# Patient Record
Sex: Female | Born: 1940 | Race: White | Hispanic: No | Marital: Married | Smoking: Never smoker
Health system: Southern US, Community
[De-identification: ages and names within clinical notes are randomized; demographics above are authoritative.]

## PROBLEM LIST (undated history)

## (undated) DIAGNOSIS — E7800 Pure hypercholesterolemia, unspecified: Secondary | ICD-10-CM

## (undated) DIAGNOSIS — Z8719 Personal history of other diseases of the digestive system: Secondary | ICD-10-CM

## (undated) DIAGNOSIS — I10 Essential (primary) hypertension: Secondary | ICD-10-CM

## (undated) DIAGNOSIS — IMO0002 Reserved for concepts with insufficient information to code with codable children: Secondary | ICD-10-CM

## (undated) DIAGNOSIS — A630 Anogenital (venereal) warts: Secondary | ICD-10-CM

## (undated) DIAGNOSIS — IMO0001 Reserved for inherently not codable concepts without codable children: Secondary | ICD-10-CM

## (undated) DIAGNOSIS — E039 Hypothyroidism, unspecified: Secondary | ICD-10-CM

## (undated) DIAGNOSIS — C50412 Malignant neoplasm of upper-outer quadrant of left female breast: Secondary | ICD-10-CM

## (undated) DIAGNOSIS — D34 Benign neoplasm of thyroid gland: Secondary | ICD-10-CM

## (undated) DIAGNOSIS — Z8489 Family history of other specified conditions: Secondary | ICD-10-CM

## (undated) DIAGNOSIS — H409 Unspecified glaucoma: Secondary | ICD-10-CM

## (undated) DIAGNOSIS — M8580 Other specified disorders of bone density and structure, unspecified site: Secondary | ICD-10-CM

## (undated) DIAGNOSIS — E559 Vitamin D deficiency, unspecified: Secondary | ICD-10-CM

## (undated) DIAGNOSIS — E785 Hyperlipidemia, unspecified: Secondary | ICD-10-CM

## (undated) DIAGNOSIS — T8859XA Other complications of anesthesia, initial encounter: Secondary | ICD-10-CM

## (undated) DIAGNOSIS — M7989 Other specified soft tissue disorders: Secondary | ICD-10-CM

## (undated) DIAGNOSIS — R7303 Prediabetes: Secondary | ICD-10-CM

## (undated) DIAGNOSIS — M899 Disorder of bone, unspecified: Secondary | ICD-10-CM

## (undated) DIAGNOSIS — M949 Disorder of cartilage, unspecified: Secondary | ICD-10-CM

## (undated) DIAGNOSIS — I4891 Unspecified atrial fibrillation: Secondary | ICD-10-CM

## (undated) DIAGNOSIS — R0602 Shortness of breath: Secondary | ICD-10-CM

## (undated) DIAGNOSIS — Z8669 Personal history of other diseases of the nervous system and sense organs: Secondary | ICD-10-CM

## (undated) DIAGNOSIS — Z923 Personal history of irradiation: Secondary | ICD-10-CM

## (undated) DIAGNOSIS — J309 Allergic rhinitis, unspecified: Secondary | ICD-10-CM

## (undated) DIAGNOSIS — Z972 Presence of dental prosthetic device (complete) (partial): Secondary | ICD-10-CM

## (undated) DIAGNOSIS — C801 Malignant (primary) neoplasm, unspecified: Secondary | ICD-10-CM

## (undated) DIAGNOSIS — D649 Anemia, unspecified: Secondary | ICD-10-CM

## (undated) DIAGNOSIS — L309 Dermatitis, unspecified: Secondary | ICD-10-CM

## (undated) DIAGNOSIS — K829 Disease of gallbladder, unspecified: Secondary | ICD-10-CM

## (undated) HISTORY — DX: Unspecified atrial fibrillation: I48.91

## (undated) HISTORY — DX: Anemia, unspecified: D64.9

## (undated) HISTORY — DX: Disease of gallbladder, unspecified: K82.9

## (undated) HISTORY — PX: OTHER SURGICAL HISTORY: SHX169

## (undated) HISTORY — DX: Pure hypercholesterolemia, unspecified: E78.00

## (undated) HISTORY — PX: DILATION AND CURETTAGE OF UTERUS: SHX78

## (undated) HISTORY — DX: Hyperlipidemia, unspecified: E78.5

## (undated) HISTORY — DX: Personal history of other diseases of the digestive system: Z87.19

## (undated) HISTORY — DX: Personal history of other diseases of the nervous system and sense organs: Z86.69

## (undated) HISTORY — PX: TUBAL LIGATION: SHX77

## (undated) HISTORY — PX: BREAST LUMPECTOMY: SHX2

## (undated) HISTORY — DX: Hypothyroidism, unspecified: E03.9

## (undated) HISTORY — DX: Allergic rhinitis, unspecified: J30.9

## (undated) HISTORY — DX: Unspecified glaucoma: H40.9

## (undated) HISTORY — DX: Shortness of breath: R06.02

## (undated) HISTORY — DX: Malignant neoplasm of upper-outer quadrant of left female breast: C50.412

## (undated) HISTORY — DX: Benign neoplasm of thyroid gland: D34

## (undated) HISTORY — DX: Vitamin D deficiency, unspecified: E55.9

## (undated) HISTORY — DX: Essential (primary) hypertension: I10

## (undated) HISTORY — DX: Disorder of cartilage, unspecified: M94.9

## (undated) HISTORY — DX: Other specified soft tissue disorders: M79.89

## (undated) HISTORY — DX: Disorder of bone, unspecified: M89.9

## (undated) HISTORY — PX: TONSILLECTOMY: SUR1361

## (undated) HISTORY — DX: Prediabetes: R73.03

## (undated) HISTORY — DX: Reserved for inherently not codable concepts without codable children: IMO0001

## (undated) HISTORY — DX: Reserved for concepts with insufficient information to code with codable children: IMO0002

---

## 1998-06-03 ENCOUNTER — Other Ambulatory Visit: Admission: RE | Admit: 1998-06-03 | Discharge: 1998-06-03 | Payer: Self-pay | Admitting: Gynecology

## 1998-11-26 HISTORY — PX: CHOLECYSTECTOMY: SHX55

## 1998-11-29 ENCOUNTER — Other Ambulatory Visit: Admission: RE | Admit: 1998-11-29 | Discharge: 1998-11-29 | Payer: Self-pay | Admitting: Gynecology

## 1999-12-27 ENCOUNTER — Encounter: Payer: Self-pay | Admitting: Gynecology

## 1999-12-27 ENCOUNTER — Encounter: Admission: RE | Admit: 1999-12-27 | Discharge: 1999-12-27 | Payer: Self-pay | Admitting: Gynecology

## 1999-12-28 ENCOUNTER — Other Ambulatory Visit: Admission: RE | Admit: 1999-12-28 | Discharge: 1999-12-28 | Payer: Self-pay | Admitting: Gynecology

## 2000-01-03 ENCOUNTER — Encounter: Admission: RE | Admit: 2000-01-03 | Discharge: 2000-01-03 | Payer: Self-pay | Admitting: Gynecology

## 2000-01-03 ENCOUNTER — Encounter: Payer: Self-pay | Admitting: Gynecology

## 2001-01-27 ENCOUNTER — Other Ambulatory Visit: Admission: RE | Admit: 2001-01-27 | Discharge: 2001-01-27 | Payer: Self-pay | Admitting: Gynecology

## 2001-02-13 ENCOUNTER — Encounter: Admission: RE | Admit: 2001-02-13 | Discharge: 2001-02-13 | Payer: Self-pay | Admitting: Gynecology

## 2001-02-13 ENCOUNTER — Encounter: Payer: Self-pay | Admitting: Gynecology

## 2002-02-02 ENCOUNTER — Other Ambulatory Visit: Admission: RE | Admit: 2002-02-02 | Discharge: 2002-02-02 | Payer: Self-pay | Admitting: Gynecology

## 2003-01-15 ENCOUNTER — Ambulatory Visit (HOSPITAL_BASED_OUTPATIENT_CLINIC_OR_DEPARTMENT_OTHER): Admission: RE | Admit: 2003-01-15 | Discharge: 2003-01-15 | Payer: Self-pay | Admitting: Gynecology

## 2003-01-15 ENCOUNTER — Encounter (INDEPENDENT_AMBULATORY_CARE_PROVIDER_SITE_OTHER): Payer: Self-pay | Admitting: Specialist

## 2003-03-18 ENCOUNTER — Encounter: Admission: RE | Admit: 2003-03-18 | Discharge: 2003-03-18 | Payer: Self-pay | Admitting: Gynecology

## 2003-03-18 ENCOUNTER — Encounter: Payer: Self-pay | Admitting: Gynecology

## 2003-09-08 ENCOUNTER — Other Ambulatory Visit: Admission: RE | Admit: 2003-09-08 | Discharge: 2003-09-08 | Payer: Self-pay | Admitting: Gynecology

## 2004-08-09 ENCOUNTER — Encounter: Admission: RE | Admit: 2004-08-09 | Discharge: 2004-08-09 | Payer: Self-pay | Admitting: Gynecology

## 2004-10-11 ENCOUNTER — Other Ambulatory Visit: Admission: RE | Admit: 2004-10-11 | Discharge: 2004-10-11 | Payer: Self-pay | Admitting: Gynecology

## 2004-12-15 ENCOUNTER — Ambulatory Visit: Payer: Self-pay | Admitting: Internal Medicine

## 2005-07-06 ENCOUNTER — Ambulatory Visit: Payer: Self-pay | Admitting: Internal Medicine

## 2005-09-07 ENCOUNTER — Ambulatory Visit: Payer: Self-pay | Admitting: Internal Medicine

## 2005-09-12 ENCOUNTER — Ambulatory Visit: Payer: Self-pay | Admitting: Internal Medicine

## 2005-09-20 ENCOUNTER — Encounter: Payer: Self-pay | Admitting: Internal Medicine

## 2005-11-07 ENCOUNTER — Encounter: Admission: RE | Admit: 2005-11-07 | Discharge: 2005-11-07 | Payer: Self-pay | Admitting: Gynecology

## 2005-11-12 ENCOUNTER — Other Ambulatory Visit: Admission: RE | Admit: 2005-11-12 | Discharge: 2005-11-12 | Payer: Self-pay | Admitting: Gynecology

## 2005-12-14 ENCOUNTER — Encounter: Admission: RE | Admit: 2005-12-14 | Discharge: 2005-12-14 | Payer: Self-pay | Admitting: Gynecology

## 2005-12-31 ENCOUNTER — Ambulatory Visit: Payer: Self-pay | Admitting: Internal Medicine

## 2006-07-10 ENCOUNTER — Ambulatory Visit: Payer: Self-pay | Admitting: Internal Medicine

## 2006-08-02 ENCOUNTER — Encounter: Admission: RE | Admit: 2006-08-02 | Discharge: 2006-08-02 | Payer: Self-pay | Admitting: Internal Medicine

## 2006-08-23 ENCOUNTER — Ambulatory Visit: Payer: Self-pay | Admitting: Internal Medicine

## 2006-11-13 ENCOUNTER — Other Ambulatory Visit: Admission: RE | Admit: 2006-11-13 | Discharge: 2006-11-13 | Payer: Self-pay | Admitting: Gynecology

## 2007-03-13 DIAGNOSIS — Z8719 Personal history of other diseases of the digestive system: Secondary | ICD-10-CM | POA: Insufficient documentation

## 2007-03-17 ENCOUNTER — Ambulatory Visit: Payer: Self-pay | Admitting: Internal Medicine

## 2007-03-17 LAB — CONVERTED CEMR LAB
BUN: 12 mg/dL (ref 6–23)
Chloride: 111 meq/L (ref 96–112)
Creatinine, Ser: 0.6 mg/dL (ref 0.4–1.2)
GFR calc Af Amer: 129 mL/min
GFR calc non Af Amer: 107 mL/min
Total CHOL/HDL Ratio: 4.6
VLDL: 22 mg/dL (ref 0–40)

## 2007-09-25 ENCOUNTER — Encounter: Admission: RE | Admit: 2007-09-25 | Discharge: 2007-09-25 | Payer: Self-pay | Admitting: Gynecology

## 2007-10-15 ENCOUNTER — Ambulatory Visit: Payer: Self-pay | Admitting: Internal Medicine

## 2007-10-15 DIAGNOSIS — I10 Essential (primary) hypertension: Secondary | ICD-10-CM

## 2007-10-15 DIAGNOSIS — R319 Hematuria, unspecified: Secondary | ICD-10-CM

## 2007-10-15 LAB — CONVERTED CEMR LAB: RBC / HPF: NONE SEEN (ref <3)

## 2007-10-16 ENCOUNTER — Encounter: Payer: Self-pay | Admitting: Internal Medicine

## 2007-10-17 ENCOUNTER — Encounter (INDEPENDENT_AMBULATORY_CARE_PROVIDER_SITE_OTHER): Payer: Self-pay | Admitting: *Deleted

## 2007-10-22 ENCOUNTER — Telehealth: Payer: Self-pay | Admitting: Internal Medicine

## 2007-10-28 ENCOUNTER — Telehealth (INDEPENDENT_AMBULATORY_CARE_PROVIDER_SITE_OTHER): Payer: Self-pay | Admitting: *Deleted

## 2007-10-29 ENCOUNTER — Ambulatory Visit: Payer: Self-pay | Admitting: Internal Medicine

## 2007-10-30 ENCOUNTER — Telehealth: Payer: Self-pay | Admitting: Internal Medicine

## 2007-10-31 ENCOUNTER — Ambulatory Visit: Payer: Self-pay | Admitting: Cardiology

## 2007-11-12 ENCOUNTER — Ambulatory Visit: Payer: Self-pay | Admitting: Internal Medicine

## 2007-11-12 LAB — CONVERTED CEMR LAB
Blood in Urine, dipstick: NEGATIVE
Nitrite: NEGATIVE
Protein, U semiquant: NEGATIVE
RBC / HPF: NONE SEEN (ref <3)
WBC, UA: NONE SEEN cells/hpf (ref <3)
pH: 6

## 2007-11-13 ENCOUNTER — Encounter: Payer: Self-pay | Admitting: Internal Medicine

## 2007-11-25 ENCOUNTER — Encounter (INDEPENDENT_AMBULATORY_CARE_PROVIDER_SITE_OTHER): Payer: Self-pay | Admitting: *Deleted

## 2007-12-19 ENCOUNTER — Ambulatory Visit: Payer: Self-pay | Admitting: Internal Medicine

## 2008-01-23 ENCOUNTER — Ambulatory Visit: Payer: Self-pay | Admitting: Internal Medicine

## 2008-01-23 ENCOUNTER — Telehealth (INDEPENDENT_AMBULATORY_CARE_PROVIDER_SITE_OTHER): Payer: Self-pay | Admitting: *Deleted

## 2008-01-26 ENCOUNTER — Emergency Department (HOSPITAL_COMMUNITY): Admission: EM | Admit: 2008-01-26 | Discharge: 2008-01-26 | Payer: Self-pay | Admitting: Family Medicine

## 2008-02-17 ENCOUNTER — Encounter: Payer: Self-pay | Admitting: Internal Medicine

## 2008-02-18 ENCOUNTER — Encounter: Payer: Self-pay | Admitting: Internal Medicine

## 2008-03-03 ENCOUNTER — Encounter: Payer: Self-pay | Admitting: Internal Medicine

## 2008-03-30 ENCOUNTER — Telehealth (INDEPENDENT_AMBULATORY_CARE_PROVIDER_SITE_OTHER): Payer: Self-pay | Admitting: *Deleted

## 2008-09-16 ENCOUNTER — Telehealth (INDEPENDENT_AMBULATORY_CARE_PROVIDER_SITE_OTHER): Payer: Self-pay | Admitting: *Deleted

## 2008-09-29 ENCOUNTER — Encounter: Admission: RE | Admit: 2008-09-29 | Discharge: 2008-09-29 | Payer: Self-pay | Admitting: Gynecology

## 2008-10-06 ENCOUNTER — Ambulatory Visit: Payer: Self-pay | Admitting: Internal Medicine

## 2009-05-09 ENCOUNTER — Telehealth (INDEPENDENT_AMBULATORY_CARE_PROVIDER_SITE_OTHER): Payer: Self-pay | Admitting: *Deleted

## 2009-05-18 ENCOUNTER — Ambulatory Visit: Payer: Self-pay | Admitting: Internal Medicine

## 2009-05-18 DIAGNOSIS — E669 Obesity, unspecified: Secondary | ICD-10-CM | POA: Insufficient documentation

## 2009-05-18 DIAGNOSIS — D126 Benign neoplasm of colon, unspecified: Secondary | ICD-10-CM

## 2009-05-31 ENCOUNTER — Telehealth (INDEPENDENT_AMBULATORY_CARE_PROVIDER_SITE_OTHER): Payer: Self-pay | Admitting: *Deleted

## 2009-06-10 ENCOUNTER — Ambulatory Visit: Payer: Self-pay | Admitting: Internal Medicine

## 2009-06-14 ENCOUNTER — Telehealth (INDEPENDENT_AMBULATORY_CARE_PROVIDER_SITE_OTHER): Payer: Self-pay | Admitting: *Deleted

## 2009-06-14 LAB — CONVERTED CEMR LAB
Basophils Absolute: 0.1 10*3/uL (ref 0.0–0.1)
Basophils Relative: 1.3 % (ref 0.0–3.0)
CO2: 28 meq/L (ref 19–32)
Chloride: 111 meq/L (ref 96–112)
Direct LDL: 155.2 mg/dL
Eosinophils Absolute: 0.1 10*3/uL (ref 0.0–0.7)
Eosinophils Relative: 1.7 % (ref 0.0–5.0)
GFR calc non Af Amer: 88.42 mL/min (ref >60)
Hemoglobin: 11 g/dL — ABNORMAL LOW (ref 12.0–15.0)
MCHC: 34.3 g/dL (ref 30.0–36.0)
MCV: 90.8 fL (ref 78.0–100.0)
Neutro Abs: 2.3 10*3/uL (ref 1.4–7.7)
Neutrophils Relative %: 54.7 % (ref 43.0–77.0)
Platelets: 157 10*3/uL (ref 150.0–400.0)
RBC: 3.54 M/uL — ABNORMAL LOW (ref 3.87–5.11)
Triglycerides: 82 mg/dL (ref 0.0–149.0)

## 2009-06-15 ENCOUNTER — Ambulatory Visit: Payer: Self-pay | Admitting: Internal Medicine

## 2009-06-22 ENCOUNTER — Telehealth: Payer: Self-pay | Admitting: Internal Medicine

## 2009-07-01 ENCOUNTER — Encounter (INDEPENDENT_AMBULATORY_CARE_PROVIDER_SITE_OTHER): Payer: Self-pay | Admitting: *Deleted

## 2009-07-01 LAB — CONVERTED CEMR LAB
Iron: 45 ug/dL (ref 42–145)
Vitamin B-12: 351 pg/mL (ref 211–911)

## 2009-07-25 ENCOUNTER — Ambulatory Visit: Payer: Self-pay | Admitting: Internal Medicine

## 2009-08-05 ENCOUNTER — Telehealth: Payer: Self-pay | Admitting: Internal Medicine

## 2009-08-08 ENCOUNTER — Telehealth: Payer: Self-pay | Admitting: Internal Medicine

## 2009-08-09 ENCOUNTER — Ambulatory Visit: Payer: Self-pay | Admitting: Internal Medicine

## 2009-08-11 ENCOUNTER — Encounter: Payer: Self-pay | Admitting: Internal Medicine

## 2009-08-11 ENCOUNTER — Encounter (INDEPENDENT_AMBULATORY_CARE_PROVIDER_SITE_OTHER): Payer: Self-pay | Admitting: *Deleted

## 2009-08-11 ENCOUNTER — Telehealth (INDEPENDENT_AMBULATORY_CARE_PROVIDER_SITE_OTHER): Payer: Self-pay | Admitting: *Deleted

## 2009-09-02 ENCOUNTER — Ambulatory Visit: Payer: Self-pay | Admitting: Family Medicine

## 2009-09-28 ENCOUNTER — Ambulatory Visit: Payer: Self-pay | Admitting: Internal Medicine

## 2009-09-28 DIAGNOSIS — M949 Disorder of cartilage, unspecified: Secondary | ICD-10-CM

## 2009-09-28 DIAGNOSIS — M899 Disorder of bone, unspecified: Secondary | ICD-10-CM | POA: Insufficient documentation

## 2009-10-03 LAB — CONVERTED CEMR LAB
AST: 28 units/L (ref 0–37)
Cholesterol: 232 mg/dL — ABNORMAL HIGH (ref 0–200)
Direct LDL: 170.7 mg/dL
HDL: 55.9 mg/dL (ref >39.00)
Total CHOL/HDL Ratio: 4
Triglycerides: 74 mg/dL (ref 0.0–149.0)
VLDL: 14.8 mg/dL (ref 0.0–40.0)

## 2009-10-04 ENCOUNTER — Encounter: Payer: Self-pay | Admitting: Internal Medicine

## 2009-10-06 ENCOUNTER — Encounter: Payer: Self-pay | Admitting: Internal Medicine

## 2009-10-06 ENCOUNTER — Ambulatory Visit: Payer: Self-pay | Admitting: Internal Medicine

## 2009-10-25 ENCOUNTER — Encounter: Admission: RE | Admit: 2009-10-25 | Discharge: 2009-10-25 | Payer: Self-pay | Admitting: Gynecology

## 2009-10-26 ENCOUNTER — Encounter: Payer: Self-pay | Admitting: Internal Medicine

## 2009-10-27 ENCOUNTER — Encounter: Payer: Self-pay | Admitting: Internal Medicine

## 2009-11-01 ENCOUNTER — Encounter (INDEPENDENT_AMBULATORY_CARE_PROVIDER_SITE_OTHER): Payer: Self-pay | Admitting: *Deleted

## 2009-11-02 ENCOUNTER — Encounter: Admission: RE | Admit: 2009-11-02 | Discharge: 2009-11-02 | Payer: Self-pay | Admitting: Gynecology

## 2009-11-24 ENCOUNTER — Telehealth: Payer: Self-pay | Admitting: Family Medicine

## 2010-07-20 ENCOUNTER — Encounter (INDEPENDENT_AMBULATORY_CARE_PROVIDER_SITE_OTHER): Payer: Self-pay | Admitting: *Deleted

## 2010-08-18 ENCOUNTER — Encounter (INDEPENDENT_AMBULATORY_CARE_PROVIDER_SITE_OTHER): Payer: Self-pay

## 2010-08-21 ENCOUNTER — Encounter (INDEPENDENT_AMBULATORY_CARE_PROVIDER_SITE_OTHER): Payer: Self-pay | Admitting: *Deleted

## 2010-08-22 ENCOUNTER — Ambulatory Visit: Payer: Self-pay | Admitting: Internal Medicine

## 2010-09-01 ENCOUNTER — Encounter: Payer: Self-pay | Admitting: Internal Medicine

## 2010-09-05 ENCOUNTER — Ambulatory Visit: Payer: Self-pay | Admitting: Internal Medicine

## 2010-09-07 ENCOUNTER — Encounter: Payer: Self-pay | Admitting: Internal Medicine

## 2010-12-24 LAB — CONVERTED CEMR LAB
ALT: 20 units/L (ref 0–35)
Albumin: 3.8 g/dL (ref 3.5–5.2)
Alkaline Phosphatase: 82 units/L (ref 39–117)
BUN: 15 mg/dL (ref 6–23)
Bilirubin Urine: NEGATIVE
Blood in Urine, dipstick: NEGATIVE
CO2: 28 meq/L (ref 19–32)
Chloride: 108 meq/L (ref 96–112)
Eosinophils Absolute: 0.1 10*3/uL (ref 0.0–0.6)
Eosinophils Relative: 1.5 % (ref 0.0–5.0)
Glucose, Bld: 102 mg/dL — ABNORMAL HIGH (ref 70–99)
Glucose, Urine, Semiquant: NEGATIVE
Glucose, Urine, Semiquant: NEGATIVE
Hemoglobin: 12.5 g/dL (ref 12.0–15.0)
Hemoglobin: 13.7 g/dL
Ketones, urine, test strip: NEGATIVE
Ketones, urine, test strip: NEGATIVE
Lymphocytes Relative: 34.8 % (ref 12.0–46.0)
MCHC: 35.2 g/dL (ref 30.0–36.0)
Monocytes Relative: 6.3 % (ref 3.0–11.0)
Platelets: 176 10*3/uL (ref 150–400)
Potassium: 5 meq/L (ref 3.5–5.1)
Protein, U semiquant: NEGATIVE
RDW: 13 % (ref 11.5–14.6)
Total Bilirubin: 0.9 mg/dL (ref 0.3–1.2)
WBC Urine, dipstick: NEGATIVE

## 2010-12-26 NOTE — Letter (Signed)
Summary: Previsit letter  [facility]  [address] [place], [place] [address]   Phone: [phone]  Fax: [phone]       07/20/2010 MRN: [id number]  [facility] [address] [place], [place]  [address]  Dear Ms. Brooke Dare,  Welcome to the Gastroenterology Division at [facility].    You are scheduled to see a nurse for your pre-procedure visit on 08/24/2010 at 4:00PM on the 3rd floor at [facility], [address]  We ask that you try to arrive at our office 15 minutes prior to your appointment time to allow for check-in.  Your nurse visit will consist of discussing your medical and surgical history, your immediate family medical history, and your medications.    Please bring a complete list of all your medications or, if you prefer, bring the medication bottles and we will list them.  We will need to be aware of both prescribed and over the counter drugs.  We will need to know exact dosage information as well.  If you are on blood thinners (Coumadin, Plavix, Aggrenox, Ticlid, etc.) please call our office today/prior to your appointment, as we need to consult with your physician about holding your medication.   Please be prepared to read and sign documents such as consent forms, a financial agreement, and acknowledgement forms.  If necessary, and with your consent, a friend or relative is welcome to sit-in on the nurse visit with you.  Please bring your insurance card so that we may make a copy of it.  If your insurance requires a referral to see a specialist, please bring your referral form from your primary care physician.  No co-pay is required for this nurse visit.     If you cannot keep your appointment, please call [phone] to cancel or reschedule prior to your appointment date.  This allows [name] the opportunity to schedule an appointment for another patient in need of care.    Thank you for choosing [facility] for your medical  needs.  We appreciate the opportunity to care for you.  Please visit [name] at our website  to learn more about our practice.                     Sincerely.                                                                                                                   The Gastroenterology Division

## 2010-12-26 NOTE — Letter (Signed)
Summary: Patient Notice- Polyp Results  [facility]  [address] [place], [place] [address]   Phone: [phone]  Fax: [phone]        September 07, 2010 MRN: [id number]    [facility] [address] [place], [place]  [address]    Dear Ms. Murata,  I am pleased to inform you that the colon polyp(s) removed during your recent colonoscopy was (were) found to be benign (no cancer detected) upon pathologic examination.  I recommend you have a repeat colonoscopy examination in 5 years to look for recurrent polyps, as having colon polyps increases your risk for having recurrent polyps or even colon cancer in the future.  Should you develop new or worsening symptoms of abdominal pain, bowel habit changes or bleeding from the rectum or bowels, please schedule an evaluation with either your primary care physician or with me.    Additional information/recommendations:  __ No further action with gastroenterology is needed at this time. Please      follow-up with your primary care physician for your other healthcare      needs.   Please call us if you are having persistent problems or have questions about your condition that have not been fully answered at this time.  Sincerely,  Hilarie Fredrickson MD  This letter has been electronically signed by your physician.  Appended Document: Patient Notice- Polyp Results letter mailed

## 2010-12-26 NOTE — Procedures (Signed)
Summary: Colonoscopy  Patient: Lori Joseph Note: All result statuses are Final unless otherwise noted.  Tests: (1) Colonoscopy (COL)   COL Colonoscopy           DONE     [facility]     [address]     [place], [place]  [address]           COLONOSCOPY PROCEDURE REPORT           PATIENT:  [name], [name]  MR#:  [id number]     BIRTHDATE:  1941/05/22, 69 yrs. old  GENDER:  female     ENDOSCOPIST:  Wilhemina Bonito. Eda Keys, MD     REF. BY:  Surveillance Program Recall,     PROCEDURE DATE:  09/05/2010     PROCEDURE:  Colonoscopy with snare polypectomy x 2     ASA CLASS:  Class II     INDICATIONS:  history of pre-cancerous (adenomatous) colon polyps,     surveillance and high-risk screening ; Negative colonoscopy 2004;     2 small adenomas 01-2008 (Dr. Noe Gens in [place] - told to follow     up now)     MEDICATIONS:   Fentanyl 100 mcg IV, Versed 10 mg IV, Benadryl 25     mg IV           DESCRIPTION OF PROCEDURE:   After the risks benefits and     alternatives of the procedure were thoroughly explained, informed     consent was obtained.  Digital rectal exam was performed and     revealed no abnormalities.   The LB 180AL [id number] endoscope was     introduced through the anus and advanced to the cecum, which was     identified by both the appendix and ileocecal valve, without     limitations.Time to cecum = 3:58 min. The quality of the prep was     excellent, using MoviPrep.  The instrument was then slowly     withdrawn (time = 10:28 min) as the colon was fully examined.     <<PROCEDUREIMAGES>>           FINDINGS:  A diminutive polyp was found in the cecum. Polyp was     snared without cautery. Retrieval was successful.   A diminutive     polyp was found in the ascending colon. Polyp was snared without     cautery. Retrieval was successful.  Moderate diverticulosis was     found in the left colon.   Retroflexed views in the rectum     revealed no abnormalities.    The scope was then  withdrawn from     the patient and the procedure completed.           COMPLICATIONS:  None           ENDOSCOPIC IMPRESSION:     1) Diminutive polyp in the cecum - removed     2) Diminutive polyp in the ascending colon - removed     3) Moderate diverticulosis in the left colon     RECOMMENDATIONS:     1) Follow up colonoscopy in 5 years           ______________________________     Wilhemina Bonito. Eda Keys, MD           CC:  Willow Ora, MD;  The Patient           n.     eSIGNED:  Wilhemina Bonito. Eda Keys at 09/05/2010 01:25 PM           Nadeen Landau, [id number]  Note: An exclamation mark (!) indicates a result that was not dispersed into the flowsheet. Document Creation Date: 09/05/2010 1:25 PM _______________________________________________________________________  (1) Order result status: Final Collection or observation date-time: 09/05/2010 13:17 Requested date-time:  Receipt date-time:  Reported date-time:  Referring Physician:   Ordering Physician: Fransico Setters [phone]) Specimen Source:  Source: Launa Grill Order Number: [id number] Lab site:   Appended Document: Colonoscopy     Procedures Next Due Date:    Colonoscopy: 08/2015

## 2010-12-26 NOTE — Miscellaneous (Signed)
Summary: flu shot @ [facility]   Immunization History:  Influenza Immunization History:    Influenza:  given @ [facility]  (08/22/2010)

## 2010-12-26 NOTE — Letter (Signed)
Summary: Moviprep Instructions  [facility]  [address]   [place], [place] [address]   Phone: [phone]  Fax: [phone]       Lori Joseph    [birth year]    MRN: [id number]        Procedure Day Dorna Bloom: Tuesday, 09-05-10     Arrival Time: 8:30 a.m.      Procedure Time: 9:30 a.m.     Location of Procedure:                     x  [facility] (4th Floor)                        PREPARATION FOR COLONOSCOPY WITH MOVIPREP   Starting 5 days prior to your procedure 08-31-10 do not eat nuts, seeds, popcorn, corn, beans, peas,  salads, or any raw vegetables.  Do not take any fiber supplements (e.g. Metamucil, Citrucel, and Benefiber).  THE DAY BEFORE YOUR PROCEDURE         DATE:  09-04-10   DAY: Monday  1.  Drink clear liquids the entire day-NO SOLID FOOD  2.  Do not drink anything colored red or purple.  Avoid juices with pulp.  No orange juice.  3.  Drink at least 64 oz. (8 glasses) of fluid/clear liquids during the day to prevent dehydration and help the prep work efficiently.  CLEAR LIQUIDS INCLUDE: Water Jello Ice Popsicles Tea (sugar ok, no milk/cream) Powdered fruit flavored drinks Coffee (sugar ok, no milk/cream) Gatorade Juice: apple, white grape, white cranberry  Lemonade Clear bullion, consomm, broth Carbonated beverages (any kind) Strained chicken noodle soup Hard Candy                             4.  In the morning, mix first dose of MoviPrep solution:    Empty 1 Pouch A and 1 Pouch B into the disposable container    Add lukewarm drinking water to the top line of the container. Mix to dissolve    Refrigerate (mixed solution should be used within 24 hrs)  5.  Begin drinking the prep at 5:00 p.m. The MoviPrep container is divided by 4 marks.   Every 15 minutes drink the solution down to the next mark (approximately 8 oz) until the full liter is complete.   6.  Follow completed prep with 16 oz of clear liquid of your choice  (Nothing red or purple).  Continue to drink clear liquids until bedtime.  7.  Before going to bed, mix second dose of MoviPrep solution:    Empty 1 Pouch A and 1 Pouch B into the disposable container    Add lukewarm drinking water to the top line of the container. Mix to dissolve    Refrigerate  THE DAY OF YOUR PROCEDURE      DATE: 09-05-10  DAY: Tuesday  Beginning at 4:30 a.m. (5 hours before procedure):         1. Every 15 minutes, drink the solution down to the next mark (approx 8 oz) until the full liter is complete.  2. Follow completed prep with 16 oz. of clear liquid of your choice.    3. You may drink clear liquids until 7:30 a.m.  (2 HOURS BEFORE PROCEDURE).   MEDICATION INSTRUCTIONS  Unless otherwise instructed, you should take regular prescription medications with a small sip of water   as early  as possible the morning of your procedure.  Diabetic patients - see separate instructions.  Stop taking Plavix or Aggrenox on  _  _  (7 days before procedure).     Stop taking Coumadin on  _ _  (5 days before procedure).  Additional medication instructions: _         OTHER INSTRUCTIONS  You will need a responsible adult at least 69 years of age to accompany you and drive you home.   This person must remain in the waiting room during your procedure.  Wear loose fitting clothing that is easily removed.  Leave jewelry and other valuables at home.  However, you may wish to bring a book to read or  an iPod/MP3 player to listen to music as you wait for your procedure to start.  Remove all body piercing jewelry and leave at home.  Total time from sign-in until discharge is approximately 2-3 hours.  You should go home directly after your procedure and rest.  You can resume normal activities the  day after your procedure.  The day of your procedure you should not:   Drive   Make legal decisions   Operate machinery   Drink alcohol   Return to work  You will  receive specific instructions about eating, activities and medications before you leave.    The above instructions have been reviewed and explained to me by   _______________________    I fully understand and can verbalize these instructions _____________________________ Date _________   Appended Document: Moviprep Instructions This prep was put in in error

## 2010-12-26 NOTE — Letter (Signed)
Summary: Moviprep Instructions  [facility]  [address]   [place], [place] [address]   Phone: [phone]  Fax: [phone]       Lori Joseph    1941-04-11    MRN: [id number]        Procedure Day Dorna Bloom: Tuesday, 09-05-10     Arrival Time: 10:30 a.m.      Procedure Time: 11:30 a.m.     Location of Procedure:                    x   [facility] (4th Floor)                        PREPARATION FOR COLONOSCOPY WITH MOVIPREP   Starting 5 days prior to your procedure 08-31-10 do not eat nuts, seeds, popcorn, corn, beans, peas,  salads, or any raw vegetables.  Do not take any fiber supplements (e.g. Metamucil, Citrucel, and Benefiber).  THE DAY BEFORE YOUR PROCEDURE         DATE: 09-04-10   DAY: Monday  1.  Drink clear liquids the entire day-NO SOLID FOOD  2.  Do not drink anything colored red or purple.  Avoid juices with pulp.  No orange juice.  3.  Drink at least 64 oz. (8 glasses) of fluid/clear liquids during the day to prevent dehydration and help the prep work efficiently.  CLEAR LIQUIDS INCLUDE: Water Jello Ice Popsicles Tea (sugar ok, no milk/cream) Powdered fruit flavored drinks Coffee (sugar ok, no milk/cream) Gatorade Juice: apple, white grape, white cranberry  Lemonade Clear bullion, consomm, broth Carbonated beverages (any kind) Strained chicken noodle soup Hard Candy                             4.  In the morning, mix first dose of MoviPrep solution:    Empty 1 Pouch A and 1 Pouch B into the disposable container    Add lukewarm drinking water to the top line of the container. Mix to dissolve    Refrigerate (mixed solution should be used within 24 hrs)  5.  Begin drinking the prep at 5:00 p.m. The MoviPrep container is divided by 4 marks.   Every 15 minutes drink the solution down to the next mark (approximately 8 oz) until the full liter is complete.   6.  Follow completed prep with 16 oz of clear liquid of your choice  (Nothing red or purple).  Continue to drink clear liquids until bedtime.  7.  Before going to bed, mix second dose of MoviPrep solution:    Empty 1 Pouch A and 1 Pouch B into the disposable container    Add lukewarm drinking water to the top line of the container. Mix to dissolve    Refrigerate  THE DAY OF YOUR PROCEDURE      DATE: 09-05-10  DAY: Tuesday  Beginning at 6:30 a.m. (5 hours before procedure):         1. Every 15 minutes, drink the solution down to the next mark (approx 8 oz) until the full liter is complete.  2. Follow completed prep with 16 oz. of clear liquid of your choice.    3. You may drink clear liquids until 9:30 a.m. (2 HOURS BEFORE PROCEDURE).   MEDICATION INSTRUCTIONS  Unless otherwise instructed, you should take regular prescription medications with a small sip of water   as early as possible  the morning of your procedure.         OTHER INSTRUCTIONS  You will need a responsible adult at least 70 years of age to accompany you and drive you home.   This person must remain in the waiting room during your procedure.  Wear loose fitting clothing that is easily removed.  Leave jewelry and other valuables at home.  However, you may wish to bring a book to read or  an iPod/MP3 player to listen to music as you wait for your procedure to start.  Remove all body piercing jewelry and leave at home.  Total time from sign-in until discharge is approximately 2-3 hours.  You should go home directly after your procedure and rest.  You can resume normal activities the  day after your procedure.  The day of your procedure you should not:   Drive   Make legal decisions   Operate machinery   Drink alcohol   Return to work  You will receive specific instructions about eating, activities and medications before you leave.    The above instructions have been reviewed and explained to me by   Ulis Rias RN  August 22, 2010 10:22 AM     I fully  understand and can verbalize these instructions _____________________________ Date _________

## 2010-12-26 NOTE — Miscellaneous (Signed)
Summary: Lec previsit  Clinical Lists Changes  Medications: Added new medication of MOVIPREP 100 GM  SOLR (PEG-KCL-NACL-NASULF-NA ASC-C) As per prep instructions. - Signed Rx of MOVIPREP 100 GM  SOLR (PEG-KCL-NACL-NASULF-NA ASC-C) As per prep instructions.;  #1 x 0;  Signed;  Entered by: Ulis Rias RN;  Authorized by: Hilarie Fredrickson MD;  Method used: Electronically to [facility]  [address] [id number] [address] [place], [place]  [address], Ph: [phone] or [phone], Fax: [phone] Observations: Added new observation of ALLERGY REV: Done (08/22/2010 9:55)    Prescriptions: MOVIPREP 100 GM  SOLR (PEG-KCL-NACL-NASULF-NA ASC-C) As per prep instructions.  #1 x 0   Entered by:   Ulis Rias RN   Authorized by:   Hilarie Fredrickson MD   Signed by:   Ulis Rias RN on 08/22/2010   Method used:   Electronically to        [facility]  [address] [id number] (retail)       [address]       [place], [place]  [address]       Ph: [phone] or [phone]       Fax: [phone]   RxID:   [id number]

## 2011-01-12 ENCOUNTER — Encounter: Payer: Self-pay | Admitting: Internal Medicine

## 2011-01-12 ENCOUNTER — Other Ambulatory Visit: Payer: Self-pay | Admitting: Internal Medicine

## 2011-01-12 ENCOUNTER — Encounter (INDEPENDENT_AMBULATORY_CARE_PROVIDER_SITE_OTHER): Payer: Medicare Other | Admitting: Internal Medicine

## 2011-01-12 DIAGNOSIS — I10 Essential (primary) hypertension: Secondary | ICD-10-CM

## 2011-01-12 DIAGNOSIS — E785 Hyperlipidemia, unspecified: Secondary | ICD-10-CM | POA: Insufficient documentation

## 2011-01-12 DIAGNOSIS — J069 Acute upper respiratory infection, unspecified: Secondary | ICD-10-CM | POA: Insufficient documentation

## 2011-01-12 DIAGNOSIS — Z0000 Encounter for general adult medical examination without abnormal findings: Secondary | ICD-10-CM

## 2011-01-12 DIAGNOSIS — M899 Disorder of bone, unspecified: Secondary | ICD-10-CM

## 2011-01-12 LAB — CBC WITH DIFFERENTIAL/PLATELET
Basophils Absolute: 0 10*3/uL (ref 0.0–0.1)
Basophils Relative: 0.5 % (ref 0.0–3.0)
Eosinophils Relative: 1.6 % (ref 0.0–5.0)
HCT: 35.9 % — ABNORMAL LOW (ref 36.0–46.0)
Lymphocytes Relative: 35.3 % (ref 12.0–46.0)
Lymphs Abs: 1.6 10*3/uL (ref 0.7–4.0)
MCV: 91.5 fl (ref 78.0–100.0)
Monocytes Relative: 5.9 % (ref 3.0–12.0)
Neutro Abs: 2.5 10*3/uL (ref 1.4–7.7)
Platelets: 161 10*3/uL (ref 150.0–400.0)
WBC: 4.5 10*3/uL (ref 4.5–10.5)

## 2011-01-12 LAB — CONVERTED CEMR LAB: Vit D, 25-Hydroxy: 36 ng/mL (ref 30–89)

## 2011-01-12 LAB — LIPID PANEL: VLDL: 26.2 mg/dL (ref 0.0–40.0)

## 2011-01-12 LAB — ALT: ALT: 19 U/L (ref 0–35)

## 2011-01-12 LAB — AST: AST: 20 U/L (ref 0–37)

## 2011-01-12 LAB — BASIC METABOLIC PANEL
BUN: 18 mg/dL (ref 6–23)
CO2: 29 mEq/L (ref 19–32)
Chloride: 108 mEq/L (ref 96–112)
Glucose, Bld: 96 mg/dL (ref 70–99)
Potassium: 4.6 mEq/L (ref 3.5–5.1)

## 2011-01-17 NOTE — Assessment & Plan Note (Signed)
Summary: cpx, shingles shot/cbs   Vital Signs:  Patient profile:   70 year old female Height:      64 inches Weight:      228 pounds BMI:     39.28 Temp:     98.4 degrees F oral Pulse rate:   82 / minute Pulse rhythm:   regular BP sitting:   130 / 88  (left arm) Cuff size:   large  Vitals Entered By: Army Fossa CMA (January 12, 2011 8:42 AM) CC: CPX, fasting  Comments woke up this am w/ a sore throat, head congestion [facility] [address] not had mammo or pap- schedule w/ a gyn     History of Present Illness: Here for Medicare AWV:  1.   Risk factors based on Past M, S, F history: reviewed 2.   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   Depression/mood: no problems noted or reported  4.   Hearing: no problems noted or reported  5.   ADL's: independent  6.   Fall Risk: no recent falls  7.   Home Safety: does feel safe at home  8.   Height, weight, &visual acuity: see VS, sees ophtalmology q 4 months , has several eye issues  9.   Counseling: yes 10.   Labs ordered based on risk factors: yes 11.           Referral Coordination, if needed 12.           Care Plan, see assessment and plan 13.            Cognitive Assessment:   motor skills, cognition and memory seem normal  in addition, we discussed the following  Hypertension-- good medication compliance , good ambulatory BPs   used to see Dr Margo Aye   yearly d/t FH skin cancer --- likes a referal to a different dermatologist, has a lesion in the abdomen that she likes to be looked at   woke up this am w/ a sore throat, head congestion, postnasal dripping with yellow phlegm    Preventive Screening-Counseling & Management  Caffeine-Diet-Exercise     Does Patient Exercise: no  Current Medications (verified): 1)  Aspirin 81 Mg  Chew (Aspirin) 2)  Cosopt 2-0.5 % Soln (Dorzolamide-Timolol) .Marland Kitchen.. 1 Gtt Ou Two Times A Day 3)  Benicar 40 Mg Tabs (Olmesartan Medoxomil) .Marland Kitchen.. 1 By Mouth Once  Daily 4)  Lumigan 0.01 % Soln (Bimatoprost) .Marland Kitchen.. 1 Drop Both Eyes At Bedtime.  Allergies (verified): 1)  ! Codeine 2)  ! * Lisinopril 3)  ! Losartan Potassium (Losartan Potassium) 4)  ! Coreg 5)  ! 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 CV:  Denies chest pain or discomfort and palpitations. Resp:  Denies cough and wheezing. GI:  Denies bloody stools, diarrhea, nausea, and vomiting. GU:  Denies dysuria and hematuria.  Physical Exam  General:  alert, well-developed, and overweight-appearing.   Head:  face symmetric, not tender to palpation Ears:  R ear normal and L ear normal.   Nose:  slightly congested Mouth:  slightly red, no discharge. Uvula midline Neck:  no masses and no thyromegaly.   Lungs:  normal respiratory effort, no intercostal retractions, no accessory muscle use, and normal breath sounds.   Heart:  normal rate, regular rhythm, no murmur, and no gallop.   Abdomen:  soft, non-tender, no distention, no masses, no guarding, and no rigidity.   Extremities:  no pretibial edema bilaterally  Skin:  has a 3x2 mm skin lesion in the mid abdomen, is salmon color, slightly elevated. Psych:  Oriented X3, good eye contact, not anxious  appearing, and not depressed appearing.     Impression & Recommendations:  Problem # 1:  HEALTH SCREENING (ICD-V70.0) Td 2008 pneumonia shot 2008 Shingles immunization---->  prescription provided had a Flu shot already   due to see  Dr. [name] and to have a MMG , had a breast aspiration last year per pt --- strongly rec to schedule F/Us  diet-exercise discussed   Cscope @ [facility] 01-2008 2 polyps ----> tubular adenomas w/  low grade dysplasia colonoscopy 08/2010, very small polyps, next 5 years  has a family history of melanoma, likes to switch to another dermatologist, lesion  in the abdomen with no worrisome features. Referred to Dr. Terri Piedra    Orders: Dermatology Referral [facility]) Medicare -1st Annual Wellness Visit [phone])  Problem # 2:  OSTEOPENIA (ICD-733.90) DEXA: 2006, T score -1.2 osteopenia DEXA 09-2009 normal Vitamin D is slightly low 09-2009 plan:  exercise, recheck a vitamin D   Orders: Venipuncture (11914) T-Vitamin D (25-Hydroxy) (78295-62130)  Problem # 3:  URI (ICD-465.9) Assessment: New    strep test negative, instructions  Orders: Rapid Strep (86578)  Problem # 4:  HYPERLIPIDEMIA (ICD-272.4) elevated LDL to 170. her brother had MI at a  early age although he reportedly  "did drugs" and patient thinks MI related to that issue  Labs EKG normal, no change from previous EKG diet, exercise encouraged LDL goal 130 or less   Labs Reviewed: SGOT: 28 (09/28/2009)   SGPT: 30 (09/28/2009)   HDL:55.90 (09/28/2009), 52.20 (06/10/2009)  LDL:DEL (03/17/2007)  Chol:232 (09/28/2009), 218 (06/10/2009)  Trig:74.0 (09/28/2009), 82.0 (06/10/2009)  Orders: TLB-Lipid Panel (80061-LIPID) TLB-ALT (SGPT) (84460-ALT) TLB-AST (SGOT) (84450-SGOT) EKG w/ Interpretation (93000)  Problem # 5:  HYPERTENSION (ICD-401.9)  at goal  Her updated medication list for this problem includes:    Benicar 40 Mg Tabs (Olmesartan medoxomil) .Marland Kitchen... 1 by mouth once daily    BP  today: 130/88 Prior BP: 156/90 (09/28/2009)  Labs Reviewed: K+: 4.4 (06/10/2009) Creat: : 0.7 (06/10/2009)   Chol: 232 (09/28/2009)   HDL: 55.90 (09/28/2009)   LDL: DEL (03/17/2007)   TG: 74.0 (09/28/2009)  Orders: TLB-BMP (Basic Metabolic Panel-BMET) (80048-METABOL) TLB-CBC Platelet - w/Differential (85025-CBCD) EKG w/ Interpretation (93000) Specimen Handling (46962)  Complete Medication List: 1)  Aspirin 81 Mg Chew (Aspirin) 2)  Cosopt 2-0.5 % Soln (Dorzolamide-timolol) .Marland Kitchen.. 1 gtt ou two times a day 3)  Lumigan 0.01 % Soln (Bimatoprost) .Marland Kitchen.. 1 drop both eyes at bedtime. 4)  Benicar 40 Mg Tabs (Olmesartan medoxomil) .Marland Kitchen.. 1 by mouth once daily 5)  Zostavax 95284 Unt/0.85ml Solr (Zoster vaccine live) .Marland Kitchen.. 1 s.q.  Patient Instructions: 1)  rest, fluids, Tylenol 2)  If you develop cough, take Robitussin-DM. 3)  Call if not better in a few days. 4)  Please schedule a follow-up appointment in 6 months . fasting, regular checkup. Prescriptions: ZOSTAVAX 04540 UNT/0.65ML SOLR (ZOSTER VACCINE LIVE) 1 s.q.  #1 x 0   Entered and Authorized by:   Nolon Rod. Paz MD   Signed by:   Nolon Rod. Paz MD on 01/12/2011   Method used:   Print then Give to Patient   RxID:   [id number]    Orders Added: 1)  Venipuncture [36415] 2)  T-Vitamin D (25-Hydroxy) [08657-84696] 3)  TLB-Lipid Panel [80061-LIPID] 4)  TLB-ALT (SGPT) [84460-ALT] 5)  TLB-AST (SGOT) [84450-SGOT] 6)  TLB-BMP (Basic Metabolic Panel-BMET) [80048-METABOL] 7)  TLB-CBC Platelet - w/Differential [85025-CBCD] 8)  EKG w/ Interpretation [93000] 9)  Rapid Strep [29528] 10)  EKG w/ Interpretation [93000] 11)  Specimen Handling [99000] 12)  Dermatology Referral [Derma] 13)  Est. Patient Level III [41324] 14)  Medicare -1st Annual Wellness Visit [G0438]      Risk Factors:  Exercise:  no   Laboratory Results    Other Tests  Rapid Strep: negative

## 2011-02-10 ENCOUNTER — Encounter: Payer: Self-pay | Admitting: Family Medicine

## 2011-02-10 ENCOUNTER — Ambulatory Visit (INDEPENDENT_AMBULATORY_CARE_PROVIDER_SITE_OTHER): Payer: Medicare Other | Admitting: Family Medicine

## 2011-02-10 DIAGNOSIS — K1370 Unspecified lesions of oral mucosa: Secondary | ICD-10-CM | POA: Insufficient documentation

## 2011-02-10 DIAGNOSIS — J019 Acute sinusitis, unspecified: Secondary | ICD-10-CM

## 2011-02-13 NOTE — Assessment & Plan Note (Signed)
Summary: SORE IN TOP OF MOUTH/RCD   Vital Signs:  Patient profile:   70 year old female Weight:      226.75 pounds Temp:     98.1 degrees F oral Pulse rate:   68 / minute Pulse rhythm:   regular BP sitting:   148 / 88  (left arm) Cuff size:   large  Vitals Entered By: Selena Batten Dance CMA (AAMA) (February 10, 2011 10:02 AM) CC: sore in top of mouth, URI symptoms   History of Present Illness:       This is a 70 year old woman who presents with URI symptoms.  The symptoms began 3 weeks ago.  pt using mucinex with no relief.  She also has a sore in her mouth.  The patient complains of nasal congestion, purulent nasal discharge, sore throat, productive cough, and sick contacts.  The patient denies fever, low-grade fever (<100.5 degrees), fever of 100.5-103 degrees, fever of 103.1-104 degrees, fever to >104 degrees, stiff neck, dyspnea, wheezing, rash, vomiting, diarrhea, use of an antipyretic, and response to antipyretic.  The patient also reports headache.  The patient denies itchy watery eyes, itchy throat, sneezing, seasonal symptoms, response to antihistamine, muscle aches, and severe fatigue.  The patient denies the following risk factors for Strep sinusitis: unilateral facial pain, unilateral nasal discharge, poor response to decongestant, double sickening, tooth pain, Strep exposure, tender adenopathy, and absence of cough.    Current Medications (verified): 1)  Aspirin 81 Mg  Chew (Aspirin) 2)  Cosopt 2-0.5 % Soln (Dorzolamide-Timolol) .Marland Kitchen.. 1 Gtt Ou Two Times A Day 3)  Lumigan 0.01 % Soln (Bimatoprost) .Marland Kitchen.. 1 Drop Both Eyes At Bedtime. 4)  Benicar 40 Mg Tabs (Olmesartan Medoxomil) .Marland Kitchen.. 1 By Mouth Once Daily 5)  Zostavax 16109 Unt/0.4ml Solr (Zoster Vaccine Live) .Marland Kitchen.. 1 S.q.  Allergies: 1)  ! Codeine 2)  ! * Lisinopril 3)  ! Losartan Potassium (Losartan Potassium) 4)  ! Coreg 5)  ! 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 Pancreatitis, hx of Hypertension sees dermatology yearly d/t FH skin cancer  h/o retinal detachment , sees opht q 4 months  Hyperlipidemia  Past Surgical History: Last updated: 10/15/2007 Tubal ligation Cholecystectomy  Family History: Last updated: 01/12/2011 Breast Ca - sister colon ca-- no Lung Ca - Brother Melanoma - sister MI-- brother at age 19 ("he did drugs") DM--no stroke-- sister   Social History: Last updated: 01/12/2011 Married  Psychologist, 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 Breast Ca - sister colon ca-- no Lung Ca - Brother Melanoma - sister MI-- brother at age 39 ("he did drugs") DM--no stroke-- sister   Social History: Reviewed history from 01/12/2011 and no changes required. 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  Otoscopic examination reveals clear canals, tympanic membranes are intact bilaterally without bulging, retraction, inflammation or discharge. Hearing is grossly normal bilaterally. Nose:  mucosal  erythema, mucosal edema, L frontal sinus tenderness, L maxillary sinus tenderness, R frontal sinus tenderness, and R maxillary sinus tenderness.   Mouth:  + irritation and errythema front of roof of mouth no drainage throat clear Neck:  No deformities, masses, or tenderness noted. Lungs:  Normal respiratory effort, chest  expands symmetrically. Lungs are clear to auscultation, no crackles or wheezes. Heart:  normal rate and no murmur.   Psych:  Oriented X3 and normally interactive.     Impression & Recommendations:  Problem # 1:  SINUSITIS - ACUTE-NOS (ICD-461.9)  Her updated medication list for this problem includes:    Ceftin 500 Mg Tabs (Cefuroxime axetil) .Marland Kitchen... 1 by mouth two times a day    con't with mucinex  Instructed on treatment. Call if symptoms persist or worsen.   Problem # 2:  OTHER&UNSPECIFIED DISEASES THE ORAL SOFT TISSUES (ICD-528.9)     finish abx if symptoms do not improve f/u dentist  Complete Medication List: 1)  Aspirin 81 Mg Chew (Aspirin) 2)  Cosopt 2-0.5 % Soln (Dorzolamide-timolol) .Marland Kitchen.. 1 gtt ou two times a day 3)  Lumigan 0.01 % Soln (Bimatoprost) .Marland Kitchen.. 1 drop both eyes at bedtime. 4)  Benicar 40 Mg Tabs (Olmesartan medoxomil) .Marland Kitchen.. 1 by mouth once daily 5)  Zostavax 16109 Unt/0.52ml Solr (Zoster vaccine live) .Marland Kitchen.. 1 s.q. 6)  Ceftin 500 Mg Tabs (Cefuroxime axetil) .Marland Kitchen.. 1 by mouth two times a day Prescriptions: CEFTIN 500 MG TABS (CEFUROXIME AXETIL) 1 by mouth two times a day  #20 x 0   Entered and Authorized by:   Loreen Freud DO   Signed by:   Loreen Freud DO on 02/10/2011   Method used:   [facility]  [address] [id number]       [address]       [place], [place]  [address]       Ph: [phone] or [phone]       Fax: [phone]   RxID:   [id number]    Orders Added: 1)  Est. Patient Level III [03474]    Current Allergies (reviewed today): ! CODEINE ! * LISINOPRIL ! LOSARTAN POTASSIUM (LOSARTAN POTASSIUM) ! COREG ! * FELODIPINE

## 2011-02-20 ENCOUNTER — Other Ambulatory Visit: Payer: Self-pay | Admitting: Dermatology

## 2011-04-10 NOTE — Assessment & Plan Note (Signed)
[facility]                         GASTROENTEROLOGY OFFICE NOTE   Lori Joseph, Lori Joseph                         MRN:          [id number]  DATE:12/19/2007                            DOB:          1941/09/29    REFERRING PHYSICIAN:  Willow Ora, MD   REASON FOR EVALUATION:  Right lower quadrant pain.   HISTORY:  This is a 70 year old white female with a history of  hypertension, biliary pancreatitis, status post cholecystectomy, and  obesity.  She is referred through the courtesy of Dr. Drue Joseph regarding  right lower quadrant pain.  The patient reports to me a several history  of right lower quadrant discomfort, really more on the right side.  She  states the discomfort was initially so severe that she had trouble  walking.  The discomfort is exacerbated with movements.  She was  initially diagnosed with a urinary tract infection.  This was treated  with no significant improvement in her side pain.  She saw her  gynecologist and had a negative workup, including ultrasound.  She saw  Dr. Drue Joseph, who ordered a CT scan, which was reported as negative.  She was  felt to have musculoskeletal pain and was treated with Flexeril and  Mobic.  She states these medications were quite helpful.  Her discomfort  has improved significantly.  She denies nausea or vomiting, dysphagia,  heartburn, upper abdominal pain, change in bowel habits, melena,  hematochezia, or weight loss.  Her discomfort was never affected by  meals or bowel movements.  She did undergo colonoscopy in 2004.  This  revealed a few scattered diverticula in the ascending and sigmoid colon  regions.  No other abnormalities.  She does have a sister with colon  polyps and is interested in followup coloscopy.  She denies that her  pain is in any way reminiscent of her gallbladder problems.   PAST MEDICAL HISTORY:  1. Hypertension.  2. History of biliary pancreatitis in 1996 status post      cholecystectomy.  3.  Obesity.  4. Status post tonsillectomy.   ALLERGIES:  No known drug allergies.  She is intolerant to CODEINE,  which causes nausea.   CURRENT MEDICATIONS:  1. Benicar 20 mg daily.  2. Aspirin 81 mg daily.   FAMILY HISTORY:  Sister with adenomatous colon polyps.  Also a sister  with breast cancer.   SOCIAL HISTORY:  The patient is married with 2 children.  She lives with  her husband.  She does not smoke.  Does not use alcohol.   REVIEW OF SYSTEMS:  As per the diagnostic evaluation form.   PHYSICAL EXAM:  Well-appearing female in no acute distress.  Blood pressure 136/84, heart rate is 60 and regular, weight is 204.8  pounds.  She is 5 feet 3-1/2 inches in height.  HEENT:  Sclerae anicteric, conjunctivae pink, oral mucosa is intact.  There is no lymphadenopathy.  LUNGS:  Clear.  HEART:  Regular.  ABDOMEN:  Obese and soft without tenderness, mass, or hernia.  Palpation  to her right side does not elicit any tenderness.  Good  bowel sounds  heard.  EXTREMITIES:  Without edema.   IMPRESSION:  1. Right-sided pain most consistent with musculoskeletal etiology.      Significant improvement since onset.  2. Family history of colon polyps.  3. History of diverticulosis on colonoscopy 5 years ago.  4. Personal history of biliary pancreatitis status post      cholecystectomy.   RECOMMENDATIONS:  1. Reassurance regarding pain.  2. Colonoscopy to provide colorectal neoplasia screening.  Also, if      negative, provide additional reassurance to the patient.  The      nature of the procedure, as well as the risks, benefits, and      alternatives have been reviewed.  She understood and agreed to      proceed.     Lori Joseph. Lori Goodell, MD  Electronically Signed    JNP/MedQ  DD: 12/19/2007  DT: 12/19/2007  Job #: [id number]   cc:   Lori Ora, MD  [facility], M.D.

## 2011-04-10 NOTE — Assessment & Plan Note (Signed)
[facility]                            CARDIOLOGY OFFICE NOTE   SHERALD, BALBUENA                         MRN:          [id number]  DATE:10/31/2007                            DOB:          1941/08/19    PRIMARY CARE PHYSICIAN:  Willow Ora, M.D.   REASON FOR PRESENTATION:  Evaluate patient with fatigue and presyncope.   HISTORY OF PRESENT ILLNESS:  The patient is a pleasant 70 year old  without a prior cardiac history.  About 4 weeks ago, she was walking in  [place] at a tractor pull when she developed acute fatigue.  She felt  clammy.  She felt like she had to sit down.  She was fatigued until she  finally got in a car and was driving home.  She thinks her symptoms  lasted at least 30 minutes.  She said her heart rate was increased when  she got to the car but she does not think this was the primary problem.  She did not have any chest pressure, neck or arm discomfort.  She did  not have any syncope.  She never had symptoms like this before.  She has  not had any since.  She blames it on a low carb diet that she has been  on.  She is otherwise relatively active pushing a vacuum and doing some  walking but she does not exercise routinely.  This was the most exerting  activity she has gotten in a while.  With her usual activity, she cannot  bring on cardiovascular symptoms.  She did have blood work done by Dr.  Drue Novel.   PAST MEDICAL HISTORY:  1. Hypertension x7 years.  2. Pancreatitis.   PAST SURGICAL HISTORY:  1. Tubal ligation.  2. Cholecystectomy.   ALLERGIES:  CODEINE.   MEDICATIONS:  1. Benicar 20 mg daily.  2. Tylenol.  3. Aspirin 81 mg daily.   SOCIAL HISTORY:  She is a Investment banker, corporate.  She is married.  She has 2  children.  She has never smoked cigarettes.  She does not drink alcohol.   FAMILY HISTORY:  Is contributory for early coronary disease with a  brother having an MI at 35.  There is a strong family history of cancer.  her family members all smoked.   REVIEW OF SYSTEMS:  Positive for brand new diagnosis of a muscle pull in  her right lower abdomen.  She is not sure how she did this.  Otherwise,  negative for other systems.   PHYSICAL EXAMINATION:  GENERAL:  The patient is in no distress.  VITAL SIGNS:  Blood pressure 144/79, heart rate 65 and regular, weight  208 pounds.  HEENT:  Eyelids unremarkable.  Pupils are equal, round, and reactive to  light.  Fundi not visualized.  Oral mucosa unremarkable.  NECK:  No jugular venous distention.  Wave form within normal limits.  Carotid upstroke brisk and symmetric.  No bruits.  No thyromegaly.  LYMPHATICS:  No cervical, axillary, or inguinal adenopathy.  LUNGS:  Clear to auscultation bilaterally.  BACK:  No costovertebral angle tenderness.  CHEST:  Unremarkable.  HEART:  PMI not displaced or sustained.  S1 and S2 within normal limits.  No S3, no S4, no clicks, no rubs, no murmurs.  ABDOMEN:  Obese.  Positive bowel sounds, normal in frequency and pitch.  No bruits.  No rebound.  No guarding.  No midline pulsatile mass.  No  hepatomegaly.  No splenomegaly.  SKIN:  No rashes.  No nodules.  EXTREMITIES:  Two plus pulses throughout.  No edema.  No cyanosis.  No  clubbing.  NEUROLOGIC:  Oriented to person, place and time.  Cranial nerves II-XII  grossly intact.  Motor grossly intact.   EKG:  Sinus rhythm, rate 59, axis within normal limits, intervals within  normal limits, no acute ST wave changes.   ASSESSMENT/PLAN:  1. Dizziness/presyncope.  The etiology of this episode is not entirely      clear.  It may well have been hypoglycemia related to her diet.      There are no symptoms consistent with anginal equivalent.  It does      not appear to be a dysrhythmia.  At this point, I am going to      perform an exercise treadmill to evaluate this but also for reasons      described below.  2. Hypertension.  Blood pressure is currently well controlled.  She       will maintain the medications as listed.  3. Obesity.  She understands the need to loose weight with diet and      exercise and she is participating in this.  I encourage and applaud      this effort.  4. Risk stratification.  Given the patient's family history and      hypertension, I think it is prudent to screen her with an exercise      treadmill test.  This will also reproduce some of the activity that      she had the day she had symptoms.  We will see if there is any      reproducible arrhythmia or other findings to explain these events.      I will be able to risk stratify, screen for coronary disease which      I think is low present-test probability.  Most importantly I will      give her a prescription for exercise.   FOLLOWUP:  At the time of her treadmill.     Rollene Rotunda, MD, [facility]  Electronically Signed   JH/MedQ  DD: 10/31/2007  DT: 10/31/2007  Job #: [id number]   cc:   Willow Ora, MD

## 2011-04-13 NOTE — Op Note (Signed)
   NAME:  Lori Joseph, Lori Joseph                            ACCOUNT NO.:  [id number]   MEDICAL RECORD NO.:  [id number]                   PATIENT TYPE:  AMB   LOCATION:  [facility]                                 FACILITY:  [facility]   PHYSICIAN:  Gretta Cool, M.D.              DATE OF BIRTH:  1941-04-28   DATE OF PROCEDURE:  01/15/2003  DATE OF DISCHARGE:                                 OPERATIVE REPORT   PREOPERATIVE DIAGNOSES:  Abnormal uterine bleeding with endometrial polyp by  ultrasound.   POSTOPERATIVE DIAGNOSES:  Abnormal uterine bleeding with endometrial polyp  by ultrasound.   PROCEDURE:  1. Hysteroscopy.  2. Resection of endometrial polyp.  3. Total resection for ablation.  4. VaporTrode.   SURGEON:  Gretta Cool, M.D.   ANESTHESIA:  IV sedation and paracervical block.   DESCRIPTION OF PROCEDURE:  Under excellent anesthesia as above with the  patient's abdomen prepped and draped in Allen stirrups, a speculum was  placed in the vagina and the cervix grasped with single tooth tenaculum.  After paracervical block four quadrant the cervix was progressively dilated  with series of Pratt dilators to accommodate the 7 mm resectoscope.  The  endometrial cavity was then systematically photographed.  The polyp was  identified on the posterior wall.  It was then resected totally and the  entire endometrial cavity then also resected so as to remove all of the  endometrial tissue.  The resection was carried 3-5 mm down into the  myometrium.  Once the entire cavity was resected the cavity was treated by  VaporTrode so as to eliminate any islands of viable tissue remaining.  At  the end of the procedure the bleeding was minimal with pressure reduced.  There were no complications.  The patient returned to recovery room in  excellent condition.                                               Gretta Cool, M.D.    CWL/MEDQ  D:  01/15/2003  T:  01/15/2003  Job:  [id number]   cc:   Duncan Dull, M.D.  [address]  [place]  [place] [address]  Fax: [phone]

## 2011-04-13 NOTE — Assessment & Plan Note (Signed)
[facility]                                 ON-CALL NOTE   CURTINA, GRILLS                           MRN:          [id number]  DATE:12/22/2006                            DOB:          12/26/1940    PRIMARY CARE PHYSICIAN:  Dr. Drue Novel.   TIME RECEIVED:  10:34 a.m.   TELEPHONE:  [id number].   The patient describes 2 days of dizziness when she moves suddenly.  She  has never had this before.  When she lies still or sits still, she feels  fine, however when she turns over in bed or moves her head suddenly the  room begins spinning.  There is no headache, no other neurologic  symptoms at all.  There has been no nausea or vomiting.  She is allergic  to PENICILLIN.  My response is this does sound like an inner ear  infection.  If these are her only symptoms, I think she would be fine to  rest today and drink fluids.  I did suggest she be seen in the office  tomorrow.  I called in Meclizine 25 mg to take every 4 hours as needed  for dizziness [facility] [phone].  I did caution her however if her  symptoms change or worsen in any way she should contact me again today.     Tera Mater. Clent Ridges, MD  Electronically Signed    SAF/MedQ  DD: 12/22/2006  DT: 12/22/2006  Job #: [id number]

## 2011-04-16 ENCOUNTER — Other Ambulatory Visit: Payer: Self-pay | Admitting: Gynecology

## 2011-04-16 DIAGNOSIS — Z1231 Encounter for screening mammogram for malignant neoplasm of breast: Secondary | ICD-10-CM

## 2011-05-09 ENCOUNTER — Ambulatory Visit
Admission: RE | Admit: 2011-05-09 | Discharge: 2011-05-09 | Disposition: A | Discharge status: Home / Self Care | Payer: Medicare Other | Source: Ambulatory Visit | Attending: Gynecology | Admitting: Gynecology

## 2011-05-09 DIAGNOSIS — Z1231 Encounter for screening mammogram for malignant neoplasm of breast: Secondary | ICD-10-CM

## 2011-05-16 ENCOUNTER — Other Ambulatory Visit: Payer: Self-pay | Admitting: Gynecology

## 2011-11-28 ENCOUNTER — Ambulatory Visit (INDEPENDENT_AMBULATORY_CARE_PROVIDER_SITE_OTHER): Payer: Medicare Other | Admitting: Internal Medicine

## 2011-11-28 ENCOUNTER — Encounter: Payer: Self-pay | Admitting: Internal Medicine

## 2011-11-28 VITALS — BP 140/86 | HR 62 | Temp 98.1°F | Wt 221.0 lb

## 2011-11-28 DIAGNOSIS — J3489 Other specified disorders of nose and nasal sinuses: Secondary | ICD-10-CM

## 2011-11-28 DIAGNOSIS — J019 Acute sinusitis, unspecified: Secondary | ICD-10-CM

## 2011-11-28 MED ORDER — AMOXICILLIN 500 MG PO CAPS: 1000.0000 mg | ORAL_CAPSULE | Freq: Two times a day (BID) | ORAL | Status: AC

## 2011-11-28 MED ORDER — FLUTICASONE PROPIONATE 50 MCG/ACT NA SUSP: 2.0000 | Freq: Every day | NASAL | Status: DC

## 2011-11-28 NOTE — Progress Notes (Signed)
  Subjective:    Patient ID: Lori Joseph, female    DOB: Mar 26, 1941, 70 y.o.   MRN: [id number]  HPI Acute visit Respiratory symptoms that started about 2 weeks ago, complains of head congestion, sinus pressure more on the right side, here lately she has a lot of green, thick nasal discharge. Teeth are hurting.  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 Mild myalgias.    Objective:   Physical Exam  Constitutional: She is oriented to person, place, and time. She appears well-developed. No distress.  HENT:  Head: Normocephalic and atraumatic.  Right Ear: External ear normal.  Left Ear: External ear normal.       Nose congested, throat without redness, gum is palpated and no evidence of abscess. Face is symmetric, nontender to palpation  Eyes: EOM are normal.  Cardiovascular: Normal rate, regular rhythm and normal heart sounds.   No murmur heard. Pulmonary/Chest: Effort normal. No respiratory distress. She has no wheezes. She has no rales.  Neurological: She is alert and oriented to person, place, and time.  Skin: She is not diaphoretic.          Assessment & Plan:  [phone]

## 2011-11-28 NOTE — Assessment & Plan Note (Signed)
Sx c/w acute sinusitis, declined po steroids See instructions

## 2011-11-28 NOTE — Patient Instructions (Addendum)
Rest, fluids , tylenol For cough, take Mucinex DM twice a day as needed  For congestion use flonase 2 sprays on each side of the nose every day saline nose irrigation as needed  Take the antibiotic as prescribed ----> Amoxicillin Call if no better in few days Call anytime if the symptoms are severe

## 2011-12-17 ENCOUNTER — Other Ambulatory Visit: Payer: Self-pay | Admitting: Internal Medicine

## 2011-12-17 MED ORDER — OLMESARTAN MEDOXOMIL 20 MG PO TABS: 40.0000 mg | ORAL_TABLET | Freq: Every day | ORAL | Status: DC

## 2011-12-17 NOTE — Telephone Encounter (Signed)
Done

## 2012-05-09 ENCOUNTER — Other Ambulatory Visit: Payer: Self-pay | Admitting: Internal Medicine

## 2012-05-09 DIAGNOSIS — Z1231 Encounter for screening mammogram for malignant neoplasm of breast: Secondary | ICD-10-CM

## 2012-05-23 ENCOUNTER — Ambulatory Visit
Admission: RE | Admit: 2012-05-23 | Discharge: 2012-05-23 | Disposition: A | Discharge status: Home / Self Care | Payer: Medicare Other | Source: Ambulatory Visit | Attending: Internal Medicine | Admitting: Internal Medicine

## 2012-05-23 DIAGNOSIS — Z1231 Encounter for screening mammogram for malignant neoplasm of breast: Secondary | ICD-10-CM

## 2012-06-18 ENCOUNTER — Ambulatory Visit (INDEPENDENT_AMBULATORY_CARE_PROVIDER_SITE_OTHER): Payer: Medicare Other | Admitting: Internal Medicine

## 2012-06-18 ENCOUNTER — Encounter: Payer: Self-pay | Admitting: Internal Medicine

## 2012-06-18 VITALS — BP 136/88 | HR 58 | Temp 98.3°F | Ht 63.0 in | Wt 215.0 lb

## 2012-06-18 DIAGNOSIS — Z0000 Encounter for general adult medical examination without abnormal findings: Secondary | ICD-10-CM

## 2012-06-18 DIAGNOSIS — M949 Disorder of cartilage, unspecified: Secondary | ICD-10-CM

## 2012-06-18 DIAGNOSIS — I10 Essential (primary) hypertension: Secondary | ICD-10-CM

## 2012-06-18 DIAGNOSIS — M899 Disorder of bone, unspecified: Secondary | ICD-10-CM

## 2012-06-18 DIAGNOSIS — C4491 Basal cell carcinoma of skin, unspecified: Secondary | ICD-10-CM | POA: Insufficient documentation

## 2012-06-18 DIAGNOSIS — E785 Hyperlipidemia, unspecified: Secondary | ICD-10-CM

## 2012-06-18 DIAGNOSIS — E559 Vitamin D deficiency, unspecified: Secondary | ICD-10-CM

## 2012-06-18 DIAGNOSIS — E669 Obesity, unspecified: Secondary | ICD-10-CM

## 2012-06-18 LAB — CBC WITH DIFFERENTIAL/PLATELET
Basophils Relative: 0.2 % (ref 0.0–3.0)
Eosinophils Relative: 2.8 % (ref 0.0–5.0)
HCT: 33.5 % — ABNORMAL LOW (ref 36.0–46.0)
Hemoglobin: 11.2 g/dL — ABNORMAL LOW (ref 12.0–15.0)
Lymphs Abs: 1.6 10*3/uL (ref 0.7–4.0)
MCV: 91.2 fl (ref 78.0–100.0)
Monocytes Absolute: 0.2 10*3/uL (ref 0.1–1.0)
Monocytes Relative: 5.7 % (ref 3.0–12.0)
RBC: 3.67 Mil/uL — ABNORMAL LOW (ref 3.87–5.11)
WBC: 4.2 10*3/uL — ABNORMAL LOW (ref 4.5–10.5)

## 2012-06-18 LAB — BASIC METABOLIC PANEL
Chloride: 105 mEq/L (ref 96–112)
GFR: 74.06 mL/min (ref >60.00)
Potassium: 3.6 mEq/L (ref 3.5–5.1)
Sodium: 140 mEq/L (ref 135–145)

## 2012-06-18 LAB — LIPID PANEL
Total CHOL/HDL Ratio: 4
VLDL: 19.4 mg/dL (ref 0.0–40.0)

## 2012-06-18 LAB — LDL CHOLESTEROL, DIRECT: Direct LDL: 140.7 mg/dL

## 2012-06-18 MED ORDER — OLMESARTAN MEDOXOMIL 20 MG PO TABS: 40.0000 mg | ORAL_TABLET | Freq: Every day | ORAL | Status: DC

## 2012-06-18 NOTE — Progress Notes (Signed)
  Subjective:    Patient ID: Lori Joseph, female    DOB: Feb 20, 1941, 71 y.o.   MRN: [id number]  HPI  Here for Medicare AWV: 1. Risk factors based on Past M, S, F history: reviewed 2. Physical Activities: sedentary, desk job, plans to become more active  3. Depression/mood: no problems noted or reported  4. Hearing: no problems noted or reported  5. ADL's: independent  6. Fall Risk: no recent falls, prevention discussed 7. Home Safety: does feel safe at home  8. Height, weight, &visual acuity: see VS, sees ophtalmology q 4 months   9. Counseling: yes 10. Labs ordered based on risk factors: yes 11.       Referral Coordination, if needed 12.       Care Plan, see assessment and plan 13.        Cognitive Assessment:   motor skills, cognition and memory seem normal  in addition, we discussed the following Hypertension, good medication compliance, ambulatory BPs around 118/72. Allergies, requiring Flonase only as needed. History of osteopenia, good compliance with over-the-counter calcium and vitamin D.  Past Medical History: HTN Hyperlipidemia BCC, sees Dr. 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 No dysuria or gross hematuria. Very seldom, once a year, has a severe cramp at the right leg. Last few minutes.    Objective:   Physical Exam  General -- alert, well-developed, and overweight appearing. No apparent distress.  Neck --no thyromegaly, normal carotid pulses Lungs -- normal  respiratory effort, no intercostal retractions, no accessory muscle use, and normal breath sounds.   Heart-- normal rate, regular rhythm, no murmur, and no gallop.   Abdomen--soft, non-tender, no distention, no masses, no HSM, no guarding, and no rigidity.   Extremities-- no pretibial edema bilaterally  Neurologic-- alert & oriented X3 and strength normal in all extremities. Psych-- Cognition and judgment appear intact. Alert and cooperative with normal attention span and concentration.  not anxious appearing and not depressed appearing.      Assessment & Plan:

## 2012-06-18 NOTE — Assessment & Plan Note (Signed)
Diet and exercise discussed, check a TSH

## 2012-06-18 NOTE — Assessment & Plan Note (Signed)
Td 2008 pneumonia shot 2008 Shingles immunization----> got it 2012   Saw Dr. [name] recently, had a PAP Had a MMG 04-2012   diet-exercise discussed   Cscope @ [facility] 01-2008 : 2 polyps ----> tubular adenomas w/  low grade dysplasia colonoscopy 08/2010, very small polyps, next 5 years

## 2012-06-18 NOTE — Assessment & Plan Note (Signed)
DEXA: 2006, T score -1.2 osteopenia DEXA 09-2009 normal Vitamin D is slightly low 09-2009 plan: DEXA,  recheck a vitamin D

## 2012-06-18 NOTE — Assessment & Plan Note (Signed)
Well controlled on Benicar, check a BMP

## 2012-06-18 NOTE — Assessment & Plan Note (Signed)
On diet control only, last cholesterol panel satisfactory. Check FLP

## 2012-06-18 NOTE — Patient Instructions (Addendum)
Next visit in one year as long as you feel well, your blood pressure is well-controlled and you labs are normal Check the  blood pressure 2 or 3 times a month, be sure it is between 110/60 and 140/85. If it is consistently higher or lower, let me know

## 2012-06-19 LAB — VITAMIN D 25 HYDROXY (VIT D DEFICIENCY, FRACTURES): Vit D, 25-Hydroxy: 68 ng/mL (ref 30–89)

## 2012-06-27 ENCOUNTER — Other Ambulatory Visit (INDEPENDENT_AMBULATORY_CARE_PROVIDER_SITE_OTHER): Payer: Medicare Other

## 2012-06-27 DIAGNOSIS — D649 Anemia, unspecified: Secondary | ICD-10-CM

## 2012-06-27 LAB — IRON: Iron: 88 ug/dL (ref 42–145)

## 2012-06-30 ENCOUNTER — Encounter: Payer: Self-pay | Admitting: *Deleted

## 2012-07-02 ENCOUNTER — Ambulatory Visit (INDEPENDENT_AMBULATORY_CARE_PROVIDER_SITE_OTHER)
Admission: RE | Admit: 2012-07-02 | Discharge: 2012-07-02 | Disposition: A | Discharge status: Home / Self Care | Payer: Medicare Other | Source: Ambulatory Visit

## 2012-07-02 DIAGNOSIS — M949 Disorder of cartilage, unspecified: Secondary | ICD-10-CM

## 2012-07-02 DIAGNOSIS — M899 Disorder of bone, unspecified: Secondary | ICD-10-CM

## 2013-06-08 ENCOUNTER — Encounter: Payer: Self-pay | Admitting: Internal Medicine

## 2013-06-08 ENCOUNTER — Ambulatory Visit (INDEPENDENT_AMBULATORY_CARE_PROVIDER_SITE_OTHER): Payer: Medicare Other | Admitting: Internal Medicine

## 2013-06-08 VITALS — BP 165/95 | HR 64 | Temp 98.2°F | Wt 229.4 lb

## 2013-06-08 DIAGNOSIS — M791 Myalgia, unspecified site: Secondary | ICD-10-CM | POA: Insufficient documentation

## 2013-06-08 DIAGNOSIS — I10 Essential (primary) hypertension: Secondary | ICD-10-CM

## 2013-06-08 DIAGNOSIS — M7918 Myalgia, other site: Secondary | ICD-10-CM

## 2013-06-08 DIAGNOSIS — IMO0001 Reserved for inherently not codable concepts without codable children: Secondary | ICD-10-CM

## 2013-06-08 MED ORDER — PREDNISONE 10 MG PO TABS: ORAL_TABLET | ORAL | Status: DC

## 2013-06-08 NOTE — Assessment & Plan Note (Addendum)
Pain may be related to the hip, back or other  muscle skeletal issues. Intolerant to Aleve due to edema. Plan:  Prednisone Tylenol X-ray If not better will call for possible referral.

## 2013-06-08 NOTE — Progress Notes (Signed)
  Subjective:    Patient ID: Lori Joseph, female    DOB: 08-Mar-1941, 72 y.o.   MRN: [id number]  HPI Acute visit 2 weeks ago was doing yard work and shortly after developed a deep ache in the left buttock with occasional radiation to the left groin area. Does not radiate to the extremities. The first 3 days she took OTC Aleve but she noted some swelling in her toes and feet, then she took ibuprofen which didn't help, finally took Tylenol which helped. She still taking Tylenol as needed.  Her blood pressure is noted to be slightly elevated today, reports good medication compliance, reports good ambulatory BPs have been slightly elevated since the onset of pain although she couldn't give me exact readings   Past Medical History: HTN Hyperlipidemia BCC, sees Dr. 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 No rash in the back. no bladder or bowel incontinence. No dysuria gross hematuria. No lower extremity paresthesias.     Objective:   Physical Exam BP 165/95  Pulse 64  Temp(Src) 98.2 F (36.8 C) (Oral)  Wt 229 lb 6.4 oz (104.055 kg)  BMI 40.65 kg/m2  SpO2 96%  General -- alert, well-developed, nad   Abdomen--soft, non-tender, no distention, no masses.   Extremities-- no pretibial or feet edema; hip rotation normal Back: Nontender to palpation of the thoracic or lumbar spine. No tender to palpation at the sacroiliac area. Neurologic-- alert & oriented X3; Motor is intact, straight leg negative, DTRs symmetrically decreased at both  extremities. Psych-- Cognition and judgment appear intact. Alert and cooperative with normal attention span and concentration.  not anxious appearing and not depressed appearing.       Assessment & Plan:

## 2013-06-08 NOTE — Assessment & Plan Note (Addendum)
BP slightly elevated today, started to be elevated according to the patient with the onset of back pain. I wonder if also elevated d/t NSAIDS Plan: Check a BMP, low-salt diet, return to the office in one month for a physical, monitor BPs

## 2013-06-08 NOTE — Patient Instructions (Addendum)
Please get your x-ray at the other [facility]  office located at: [address] [place], across from [facility].  Please go to the basement, this is a walk-in facility, they are open from 8:30 to 5:30 PM. Phone number [phone]. --- Prednisone as prescribed for few days Tylenol  500 mg OTC 2 tabs a day every 8 hours as needed for pain donb't take advil, motrin, naproxen, ibuprofen or similar meds Call if no better in 10-14 days  --- Check the  blood pressure 2 or 3 times a week, be sure it is between 110/60 and 140/85. If it is consistently higher or lower, let me know --- Come back for a physical, fasting , in 1 month

## 2013-06-09 ENCOUNTER — Ambulatory Visit (INDEPENDENT_AMBULATORY_CARE_PROVIDER_SITE_OTHER)
Admission: RE | Admit: 2013-06-09 | Discharge: 2013-06-09 | Disposition: A | Discharge status: Home / Self Care | Payer: Medicare Other | Source: Ambulatory Visit | Attending: Internal Medicine | Admitting: Internal Medicine

## 2013-06-09 DIAGNOSIS — IMO0001 Reserved for inherently not codable concepts without codable children: Secondary | ICD-10-CM

## 2013-06-09 DIAGNOSIS — M7918 Myalgia, other site: Secondary | ICD-10-CM

## 2013-06-09 LAB — BASIC METABOLIC PANEL
BUN: 16 mg/dL (ref 6–23)
Calcium: 9.2 mg/dL (ref 8.4–10.5)
GFR: 69.86 mL/min (ref >60.00)
Potassium: 4.4 mEq/L (ref 3.5–5.1)
Sodium: 139 mEq/L (ref 135–145)

## 2013-06-11 ENCOUNTER — Encounter: Payer: Self-pay | Admitting: *Deleted

## 2013-06-11 ENCOUNTER — Telehealth: Payer: Self-pay | Admitting: *Deleted

## 2013-06-11 NOTE — Telephone Encounter (Signed)
Message copied by Shirlee More I on Thu Jun 11, 2013  9:33 AM ------      Message from: Willow Ora E      Created: Thu Jun 11, 2013  8:44 AM       Advise patient, labs  normal. ------

## 2013-06-11 NOTE — Telephone Encounter (Signed)
lmvom to return call.

## 2013-06-12 ENCOUNTER — Telehealth: Payer: Self-pay | Admitting: *Deleted

## 2013-06-12 NOTE — Telephone Encounter (Signed)
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 If she's not better with treatment In the next few days, she needs to let us know. ------

## 2013-06-12 NOTE — Telephone Encounter (Signed)
Discussed with patient, she states she is feeling a little bit better.

## 2013-06-12 NOTE — Telephone Encounter (Signed)
Discussed with patient, verbalized understanding.  

## 2013-06-12 NOTE — Telephone Encounter (Signed)
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 If she's not better with treatment In the next few days, she needs to let us know. ------

## 2013-06-12 NOTE — Telephone Encounter (Signed)
thx

## 2013-07-05 ENCOUNTER — Other Ambulatory Visit: Payer: Self-pay | Admitting: Internal Medicine

## 2013-07-06 ENCOUNTER — Telehealth: Payer: Self-pay | Admitting: Internal Medicine

## 2013-07-06 NOTE — Telephone Encounter (Signed)
Okay to send a second round of prednisone, same sig If the pain is not better in 2 weeks, please arrange a  orthopedic surgical referral

## 2013-07-06 NOTE — Telephone Encounter (Signed)
Refill done.  

## 2013-07-06 NOTE — Telephone Encounter (Signed)
Patient came in on 05/1413 for some pain she was having and states that the Prednisone did work for her but now her pain has returned. She is calling to see what Dr. Drue Novel would advise she do.

## 2013-07-06 NOTE — Telephone Encounter (Signed)
Recommendations please? 

## 2013-07-07 MED ORDER — PREDNISONE 10 MG PO TABS: ORAL_TABLET | ORAL | Status: DC

## 2013-07-07 NOTE — Telephone Encounter (Signed)
Spoke with patient and advised of recommendations. Sent new Rx to pharmacy.

## 2013-08-04 ENCOUNTER — Ambulatory Visit (INDEPENDENT_AMBULATORY_CARE_PROVIDER_SITE_OTHER): Payer: Medicare Other | Admitting: Internal Medicine

## 2013-08-04 ENCOUNTER — Encounter: Payer: Self-pay | Admitting: Internal Medicine

## 2013-08-04 VITALS — BP 136/85 | HR 64 | Temp 98.5°F | Ht 63.5 in | Wt 225.6 lb

## 2013-08-04 DIAGNOSIS — M899 Disorder of bone, unspecified: Secondary | ICD-10-CM

## 2013-08-04 DIAGNOSIS — E785 Hyperlipidemia, unspecified: Secondary | ICD-10-CM

## 2013-08-04 DIAGNOSIS — Z0000 Encounter for general adult medical examination without abnormal findings: Secondary | ICD-10-CM

## 2013-08-04 DIAGNOSIS — I10 Essential (primary) hypertension: Secondary | ICD-10-CM

## 2013-08-04 DIAGNOSIS — M7918 Myalgia, other site: Secondary | ICD-10-CM

## 2013-08-04 DIAGNOSIS — IMO0001 Reserved for inherently not codable concepts without codable children: Secondary | ICD-10-CM

## 2013-08-04 LAB — LIPID PANEL
Cholesterol: 242 mg/dL — ABNORMAL HIGH (ref 0–200)
Total CHOL/HDL Ratio: 5

## 2013-08-04 LAB — CBC WITH DIFFERENTIAL/PLATELET
Basophils Relative: 0.8 % (ref 0.0–3.0)
Eosinophils Relative: 4.9 % (ref 0.0–5.0)
HCT: 37.7 % (ref 36.0–46.0)
MCV: 90.5 fl (ref 78.0–100.0)
Monocytes Absolute: 0.3 10*3/uL (ref 0.1–1.0)
Monocytes Relative: 7.3 % (ref 3.0–12.0)
Neutrophils Relative %: 50.6 % (ref 43.0–77.0)
RBC: 4.16 Mil/uL (ref 3.87–5.11)
WBC: 4.6 10*3/uL (ref 4.5–10.5)

## 2013-08-04 LAB — LDL CHOLESTEROL, DIRECT: Direct LDL: 177.2 mg/dL

## 2013-08-04 LAB — AST: AST: 24 U/L (ref 0–37)

## 2013-08-04 NOTE — Patient Instructions (Signed)
Get your blood work before you leave  Next visit in 6 months for a routine office visit Please make an appointment before you leave the office today (or call few weeks in advance)  --- Tylenol  500 mg OTC 2 tabs a day every 8 hours as needed for pain ---    Fall Prevention and Home Safety Falls cause injuries and can affect all age groups. It is possible to use preventive measures to significantly decrease the likelihood of falls. There are many simple measures which can make your home safer and prevent falls. OUTDOORS  Repair cracks and edges of walkways and driveways.  Remove high doorway thresholds.  Trim shrubbery on the main path into your home.  Have good outside lighting.  Clear walkways of tools, rocks, debris, and clutter.  Check that handrails are not broken and are securely fastened. Both sides of steps should have handrails.  Have leaves, snow, and ice cleared regularly.  Use sand or salt on walkways during winter months.  In the garage, clean up grease or oil spills. BATHROOM  Install night lights.  Install grab bars by the toilet and in the tub and shower.  Use non-skid mats or decals in the tub or shower.  Place a plastic non-slip stool in the shower to sit on, if needed.  Keep floors dry and clean up all water on the floor immediately.  Remove soap buildup in the tub or shower on a regular basis.  Secure bath mats with non-slip, double-sided rug tape.  Remove throw rugs and tripping hazards from the floors. BEDROOMS  Install night lights.  Make sure a bedside light is easy to reach.  Do not use oversized bedding.  Keep a telephone by your bedside.  Have a firm chair with side arms to use for getting dressed.  Remove throw rugs and tripping hazards from the floor. KITCHEN  Keep handles on pots and pans turned toward the center of the stove. Use back burners when possible.  Clean up spills quickly and allow time for drying.  Avoid  walking on wet floors.  Avoid hot utensils and knives.  Position shelves so they are not too high or low.  Place commonly used objects within easy reach.  If necessary, use a sturdy step stool with a grab bar when reaching.  Keep electrical cables out of the way.  Do not use floor polish or wax that makes floors slippery. If you must use wax, use non-skid floor wax.  Remove throw rugs and tripping hazards from the floor. STAIRWAYS  Never leave objects on stairs.  Place handrails on both sides of stairways and use them. Fix any loose handrails. Make sure handrails on both sides of the stairways are as long as the stairs.  Check carpeting to make sure it is firmly attached along stairs. Make repairs to worn or loose carpet promptly.  Avoid placing throw rugs at the top or bottom of stairways, or properly secure the rug with carpet tape to prevent slippage. Get rid of throw rugs, if possible.  Have an electrician put in a light switch at the top and bottom of the stairs. OTHER FALL PREVENTION TIPS  Wear low-heel or rubber-soled shoes that are supportive and fit well. Wear closed toe shoes.  When using a stepladder, make sure it is fully opened and both spreaders are firmly locked. Do not climb a closed stepladder.  Add color or contrast paint or tape to grab bars and handrails in your home.  Place contrasting color strips on first and last steps.  Learn and use mobility aids as needed. Install an electrical emergency response system.  Turn on lights to avoid dark areas. Replace light bulbs that burn out immediately. Get light switches that glow.  Arrange furniture to create clear pathways. Keep furniture in the same place.  Firmly attach carpet with non-skid or double-sided tape.  Eliminate uneven floor surfaces.  Select a carpet pattern that does not visually hide the edge of steps.  Be aware of all pets. OTHER HOME SAFETY TIPS  Set the water temperature for 120 F  (48.8 C).  Keep emergency numbers on or near the telephone.  Keep smoke detectors on every level of the home and near sleeping areas. Document Released: 11/02/2002 Document Revised: 05/13/2012 Document Reviewed: 02/01/2012 [facility] Patient Information 2014 [place], [place].

## 2013-08-04 NOTE — Assessment & Plan Note (Signed)
dexa 05-2012 Tscore - 1.0 Vit d normal last year rec Ca and Vit D

## 2013-08-04 NOTE — Assessment & Plan Note (Addendum)
Td 2008 pneumonia shot 2008 Shingles immunization----> got it 2012   Used to see  Dr. [name], plans to see Dr Glynda Jaeger Had a MMG 04-2012, just got a reminder card, plans to go   diet-exercise discussed   Cscope @ [facility] 01-2008 : 2 polyps ----> tubular adenomas w/  low grade dysplasia colonoscopy 08/2010, very small polyps, next 5 years

## 2013-08-04 NOTE — Assessment & Plan Note (Signed)
X-rays showed mild DJD, pain improving, continue with Tylenol as needed

## 2013-08-04 NOTE — Assessment & Plan Note (Addendum)
Diet-exercise discussed , agreed to a referral to nutritionist

## 2013-08-04 NOTE — Progress Notes (Signed)
  Subjective:    Patient ID: Lori Joseph, female    DOB: 1941-01-17, 72 y.o.   MRN: [id number]  HPI  Here for Medicare AWV:  1. Risk factors based on Past M, S, F history: reviewed  2. Physical Activities: sedentary, desk job, yard work, no routine exercise  3. Depression/mood: neg screening 4. Hearing: no problems noted or reported  5. ADL's: independent  6. Fall Risk: fell once since last year, mechanical fall, prevention discussed  7. Home Safety: does feel safe at home  8. Height, weight, &visual acuity: see VS, sees ophtalmology q 4 months  9. Counseling: yes  10. Labs ordered based on risk factors: yes  11. Referral Coordination, if needed  12. Care Plan, see assessment and plan  13. Cognitive Assessment: motor skills, cognition and memory seem normal   in addition, we discussed the following Hypertension, good medication compliance, reports normal ambulatory BPs Allergies, takes Flonase regularly. Hyperlipidemia, on no medications, needs to work on her lifestyle. Was recently see seen with hip pain, x-ray negative, feeling better.    Past Medical History: HTN Hyperlipidemia BCC, sees Dr. 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   Neck --no thyromegaly , normal carotid pulse Lungs -- normal respiratory effort, no intercostal retractions, no accessory muscle use, and normal breath sounds.  Heart-- normal rate, regular rhythm, no murmur.  Abdomen-- Not distended, good bowel sounds,soft, non-tender. Extremities-- no pretibial edema bilaterally , some varicosities at R leg w/o phlebitis Neurologic-- alert & oriented X3. Speech, gait normal. Psych-- Cognition and judgment appear intact. Alert and cooperative with normal attention span and concentration. not anxious appearing and not depressed appearing.     Assessment & Plan:

## 2013-08-04 NOTE — Assessment & Plan Note (Addendum)
Seems well controlled, last BMP ok, no change

## 2013-08-06 ENCOUNTER — Telehealth: Payer: Self-pay | Admitting: *Deleted

## 2013-08-06 ENCOUNTER — Encounter: Payer: Self-pay | Admitting: *Deleted

## 2013-08-06 NOTE — Telephone Encounter (Signed)
Message copied by Eustace Quail on Thu Aug 06, 2013  4:43 PM ------      Message from: Willow Ora E      Created: Thu Aug 06, 2013  4:21 PM       Please call patient:      Her cholesterol has increased compared to a year ago, went from 210 to 68.      Other labs are normal.      In addition to change her diet and exercise more, if she is ready to start a medication for cholesterol let me know. Otherwise will recheck in 6 months. ------

## 2013-08-06 NOTE — Telephone Encounter (Signed)
Message copied by Eustace Quail on Thu Aug 06, 2013  4:46 PM ------      Message from: Willow Ora E      Created: Thu Aug 06, 2013  4:21 PM       Please call patient:      Her cholesterol has increased compared to a year ago, went from 210 to 86.      Other labs are normal.      In addition to change her diet and exercise more, if she is ready to start a medication for cholesterol let me know. Otherwise will recheck in 6 months. ------

## 2013-08-06 NOTE — Telephone Encounter (Signed)
Spoke to pt via telephone about lab results and pt would like to start medication for cholesterol. DJR

## 2013-08-06 NOTE — Telephone Encounter (Signed)
Spoke to pt via telephone about lab results and pt would like to start medication for cholesterol. DJR   

## 2013-08-06 NOTE — Telephone Encounter (Signed)
Message copied by Eustace Quail on Thu Aug 06, 2013  4:48 PM ------      Message from: Willow Ora E      Created: Thu Aug 06, 2013  4:21 PM       Please call patient:      Her cholesterol has increased compared to a year ago, went from 210 to 22.      Other labs are normal.      In addition to change her diet and exercise more, if she is ready to start a medication for cholesterol let me know. Otherwise will recheck in 6 months. ------

## 2013-08-07 ENCOUNTER — Other Ambulatory Visit: Payer: Self-pay | Admitting: *Deleted

## 2013-08-07 ENCOUNTER — Telehealth: Payer: Self-pay | Admitting: *Deleted

## 2013-08-07 DIAGNOSIS — I10 Essential (primary) hypertension: Secondary | ICD-10-CM

## 2013-08-07 MED ORDER — ATORVASTATIN CALCIUM 40 MG PO TABS: 40.0000 mg | ORAL_TABLET | Freq: Every day | ORAL | Status: DC

## 2013-08-07 NOTE — Telephone Encounter (Signed)
Lipitor 40 mg 1 by mouth each bedtime #30 and 2 refills Schedule a office visit in 2 months for a checkup. Patient to call if side effects such as muscle aches

## 2013-08-07 NOTE — Telephone Encounter (Signed)
Pt notified of new medication lipitor, adverse s/s to watch out for and to schedule for a checkup in 2 months. Pt agrees and understands.  Pt also states that she plans on seeing a nutritionist and that her sister had muscle aches while taking lipitor but will still try lipitor unless adverse s/s occur. DJR

## 2013-08-07 NOTE — Telephone Encounter (Signed)
Noted, thx.

## 2013-10-01 ENCOUNTER — Other Ambulatory Visit: Payer: Self-pay

## 2013-10-16 ENCOUNTER — Ambulatory Visit (INDEPENDENT_AMBULATORY_CARE_PROVIDER_SITE_OTHER): Payer: Medicare Other | Admitting: Internal Medicine

## 2013-10-16 ENCOUNTER — Encounter: Payer: Self-pay | Admitting: Internal Medicine

## 2013-10-16 VITALS — BP 138/84 | HR 60 | Temp 97.6°F | Resp 18 | Wt 221.0 lb

## 2013-10-16 DIAGNOSIS — E785 Hyperlipidemia, unspecified: Secondary | ICD-10-CM

## 2013-10-16 DIAGNOSIS — M7989 Other specified soft tissue disorders: Secondary | ICD-10-CM

## 2013-10-16 DIAGNOSIS — Z23 Encounter for immunization: Secondary | ICD-10-CM

## 2013-10-16 LAB — LIPID PANEL
Cholesterol: 125 mg/dL (ref 0–200)
HDL: 54.3 mg/dL (ref >39.00)
Total CHOL/HDL Ratio: 2
Triglycerides: 64 mg/dL (ref 0.0–149.0)

## 2013-10-16 LAB — ALT: ALT: 25 U/L (ref 0–35)

## 2013-10-16 NOTE — Assessment & Plan Note (Addendum)
Did not get to see a nutritionist. Based on the last FLP she started Lipitor, no apparent side effects. Plan: Labs, self education about diet and exercise see instructions

## 2013-10-16 NOTE — Progress Notes (Addendum)
  Subjective:    Patient ID: Lori Joseph, female    DOB: Aug 21, 1941, 72 y.o.   MRN: [id number]  HPI Routine office visit Based on the last cholesterol panel, started Lipitor,Good compliance. Also complaining of right dorsum of the foot swelling last week, stay swollen several days now back to normal.  Past Medical History  Diagnosis Date  . HYPERTENSION   . OSTEOPENIA   . BCC (basal cell carcinoma of skin)     sees Dr Terri Piedra  . HYPERLIPIDEMIA   . Allergic rhinitis   . H/O retinal detachment   . H/O acute pancreatitis      Past Surgical History  Procedure Laterality Date  . Tubal ligation    . 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 Denies her R foot  to be red, warm or painful Not recent prolonged trips (Did go to [place] 3 months ago ) No chest pain or shortness or breath. No calf pain-swelling    Objective:   Physical Exam BP 138/84  Pulse 60  Temp(Src) 97.6 F (36.4 C) (Tympanic)  Resp 18  Wt 221 lb (100.245 kg)  SpO2 95% General -- alert, well-developed, NAD.   Extremities-- no pretibial edema bilaterally, calves symmetric and not tender, good pedal pulse. R foot normal to inspection-palpation  Neurologic--  alert & oriented X3. Speech normal, gait normal, strength normal in all extremities.  Psych-- Cognition and judgment appear intact. Cooperative with normal attention span and concentration. No anxious appearing , no depressed appearing.      Assessment & Plan:   Right foot  swelling--no recent trips, no calf pain or swelling, doubt a  serious issue. Recommend observation.

## 2013-10-16 NOTE — Patient Instructions (Addendum)
Get your blood work before you leave  Next visit in 4-5 months  for a   follow up (30 minutes) No Fasting Please make an appointment     If you need more information about cholesterol, diet or hypertension, the American Heart Association is a great resource online at:  [employer]

## 2013-10-17 ENCOUNTER — Encounter: Payer: Self-pay | Admitting: Internal Medicine

## 2013-11-11 ENCOUNTER — Other Ambulatory Visit: Payer: Self-pay | Admitting: Internal Medicine

## 2013-11-12 NOTE — Telephone Encounter (Signed)
rx refilled per protocol. DJR  

## 2013-11-26 DIAGNOSIS — Z923 Personal history of irradiation: Secondary | ICD-10-CM

## 2013-11-26 HISTORY — DX: Personal history of irradiation: Z92.3

## 2013-11-30 ENCOUNTER — Other Ambulatory Visit: Payer: Self-pay | Admitting: Internal Medicine

## 2013-12-01 MED ORDER — OLMESARTAN MEDOXOMIL 40 MG PO TABS: 40.0000 mg | ORAL_TABLET | Freq: Every day | ORAL | Status: DC

## 2013-12-10 ENCOUNTER — Telehealth: Payer: Self-pay | Admitting: *Deleted

## 2013-12-10 NOTE — Telephone Encounter (Signed)
Prior authorization completed regarding pt's Benicar. Pt notified.

## 2013-12-14 ENCOUNTER — Ambulatory Visit: Payer: Medicare Other

## 2013-12-14 DIAGNOSIS — Z7982 Long term (current) use of aspirin: Secondary | ICD-10-CM | POA: Insufficient documentation

## 2013-12-14 DIAGNOSIS — Z803 Family history of malignant neoplasm of breast: Secondary | ICD-10-CM | POA: Insufficient documentation

## 2013-12-14 DIAGNOSIS — C50419 Malignant neoplasm of upper-outer quadrant of unspecified female breast: Secondary | ICD-10-CM | POA: Insufficient documentation

## 2013-12-15 ENCOUNTER — Ambulatory Visit: Payer: Medicare Other

## 2013-12-16 ENCOUNTER — Ambulatory Visit: Payer: Medicare Other

## 2013-12-17 ENCOUNTER — Ambulatory Visit: Payer: Medicare Other

## 2013-12-18 ENCOUNTER — Ambulatory Visit: Payer: Medicare Other

## 2013-12-21 ENCOUNTER — Ambulatory Visit: Payer: Medicare Other

## 2013-12-22 ENCOUNTER — Ambulatory Visit: Payer: Medicare Other

## 2013-12-23 ENCOUNTER — Ambulatory Visit: Payer: Medicare Other

## 2013-12-24 ENCOUNTER — Ambulatory Visit: Payer: Medicare Other

## 2013-12-25 ENCOUNTER — Ambulatory Visit: Payer: Medicare Other

## 2013-12-28 ENCOUNTER — Ambulatory Visit: Payer: Medicare Other

## 2013-12-29 ENCOUNTER — Ambulatory Visit: Payer: Medicare Other

## 2014-01-05 ENCOUNTER — Ambulatory Visit (INDEPENDENT_AMBULATORY_CARE_PROVIDER_SITE_OTHER): Payer: Medicare Other | Admitting: Internal Medicine

## 2014-01-05 ENCOUNTER — Ambulatory Visit (HOSPITAL_BASED_OUTPATIENT_CLINIC_OR_DEPARTMENT_OTHER)
Admission: RE | Admit: 2014-01-05 | Discharge: 2014-01-05 | Disposition: A | Discharge status: Home / Self Care | Payer: Medicare Other | Source: Ambulatory Visit | Attending: Internal Medicine | Admitting: Internal Medicine

## 2014-01-05 ENCOUNTER — Encounter: Payer: Self-pay | Admitting: Internal Medicine

## 2014-01-05 VITALS — BP 130/72 | HR 66 | Temp 97.9°F | Wt 225.0 lb

## 2014-01-05 DIAGNOSIS — R079 Chest pain, unspecified: Secondary | ICD-10-CM

## 2014-01-05 DIAGNOSIS — I10 Essential (primary) hypertension: Secondary | ICD-10-CM | POA: Insufficient documentation

## 2014-01-05 MED ORDER — ESOMEPRAZOLE MAGNESIUM 40 MG PO CPDR: 40.0000 mg | DELAYED_RELEASE_CAPSULE | Freq: Every day | ORAL | Status: DC

## 2014-01-05 NOTE — Progress Notes (Signed)
   Subjective:    Patient ID: Lori Joseph, female    DOB: [birth year], 73 y.o.   MRN: [id number]  DOS:  01/05/2014 Acute visit, CC "pain", "I think is my esophagus".  Symptoms started 2 days ago approximately one hour after she had dinner: Has a steady pain in the anterior upper chest bilaterally, intensity is around 4-10, is not burning, dull or sharp rather "pressure", "heavy" . Since the pain started, it has not stopped; she has been able to eat, eating does not make any difference, denies dysphagia, pain more noticeable when she walks?. She took Nexium one time and rolaids: Did not help. Today the pain is not as bad but she feels is going (radiating) to her back.   Past Medical History  Diagnosis Date  . HYPERTENSION   . OSTEOPENIA   . BCC (basal cell carcinoma of skin)     sees Dr Allyson Sabal  . HYPERLIPIDEMIA   . Allergic rhinitis   . H/O retinal detachment   . H/O acute pancreatitis     Past Surgical History  Procedure Laterality Date  . Tubal ligation    . Cholecystectomy      History   Social History  . Marital Status: Married    Spouse Name: Ruthann Cancer    Number of Children: 2  . Years of Education: N/A   Occupational History  . working    Social History Main Topics  . Smoking status: Never Smoker   . Smokeless tobacco: Not on file  . Alcohol Use: Yes     Comment: rarely wine  . Drug Use: Not on file  . Sexual Activity: Not on file   Other Topics Concern  . Not on file   Social History Narrative  . No narrative on file    ROS denies fever, chills, cough or difficulty breathing. Appetite is normal, no nausea, diarrhea or abdominal pain. Denies any leg swelling or calf pain.     Objective:   Physical Exam BP 130/72  Pulse 66  Temp(Src) 97.9 F (36.6 C)  Wt 225 lb (102.059 kg)  SpO2 99% General -- alert, well-developed, NAD.  Neck --no mass or  LAD HEENT-- Not pale.   Lungs -- normal respiratory effort, no intercostal retractions, no accessory  muscle use, andfew ronchi Heart-- normal rate, regular rhythm, no murmur.  Abdomen-- Not distended, good bowel sounds,soft, non-tender. Extremities-- no pretibial edema bilaterally  Neurologic--  alert & oriented X3. Speech normal, gait normal, strength normal in all extremities.  Psych-- Cognition and judgment appear intact. Cooperative with normal attention span and concentration. No anxious or depressed appearing.      Assessment & Plan:

## 2014-01-05 NOTE — Patient Instructions (Signed)
Get the XR at [facility], corner of [address] (10 minutes form here); they are open 24/7 [place], [place] [address] [phone]  Nexium daily, one tablet before breakfast If the pain is not improving in the next few days let me know. If  symptoms are severe, you get short of breath---. either call or go to the ER.

## 2014-01-05 NOTE — Assessment & Plan Note (Addendum)
73 year old lady with no history of diabetes, treated  hypertension and high cholesterol, no family history heart disease presents with atypical chest pain. EKG showed normal sinus rhythm, she is in no distress, vital signs stable. Symptoms started shortly after she ate, at some point she felt that something was " stuck in there". Plan: PPIs daily, Chest x-ray If she's not improving soon she will let me know, will need further eval. ---> CT chest, GI referral?

## 2014-01-05 NOTE — Progress Notes (Signed)
Pre visit review using our clinic review tool, if applicable. No additional management support is needed unless otherwise documented below in the visit note. 

## 2014-01-11 ENCOUNTER — Telehealth: Payer: Self-pay | Admitting: General Practice

## 2014-01-11 MED ORDER — PRAVASTATIN SODIUM 40 MG PO TABS: 40.0000 mg | ORAL_TABLET | Freq: Every day | ORAL | Status: DC

## 2014-01-11 NOTE — Telephone Encounter (Signed)
Pt called stating that the lipitor is her problem. Causing aches and pt to "not feel right" Pt would like to change medication. Pt was just seen on 2/10 by [facility]

## 2014-01-11 NOTE — Telephone Encounter (Signed)
Pt notified of plan, rx sent

## 2014-01-11 NOTE — Telephone Encounter (Signed)
Advise patient:  Discontinue Lipitor, if she is not feeling completely well let me know In 3 weeks start Pravachol 40 mg one by mouth daily, #30 and 2 refill. Office visit in 3 months

## 2014-02-02 ENCOUNTER — Ambulatory Visit: Payer: Medicare Other | Admitting: Internal Medicine

## 2014-02-02 DIAGNOSIS — Z0289 Encounter for other administrative examinations: Secondary | ICD-10-CM

## 2014-02-18 ENCOUNTER — Telehealth: Payer: Self-pay | Admitting: Internal Medicine

## 2014-02-18 NOTE — Telephone Encounter (Signed)
Patient states that she has been having lower back pain/kidney pain ever since starting the new cholesterol medication that Dr. Larose Kells has put her on. She states that she has been hurting for the past 6 days. She wants to know if this is a side effect to the new medication?

## 2014-02-19 ENCOUNTER — Encounter: Payer: Self-pay | Admitting: Physician Assistant

## 2014-02-19 ENCOUNTER — Ambulatory Visit (INDEPENDENT_AMBULATORY_CARE_PROVIDER_SITE_OTHER): Payer: Medicare Other | Admitting: Physician Assistant

## 2014-02-19 VITALS — BP 157/84 | HR 70 | Temp 98.3°F | Resp 16 | Ht 63.5 in | Wt 226.5 lb

## 2014-02-19 DIAGNOSIS — M545 Low back pain, unspecified: Secondary | ICD-10-CM | POA: Insufficient documentation

## 2014-02-19 LAB — POCT URINALYSIS DIPSTICK
Bilirubin, UA: NEGATIVE
Blood, UA: NEGATIVE
Glucose, UA: NEGATIVE
Ketones, UA: NEGATIVE
LEUKOCYTES UA: NEGATIVE
Nitrite, UA: NEGATIVE
PROTEIN UA: NEGATIVE
Spec Grav, UA: <=1.005
UROBILINOGEN UA: 0.2
pH, UA: 6.5

## 2014-02-19 NOTE — Progress Notes (Signed)
Pre visit review using our clinic review tool, if applicable. No additional management support is needed unless otherwise documented below in the visit note/SLS  

## 2014-02-19 NOTE — Patient Instructions (Signed)
Increase fluid intake.  Rest.  Avoid heavy lifting and over exertion.  Stop Pravachol for 1 week.  Monitor symptoms for improvement.  Your urine dip was negative for infection.  You do not have many symptoms of a classic UTI.  I will still send your urine for culture.  We will treat if indicated by your results. Your symptoms are likely musculoskeletal in nature or related to the Pravachol.  You can alternate tylenol and ibuprofen if needed for pain.  Apply warm compresses to the area.  If symptoms acutely worsen over the weekend, please proceed to an Urgent Care of the ER.

## 2014-02-19 NOTE — Assessment & Plan Note (Signed)
Urine dipstick unremarkable.  Will send for culture. Physical examination unremarkable.  Possibly LBP and myalgias are reaction to Pravachol.  Stop medication for 1 week.  Monitor symptoms. Alternate tylenol and ibuprofen if needed for moderate pain. Topical Aspercreme.  Avoid heavy lifting or overexertion.  Follow-up with PCP.

## 2014-02-19 NOTE — Addendum Note (Signed)
Addended by: Rockwell Germany on: 02/19/2014 12:29 PM   Modules accepted: Orders

## 2014-02-19 NOTE — Progress Notes (Signed)
Patient presents to clinic today c/o 1 week of low back pain. Patient denies urinary frequency, urinary urgency, incomplete bladder emptying, dysuria, hematuria, nausea, vomiting, flank pain or fever.  Patient denies trauma, heavy lifting or overexertion.  Patient has history of intolerance to Statins.  Was recently placed on Pravachol due to decreased incidence of myalgias.  Patient having some mild aches of other joints as well.  Denies swelling or redness of joints.  Past Medical History  Diagnosis Date  . HYPERTENSION   . OSTEOPENIA   . BCC (basal cell carcinoma of skin)     sees Dr Allyson Sabal  . HYPERLIPIDEMIA   . Allergic rhinitis   . H/O retinal detachment   . H/O acute pancreatitis     Current Outpatient Prescriptions on File Prior to Visit  Medication Sig Dispense Refill  . aspirin 81 MG EC tablet Take 81 mg by mouth daily. Swallow whole.       . esomeprazole (NEXIUM) 40 MG capsule Take 1 capsule (40 mg total) by mouth daily.  30 capsule  3  . fluticasone (FLONASE) 50 MCG/ACT nasal spray Place 2 sprays into the nose daily as needed.      . latanoprost (XALATAN) 0.005 % ophthalmic solution Place 1 drop into both eyes at bedtime.      . Multiple Vitamin (MULTIVITAMIN) tablet Take 1 tablet by mouth daily.        Marland Kitchen olmesartan (BENICAR) 40 MG tablet Take 1 tablet (40 mg total) by mouth daily.  90 tablet  0  . pravastatin (PRAVACHOL) 40 MG tablet Take 1 tablet (40 mg total) by mouth daily.  30 tablet  2   No current facility-administered medications on file prior to visit.    Allergies  Allergen Reactions  . Carvedilol     REACTION: leg pain, edema  . Codeine   . Felodipine     REACTION: HA, insomnia  . Lisinopril     REACTION: HA, SWELLING  . Losartan Potassium     REACTION: cough    No family history on file.  History   Social History  . Marital Status: Married    Spouse Name: Ruthann Cancer    Number of Children: 2  . Years of Education: N/A   Occupational History  .  working    Social History Main Topics  . Smoking status: Never Smoker   . Smokeless tobacco: None  . Alcohol Use: Yes     Comment: rarely wine  . Drug Use: None  . Sexual Activity: None   Other Topics Concern  . None   Social History Narrative  . None   Review of Systems - See HPI.  All other ROS are negative.  BP 157/84  Pulse 70  Temp(Src) 98.3 F (36.8 C) (Oral)  Resp 16  Ht 5' 3.5" (1.613 m)  Wt 226 lb 8 oz (102.74 kg)  BMI 39.49 kg/m2  SpO2 97%  Physical Exam  Vitals reviewed. Constitutional: She is oriented to person, place, and time and well-developed, well-nourished, and in no distress.  HENT:  Head: Normocephalic and atraumatic.  Eyes: Conjunctivae are normal. Pupils are equal, round, and reactive to light.  Neck: Neck supple.  Neurological: She is alert and oriented to person, place, and time.  Skin: Skin is warm and dry. No rash noted.  Psychiatric: Affect normal.   Assessment/Plan: LBP (low back pain) Urine dipstick unremarkable.  Will send for culture. Physical examination unremarkable.  Possibly LBP and myalgias are reaction to Pravachol.  Stop medication for 1 week.  Monitor symptoms. Alternate tylenol and ibuprofen if needed for moderate pain. Topical Aspercreme.  Avoid heavy lifting or overexertion.  Follow-up with PCP.

## 2014-02-19 NOTE — Telephone Encounter (Signed)
Patient has appt scheduled this morning w/Cody Hassell Done, [facility]

## 2014-02-27 ENCOUNTER — Other Ambulatory Visit: Payer: Self-pay | Admitting: Internal Medicine

## 2014-03-05 ENCOUNTER — Telehealth: Payer: Self-pay | Admitting: Internal Medicine

## 2014-03-05 DIAGNOSIS — M549 Dorsalgia, unspecified: Secondary | ICD-10-CM

## 2014-03-05 MED ORDER — CYCLOBENZAPRINE HCL 5 MG PO TABS
5.0000 mg | ORAL_TABLET | Freq: Three times a day (TID) | ORAL | Status: DC | PRN
Start: 2014-03-05 — End: 2014-04-16

## 2014-03-05 NOTE — Telephone Encounter (Signed)
Okay, start Flexeril 5 mg one by mouth each bedtime when necessary, #30, one refill. Arrange a routine followup for next month

## 2014-03-05 NOTE — Telephone Encounter (Signed)
Patient called and stated that she is still having lower back pain and wanted to see if dr Larose Kells would send her in a muscle relaxer. Please advise.

## 2014-03-05 NOTE — Telephone Encounter (Signed)
I saw her 07-2013 with left buttock pain, is that what she is referring to?Marland Kitchen Also was seen with chest pain, still having that issue?.  Please call the patient and let me know

## 2014-03-05 NOTE — Telephone Encounter (Signed)
Pt notified and scheduled.

## 2014-03-05 NOTE — Telephone Encounter (Signed)
Not related to the left buttock pain or the chest pain. Pt states was seen by PA- cody last week for back pain, still has low back pain. Pt thinks its the cholesterol medication stop taking for 1 week still had the back pain, started taking meds again and back pain has gotten worse.  Pt also states that you prescribed her something for her back year ago , requesting same meds.

## 2014-03-16 ENCOUNTER — Telehealth: Payer: Self-pay | Admitting: Family Medicine

## 2014-03-16 NOTE — Telephone Encounter (Signed)
Advise patient: Take Flexeril which she already has She is allergic to codeine, can she take Vicodin or Ultram as an alternative pain medication? Please let me know.

## 2014-03-16 NOTE — Telephone Encounter (Signed)
Pt states that she is still having back pain, pain medication not working.  She saw Elyn Aquas, PA recently.  Please advise.

## 2014-03-17 MED ORDER — TRAMADOL HCL 50 MG PO TABS: 50.0000 mg | ORAL_TABLET | Freq: Three times a day (TID) | ORAL | Status: DC | PRN

## 2014-03-17 NOTE — Telephone Encounter (Signed)
rx faxed to [facility] [address]

## 2014-03-17 NOTE — Telephone Encounter (Signed)
Pt not sure about the vicodin or ultram- pt states that as long as its mild she can try it .

## 2014-03-17 NOTE — Telephone Encounter (Signed)
Pt notified- verbalized understanding.

## 2014-03-17 NOTE — Telephone Encounter (Signed)
We can try ultram, prescription printed. If she has any sort of reaction she is to stop immediately and let us know. Expect drowsiness, be careful. If not improving needs to be seen again.

## 2014-04-16 ENCOUNTER — Encounter: Payer: Self-pay | Admitting: Internal Medicine

## 2014-04-16 ENCOUNTER — Ambulatory Visit (INDEPENDENT_AMBULATORY_CARE_PROVIDER_SITE_OTHER): Payer: Medicare Other | Admitting: Internal Medicine

## 2014-04-16 VITALS — BP 134/70 | HR 66 | Temp 97.8°F | Wt 223.0 lb

## 2014-04-16 DIAGNOSIS — E785 Hyperlipidemia, unspecified: Secondary | ICD-10-CM

## 2014-04-16 DIAGNOSIS — I10 Essential (primary) hypertension: Secondary | ICD-10-CM

## 2014-04-16 DIAGNOSIS — M549 Dorsalgia, unspecified: Secondary | ICD-10-CM

## 2014-04-16 DIAGNOSIS — M7918 Myalgia, other site: Secondary | ICD-10-CM

## 2014-04-16 LAB — BASIC METABOLIC PANEL
BUN: 17 mg/dL (ref 6–23)
CALCIUM: 9.6 mg/dL (ref 8.4–10.5)
CO2: 29 mEq/L (ref 19–32)
Chloride: 104 mEq/L (ref 96–112)
Creatinine, Ser: 0.9 mg/dL (ref 0.4–1.2)
GFR: 66.09 mL/min (ref >60.00)
Glucose, Bld: 134 mg/dL — ABNORMAL HIGH (ref 70–99)
Potassium: 4.2 mEq/L (ref 3.5–5.1)
SODIUM: 140 meq/L (ref 135–145)

## 2014-04-16 MED ORDER — CYCLOBENZAPRINE HCL 5 MG PO TABS: 5.0000 mg | ORAL_TABLET | Freq: Three times a day (TID) | ORAL | Status: DC | PRN

## 2014-04-16 MED ORDER — PRAVASTATIN SODIUM 40 MG PO TABS: 40.0000 mg | ORAL_TABLET | Freq: Every day | ORAL | Status: DC

## 2014-04-16 NOTE — Progress Notes (Signed)
Pre-visit discussion using our clinic review tool. No additional management support is needed unless otherwise documented below in the visit note.  

## 2014-04-16 NOTE — Assessment & Plan Note (Signed)
Responded well to Pravachol, refill sent

## 2014-04-16 NOTE — Assessment & Plan Note (Signed)
Symptoms better with Flexeril, refill sent

## 2014-04-16 NOTE — Assessment & Plan Note (Signed)
Well-controlled,  check a BMP 

## 2014-04-16 NOTE — Progress Notes (Signed)
Subjective:    Patient ID: Lori Joseph, female    DOB: August 22, 1941, 73 y.o.   MRN: [id number]  DOS:  04/16/2014 Type of  visit: followup Was seen with chest pain few months ago, symptoms resolve after a trial w/ PPIs. Was having back pain and spasms, held Pravachol and self tried muscle relaxants,responded well to Flexeril. She is back on her cholesterol medication and feeling well   ROS Denies nausea, vomiting, diarrhea. No GERD symptoms or dysphagia  Past Medical History  Diagnosis Date  . HYPERTENSION   . OSTEOPENIA   . BCC (basal cell carcinoma of skin)     sees Dr Allyson Sabal  . HYPERLIPIDEMIA   . Allergic rhinitis   . H/O retinal detachment   . H/O acute pancreatitis     Past Surgical History  Procedure Laterality Date  . Tubal ligation    . Cholecystectomy      History   Social History  . Marital Status: Married    Spouse Name: Ruthann Cancer    Number of Children: 2  . Years of Education: N/A   Occupational History  . working    Social History Main Topics  . Smoking status: Never Smoker   . Smokeless tobacco: Not on file  . Alcohol Use: Yes     Comment: rarely wine  . Drug Use: Not on file  . Sexual Activity: Not on file   Other Topics Concern  . Not on file   Social History Narrative  . No narrative on file        Medication List       This list is accurate as of: 04/16/14 11:59 PM.  Always use your most recent med list.               aspirin 81 MG EC tablet  Take 81 mg by mouth daily. Swallow whole.     BENICAR 40 MG tablet  Generic drug:  olmesartan  TAKE 1 TABLET (40 MG TOTAL) BY MOUTH DAILY.     cyclobenzaprine 5 MG tablet  Commonly known as:  FLEXERIL  Take 1 tablet (5 mg total) by mouth 3 (three) times daily as needed for muscle spasms.     fluticasone 50 MCG/ACT nasal spray  Commonly known as:  FLONASE  Place 2 sprays into the nose daily as needed.     latanoprost 0.005 % ophthalmic solution  Commonly known as:  XALATAN    Place 1 drop into both eyes at bedtime.     multivitamin tablet  Take 1 tablet by mouth daily.     pravastatin 40 MG tablet  Commonly known as:  PRAVACHOL  Take 1 tablet (40 mg total) by mouth daily.     traMADol 50 MG tablet  Commonly known as:  ULTRAM  Take 1 tablet (50 mg total) by mouth every 8 (eight) hours as needed.           Objective:   Physical Exam BP 134/70  Pulse 66  Temp(Src) 97.8 F (36.6 C) (Oral)  Wt 223 lb (101.152 kg)  SpO2 97%  General -- alert, well-developed, NAD.  Lungs -- normal respiratory effort, no intercostal retractions, no accessory muscle use, and normal breath sounds.  Heart-- normal rate, regular rhythm, no murmur.   Extremities-- no pretibial edema bilaterally  Neurologic--  alert & oriented X3. Speech normal, gait normal, strength normal in all extremities.  Psych-- Cognition and judgment appear intact. Cooperative with normal attention span and concentration. No  anxious or depressed appearing.         Assessment & Plan:

## 2014-04-16 NOTE — Patient Instructions (Signed)
Get your blood work before you leave   Next visit is for a physical exam by 07-2014, fasting Please make an appointment    

## 2014-04-20 ENCOUNTER — Telehealth: Payer: Self-pay | Admitting: Internal Medicine

## 2014-04-20 ENCOUNTER — Encounter: Payer: Self-pay | Admitting: *Deleted

## 2014-04-20 NOTE — Telephone Encounter (Signed)
Relevant patient education assigned to patient using Emmi. ° °

## 2014-05-14 ENCOUNTER — Ambulatory Visit: Payer: Medicare Other | Admitting: Internal Medicine

## 2014-08-05 ENCOUNTER — Ambulatory Visit (INDEPENDENT_AMBULATORY_CARE_PROVIDER_SITE_OTHER): Payer: Medicare Other | Admitting: Internal Medicine

## 2014-08-05 ENCOUNTER — Encounter: Payer: Self-pay | Admitting: Internal Medicine

## 2014-08-05 ENCOUNTER — Other Ambulatory Visit: Payer: Self-pay

## 2014-08-05 VITALS — BP 128/78 | HR 57 | Temp 97.8°F | Ht 63.0 in | Wt 226.2 lb

## 2014-08-05 DIAGNOSIS — Z1231 Encounter for screening mammogram for malignant neoplasm of breast: Secondary | ICD-10-CM

## 2014-08-05 DIAGNOSIS — M7918 Myalgia, other site: Secondary | ICD-10-CM

## 2014-08-05 DIAGNOSIS — Z0000 Encounter for general adult medical examination without abnormal findings: Secondary | ICD-10-CM

## 2014-08-05 DIAGNOSIS — L309 Dermatitis, unspecified: Secondary | ICD-10-CM

## 2014-08-05 DIAGNOSIS — I10 Essential (primary) hypertension: Secondary | ICD-10-CM

## 2014-08-05 DIAGNOSIS — E785 Hyperlipidemia, unspecified: Secondary | ICD-10-CM

## 2014-08-05 DIAGNOSIS — L259 Unspecified contact dermatitis, unspecified cause: Secondary | ICD-10-CM

## 2014-08-05 DIAGNOSIS — Z23 Encounter for immunization: Secondary | ICD-10-CM

## 2014-08-05 LAB — LIPID PANEL
Cholesterol: 161 mg/dL (ref 0–200)
HDL: 53.2 mg/dL (ref >39.00)
LDL Cholesterol: 94 mg/dL (ref 0–99)
NonHDL: 107.8
TRIGLYCERIDES: 71 mg/dL (ref 0.0–149.0)
Total CHOL/HDL Ratio: 3
VLDL: 14.2 mg/dL (ref 0.0–40.0)

## 2014-08-05 LAB — CBC WITH DIFFERENTIAL/PLATELET
BASOS ABS: 0 10*3/uL (ref 0.0–0.1)
Basophils Relative: 0.4 % (ref 0.0–3.0)
EOS ABS: 0.1 10*3/uL (ref 0.0–0.7)
Eosinophils Relative: 1.5 % (ref 0.0–5.0)
HEMATOCRIT: 38.2 % (ref 36.0–46.0)
HEMOGLOBIN: 12.8 g/dL (ref 12.0–15.0)
LYMPHS ABS: 1.5 10*3/uL (ref 0.7–4.0)
Lymphocytes Relative: 33.5 % (ref 12.0–46.0)
MCHC: 33.5 g/dL (ref 30.0–36.0)
MCV: 90.4 fl (ref 78.0–100.0)
MONOS PCT: 5.4 % (ref 3.0–12.0)
Monocytes Absolute: 0.2 10*3/uL (ref 0.1–1.0)
NEUTROS ABS: 2.6 10*3/uL (ref 1.4–7.7)
Neutrophils Relative %: 59.2 % (ref 43.0–77.0)
Platelets: 177 10*3/uL (ref 150.0–400.0)
RBC: 4.22 Mil/uL (ref 3.87–5.11)
RDW: 13.4 % (ref 11.5–15.5)
WBC: 4.3 10*3/uL (ref 4.0–10.5)

## 2014-08-05 MED ORDER — FLUTICASONE PROPIONATE 0.005 % EX OINT: 1.0000 "application " | TOPICAL_OINTMENT | Freq: Three times a day (TID) | CUTANEOUS | Status: DC | PRN

## 2014-08-05 NOTE — Assessment & Plan Note (Signed)
Good compliance with medication, check labs

## 2014-08-05 NOTE — Assessment & Plan Note (Addendum)
Td 2008 pneumonia shot 2008 prevnar-- today Shingles immunization----> got it 2012 Flu shot today   Used to see  Dr. Landry Dyke Dr [place] 2014 Had a MMG 04-2012, next schedule for this week   Cscope @ [facility] 01-2008 : 2 polyps ----> tubular adenomas w/  low grade dysplasia colonoscopy 08/2010, very small polyps, next 5 years  Diet and exercise discussed, see AVS

## 2014-08-05 NOTE — Progress Notes (Signed)
Subjective:    Patient ID: Lori Joseph, female    DOB: Dec 03, 1940, 73 y.o.   MRN: [id number]  DOS:  08/05/2014 Type of visit - description :   Here for Medicare AWV:   1. Risk factors based on Past M, S, F history: reviewed   2. Physical Activities: sedentary, desk job, little yard work, no routine exercise   3. Depression/mood: neg screening 4. Hearing: no problems noted or reported   5. ADL's: independent   6. Fall Risk: no recent falls, prevention discussed   7. Home Safety: does feel safe at home   8. Height, weight, &visual acuity: see VS, sees ophtalmology q 4 months   9. Counseling: yes   10. Labs ordered based on risk factors: yes   11. Referral Coordination, if needed   12. Care Plan, see assessment and plan   13. Cognitive Assessment: motor skills, cognition and memory seem normal   in addition, we discussed the following Had pain at the buttock, symptoms resolved. His of eczema in the legs, symptoms resurface, would like a prescription rather than go back to dermatologist High cholesterol, good medication compliance Hypertension, good medication compliance, no ambulatory BPs   ROS Denies chest pain or difficulty breathing No nausea, vomiting, diarrhea No dysuria, gross hematuria or difficulty urinating  Past Medical History  Diagnosis Date  . HYPERTENSION   . OSTEOPENIA   . BCC (basal cell carcinoma of skin)     sees Dr Allyson Sabal  . HYPERLIPIDEMIA   . Allergic rhinitis   . H/O retinal detachment   . H/O acute pancreatitis     Past Surgical History  Procedure Laterality Date  . Tubal ligation    . Cholecystectomy    . Laser eye surgery, detached retina Left     History   Social History  . Marital Status: Married    Spouse Name: Ruthann Cancer    Number of Children: 2  . Years of Education: N/A   Occupational History  . working    Social History Main Topics  . Smoking status: Never Smoker   . Smokeless tobacco: Not on file  . Alcohol Use: Yes   Comment: rarely wine  . Drug Use: No  . Sexual Activity: Not on file   Other Topics Concern  . Not on file   Social History Narrative   Lives w/ husband     Family History  Problem Relation Age of Onset  . Colon cancer Neg Hx   . Breast cancer Sister 15  . CAD Neg Hx   . Diabetes Neg Hx        Medication List       This list is accurate as of: 08/05/14 11:59 PM.  Always use your most recent med list.               aspirin 81 MG EC tablet  Take 81 mg by mouth daily. Swallow whole.     BENICAR 40 MG tablet  Generic drug:  olmesartan  TAKE 1 TABLET (40 MG TOTAL) BY MOUTH DAILY.     fluticasone 0.005 % ointment  Commonly known as:  CUTIVATE  Apply 1 application topically 3 (three) times daily as needed.     fluticasone 50 MCG/ACT nasal spray  Commonly known as:  FLONASE  Place 2 sprays into the nose daily as needed.     latanoprost 0.005 % ophthalmic solution  Commonly known as:  XALATAN  Place 1 drop into both eyes at  bedtime.     multivitamin tablet  Take 1 tablet by mouth daily.     pravastatin 40 MG tablet  Commonly known as:  PRAVACHOL  Take 1 tablet (40 mg total) by mouth daily.     traMADol 50 MG tablet  Commonly known as:  ULTRAM  Take 1 tablet (50 mg total) by mouth every 8 (eight) hours as needed.           Objective:   Physical Exam BP 128/78  Pulse 57  Temp(Src) 97.8 F (36.6 C) (Oral)  Ht 5\' 3"  (1.6 m)  Wt 226 lb 4 oz (102.626 kg)  BMI 40.09 kg/m2  SpO2 98% General -- alert, well-developed, NAD.  Neck --no thyromegaly , normal carotid pulse  HEENT-- Not pale.  Lungs -- normal respiratory effort, no intercostal retractions, no accessory muscle use, and normal breath sounds.  Heart-- normal rate, regular rhythm, no murmur.  Abdomen-- Not distended, good bowel sounds,soft, non-tender. Extremities-- no pretibial edema bilaterally ; Has a patch of dry slightly red skin at both pretibial areas Neurologic--  alert & oriented X3. Speech  normal, gait appropriate for age, strength symmetric and appropriate for age.  Psych-- Cognition and judgment appear intact. Cooperative with normal attention span and concentration. No anxious or depressed appearing.     Assessment & Plan:

## 2014-08-05 NOTE — Patient Instructions (Signed)
Get your blood work before you leave    Please come back to the office 6 to 8 months  for a routine office visit , no fasting    Stop by the front desk and schedule the visit    If you need more information about a healthy diet,  visit  the American Heart Association, it  is a Financial planner at:  http://www.richard-flynn.net/     Fall Prevention and Home Safety Falls cause injuries and can affect all age groups. It is possible to use preventive measures to significantly decrease the likelihood of falls. There are many simple measures which can make your home safer and prevent falls. OUTDOORS  Repair cracks and edges of walkways and driveways.  Remove high doorway thresholds.  Trim shrubbery on the main path into your home.  Have good outside lighting.  Clear walkways of tools, rocks, debris, and clutter.  Check that handrails are not broken and are securely fastened. Both sides of steps should have handrails.  Have leaves, snow, and ice cleared regularly.  Use sand or salt on walkways during winter months.  In the garage, clean up grease or oil spills. BATHROOM  Install night lights.  Install grab bars by the toilet and in the tub and shower.  Use non-skid mats or decals in the tub or shower.  Place a plastic non-slip stool in the shower to sit on, if needed.  Keep floors dry and clean up all water on the floor immediately.  Remove soap buildup in the tub or shower on a regular basis.  Secure bath mats with non-slip, double-sided rug tape.  Remove throw rugs and tripping hazards from the floors. BEDROOMS  Install night lights.  Make sure a bedside light is easy to reach.  Do not use oversized bedding.  Keep a telephone by your bedside.  Have a firm chair with side arms to use for getting dressed.  Remove throw rugs and tripping hazards from the floor. KITCHEN  Keep handles on pots and pans turned toward the center of the stove. Use back burners  when possible.  Clean up spills quickly and allow time for drying.  Avoid walking on wet floors.  Avoid hot utensils and knives.  Position shelves so they are not too high or low.  Place commonly used objects within easy reach.  If necessary, use a sturdy step stool with a grab bar when reaching.  Keep electrical cables out of the way.  Do not use floor polish or wax that makes floors slippery. If you must use wax, use non-skid floor wax.  Remove throw rugs and tripping hazards from the floor. STAIRWAYS  Never leave objects on stairs.  Place handrails on both sides of stairways and use them. Fix any loose handrails. Make sure handrails on both sides of the stairways are as long as the stairs.  Check carpeting to make sure it is firmly attached along stairs. Make repairs to worn or loose carpet promptly.  Avoid placing throw rugs at the top or bottom of stairways, or properly secure the rug with carpet tape to prevent slippage. Get rid of throw rugs, if possible.  Have an electrician put in a light switch at the top and bottom of the stairs. OTHER FALL PREVENTION TIPS  Wear low-heel or rubber-soled shoes that are supportive and fit well. Wear closed toe shoes.  When using a stepladder, make sure it is fully opened and both spreaders are firmly locked. Do not climb a  closed stepladder.  Add color or contrast paint or tape to grab bars and handrails in your home. Place contrasting color strips on first and last steps.  Learn and use mobility aids as needed. Install an electrical emergency response system.  Turn on lights to avoid dark areas. Replace light bulbs that burn out immediately. Get light switches that glow.  Arrange furniture to create clear pathways. Keep furniture in the same place.  Firmly attach carpet with non-skid or double-sided tape.  Eliminate uneven floor surfaces.  Select a carpet pattern that does not visually hide the edge of steps.  Be aware of  all pets. OTHER HOME SAFETY TIPS  Set the water temperature for 120 F (48.8 C).  Keep emergency numbers on or near the telephone.  Keep smoke detectors on every level of the home and near sleeping areas. Document Released: 11/02/2002 Document Revised: 05/13/2012 Document Reviewed: 02/01/2012 [facility] Patient Information 2015 [place], [place]. This information is not intended to replace advice given to you by your health care provider. Make sure you discuss any questions you have with your health care provider.

## 2014-08-05 NOTE — Progress Notes (Signed)
Pre visit review using our clinic review tool, if applicable. No additional management support is needed unless otherwise documented below in the visit note. 

## 2014-08-05 NOTE — Assessment & Plan Note (Signed)
Resolved, will keep Ultram on his medication this in case she needs it

## 2014-08-05 NOTE — Assessment & Plan Note (Signed)
Well-controlled, last BMP satisfactory, return to the office 6-8 months

## 2014-08-12 ENCOUNTER — Ambulatory Visit: Payer: Medicare Other

## 2014-08-19 ENCOUNTER — Ambulatory Visit
Admission: RE | Admit: 2014-08-19 | Discharge: 2014-08-19 | Disposition: A | Discharge status: Home / Self Care | Payer: Medicare Other | Source: Ambulatory Visit

## 2014-08-19 DIAGNOSIS — Z1231 Encounter for screening mammogram for malignant neoplasm of breast: Secondary | ICD-10-CM

## 2014-08-23 ENCOUNTER — Other Ambulatory Visit: Payer: Self-pay | Admitting: Internal Medicine

## 2014-08-23 DIAGNOSIS — R928 Other abnormal and inconclusive findings on diagnostic imaging of breast: Secondary | ICD-10-CM

## 2014-08-25 ENCOUNTER — Other Ambulatory Visit: Payer: Self-pay

## 2014-08-25 ENCOUNTER — Other Ambulatory Visit: Payer: Self-pay | Admitting: Internal Medicine

## 2014-08-25 DIAGNOSIS — R928 Other abnormal and inconclusive findings on diagnostic imaging of breast: Secondary | ICD-10-CM

## 2014-08-26 ENCOUNTER — Other Ambulatory Visit: Payer: Self-pay | Admitting: Internal Medicine

## 2014-08-26 DIAGNOSIS — R928 Other abnormal and inconclusive findings on diagnostic imaging of breast: Secondary | ICD-10-CM

## 2014-08-27 ENCOUNTER — Ambulatory Visit
Admission: RE | Admit: 2014-08-27 | Discharge: 2014-08-27 | Disposition: A | Discharge status: Home / Self Care | Payer: Medicare Other | Source: Ambulatory Visit | Attending: Internal Medicine | Admitting: Internal Medicine

## 2014-08-27 ENCOUNTER — Other Ambulatory Visit: Payer: Self-pay | Admitting: Internal Medicine

## 2014-08-27 DIAGNOSIS — R928 Other abnormal and inconclusive findings on diagnostic imaging of breast: Secondary | ICD-10-CM

## 2014-08-27 HISTORY — PX: BREAST BIOPSY: SHX20

## 2014-08-30 ENCOUNTER — Other Ambulatory Visit: Payer: Self-pay

## 2014-08-30 ENCOUNTER — Other Ambulatory Visit: Payer: Self-pay | Admitting: Internal Medicine

## 2014-08-30 DIAGNOSIS — C50912 Malignant neoplasm of unspecified site of left female breast: Secondary | ICD-10-CM

## 2014-08-30 MED ORDER — OLMESARTAN MEDOXOMIL 40 MG PO TABS: ORAL_TABLET | ORAL | Status: DC

## 2014-08-31 ENCOUNTER — Telehealth: Payer: Self-pay | Admitting: Internal Medicine

## 2014-08-31 MED ORDER — ALPRAZOLAM 0.25 MG PO TBDP: 0.2500 mg | ORAL_TABLET | Freq: Two times a day (BID) | ORAL | Status: DC | PRN

## 2014-08-31 NOTE — Telephone Encounter (Signed)
Caller name: Allora  Relation to pt: self  Call back number: [phone] Pharmacy:[id number]   Reason for call: pt requesting zantax for anxiety patient is having a MRI this Thursday. Please advise

## 2014-08-31 NOTE — Telephone Encounter (Signed)
Prescription printed, please call patient -- needs to be careful about excessive somnolence.

## 2014-08-31 NOTE — Telephone Encounter (Signed)
Medication faxed to pharmacy 

## 2014-08-31 NOTE — Telephone Encounter (Signed)
Pt is requesting a prescription for Alprazolam for anxiety, she is having an MRI on Thursday. Please advise.

## 2014-09-02 ENCOUNTER — Ambulatory Visit
Admission: RE | Admit: 2014-09-02 | Discharge: 2014-09-02 | Disposition: A | Discharge status: Home / Self Care | Payer: Medicare Other | Source: Ambulatory Visit | Attending: Internal Medicine | Admitting: Internal Medicine

## 2014-09-02 DIAGNOSIS — C50912 Malignant neoplasm of unspecified site of left female breast: Secondary | ICD-10-CM

## 2014-09-02 MED ORDER — GADOBENATE DIMEGLUMINE 529 MG/ML IV SOLN
20.0000 mL | Freq: Once | INTRAVENOUS | Status: AC | PRN
  Administered 2014-09-02: 20 mL via INTRAVENOUS

## 2014-09-03 ENCOUNTER — Other Ambulatory Visit (INDEPENDENT_AMBULATORY_CARE_PROVIDER_SITE_OTHER): Payer: Self-pay | Admitting: General Surgery

## 2014-09-03 DIAGNOSIS — C50412 Malignant neoplasm of upper-outer quadrant of left female breast: Secondary | ICD-10-CM

## 2014-09-03 NOTE — H&P (Signed)
Lori Joseph 09/03/2014 10:33 AM Location: [facility] Patient #: [id number] DOB: [birth year] Married / Language: Lori Joseph / Race: White Female History of Present Illness Lori Klein MD; 09/03/2014 11:13 AM) Patient words: eval left breast ca.  The patient is a 73 year old female who presents with breast cancer. 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 Tumor markers include estrogen receptor positive, progesterone receptor positive and HER-2/neu negative. The patient was referred by a specialty consultant (Dr. Shon Hale. ). Initial presentation was 2 week(s) ago for an abnormal mammogram. Current diagnosis was determined by mammography, breast MRI, left breast ultrasound and core needle biopsy. This problem has not been previously treated. Symptoms do not include breast pain, nipple discharge, skin changes in the involved breast, edema of the arm or shortness of breath. Risk factors include menopause after 60 years and breast cancer in a first degree relative (sister), while risk factors do not include menarche before 64 years. Other Problems Lori Joseph, CMA; 09/03/2014 10:35 AM) Breast Cancer High blood pressure Hypercholesterolemia  Past Surgical History Lori Joseph, CMA; 09/03/2014 10:35 AM) Breast Biopsy Left. Colon Polyp Removal - Colonoscopy Gallbladder Surgery - Laparoscopic  Diagnostic Studies History Lori Joseph, CMA; 09/03/2014 10:35 AM) Colonoscopy 1-5 years ago Mammogram 1-3 years ago Pap Smear 1-5 years ago  Allergies Lori Joseph, CMA; 09/03/2014 10:35 AM) Carvedilol *BETA BLOCKERS* Codeine Phosphate *ANALGESICS - OPIOID* Felodipine *CALCIUM CHANNEL BLOCKERS* Lisinopril *ANTIHYPERTENSIVES* Losartan Potassium *ANTIHYPERTENSIVES*  Medication History (Lori Joseph, CMA; 09/03/2014 10:34 AM) Benicar (40MG Tablet, Oral  daily) Active. Pravastatin Sodium (40MG Tablet, Oral daily) Active. Latanoprost (0.005% Solution, Ophthalmic daily) Active.  Social History (Lori Joseph; 09/03/2014 10:35 AM) Caffeine use Coffee. No alcohol use No drug use Tobacco use Never smoker.  Family History Lori Joseph, [place]; 09/03/2014 10:35 AM) Breast Cancer Sister. Cerebrovascular Accident Mother, Sister. Hypertension Mother, Sister. Melanoma Sister. Thyroid problems Sister.  Pregnancy / Birth History Lori Joseph, [place]; 09/03/2014 10:35 AM) Age at menarche 42 years. Age of menopause >36 Gravida 2 Maternal age 39-25 Para 2     Review of Systems (Lori Joseph; 09/03/2014 10:35 AM) General Present- Weight Gain. Not Present- Appetite Loss, Chills, Fatigue, Fever, Night Sweats and Weight Loss. Skin Not Present- Change in Wart/Mole, Dryness, Hives, Jaundice, New Lesions, Non-Healing Wounds, Rash and Ulcer. HEENT Present- Wears glasses/contact lenses. Not Present- Earache, Hearing Loss, Hoarseness, Nose Bleed, Oral Ulcers, Ringing in the Ears, Seasonal Allergies, Sinus Pain, Sore Throat, Visual Disturbances and Yellow Eyes. Respiratory Present- Snoring. Not Present- Bloody sputum, Chronic Cough, Difficulty Breathing and Wheezing. Breast Present- Breast Mass. Not Present- Breast Pain, Nipple Discharge and Skin Changes. Cardiovascular Present- Leg Cramps. Not Present- Chest Pain, Difficulty Breathing Lying Down, Palpitations, Rapid Heart Rate, Shortness of Breath and Swelling of Extremities.  Vitals (Lori Joseph CMA; 09/03/2014 10:37 AM) 09/03/2014 10:36 AM Weight: 230 lb Height: 63in Body Surface Area: 2.15 m Body Mass Index: 40.74 kg/m Temp.: 97.62F(Temporal)  Pulse: 76 (Regular)  BP: 124/74 (Sitting, Left Arm, Standard)     Physical Exam Lori Klein MD; 09/03/2014 11:10 AM)  General Mental Status-Alert. General Appearance-Consistent with stated age. Hydration-Well  hydrated. Voice-Normal.  Head and Neck Head-normocephalic, atraumatic with no lesions or palpable masses. Trachea-midline. Thyroid Gland Characteristics - normal size and consistency.  Eye Eyeball - Bilateral-Extraocular movements intact. Sclera/Conjunctiva - Bilateral-No scleral icterus.  Chest and Lung Exam Chest  and lung exam reveals -quiet, even and easy respiratory effort with no use of accessory muscles and on auscultation, normal breath sounds, no adventitious sounds and normal vocal resonance. Inspection Chest Wall - Normal. Back - normal.  Breast Note: breasts are symmetric bilaterally. there is nipple retraction on the cancer side (left), but this has always been present as long as she can remember. no palpable masses, skin dimpling, nipple discharge, lymphadenopathy. no significant bruising from biopsy site.   Cardiovascular Cardiovascular examination reveals -normal heart sounds, regular rate and rhythm with no murmurs and normal pedal pulses bilaterally.  Abdomen Inspection Inspection of the abdomen reveals - No Hernias. Palpation/Percussion Palpation and Percussion of the abdomen reveal - Soft, Non Tender, No Rebound tenderness, No Rigidity (guarding) and No hepatosplenomegaly. Auscultation Auscultation of the abdomen reveals - Bowel sounds normal.  Neurologic Neurologic evaluation reveals -alert and oriented x 3 with no impairment of recent or remote memory. Mental Status-Normal.  Neuropsychiatric Note: See seems to have a slight bit of memory issue, but husband is present. very subtle.   Musculoskeletal Global Assessment -Note:no gross deformities.  Normal Exam - Left-Upper Extremity Strength Normal and Lower Extremity Strength Normal. Normal Exam - Right-Upper Extremity Strength Normal and Lower Extremity Strength Normal.  Lymphatic Head & Neck  General Head & Neck Lymphatics: Bilateral - Description -  Normal. Axillary  General Axillary Region: Bilateral - Description - Normal. Tenderness - Non Tender. Femoral & Inguinal  Generalized Femoral & Inguinal Lymphatics: Bilateral - Description - No Generalized lymphadenopathy.    Assessment & Plan Lori Klein MD; 09/03/2014 11:13 AM)  PRIMARY CANCER OF UPPER OUTER QUADRANT OF LEFT FEMALE BREAST (174.4  C50.412) Impression: We will plan seed guided lumpectomy and sentinel lymph node biopsy on the left. I reviewed the surgery including the timeline for seed placement and sentinel node injection. I discussed risks of surgery including bleeding, infection, pain, numbness, need for additional procedures or surgery, possible lymphedema, damage to adjacent structures, possible heart or lung issues, and possibility of having more extensive cancer than we thought.  She will also need referral to rad onc and med onc.  Current Plans Schedule for Surgery Pt Education - [facility] Breast Biopsy Instructions (FLB): discussed with patient and provided information. Referred to Oncology, for evaluation and follow up (Oncology). Referred to Radiation Oncology, for evaluation and follow up (Radiation Oncology).

## 2014-09-06 ENCOUNTER — Other Ambulatory Visit (INDEPENDENT_AMBULATORY_CARE_PROVIDER_SITE_OTHER): Payer: Self-pay | Admitting: General Surgery

## 2014-09-06 ENCOUNTER — Telehealth: Payer: Self-pay | Admitting: *Deleted

## 2014-09-06 DIAGNOSIS — C50412 Malignant neoplasm of upper-outer quadrant of left female breast: Secondary | ICD-10-CM

## 2014-09-06 NOTE — Telephone Encounter (Signed)
Pt returned my call & I confirmed 10/05/14 appt w/ her.  Mailed before appt letter, welcoming packet & intake form to pt.  Emailed Engineer, civil (consulting) at [employer] to make her aware.  Added to spreadsheet.  Took notes to HIM to scan.

## 2014-09-06 NOTE — Telephone Encounter (Signed)
Received referral from [facility].  Called and left a message for the pt to return my call so I can schedule  Med Onc appt.

## 2014-09-06 NOTE — Progress Notes (Signed)
Location of Breast Cancer: upper outer quadrant of left breast  Histology per Pathology Report:   08/27/14 Diagnosis Breast, left, needle core biopsy, mass, 1 o'clock - INVASIVE MAMMARY CARCINOMA.  Receptor Status: ER(100% +), PR (100% +), Her2-neu (negative)  Did patient present with symptoms (if so, please note symptoms) or was this found on screening mammography?: mammogram  Past/Anticipated interventions by surgeon, if any: planned seed guided lumpectomy and sentinel lymph node biopsy on 09/16/14 by Dr. Barry Dienes  Past/Anticipated interventions by medical oncology, if any: appointment with Dr. Lindi Adie on 10/05/14  Lymphedema issues, if any:  no  Pain issues, if any:  no   OBGYN history: 73 years old at menarche, Gravida 2/Para 2, age at birth of first child 13.  Used prevera and premarin for 20 years.  SAFETY ISSUES:  Prior radiation? no  Pacemaker/ICD? no  Possible current pregnancy? no  Is the patient on methotrexate? no  Current Complaints / other details:  Patient is here with her husband.      Jacqulyn Liner, RN 09/06/2014,12:26 PM

## 2014-09-08 ENCOUNTER — Encounter: Payer: Self-pay | Admitting: Radiation Oncology

## 2014-09-08 ENCOUNTER — Ambulatory Visit
Admission: RE | Admit: 2014-09-08 | Discharge: 2014-09-08 | Disposition: A | Discharge status: Home / Self Care | Payer: Medicare Other | Source: Ambulatory Visit | Attending: Radiation Oncology | Admitting: Radiation Oncology

## 2014-09-08 VITALS — BP 149/56 | HR 58 | Temp 98.0°F | Resp 20 | Ht 63.0 in | Wt 230.8 lb

## 2014-09-08 DIAGNOSIS — Z7982 Long term (current) use of aspirin: Secondary | ICD-10-CM | POA: Diagnosis not present

## 2014-09-08 DIAGNOSIS — C50412 Malignant neoplasm of upper-outer quadrant of left female breast: Secondary | ICD-10-CM | POA: Diagnosis not present

## 2014-09-08 DIAGNOSIS — Z170 Estrogen receptor positive status [ER+]: Secondary | ICD-10-CM | POA: Diagnosis not present

## 2014-09-08 DIAGNOSIS — Z803 Family history of malignant neoplasm of breast: Secondary | ICD-10-CM | POA: Diagnosis not present

## 2014-09-08 DIAGNOSIS — E785 Hyperlipidemia, unspecified: Secondary | ICD-10-CM | POA: Insufficient documentation

## 2014-09-08 DIAGNOSIS — Z79891 Long term (current) use of opiate analgesic: Secondary | ICD-10-CM | POA: Diagnosis not present

## 2014-09-08 DIAGNOSIS — I10 Essential (primary) hypertension: Secondary | ICD-10-CM | POA: Diagnosis not present

## 2014-09-08 NOTE — Progress Notes (Signed)
Please see the Nurse Progress Note in the MD Initial Consult Encounter for this patient. 

## 2014-09-08 NOTE — Addendum Note (Signed)
Encounter addended by: Jacqulyn Liner, RN on: 09/08/2014  5:35 PM<BR>     Documentation filed: Charges VN

## 2014-09-08 NOTE — Progress Notes (Signed)
Radiation Oncology         [phone] ________________________________  Initial outpatient Consultation  Name: Lori Joseph MRN: [id number]  Date: 09/08/2014  DOB: 10-14-1941  KA:JGOT Larose Kells, MD  [name], MD   REFERRING PHYSICIAN: [name], MD  DIAGNOSIS: The encounter diagnosis was Malignant neoplasm of upper-outer quadrant of female breast, left. clinical stage I  HISTORY OF PRESENT ILLNESS::Lori Joseph is a 73 y.o. female who is seen out courtesy of Dr. Barry Dienes for an opinion concerning radiation therapy as part of management of patient's recently diagnosed mammary carcinoma presenting in the upper-outer quadrant of the left breast. Earlier this year on routine screening mammography the patient was noted to have a possible mass in the upper outer quadrant of the left breast.  digital diagnostic left mammogram confirmed approximately a 0.5 cm mass in the 1:00 position of the left breast.  A biopsy was performed of this area which revealed invasive mammary carcinoma, ER and PR positive with a proliferation marker of 17%.  the lesion was felt to be grade 1 or 2 invasive ductal and MRI was performed which confirmed a solitary lesion in the upper outer quadrant of the left breast measuring approximately 1 cm in maximal diameter. Patient has been seen by general surgery was recommended breast conservation therapy for which the patient agrees. The patient is now seen in radiation oncology for further evaluation.  PREVIOUS RADIATION THERAPY: No  PAST MEDICAL HISTORY:  has a past medical history of HYPERTENSION; OSTEOPENIA; HYPERLIPIDEMIA; Allergic rhinitis; H/O retinal detachment; and H/O acute pancreatitis.    PAST SURGICAL HISTORY: Past Surgical History  Procedure Laterality Date  . Tubal ligation    . Cholecystectomy    . Laser eye surgery, detached retina Left   . Breast biopsy Left 08/27/14    FAMILY HISTORY: family history includes Breast cancer (age of onset: 65) in her  sister. There is no history of Colon cancer, CAD, or Diabetes.  SOCIAL HISTORY:  reports that she has never smoked. She does not have any smokeless tobacco history on file. She reports that she does not drink alcohol or use illicit drugs.  ALLERGIES: Carvedilol; Codeine; Felodipine; Lisinopril; and Losartan potassium  MEDICATIONS:  Current Outpatient Prescriptions  Medication Sig Dispense Refill  . fluticasone (CUTIVATE) 0.005 % ointment Apply 1 application topically 3 (three) times daily as needed.  30 g  3  . latanoprost (XALATAN) 0.005 % ophthalmic solution Place 1 drop into both eyes at bedtime.      . Melatonin 5 MG TABS Take by mouth.      . olmesartan (BENICAR) 40 MG tablet TAKE 1 TABLET (40 MG TOTAL) BY MOUTH DAILY.  90 tablet  2  . pravastatin (PRAVACHOL) 40 MG tablet Take 1 tablet (40 mg total) by mouth daily.  30 tablet  12  . ALPRAZolam (NIRAVAM) 0.25 MG dissolvable tablet Take 1-2 tablets (0.25-0.5 mg total) by mouth 2 (two) times daily as needed for anxiety.  30 tablet  0  . aspirin 81 MG EC tablet Take 81 mg by mouth daily. Swallow whole.       . fluticasone (FLONASE) 50 MCG/ACT nasal spray Place 2 sprays into the nose daily as needed.      . Multiple Vitamin (MULTIVITAMIN) tablet Take 1 tablet by mouth daily.        . traMADol (ULTRAM) 50 MG tablet Take 1 tablet (50 mg total) by mouth every 8 (eight) hours as needed.  21 tablet  0  No current facility-administered medications for this encounter.    REVIEW OF SYSTEMS:  A 15 point review of systems is documented in the electronic medical record. This was obtained by the nursing staff. However, I reviewed this with the patient to discuss relevant findings and make appropriate changes.  Prior to diagnosis the patient denied any pain in the breast area nipple discharge or bleeding she denies any new bony pain headaches dizziness or blurred vision   PHYSICAL EXAM:  height is 5' 3"  (1.6 m) and weight is 230 lb 12.8 oz (104.69 kg).  Her oral temperature is 98 F (36.7 C). Her blood pressure is 149/56 and her pulse is 58. Her respiration is 20.   BP 149/56  Pulse 58  Temp(Src) 98 F (36.7 C) (Oral)  Resp 20  Ht 5' 3"  (1.6 m)  Wt 230 lb 12.8 oz (104.69 kg)  BMI 40.89 kg/m2  General Appearance:    Alert, cooperative, no distress, appears stated age, accompanied by husband on evaluation today   Head:    Normocephalic, without obvious abnormality, atraumatic  Eyes:    PERRL, conjunctiva/corneas clear, EOM's intact,        Nose:   Nares normal, septum midline, mucosa normal, no drainage    or sinus tenderness  Throat:   Lips, mucosa, and tongue normal; teeth and gums normal  Neck:   Supple, symmetrical, trachea midline, no adenopathy;    thyroid:  no enlargement/tenderness/nodules; no carotid   bruit or JVD  Back:     Symmetric, no curvature, ROM normal, no CVA tenderness  Lungs:     Clear to auscultation bilaterally, respirations unlabored  Chest Wall:    No tenderness or deformity   Heart:    Regular rate and rhythm, S1 and S2 normal, no murmur, rub   or gallop  Breast Exam:    No tenderness, masses, or nipple abnormality of the right breast ; the left breast has mild bruising in the upper-outer aspect,  no palpable mass. The nipple is mildly retracted which is been a chronic issue for the patient,  no nipple discharge or bleeding   Abdomen:     Soft, non-tender, bowel sounds active all four quadrants,    no masses, no organomegaly        Extremities:   Extremities normal, atraumatic, no cyanosis or edema  Pulses:   2+ and symmetric all extremities  Skin:   Skin color, texture, turgor normal, no rashes or lesions  Lymph nodes:   Cervical, supraclavicular, and axillary nodes normal  Neurologic:    normal strength, sensation and reflexes    throughout     ECOG = 0  0 - Asymptomatic (Fully active, able to carry on all predisease activities without restriction)  LABORATORY DATA:  Lab Results  Component Value  Date   WBC 4.3 08/05/2014   HGB 12.8 08/05/2014   HCT 38.2 08/05/2014   MCV 90.4 08/05/2014   PLT 177.0 08/05/2014   NEUTROABS 2.6 08/05/2014   Lab Results  Component Value Date   NA 140 04/16/2014   K 4.2 04/16/2014   CL 104 04/16/2014   CO2 29 04/16/2014   GLUCOSE 134* 04/16/2014   CREATININE 0.9 04/16/2014   CALCIUM 9.6 04/16/2014      RADIOGRAPHY: Mr Breast Bilateral W Wo Contrast  09/02/2014   CLINICAL DATA:  Patient is a 73 year old female with recent diagnosis of invasive mammary carcinoma of the left breast diagnosed on image guided biopsy performed 08/27/2014.  LABS:  Creatinine 0.9, BUN 17, GFR 61  EXAM: BILATERAL BREAST MRI WITH AND WITHOUT CONTRAST  TECHNIQUE: Multiplanar, multisequence MR images of both breasts were obtained prior to and following the intravenous administration of 43m of MultiHance.  THREE-DIMENSIONAL MR IMAGE RENDERING ON INDEPENDENT WORKSTATION:  Three-dimensional MR images were rendered by post-processing of the original MR data on an independent workstation. The three-dimensional MR images were interpreted, and findings are reported in the following complete MRI report for this study. Three dimensional images were evaluated at the independent DynaCad workstation  COMPARISON:  Contrast-enhanced breast MR dated 12/14/2005  FINDINGS: Breast composition: c:  Heterogeneous fibroglandular tissue  Background parenchymal enhancement: Examination demonstrates moderate to marked background parenchymal enhancement with innumerable foci of non mass enhancement bilaterally with benign MR features.  Right breast: Numerous foci of non mass enhancement with benign MR features. No new mass, morphologic abnormality, or abnormal contrast enhancement to suggest contralateral malignancy. There has been no significant interval change from comparison contrast-enhanced breast MR.  Left breast: A focus of signal dropout is identified in the upper outer left breast denoting site of recent biopsy  proven malignancy. This is associated with a residual enhancing mass measuring approximately 1 x 1 x 0.8 cm. In addition, there is faint non mass enhancement noted immediately adjacent to the biopsy site most consistent with post biopsy sequelae. A 6 mm diameter oval enhancing mass with intrinsic high T2 signal and the medial right breast is unchanged from comparison MRI performed in 2007 with stable benign features. No additional enhancing mass to suggest multifocal or multicentric disease.  Lymph nodes: The axillary and internal mammary lymph node chains are symmetric and unremarkable.  Ancillary findings:  None.  IMPRESSION: 1. Biopsy-proven malignancy in the upper outer left breast measures approximately 1 cm in maximal diameter. 2. No MR findings to suggest multifocal or contralateral malignancy. 3. No evidence of adenopathy.  RECOMMENDATION: Continued surgical management.  BI-RADS CATEGORY  6: Known biopsy-proven malignancy.   Electronically Signed   By: AAndres Shad  On: 09/02/2014 10:39   Mm Digital Diagnostic Unilat L  08/27/2014   CLINICAL DATA:  The patient returns after screening study for evaluation of possible left breast mass. The patient's sister was diagnosed with breast cancer at age 73  EXAM: DIGITAL DIAGNOSTIC  LEFT MAMMOGRAM  ULTRASOUND LEFT BREAST  COMPARISON:  05/23/2012 and earlier  ACR Breast Density Category c: The breast tissue is heterogeneously dense, which may obscure small masses.  FINDINGS: Spot compression views confirm presence of an oval spiculated mass in the upper-outer quadrant of the left breast.  On physical exam, I palpate no abnormality in the upper-outer quadrant of the left breast.  Ultrasound is performed, showing a hypoechoic taller than wide lesion in the 1 o'clock location of the left breast 5 cm from the nipple. This measures 0.5 x 0.5 x 0.4 cm. No associated internal vascularity identified. Evaluation of the left axilla is negative for adenopathy.  IMPRESSION:  Persistent abnormality is a solid suspicious mass by ultrasound.  RECOMMENDATION: Tissue diagnosis is recommended. The patient requests ultrasound-guided core biopsy on the same day. This is performed and dictated separately.  I have discussed the findings and recommendations with the patient. Results were also provided in writing at the conclusion of the visit. If applicable, a reminder letter will be sent to the patient regarding the next appointment.  BI-RADS CATEGORY  4: Suspicious.   Electronically Signed   By: BShon HaleM.D.   On: 08/27/2014 14:58  Mm Digital Diagnostic Unilat L  08/27/2014   CLINICAL DATA:  Status post ultrasound-guided core biopsy of the left breast.  EXAM: DIAGNOSTIC LEFT MAMMOGRAM POST ULTRASOUND BIOPSY  COMPARISON:  Previous exams  FINDINGS: Mammographic images were obtained following ultrasound guided biopsy of mass in the 1 o'clock location of the left breast. A ribbon shaped clip is identified in the upper-outer quadrant of the left breast following biopsy.  IMPRESSION: Tissue marker clip is in expected location following biopsy.  Final Assessment: Post Procedure Mammograms for Marker Placement   Electronically Signed   By: Shon Hale M.D.   On: 08/27/2014 14:56   Mm Digital Screening Bilateral  08/20/2014   CLINICAL DATA:  Screening.  EXAM: DIGITAL SCREENING BILATERAL MAMMOGRAM WITH CAD  COMPARISON:  Previous exam(s)  ACR Breast Density Category c: The breast tissue is heterogeneously dense, which may obscure small masses.  FINDINGS: In the left breast, a possible mass warrants further evaluation with spot compression views and possibly ultrasound. In the right breast, no findings suspicious for malignancy.  Images were processed with CAD.  IMPRESSION: Further evaluation is suggested for possible mass in the left breast.  RECOMMENDATION: Diagnostic mammogram and possibly ultrasound of the left breast. (Code:[id number])  The patient will be contacted regarding the findings, and  additional imaging will be scheduled.  BI-RADS CATEGORY  0: Incomplete. Need additional imaging evaluation and/or prior mammograms for comparison.   Electronically Signed   By: Shon Hale M.D.   On: 08/20/2014 15:14   US Breast Ltd Uni Left Inc Axilla  08/27/2014   CLINICAL DATA:  The patient returns after screening study for evaluation of possible left breast mass. The patient's sister was diagnosed with breast cancer at age 85.  EXAM: DIGITAL DIAGNOSTIC  LEFT MAMMOGRAM  ULTRASOUND LEFT BREAST  COMPARISON:  05/23/2012 and earlier  ACR Breast Density Category c: The breast tissue is heterogeneously dense, which may obscure small masses.  FINDINGS: Spot compression views confirm presence of an oval spiculated mass in the upper-outer quadrant of the left breast.  On physical exam, I palpate no abnormality in the upper-outer quadrant of the left breast.  Ultrasound is performed, showing a hypoechoic taller than wide lesion in the 1 o'clock location of the left breast 5 cm from the nipple. This measures 0.5 x 0.5 x 0.4 cm. No associated internal vascularity identified. Evaluation of the left axilla is negative for adenopathy.  IMPRESSION: Persistent abnormality is a solid suspicious mass by ultrasound.  RECOMMENDATION: Tissue diagnosis is recommended. The patient requests ultrasound-guided core biopsy on the same day. This is performed and dictated separately.  I have discussed the findings and recommendations with the patient. Results were also provided in writing at the conclusion of the visit. If applicable, a reminder letter will be sent to the patient regarding the next appointment.  BI-RADS CATEGORY  4: Suspicious.   Electronically Signed   By: Shon Hale M.D.   On: 08/27/2014 14:58   [name] Lt Breast Bx W Loc Dev 1st Lesion Img Bx Spec US Guide  08/30/2014   ADDENDUM REPORT: 08/30/2014 12:15  ADDENDUM: Pathology revealed grade I to II invasive mammary carcinoma, favoring invasive ductal carcinoma in the left  breast. This was found to be concordant by Dr. Everlean Alstrom. Pathology was discussed with the patient by telephone. She reported doing well after the biopsy. Post biopsy instructions were reviewed and her questions were answered. Surgical consultation has been scheduled with Dr. [name] with [facility]  on September 03, 2014. A bilateral breast MRI has been scheduled for September 02, 2014. The patient is encouraged to come to [facility] for educational materials. My number is provided for future questions and concerns.  Pathology results reported by Susa Raring RN, BSN on August 30, 2014.   Electronically Signed   By: Shon Hale M.D.   On: 08/30/2014 12:15   08/30/2014   CLINICAL DATA:  Mass in the 1 o'clock location of the left breast.  EXAM: ULTRASOUND GUIDED LEFT BREAST CORE NEEDLE BIOPSY  COMPARISON:  Previous exams.  PROCEDURE: I met with the patient and we discussed the procedure of ultrasound-guided biopsy, including benefits and alternatives. We discussed the high likelihood of a successful procedure. We discussed the risks of the procedure including infection, bleeding, tissue injury, clip migration, and inadequate sampling. Informed written consent was given. The usual time-out protocol was performed immediately prior to the procedure.  Using sterile technique and 2% Lidocaine as local anesthetic, under direct ultrasound visualization, a 12 gauge vacuum-assisteddevice was used to perform biopsy of mass in the 1 o'clock location of the left breast using a lateral approach. At the conclusion of the procedure, a ribbon shaped tissue marker clip was deployed into the biopsy cavity. Follow-up 2-view mammogram was performed and dictated separately.  IMPRESSION: Ultrasound-guided biopsy of left breast mass. No apparent complications.  Electronically Signed: By: Shon Hale M.D. 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 I discussed options with the patient including partial mastectomy and mastectomy. Patient wishes to pursue breast conservation therapy. I discussed general recommendations for radiation therapy. We also discussed hypofractionated radiation therapy as a possibility. In addition if the patient has a small tumor completely resected with no lymphatic spread and she proceeds with hormonal therapy, the patient may be able to avoid radiation therapy.  PLAN: The patient will be seen in the postoperative setting for further evaluation along with medical oncology evaluation.     ------------------------------------------------  Blair Promise, PhD, MD

## 2014-09-09 ENCOUNTER — Encounter (HOSPITAL_BASED_OUTPATIENT_CLINIC_OR_DEPARTMENT_OTHER): Payer: Self-pay | Admitting: *Deleted

## 2014-09-09 NOTE — Progress Notes (Signed)
09/09/14 Ruma  Have you ever been diagnosed with sleep apnea through a sleep study? No  Do you snore loudly (loud enough to be heard through closed doors)?  0  Do you often feel tired, fatigued, or sleepy during the daytime? 0  Has anyone observed you stop breathing during your sleep? 0  Do you have, or are you being treated for high blood pressure? 1  BMI more than 35 kg/m2? 1  Age over 72 years old? 1  Neck circumference greater than 40 cm/16 inches? 1  Gender: 0  Obstructive Sleep Apnea Score 4  Score 4 or greater  Results sent to PCP

## 2014-09-09 NOTE — Progress Notes (Signed)
Pt will come in for bmet when she does seeds-ekg and cxr done 2/15 Denies any cardiac or resp problems

## 2014-09-14 ENCOUNTER — Inpatient Hospital Stay: Admission: RE | Admit: 2014-09-14 | Payer: Medicare Other | Source: Ambulatory Visit

## 2014-09-15 ENCOUNTER — Ambulatory Visit
Admission: RE | Admit: 2014-09-15 | Discharge: 2014-09-15 | Disposition: A | Discharge status: Home / Self Care | Payer: Medicare Other | Source: Ambulatory Visit | Attending: General Surgery | Admitting: General Surgery

## 2014-09-15 ENCOUNTER — Encounter (HOSPITAL_BASED_OUTPATIENT_CLINIC_OR_DEPARTMENT_OTHER)
Admission: RE | Admit: 2014-09-15 | Discharge: 2014-09-15 | Disposition: A | Discharge status: Home / Self Care | Payer: Medicare Other | Source: Ambulatory Visit | Attending: General Surgery | Admitting: General Surgery

## 2014-09-15 DIAGNOSIS — Z803 Family history of malignant neoplasm of breast: Secondary | ICD-10-CM | POA: Diagnosis not present

## 2014-09-15 DIAGNOSIS — C50412 Malignant neoplasm of upper-outer quadrant of left female breast: Secondary | ICD-10-CM | POA: Diagnosis not present

## 2014-09-15 DIAGNOSIS — Z888 Allergy status to other drugs, medicaments and biological substances status: Secondary | ICD-10-CM | POA: Diagnosis not present

## 2014-09-15 DIAGNOSIS — Z170 Estrogen receptor positive status [ER+]: Secondary | ICD-10-CM | POA: Diagnosis not present

## 2014-09-15 DIAGNOSIS — Z79899 Other long term (current) drug therapy: Secondary | ICD-10-CM | POA: Diagnosis not present

## 2014-09-15 DIAGNOSIS — C50912 Malignant neoplasm of unspecified site of left female breast: Secondary | ICD-10-CM | POA: Diagnosis present

## 2014-09-15 DIAGNOSIS — Z885 Allergy status to narcotic agent status: Secondary | ICD-10-CM | POA: Diagnosis not present

## 2014-09-15 HISTORY — PX: BREAST LUMPECTOMY: SHX2

## 2014-09-15 LAB — BASIC METABOLIC PANEL
Anion gap: 15 (ref 5–15)
BUN: 18 mg/dL (ref 6–23)
CHLORIDE: 104 meq/L (ref 96–112)
CO2: 21 meq/L (ref 19–32)
CREATININE: 0.8 mg/dL (ref 0.50–1.10)
Calcium: 9.8 mg/dL (ref 8.4–10.5)
GFR calc Af Amer: 83 mL/min — ABNORMAL LOW (ref >90)
GFR calc non Af Amer: 71 mL/min — ABNORMAL LOW (ref >90)
Glucose, Bld: 91 mg/dL (ref 70–99)
Potassium: 4.2 mEq/L (ref 3.7–5.3)
Sodium: 140 mEq/L (ref 137–147)

## 2014-09-16 ENCOUNTER — Ambulatory Visit (HOSPITAL_BASED_OUTPATIENT_CLINIC_OR_DEPARTMENT_OTHER)
Admission: RE | Admit: 2014-09-16 | Discharge: 2014-09-16 | Disposition: A | Discharge status: Home / Self Care | Payer: Medicare Other | Source: Ambulatory Visit | Attending: General Surgery | Admitting: General Surgery

## 2014-09-16 ENCOUNTER — Ambulatory Visit (HOSPITAL_BASED_OUTPATIENT_CLINIC_OR_DEPARTMENT_OTHER): Payer: Medicare Other | Admitting: Anesthesiology

## 2014-09-16 ENCOUNTER — Encounter (HOSPITAL_BASED_OUTPATIENT_CLINIC_OR_DEPARTMENT_OTHER)
Admission: RE | Disposition: A | Discharge status: Home / Self Care | Payer: Self-pay | Source: Ambulatory Visit | Attending: General Surgery

## 2014-09-16 ENCOUNTER — Encounter (HOSPITAL_BASED_OUTPATIENT_CLINIC_OR_DEPARTMENT_OTHER): Payer: Self-pay | Admitting: *Deleted

## 2014-09-16 ENCOUNTER — Encounter (HOSPITAL_COMMUNITY)
Admission: RE | Admit: 2014-09-16 | Discharge: 2014-09-16 | Disposition: A | Discharge status: Home / Self Care | Payer: Medicare Other | Source: Ambulatory Visit | Attending: General Surgery | Admitting: General Surgery

## 2014-09-16 ENCOUNTER — Encounter (HOSPITAL_BASED_OUTPATIENT_CLINIC_OR_DEPARTMENT_OTHER): Payer: Medicare Other | Admitting: Anesthesiology

## 2014-09-16 ENCOUNTER — Ambulatory Visit
Admission: RE | Admit: 2014-09-16 | Discharge: 2014-09-16 | Disposition: A | Discharge status: Home / Self Care | Payer: Medicare Other | Source: Ambulatory Visit | Attending: General Surgery | Admitting: General Surgery

## 2014-09-16 DIAGNOSIS — Z885 Allergy status to narcotic agent status: Secondary | ICD-10-CM | POA: Diagnosis not present

## 2014-09-16 DIAGNOSIS — C50412 Malignant neoplasm of upper-outer quadrant of left female breast: Secondary | ICD-10-CM

## 2014-09-16 DIAGNOSIS — Z170 Estrogen receptor positive status [ER+]: Secondary | ICD-10-CM | POA: Diagnosis not present

## 2014-09-16 DIAGNOSIS — Z803 Family history of malignant neoplasm of breast: Secondary | ICD-10-CM | POA: Insufficient documentation

## 2014-09-16 DIAGNOSIS — Z79899 Other long term (current) drug therapy: Secondary | ICD-10-CM | POA: Insufficient documentation

## 2014-09-16 DIAGNOSIS — Z888 Allergy status to other drugs, medicaments and biological substances status: Secondary | ICD-10-CM | POA: Insufficient documentation

## 2014-09-16 HISTORY — DX: Malignant (primary) neoplasm, unspecified: C80.1

## 2014-09-16 HISTORY — DX: Family history of other specified conditions: Z84.89

## 2014-09-16 HISTORY — PX: RADIOACTIVE SEED GUIDED PARTIAL MASTECTOMY WITH AXILLARY SENTINEL LYMPH NODE BIOPSY: SHX6520

## 2014-09-16 LAB — POCT HEMOGLOBIN-HEMACUE: Hemoglobin: 10.9 g/dL — ABNORMAL LOW (ref 12.0–15.0)

## 2014-09-16 SURGERY — RADIOACTIVE SEED GUIDED PARTIAL MASTECTOMY WITH AXILLARY SENTINEL LYMPH NODE BIOPSY
Anesthesia: Regional | Site: Breast | Laterality: Left

## 2014-09-16 MED ORDER — CEFAZOLIN SODIUM-DEXTROSE 2-3 GM-% IV SOLR
INTRAVENOUS | Status: AC
  Filled 2014-09-16: qty 50

## 2014-09-16 MED ORDER — FENTANYL CITRATE 0.05 MG/ML IJ SOLN
INTRAMUSCULAR | Status: AC
  Filled 2014-09-16: qty 6

## 2014-09-16 MED ORDER — FENTANYL CITRATE 0.05 MG/ML IJ SOLN: 50.0000 ug | INTRAMUSCULAR | Status: DC | PRN

## 2014-09-16 MED ORDER — ONDANSETRON HCL 4 MG/2ML IJ SOLN
INTRAMUSCULAR | Status: DC | PRN
  Administered 2014-09-16: 4 mg via INTRAVENOUS

## 2014-09-16 MED ORDER — SODIUM CHLORIDE 0.9 % IV SOLN: 250.0000 mL | INTRAVENOUS | Status: DC | PRN

## 2014-09-16 MED ORDER — PHENYLEPHRINE HCL 10 MG/ML IJ SOLN
INTRAMUSCULAR | Status: DC | PRN
  Administered 2014-09-16: 40 ug via INTRAVENOUS

## 2014-09-16 MED ORDER — MIDAZOLAM HCL 2 MG/2ML IJ SOLN
INTRAMUSCULAR | Status: AC
  Filled 2014-09-16: qty 2

## 2014-09-16 MED ORDER — BUPIVACAINE HCL (PF) 0.25 % IJ SOLN
INTRAMUSCULAR | Status: AC
  Filled 2014-09-16: qty 30

## 2014-09-16 MED ORDER — SCOPOLAMINE 1 MG/3DAYS TD PT72: 1.0000 | MEDICATED_PATCH | TRANSDERMAL | Status: DC

## 2014-09-16 MED ORDER — HYDROMORPHONE HCL 1 MG/ML IJ SOLN: 0.2500 mg | INTRAMUSCULAR | Status: DC | PRN

## 2014-09-16 MED ORDER — LACTATED RINGERS IV SOLN
INTRAVENOUS | Status: DC
Start: 2014-09-16 — End: 2014-09-16
  Administered 2014-09-16 (×2): via INTRAVENOUS

## 2014-09-16 MED ORDER — HYDROCODONE-ACETAMINOPHEN 5-325 MG PO TABS: 1.0000 | ORAL_TABLET | ORAL | Status: DC | PRN

## 2014-09-16 MED ORDER — PROPOFOL 10 MG/ML IV BOLUS
INTRAVENOUS | Status: DC | PRN
  Administered 2014-09-16: 50 mg via INTRAVENOUS
  Administered 2014-09-16: 150 mg via INTRAVENOUS

## 2014-09-16 MED ORDER — LIDOCAINE-EPINEPHRINE (PF) 1 %-1:200000 IJ SOLN
INTRAMUSCULAR | Status: AC
  Filled 2014-09-16: qty 10

## 2014-09-16 MED ORDER — ACETAMINOPHEN 650 MG RE SUPP: 650.0000 mg | RECTAL | Status: DC | PRN

## 2014-09-16 MED ORDER — FENTANYL CITRATE 0.05 MG/ML IJ SOLN
INTRAMUSCULAR | Status: DC | PRN
  Administered 2014-09-16 (×2): 50 ug via INTRAVENOUS
  Administered 2014-09-16: 25 ug via INTRAVENOUS

## 2014-09-16 MED ORDER — LIDOCAINE HCL (CARDIAC) 20 MG/ML IV SOLN
INTRAVENOUS | Status: DC | PRN
  Administered 2014-09-16: 50 mg via INTRAVENOUS

## 2014-09-16 MED ORDER — BUPIVACAINE-EPINEPHRINE (PF) 0.25% -1:200000 IJ SOLN
INTRAMUSCULAR | Status: AC
  Filled 2014-09-16: qty 30

## 2014-09-16 MED ORDER — METHYLENE BLUE 1 % INJ SOLN
INTRAMUSCULAR | Status: AC
  Filled 2014-09-16: qty 10

## 2014-09-16 MED ORDER — BUPIVACAINE-EPINEPHRINE (PF) 0.5% -1:200000 IJ SOLN
INTRAMUSCULAR | Status: DC | PRN
  Administered 2014-09-16: 25 mL via PERINEURAL

## 2014-09-16 MED ORDER — FENTANYL CITRATE 0.05 MG/ML IJ SOLN
INTRAMUSCULAR | Status: AC
  Filled 2014-09-16: qty 2

## 2014-09-16 MED ORDER — ACETAMINOPHEN 325 MG PO TABS: 650.0000 mg | ORAL_TABLET | ORAL | Status: DC | PRN

## 2014-09-16 MED ORDER — SODIUM CHLORIDE 0.9 % IJ SOLN
INTRAMUSCULAR | Status: AC
  Filled 2014-09-16: qty 10

## 2014-09-16 MED ORDER — OXYCODONE HCL 5 MG PO TABS
5.0000 mg | ORAL_TABLET | ORAL | Status: DC | PRN
Start: 2014-09-16 — End: 2014-09-16

## 2014-09-16 MED ORDER — CEFAZOLIN SODIUM-DEXTROSE 2-3 GM-% IV SOLR: 2.0000 g | INTRAVENOUS | Status: DC

## 2014-09-16 MED ORDER — TECHNETIUM TC 99M SULFUR COLLOID FILTERED
1.0000 | Freq: Once | INTRAVENOUS | Status: AC | PRN
Start: 2014-09-16 — End: 2014-09-16
  Administered 2014-09-16: 1 via INTRADERMAL

## 2014-09-16 MED ORDER — CEFAZOLIN SODIUM-DEXTROSE 2-3 GM-% IV SOLR
INTRAVENOUS | Status: DC | PRN
  Administered 2014-09-16: 2 g via INTRAVENOUS

## 2014-09-16 MED ORDER — SCOPOLAMINE 1 MG/3DAYS TD PT72
MEDICATED_PATCH | TRANSDERMAL | Status: AC
  Filled 2014-09-16: qty 1

## 2014-09-16 MED ORDER — MIDAZOLAM HCL 2 MG/2ML IJ SOLN: 1.0000 mg | INTRAMUSCULAR | Status: DC | PRN

## 2014-09-16 MED ORDER — SODIUM CHLORIDE 0.9 % IJ SOLN: 3.0000 mL | INTRAMUSCULAR | Status: DC | PRN

## 2014-09-16 MED ORDER — DEXAMETHASONE SODIUM PHOSPHATE 4 MG/ML IJ SOLN
INTRAMUSCULAR | Status: DC | PRN
  Administered 2014-09-16: 10 mg via INTRAVENOUS

## 2014-09-16 MED ORDER — SODIUM CHLORIDE 0.9 % IJ SOLN: 3.0000 mL | Freq: Two times a day (BID) | INTRAMUSCULAR | Status: DC

## 2014-09-16 SURGICAL SUPPLY — 67 items
ADH SKN CLS APL DERMABOND .7 (GAUZE/BANDAGES/DRESSINGS) ×1
APPLIER CLIP 9.375 MED OPEN (MISCELLANEOUS)
APR CLP MED 9.3 20 MLT OPN (MISCELLANEOUS)
BINDER BREAST LRG (GAUZE/BANDAGES/DRESSINGS) IMPLANT
BINDER BREAST MEDIUM (GAUZE/BANDAGES/DRESSINGS) IMPLANT
BINDER BREAST XLRG (GAUZE/BANDAGES/DRESSINGS) ×2 IMPLANT
BINDER BREAST XXLRG (GAUZE/BANDAGES/DRESSINGS) IMPLANT
BLADE HEX COATED 2.75 (ELECTRODE) ×3 IMPLANT
BLADE SURG 10 STRL SS (BLADE) ×3 IMPLANT
BLADE SURG 15 STRL LF DISP TIS (BLADE) ×1 IMPLANT
BLADE SURG 15 STRL SS (BLADE) ×3
BNDG COHESIVE 4X5 TAN STRL (GAUZE/BANDAGES/DRESSINGS) ×3 IMPLANT
CANISTER SUC SOCK COL 7IN (MISCELLANEOUS) IMPLANT
CANISTER SUCT 1200ML W/VALVE (MISCELLANEOUS) ×3 IMPLANT
CHLORAPREP W/TINT 26ML (MISCELLANEOUS) ×3 IMPLANT
CLIP APPLIE 9.375 MED OPEN (MISCELLANEOUS) IMPLANT
CLIP TI LARGE 6 (CLIP) ×3 IMPLANT
CLIP TI MEDIUM 6 (CLIP) ×5 IMPLANT
CLIP TI WIDE RED SMALL 6 (CLIP) IMPLANT
CLOSURE WOUND 1/2 X4 (GAUZE/BANDAGES/DRESSINGS) ×1
COVER MAYO STAND STRL (DRAPES) ×3 IMPLANT
COVER PROBE W GEL 5X96 (DRAPES) ×3 IMPLANT
DECANTER SPIKE VIAL GLASS SM (MISCELLANEOUS) IMPLANT
DERMABOND ADVANCED (GAUZE/BANDAGES/DRESSINGS) ×2
DERMABOND ADVANCED .7 DNX12 (GAUZE/BANDAGES/DRESSINGS) ×1 IMPLANT
DEVICE DUBIN W/COMP PLATE 8390 (MISCELLANEOUS) ×3 IMPLANT
DRAPE UNIVERSAL PACK (DRAPES) ×3 IMPLANT
DRAPE UTILITY XL STRL (DRAPES) ×3 IMPLANT
DRSG PAD ABDOMINAL 8X10 ST (GAUZE/BANDAGES/DRESSINGS) IMPLANT
ELECT REM PT RETURN 9FT ADLT (ELECTROSURGICAL) ×3
ELECTRODE REM PT RTRN 9FT ADLT (ELECTROSURGICAL) ×1 IMPLANT
GLOVE BIO SURGEON STRL SZ 6 (GLOVE) ×3 IMPLANT
GLOVE BIO SURGEON STRL SZ7 (GLOVE) ×4 IMPLANT
GLOVE BIOGEL PI IND STRL 6.5 (GLOVE) ×1 IMPLANT
GLOVE BIOGEL PI IND STRL 7.5 (GLOVE) IMPLANT
GLOVE BIOGEL PI INDICATOR 6.5 (GLOVE) ×2
GLOVE BIOGEL PI INDICATOR 7.5 (GLOVE) ×4
GOWN STRL REUS W/ TWL LRG LVL3 (GOWN DISPOSABLE) ×1 IMPLANT
GOWN STRL REUS W/TWL 2XL LVL3 (GOWN DISPOSABLE) ×3 IMPLANT
GOWN STRL REUS W/TWL LRG LVL3 (GOWN DISPOSABLE) ×3
KIT MARKER MARGIN INK (KITS) ×3 IMPLANT
NDL HYPO 25X1 1.5 SAFETY (NEEDLE) ×1 IMPLANT
NEEDLE HYPO 25X1 1.5 SAFETY (NEEDLE) ×3 IMPLANT
NS IRRIG 1000ML POUR BTL (IV SOLUTION) ×2 IMPLANT
PACK BASIN DAY SURGERY FS (CUSTOM PROCEDURE TRAY) ×3 IMPLANT
PENCIL BUTTON HOLSTER BLD 10FT (ELECTRODE) ×3 IMPLANT
SHEET MEDIUM DRAPE 40X70 STRL (DRAPES) IMPLANT
SLEEVE SCD COMPRESS KNEE MED (MISCELLANEOUS) ×3 IMPLANT
SPONGE GAUZE 4X4 12PLY STER LF (GAUZE/BANDAGES/DRESSINGS) ×3 IMPLANT
SPONGE LAP 18X18 X RAY DECT (DISPOSABLE) ×3 IMPLANT
STOCKINETTE IMPERVIOUS LG (DRAPES) ×3 IMPLANT
STRIP CLOSURE SKIN 1/2X4 (GAUZE/BANDAGES/DRESSINGS) ×2 IMPLANT
SUT ETHILON 2 0 FS 18 (SUTURE) IMPLANT
SUT MNCRL AB 4-0 PS2 18 (SUTURE) ×3 IMPLANT
SUT MON AB 5-0 PS2 18 (SUTURE) IMPLANT
SUT SILK 2 0 SH (SUTURE) IMPLANT
SUT VIC AB 2-0 SH 27 (SUTURE) ×3
SUT VIC AB 2-0 SH 27XBRD (SUTURE) ×1 IMPLANT
SUT VIC AB 3-0 SH 27 (SUTURE) ×3
SUT VIC AB 3-0 SH 27X BRD (SUTURE) ×1 IMPLANT
SUT VIC AB 5-0 PS2 18 (SUTURE) IMPLANT
SYR CONTROL 10ML LL (SYRINGE) ×3 IMPLANT
TOWEL OR 17X24 6PK STRL BLUE (TOWEL DISPOSABLE) ×3 IMPLANT
TOWEL OR NON WOVEN STRL DISP B (DISPOSABLE) ×3 IMPLANT
TUBE CONNECTING 20'X1/4 (TUBING) ×1
TUBE CONNECTING 20X1/4 (TUBING) ×2 IMPLANT
YANKAUER SUCT BULB TIP NO VENT (SUCTIONS) ×2 IMPLANT

## 2014-09-16 NOTE — Anesthesia Preprocedure Evaluation (Addendum)
Anesthesia Evaluation  Patient identified by MRN, date of birth, ID band Patient awake    Reviewed: Allergy & Precautions, H&P , NPO status , Patient's Chart, lab work & pertinent test results  History of Anesthesia Complications (+) PROLONGED EMERGENCE and history of anesthetic complications  Airway Mallampati: II TM Distance: >3 FB Neck ROM: Full    Dental no notable dental hx. (+) Caps, Dental Advisory Given   Pulmonary neg pulmonary ROS,    Pulmonary exam normal       Cardiovascular hypertension, Pt. on medications     Neuro/Psych negative neurological ROS  negative psych ROS   GI/Hepatic negative GI ROS, Neg liver ROS,   Endo/Other  Morbid obesity  Renal/GU negative Renal ROS  negative genitourinary   Musculoskeletal   Abdominal   Peds  Hematology negative hematology ROS (+)   Anesthesia Other Findings   Reproductive/Obstetrics negative OB ROS                          Anesthesia Physical Anesthesia Plan  ASA: III  Anesthesia Plan: General and Regional   Post-op Pain Management:    Induction: Intravenous  Airway Management Planned: LMA  Additional Equipment:   Intra-op Plan:   Post-operative Plan: Extubation in OR  Informed Consent: I have reviewed the patients History and Physical, chart, labs and discussed the procedure including the risks, benefits and alternatives for the proposed anesthesia with the patient or authorized representative who has indicated his/her understanding and acceptance.   Dental advisory given  Plan Discussed with: CRNA  Anesthesia Plan Comments:         Anesthesia Quick Evaluation

## 2014-09-16 NOTE — Anesthesia Procedure Notes (Addendum)
Anesthesia Regional Block:  Pectoralis block  Pre-Anesthetic Checklist: ,, timeout performed, Correct Patient, Correct Site, Correct Laterality, Correct Procedure, Correct Position, site marked, Risks and benefits discussed,  Surgical consent,  Pre-op evaluation,  At surgeon's request and post-op pain management  Laterality: Left  Prep: chloraprep       Needles:  Injection technique: Single-shot  Needle Type: Echogenic Stimulator Needle     Needle Length: 10cm 10 cm Needle Gauge: 21 and 21 G    Additional Needles:  Procedures: ultrasound guided (picture in chart) Pectoralis block Narrative:  Start time: 09/16/2014 12:16 PM End time: 09/16/2014 12:26 PM Injection made incrementally with aspirations every 5 mL.  Performed by: Personally    Procedure Name: LMA Insertion Date/Time: 09/16/2014 1:29 PM Performed by: Marrianne Mood Pre-anesthesia Checklist: Patient identified, Emergency Drugs available, Suction available, Patient being monitored and Timeout performed Patient Re-evaluated:Patient Re-evaluated prior to inductionOxygen Delivery Method: Circle System Utilized Preoxygenation: Pre-oxygenation with 100% oxygen Intubation Type: IV induction Ventilation: Mask ventilation without difficulty LMA: LMA inserted LMA Size: 4.0 Number of attempts: 1 Airway Equipment and Method: bite block Placement Confirmation: positive ETCO2 Tube secured with: Tape Dental Injury: Teeth and Oropharynx as per pre-operative assessment

## 2014-09-16 NOTE — Interval H&P Note (Signed)
History and Physical Interval Note:  09/16/2014 1:12 PM  Lori Joseph  has presented today for surgery, with the diagnosis of left breast cancer  The various methods of treatment have been discussed with the patient and family. After consideration of risks, benefits and other options for treatment, the patient has consented to  Procedure(s): RADIOACTIVE SEED GUIDED PARTIAL MASTECTOMY WITH AXILLARY SENTINEL LYMPH NODE BIOPSY (Left) as a surgical intervention .  The patient's history has been reviewed, patient examined, no change in status, stable for surgery.  I have reviewed the patient's chart and labs.  Questions were answered to the patient's satisfaction.     Daveon Arpino

## 2014-09-16 NOTE — Transfer of Care (Signed)
Immediate Anesthesia Transfer of Care Note  Patient: Lori Joseph  Procedure(s) Performed: Procedure(s): RADIOACTIVE SEED GUIDED PARTIAL MASTECTOMY WITH AXILLARY SENTINEL LYMPH NODE BIOPSY (Left)  Patient Location: PACU  Anesthesia Type:GA combined with regional for post-op pain  Level of Consciousness: awake, alert  and patient cooperative  Airway & Oxygen Therapy: Patient Spontanous Breathing and Patient connected to face mask oxygen  Post-op Assessment: Report given to PACU RN and Post -op Vital signs reviewed and stable  Post vital signs: Reviewed and stable  Complications: No apparent anesthesia complications

## 2014-09-16 NOTE — H&P (View-Only) (Signed)
Lori Joseph 09/03/2014 10:33 AM Location: [facility] Patient #: [id number] DOB: Dec 22, 1940 Married / Language: Lori Joseph / Race: White Female History of Present Illness Lori Klein MD; 09/03/2014 11:13 AM) Patient words: eval left breast ca.  The patient is a 73 year old female who presents with breast cancer. 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 Tumor markers include estrogen receptor positive, progesterone receptor positive and HER-2/neu negative. The patient was referred by a specialty consultant (Dr. Shon Hale. ). Initial presentation was 2 week(s) ago for an abnormal mammogram. Current diagnosis was determined by mammography, breast MRI, left breast ultrasound and core needle biopsy. This problem has not been previously treated. Symptoms do not include breast pain, nipple discharge, skin changes in the involved breast, edema of the arm or shortness of breath. Risk factors include menopause after 60 years and breast cancer in a first degree relative (sister), while risk factors do not include menarche before 56 years. Other Problems Lori Joseph, CMA; 09/03/2014 10:35 AM) Breast Cancer High blood pressure Hypercholesterolemia  Past Surgical History Lori Joseph, CMA; 09/03/2014 10:35 AM) Breast Biopsy Left. Colon Polyp Removal - Colonoscopy Gallbladder Surgery - Laparoscopic  Diagnostic Studies History Lori Joseph, CMA; 09/03/2014 10:35 AM) Colonoscopy 1-5 years ago Mammogram 1-3 years ago Pap Smear 1-5 years ago  Allergies Lori Joseph, CMA; 09/03/2014 10:35 AM) Carvedilol *BETA BLOCKERS* Codeine Phosphate *ANALGESICS - OPIOID* Felodipine *CALCIUM CHANNEL BLOCKERS* Lisinopril *ANTIHYPERTENSIVES* Losartan Potassium *ANTIHYPERTENSIVES*  Medication History (Lori Joseph, CMA; 09/03/2014 10:34 AM) Benicar (40MG Tablet, Oral  daily) Active. Pravastatin Sodium (40MG Tablet, Oral daily) Active. Latanoprost (0.005% Solution, Ophthalmic daily) Active.  Social History ([name]; 09/03/2014 10:35 AM) Caffeine use Coffee. No alcohol use No drug use Tobacco use Never smoker.  Family History Lori Joseph, [name]; 09/03/2014 10:35 AM) Breast Cancer Sister. Cerebrovascular Accident Mother, Sister. Hypertension Mother, Sister. Melanoma Sister. Thyroid problems Sister.  Pregnancy / Birth History Lori Joseph, [name]; 09/03/2014 10:35 AM) Age at menarche 57 years. Age of menopause >35 Gravida 2 Maternal age 21-25 Para 2     Review of Systems (Lori Joseph; 09/03/2014 10:35 AM) General Present- Weight Gain. Not Present- Appetite Loss, Chills, Fatigue, Fever, Night Sweats and Weight Loss. Skin Not Present- Change in Wart/Mole, Dryness, Hives, Jaundice, New Lesions, Non-Healing Wounds, Rash and Ulcer. HEENT Present- Wears glasses/contact lenses. Not Present- Earache, Hearing Loss, Hoarseness, Nose Bleed, Oral Ulcers, Ringing in the Ears, Seasonal Allergies, Sinus Pain, Sore Throat, Visual Disturbances and Yellow Eyes. Respiratory Present- Snoring. Not Present- Bloody sputum, Chronic Cough, Difficulty Breathing and Wheezing. Breast Present- Breast Mass. Not Present- Breast Pain, Nipple Discharge and Skin Changes. Cardiovascular Present- Leg Cramps. Not Present- Chest Pain, Difficulty Breathing Lying Down, Palpitations, Rapid Heart Rate, Shortness of Breath and Swelling of Extremities.  Vitals (Lori Joseph CMA; 09/03/2014 10:37 AM) 09/03/2014 10:36 AM Weight: 230 lb Height: 63in Body Surface Area: 2.15 m Body Mass Index: 40.74 kg/m Temp.: 97.62F(Temporal)  Pulse: 76 (Regular)  BP: 124/74 (Sitting, Left Arm, Standard)     Physical Exam Lori Klein MD; 09/03/2014 11:10 AM)  General Mental Status-Alert. General Appearance-Consistent with stated age. Hydration-Well  hydrated. Voice-Normal.  Head and Neck Head-normocephalic, atraumatic with no lesions or palpable masses. Trachea-midline. Thyroid Gland Characteristics - normal size and consistency.  Eye Eyeball - Bilateral-Extraocular movements intact. Sclera/Conjunctiva - Bilateral-No scleral icterus.  Chest and Lung Exam Chest  and lung exam reveals -quiet, even and easy respiratory effort with no use of accessory muscles and on auscultation, normal breath sounds, no adventitious sounds and normal vocal resonance. Inspection Chest Wall - Normal. Back - normal.  Breast Note: breasts are symmetric bilaterally. there is nipple retraction on the cancer side (left), but this has always been present as long as she can remember. no palpable masses, skin dimpling, nipple discharge, lymphadenopathy. no significant bruising from biopsy site.   Cardiovascular Cardiovascular examination reveals -normal heart sounds, regular rate and rhythm with no murmurs and normal pedal pulses bilaterally.  Abdomen Inspection Inspection of the abdomen reveals - No Hernias. Palpation/Percussion Palpation and Percussion of the abdomen reveal - Soft, Non Tender, No Rebound tenderness, No Rigidity (guarding) and No hepatosplenomegaly. Auscultation Auscultation of the abdomen reveals - Bowel sounds normal.  Neurologic Neurologic evaluation reveals -alert and oriented x 3 with no impairment of recent or remote memory. Mental Status-Normal.  Neuropsychiatric Note: See seems to have a slight bit of memory issue, but husband is present. very subtle.   Musculoskeletal Global Assessment -Note:no gross deformities.  Normal Exam - Left-Upper Extremity Strength Normal and Lower Extremity Strength Normal. Normal Exam - Right-Upper Extremity Strength Normal and Lower Extremity Strength Normal.  Lymphatic Head & Neck  General Head & Neck Lymphatics: Bilateral - Description -  Normal. Axillary  General Axillary Region: Bilateral - Description - Normal. Tenderness - Non Tender. Femoral & Inguinal  Generalized Femoral & Inguinal Lymphatics: Bilateral - Description - No Generalized lymphadenopathy.    Assessment & Plan Lori Klein MD; 09/03/2014 11:13 AM)  PRIMARY CANCER OF UPPER OUTER QUADRANT OF LEFT FEMALE BREAST (174.4  C50.412) Impression: We will plan seed guided lumpectomy and sentinel lymph node biopsy on the left. I reviewed the surgery including the timeline for seed placement and sentinel node injection. I discussed risks of surgery including bleeding, infection, pain, numbness, need for additional procedures or surgery, possible lymphedema, damage to adjacent structures, possible heart or lung issues, and possibility of having more extensive cancer than we thought.  She will also need referral to rad onc and med onc.  Current Plans Schedule for Surgery Pt Education - CSS Breast Biopsy Instructions (FLB): discussed with patient and provided information. Referred to Oncology, for evaluation and follow up (Oncology). Referred to Radiation Oncology, for evaluation and follow up (Radiation Oncology).

## 2014-09-16 NOTE — Progress Notes (Signed)
Assisted Dr. Singer with left, ultrasound guided, pectoralis block. Side rails up, monitors on throughout procedure. See vital signs in flow sheet. Tolerated Procedure well. °

## 2014-09-16 NOTE — Discharge Instructions (Addendum)
[facility] °Office Phone Number [phone] ° °BREAST BIOPSY/ PARTIAL MASTECTOMY: POST OP INSTRUCTIONS ° °Always review your discharge instruction sheet given to you by the facility where your surgery was performed. ° °IF YOU HAVE DISABILITY OR FAMILY LEAVE FORMS, YOU MUST BRING THEM TO THE OFFICE FOR PROCESSING.  DO NOT GIVE THEM TO YOUR DOCTOR. ° °1. A prescription for pain medication may be given to you upon discharge.  Take your pain medication as prescribed, if needed.  If narcotic pain medicine is not needed, then you may take acetaminophen (Tylenol) or ibuprofen (Advil) as needed. °2. Take your usually prescribed medications unless otherwise directed °3. If you need a refill on your pain medication, please contact your pharmacy.  They will contact our office to request authorization.  Prescriptions will not be filled after 5pm or on week-ends. °4. You should eat very light the first 24 hours after surgery, such as soup, crackers, pudding, etc.  Resume your normal diet the day after surgery. °5. Most patients will experience some swelling and bruising in the breast.  Ice packs and a good support bra will help.  Swelling and bruising can take several days to resolve.  °6. It is common to experience some constipation if taking pain medication after surgery.  Increasing fluid intake and taking a stool softener will usually help or prevent this problem from occurring.  A mild laxative (Milk of Magnesia or Miralax) should be taken according to package directions if there are no bowel movements after 48 hours. °7. Unless discharge instructions indicate otherwise, you may remove your bandages 48 hours after surgery, and you may shower at that time.  You may have steri-strips (small skin tapes) in place directly over the incision.  These strips should be left on the skin for 7-10 days.   Any sutures or staples will be removed at the office during your follow-up visit. °8. ACTIVITIES:  You may resume  regular daily activities (gradually increasing) beginning the next day.  Wearing a good support bra or sports bra (or the breast binder) minimizes pain and swelling.  You may have sexual intercourse when it is comfortable. °a. You may drive when you no longer are taking prescription pain medication, you can comfortably wear a seatbelt, and you can safely maneuver your car and apply brakes. °b. RETURN TO WORK:  __________1 week_______________ °9. You should see your doctor in the office for a follow-up appointment approximately two weeks after your surgery.  Your doctor’s nurse will typically make your follow-up appointment when she calls you with your pathology report.  Expect your pathology report 2-3 business days after your surgery.  You may call to check if you do not hear from us after three days. ° ° °WHEN TO CALL YOUR DOCTOR: °1. Fever over 101.0 °2. Nausea and/or vomiting. °3. Extreme swelling or bruising. °4. Continued bleeding from incision. °5. Increased pain, redness, or drainage from the incision. ° °The clinic staff is available to answer your questions during regular business hours.  Please don’t hesitate to call and ask to speak to one of the nurses for clinical concerns.  If you have a medical emergency, go to the nearest emergency room or call 911.  A surgeon from [facility] is always on call at the hospital. ° °For further questions, please visit [email or web address]  ° ° °Post Anesthesia Home Care Instructions ° °Activity: °Get plenty of rest for the remainder of the day. A responsible adult should stay with you for 24   hours following the procedure.  °For the next 24 hours, DO NOT: °-Drive a car °-Operate machinery °-Drink alcoholic beverages °-Take any medication unless instructed by your physician °-Make any legal decisions or sign important papers. ° °Meals: °Start with liquid foods such as gelatin or soup. Progress to regular foods as tolerated. Avoid greasy, spicy, heavy  foods. If nausea and/or vomiting occur, drink only clear liquids until the nausea and/or vomiting subsides. Call your physician if vomiting continues. ° °Special Instructions/Symptoms: °Your throat may feel dry or sore from the anesthesia or the breathing tube placed in your throat during surgery. If this causes discomfort, gargle with warm salt water. The discomfort should disappear within 24 hours. ° ° °

## 2014-09-16 NOTE — Progress Notes (Signed)
Pt. Refused sedation for pectoral block

## 2014-09-16 NOTE — Op Note (Signed)
Left Breast radioactive seed localized Lumpectomy with Sentinel Node Biopsy Procedure Note   Indications: This patient presents with history of left breast cancer with clinically negative axillary lymph node exam.  Pre-operative Diagnosis: left breast cancer, [id number]  Post-operative Diagnosis: right breast cancer  Surgeon: [name]   Anesthesia: General endotracheal anesthesia  ASA Class: 3  Procedure Details  The patient was seen in the Holding Room. The risks, benefits, complications, treatment options, and expected outcomes were discussed with the patient. The possibilities of bleeding, infection, the need for additional procedures, failure to diagnose a condition, and creating a complication requiring transfusion or operation were discussed with the patient. The patient concurred with the proposed plan, giving informed consent.  The site of surgery properly noted/marked. The patient was taken to Operating Room # 3, identified, and the procedure verified as Breast Lumpectomy and Sentinal Node Biopsy. A Time Out was held and the above information confirmed.  The left arm, breast, and bilateral chest were prepped and draped in standard fashion.   The lumpectomy was performed by creating an oblique incision over the upper outer quadrant of the breast over the previously placed localization seed.  Dissection was carried down to the pectoral fascia.  The cautery was used to dissect around the point of maximum intensity   The edges of the cavity were marked with large clips, with one each medial, lateral, inferior and superior, and posteriorly.   The specimen was inked with the margin marker paint kit.    Specimen radiography confirmed inclusion of the mammographic lesion, the clip, and the seed.  Hemostasis was achieved with cautery.  The wound was irrigated.  Because of the proximity to the axilla, the probe was used to locate the sentinel nodes transcutaneously.      The same incision from  the lumpectomy was used.   Dissection was carried through the clavipectoral fascia.  2 level 2 axillary sentinel nodes were removed.  Counts per second were 430 and 380.   The background count was 1 cps.    The incision was closed with a 3-0 vicryl deep dermal interrupted sutures and a 4-0 monocryl subcuticular closure.  Sterile dressings were applied. At the end of the operation, all sponge, instrument, and needle counts were correct.  Findings: grossly clear surgical margins and no adenopathy  Estimated Blood Loss:  minimal         Specimens: L breast lumpectomy and 2 axillary sentinel nodes.           Complications:  None; patient tolerated the procedure well.         Disposition: PACU - hemodynamically stable.         Condition: stable

## 2014-09-16 NOTE — Anesthesia Postprocedure Evaluation (Signed)
Anesthesia Post Note  Patient: Lori Joseph  Procedure(s) Performed: Procedure(s) (LRB): RADIOACTIVE SEED GUIDED PARTIAL MASTECTOMY WITH AXILLARY SENTINEL LYMPH NODE BIOPSY (Left)  Anesthesia type: general  Patient location: PACU  Post pain: Pain level controlled  Post assessment: Patient's Cardiovascular Status Stable  Last Vitals:  Filed Vitals:   09/16/14 1515  BP: 140/63  Pulse: 78  Temp:   Resp: 14    Post vital signs: Reviewed and stable  Level of consciousness: sedated  Complications: No apparent anesthesia complications

## 2014-09-17 ENCOUNTER — Encounter (HOSPITAL_BASED_OUTPATIENT_CLINIC_OR_DEPARTMENT_OTHER): Payer: Self-pay | Admitting: General Surgery

## 2014-10-05 ENCOUNTER — Telehealth: Payer: Self-pay | Admitting: Hematology and Oncology

## 2014-10-05 ENCOUNTER — Ambulatory Visit (HOSPITAL_BASED_OUTPATIENT_CLINIC_OR_DEPARTMENT_OTHER): Payer: Medicare Other | Admitting: Hematology and Oncology

## 2014-10-05 ENCOUNTER — Ambulatory Visit (HOSPITAL_BASED_OUTPATIENT_CLINIC_OR_DEPARTMENT_OTHER): Payer: Medicare Other

## 2014-10-05 ENCOUNTER — Encounter: Payer: Self-pay | Admitting: *Deleted

## 2014-10-05 VITALS — BP 159/63 | HR 64 | Temp 97.9°F | Resp 20 | Ht 63.0 in | Wt 227.2 lb

## 2014-10-05 DIAGNOSIS — C50412 Malignant neoplasm of upper-outer quadrant of left female breast: Secondary | ICD-10-CM

## 2014-10-05 DIAGNOSIS — Z170 Estrogen receptor positive status [ER+]: Secondary | ICD-10-CM

## 2014-10-05 NOTE — Assessment & Plan Note (Signed)
Left breast invasive ductal carcinoma: 1.3 cm, SLN negative, ER positive PR positive HER-2 negative, Ki-67 17%, grade 2, T1 C. N0 M0 stage IA based on left lumpectomy.  Pathology and radiology counseling: I discussed with the patient extensively regarding the details of the pathology report from the lumpectomy. I explained the significance of estrogen progesterone positivity and HER-2 negativity. Patient understands implications of this result in terms of treatment. I also discussed the different treatment modalities in the adjuvant setting including radiation, chemotherapy and antiestrogen therapy.  Recommendation:I recommend sending for Oncotype DX testing to assess her prognosis as well as risk of recurrence as well as to determine whether or not she would benefit from systemic chemotherapy. Patient is very interested in this somewhat to know her risk of recurrence. She is also interested in chemotherapy if we felt it was going to benefit her in prolonging her life.  Return to clinic in 3 weeks for follow up on the testing results as well as to determine the follow up. If she does not require chemotherapy, she will then go on to receive radiation therapy that'll be followed by antiestrogen therapy.  Aromatase inhibitor counseling:We discussed the risks and benefits of anti-estrogen therapy with aromatase inhibitors. These include but not limited to insomnia, hot flashes, mood changes, vaginal dryness, bone density loss, and weight gain. Although rare, serious side effects including endometrial cancer, risk of blood clots were also discussed. We strongly believe that the benefits far outweigh the risks. Patient understands these risks and consented to starting treatment. Planned treatment duration is 5 years.

## 2014-10-05 NOTE — Telephone Encounter (Signed)
, °

## 2014-10-05 NOTE — Progress Notes (Signed)
Received order for oncotype dx testing from Dr. Lindi Adie. Sent requisition to pathology. Received by Tammy.

## 2014-10-05 NOTE — Progress Notes (Signed)
[facility] CONSULT NOTE  Patient Care Team: Colon Branch, MD as PCP - General  CHIEF COMPLAINTS/PURPOSE OF CONSULTATION:  Newly diagnosed breast cancer  HISTORY OF PRESENTING ILLNESS:  Lori Joseph 73 y.o. female is here because of recent diagnosis of Left-sided breast cancer. She had a routine mammogram that revealed abnormalities in the breast that led to a biopsy. 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 She is here today accompanied by husband to discuss adjuvant therapy options. She is recovering fairly well from surgery.  I reviewed her records extensively and collaborated the history with the patient.  In terms of breast cancer risk profile:  She menarched at early age of 78 and went to menopause at age 31 She had 2 pregnancy, her first child was born at age 55  She has received birth control pills for approximately 2 months.  She was never exposed to fertility medications or hormone replacement therapy.  She has  family history of Breast/GYN/GI cancer Father had lung cancer 78 years, mother stroke 38 ears, 2 brothers with lung cancer from 93 years old, sister with breast cancer diagnosed at age 18 now age 73, but this is to have melanoma 873  MEDICAL HISTORY:  Past Medical History  Diagnosis Date  . HYPERTENSION   . OSTEOPENIA   . HYPERLIPIDEMIA   . Allergic rhinitis   . H/O retinal detachment   . H/O acute pancreatitis   . Family history of anesthesia complication     daughter hard to wake up  . Cancer     br    SURGICAL HISTORY: Past Surgical History  Procedure Laterality Date  . Tubal ligation    . Laser eye surgery, detached retina Left   . Breast biopsy Left 08/27/14  . Cholecystectomy  2000  . Tonsillectomy    . Dilation and curettage of uterus    . Colonoscopy    . Radioactive seed guided mastectomy with  axillary sentinel lymph node biopsy Left 09/16/2014    Procedure: RADIOACTIVE SEED GUIDED PARTIAL MASTECTOMY WITH AXILLARY SENTINEL LYMPH NODE BIOPSY;  Surgeon: [name], MD;  Location: [facility];  Service: General;  Laterality: Left;    SOCIAL HISTORY: History   Social History  . Marital Status: Married    Spouse Name: Ruthann Cancer    Number of Children: 2  . 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 Smoking status: Never Smoker   . Smokeless tobacco: Not on file  . Alcohol Use: No     Comment: rarely wine  . Drug Use: No  . Sexual Activity: Not on file   Other Topics Concern  . Not on file   Social History Narrative   Lives w/ husband    FAMILY HISTORY: Family History  Problem Relation Age of Onset  . Colon cancer Neg Hx   . Breast cancer Sister 73  . CAD Neg Hx   . Diabetes Neg Hx     ALLERGIES:  is allergic to carvedilol; codeine; felodipine; lisinopril; and losartan potassium.  MEDICATIONS:  Current Outpatient Prescriptions  Medication Sig Dispense Refill  . aspirin 81 MG EC tablet Take 81 mg by mouth daily. Swallow whole.     . calcium carbonate 1250 MG capsule Take 1,250 mg by mouth daily.    Marland Kitchen  cholecalciferol (VITAMIN D) 1000 UNITS tablet Take 1,000 Units by mouth daily. 2000    . fluticasone (CUTIVATE) 0.005 % ointment Apply 1 application topically 3 (three) times daily as needed. 30 g 3  . latanoprost (XALATAN) 0.005 % ophthalmic solution Place 1 drop into both eyes at bedtime.    . Melatonin 5 MG TABS Take by mouth.    . Multiple Vitamin (MULTIVITAMIN) tablet Take 1 tablet by mouth daily.      Marland Kitchen olmesartan (BENICAR) 40 MG tablet TAKE 1 TABLET (40 MG TOTAL) BY MOUTH DAILY. 90 tablet 2  . pravastatin (PRAVACHOL) 40 MG tablet Take 1 tablet (40 mg total) by mouth daily. 30 tablet 12  . fluticasone (FLONASE) 50 MCG/ACT nasal spray Place 2 sprays into the nose daily as  needed.     No current facility-administered medications for this visit.    REVIEW OF SYSTEMS:   Constitutional: Denies fevers, chills or abnormal night sweats Eyes: Denies blurriness of vision, double vision or watery eyes Ears, nose, mouth, throat, and face: Denies mucositis or sore throat Respiratory: Denies cough, dyspnea or wheezes Cardiovascular: Denies palpitation, chest discomfort or lower extremity swelling Gastrointestinal:  Denies nausea, heartburn or change in bowel habits Skin: Denies abnormal skin rashes Lymphatics: Denies new lymphadenopathy or easy bruising Neurological:Denies numbness, tingling or new weaknesses Behavioral/Psych: Mood is stable, no new changes  Breast: recovering well from left lumpectomy All other systems were reviewed with the patient and are negative.  PHYSICAL EXAMINATION: ECOG PERFORMANCE STATUS: 1 - Symptomatic but completely ambulatory  Filed Vitals:   10/05/14 1259  BP: 159/63  Pulse: 64  Temp: 97.9 F (36.6 C)  Resp: 20   Filed Weights   10/05/14 1259  Weight: 227 lb 3.2 oz (103.057 kg)    GENERAL:alert, no distress and comfortable SKIN: skin color, texture, turgor are normal, no rashes or significant lesions EYES: normal, conjunctiva are pink and non-injected, sclera clear OROPHARYNX:no exudate, no erythema and lips, buccal mucosa, and tongue normal  NECK: supple, thyroid normal size, non-tender, without nodularity LYMPH:  no palpable lymphadenopathy in the cervical, axillary or inguinal LUNGS: clear to auscultation and percussion with normal breathing effort HEART: regular rate & rhythm and no murmurs and no lower extremity edema ABDOMEN:abdomen soft, non-tender and normal bowel sounds Musculoskeletal:no cyanosis of digits and no clubbing  PSYCH: alert & oriented x 3 with fluent speech NEURO: no focal motor/sensory deficits  LABORATORY DATA:  I have reviewed the data as listed Lab Results  Component Value Date   WBC 4.3  08/05/2014   HGB 10.9* 09/16/2014   HCT 38.2 08/05/2014   MCV 90.4 08/05/2014   PLT 177.0 08/05/2014   Lab Results  Component Value Date   NA 140 09/15/2014   K 4.2 09/15/2014   CL 104 09/15/2014   CO2 21 09/15/2014    RADIOGRAPHIC STUDIES: I have personally reviewed the radiological reports and agreed with the findings in the report.  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 N0 M0 stage IA based on left lumpectomy.  Pathology and radiology counseling: I discussed with the patient extensively regarding the details of the pathology report from the lumpectomy. I explained the significance of estrogen progesterone positivity and HER-2 negativity. Patient understands implications of this result in terms of treatment. I also discussed the different treatment modalities in the adjuvant  setting including radiation, chemotherapy and antiestrogen therapy.  Recommendation:I recommend sending for Oncotype DX testing to assess her prognosis as well as risk of recurrence as well as to determine whether or not she would benefit from systemic chemotherapy. Patient is very interested in this somewhat to know her risk of recurrence. She is also interested in chemotherapy if we felt it was going to benefit her in prolonging her life.  Return to clinic in 3 weeks for follow up on the testing results as well as to determine the follow up. If she does not require chemotherapy, she will then go on to receive radiation therapy that'll be followed by antiestrogen therapy.  Aromatase inhibitor counseling:We discussed the risks and benefits of anti-estrogen therapy with aromatase inhibitors. These include but not limited to insomnia, hot flashes, mood changes, vaginal dryness, bone density loss, and weight gain. Although rare, serious side effects  including endometrial cancer, risk of blood clots were also discussed. We strongly believe that the benefits far outweigh the risks. Patient understands these risks and consented to starting treatment. Planned treatment duration is 5 years.    All questions were answered. The patient knows to call the clinic with any problems, questions or concerns. I spent 55 minutes counseling the patient face to face. The total time spent in the appointment was 60 minutes and more than 50% was on counseling.     Rulon Eisenmenger, MD 10/05/2014 4:50 PM

## 2014-10-06 NOTE — Progress Notes (Signed)
Note created by Dr. Gudena during office visit. Copy to patient, original to scan. 

## 2014-10-19 ENCOUNTER — Other Ambulatory Visit: Payer: Self-pay | Admitting: Hematology and Oncology

## 2014-10-19 ENCOUNTER — Encounter (HOSPITAL_COMMUNITY): Payer: Self-pay

## 2014-10-19 ENCOUNTER — Encounter: Payer: Self-pay | Admitting: *Deleted

## 2014-10-19 DIAGNOSIS — C50412 Malignant neoplasm of upper-outer quadrant of left female breast: Secondary | ICD-10-CM

## 2014-10-19 NOTE — Progress Notes (Signed)
Received oncotype score of 15. Copy given to Dr. Lindi Adie. Physician team notified. Original sent to HIM.

## 2014-10-25 NOTE — Progress Notes (Signed)
Location of Breast Cancer: upper outer quadrant of left breast  Histology per Pathology Report:   08/27/14 Diagnosis Breast, left, needle core biopsy, mass, 1 o'clock - INVASIVE MAMMARY CARCINOMA.  09/16/14 Diagnosis 1. Breast, lumpectomy, Left partial - INVASIVE DUCTAL CARCINOMA SEE COMMENT. - NEGATIVE FOR LYMPH VASCULAR INVASION. - INVASIVE TUMOR IS LESS THAN 0.1 MM FROM NEAREST MEDIAL MARGIN. - INVASIVE TUMOR IS 5 MM FROM NEAREST SUPERIOR MARGIN. - SEE TUMOR SYNOPTIC TEMPLATE BELOW. 2. Lymph node, sentinel, biopsy, Left axillary - ONE LYMPH NODE, NEGATIVE FOR TUMOR (0/1). 3. Lymph node, sentinel, biopsy, Left axillary - ONE LYMPH NODE, NEGATIVE FOR TUMOR (0/1).  Receptor Status: ER(100% +), PR (100% +), Her2-neu (negative)  Did patient present with symptoms (if so, please note symptoms) or was this found on screening mammography?: mammogram  Past/Anticipated interventions by surgeon, if any: 09/16/14 - Procedure: RADIOACTIVE SEED GUIDED PARTIAL MASTECTOMY WITH AXILLARY SENTINEL LYMPH NODE BIOPSY;  Surgeon: Faera Byerly, MD;  Location: [facility];  Service: General;  Laterality: Left;  Past/Anticipated interventions by medical oncology, if any: she will not have chemotherapy and will start Arimidex after radiation.  Lymphedema issues, if any: no  Pain issues, if any: no   OBGYN history: 73 years old at menarche, Gravida 2/Para 2, age at birth of first child 21. Used prevera and premarin for 20 years.  SAFETY ISSUES:  Prior radiation? no  Pacemaker/ICD? no  Possible current pregnancy? no  Is the patient on methotrexate? no  Current Complaints / other details: Patient has noticed swelling her right neck area that started after surgery.  Dr. Gudena has ordered and ultrasound on 11/02/14.    

## 2014-10-26 ENCOUNTER — Ambulatory Visit (HOSPITAL_BASED_OUTPATIENT_CLINIC_OR_DEPARTMENT_OTHER): Payer: Medicare Other | Admitting: Hematology and Oncology

## 2014-10-26 ENCOUNTER — Telehealth: Payer: Self-pay | Admitting: Hematology and Oncology

## 2014-10-26 DIAGNOSIS — Z170 Estrogen receptor positive status [ER+]: Secondary | ICD-10-CM

## 2014-10-26 DIAGNOSIS — R221 Localized swelling, mass and lump, neck: Secondary | ICD-10-CM

## 2014-10-26 DIAGNOSIS — C50412 Malignant neoplasm of upper-outer quadrant of left female breast: Secondary | ICD-10-CM

## 2014-10-26 MED ORDER — ANASTROZOLE 1 MG PO TABS: 1.0000 mg | ORAL_TABLET | Freq: Every day | ORAL | Status: DC

## 2014-10-26 NOTE — Addendum Note (Signed)
Addended by: Prentiss Bells on: 10/26/2014 04:40 PM   Modules accepted: Medications

## 2014-10-26 NOTE — Progress Notes (Signed)
Patient Care Team: Colon Branch, MD as PCP - General  DIAGNOSIS: No matching staging information was found for the patient.  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 N0 M0 Oncotype DX recurrence score 15, 9% ROR    CHIEF COMPLIANT: followup of Oncotype DX testing  INTERVAL HISTORY: Lori Joseph is a 73 year old Caucasian with above-mentioned history of left-sided breast cancer treated with lumpectomy, she underwent Oncotype DX testing and is here today to discuss the results. She reports no new complaints or concerns other than a slight fullness in the right side of the neck she reports this happened after surgery. It is slowly increasing in size and she does not have any symptoms associated with it. She denies any pain or discomfort.  REVIEW OF SYSTEMS:   Constitutional: Denies fevers, chills or abnormal weight loss Eyes: Denies blurriness of vision Ears, nose, mouth, throat, and face: rounded fullness in the right lower part of the neck Respiratory: Denies cough, dyspnea or wheezes Cardiovascular: Denies palpitation, chest discomfort or lower extremity swelling Gastrointestinal:  Denies nausea, heartburn or change in bowel habits Skin: Denies abnormal skin rashes Lymphatics: Denies new lymphadenopathy or easy bruising Neurological:Denies numbness, tingling or new weaknesses Behavioral/Psych: Mood is stable, no new changes  Breast:  denies any pain or lumps or nodules in either breasts All other systems were reviewed with the patient and are negative.  I have reviewed the past medical history, past surgical history, social history and family history with the patient and they are unchanged from previous note.  ALLERGIES:  is allergic to carvedilol; codeine; felodipine;  lisinopril; and losartan potassium.  MEDICATIONS:  Current Outpatient Prescriptions  Medication Sig Dispense Refill  . anastrozole (ARIMIDEX) 1 MG tablet Take 1 tablet (1 mg total) by mouth daily. 90 tablet 3  . aspirin 81 MG EC tablet Take 81 mg by mouth daily. Swallow whole.     . calcium carbonate 1250 MG capsule Take 1,250 mg by mouth daily.    . cholecalciferol (VITAMIN D) 1000 UNITS tablet Take 1,000 Units by mouth daily. 2000    . fluticasone (CUTIVATE) 0.005 % ointment Apply 1 application topically 3 (three) times daily as needed. 30 g 3  . fluticasone (FLONASE) 50 MCG/ACT nasal spray Place 2 sprays into the nose daily as needed.    . latanoprost (XALATAN) 0.005 % ophthalmic solution Place 1 drop into both eyes at bedtime.    . Melatonin 5 MG TABS Take by mouth.    . Multiple Vitamin (MULTIVITAMIN) tablet Take 1 tablet by mouth daily.      Marland Kitchen olmesartan (BENICAR) 40 MG tablet TAKE 1 TABLET (40 MG TOTAL) BY MOUTH DAILY. 90 tablet 2  . pravastatin (PRAVACHOL) 40 MG tablet Take 1 tablet (40 mg total) by mouth daily. 30 tablet 12   No current facility-administered medications for this visit.    PHYSICAL EXAMINATION: ECOG PERFORMANCE STATUS: 0 - Asymptomatic  Filed Vitals:   10/26/14 1353  BP: 151/61  Pulse: 67  Temp: 97.7 F (36.5 C)  Resp: 18   Filed Weights   10/26/14 1353  Weight: 224 lb 8 oz (101.833 kg)    GENERAL:alert, no distress and comfortable SKIN: skin color, texture, turgor are normal, no rashes or significant lesions EYES: normal, Conjunctiva are  pink and non-injected, sclera clear OROPHARYNX:no exudate, no erythema and lips, buccal mucosa, and tongue normal  NECK: supple, thyroid normal size, non-tender, without nodularity LYMPH:  no palpable lymphadenopathy in the cervical, axillary or inguinal LUNGS: clear to auscultation and percussion with normal breathing effort HEART: regular rate & rhythm and no murmurs and no lower extremity edema ABDOMEN:abdomen  soft, non-tender and normal bowel sounds Musculoskeletal:no cyanosis of digits and no clubbing  NEURO: alert & oriented x 3 with fluent speech, no focal motor/sensory deficits  LABORATORY DATA:  I have reviewed the data as listed   Chemistry      Component Value Date/Time   NA 140 09/15/2014 1430   K 4.2 09/15/2014 1430   CL 104 09/15/2014 1430   CO2 21 09/15/2014 1430   BUN 18 09/15/2014 1430   CREATININE 0.80 09/15/2014 1430      Component Value Date/Time   CALCIUM 9.8 09/15/2014 1430   ALKPHOS 82 10/15/2007 0000   AST 26 10/16/2013 1019   ALT 25 10/16/2013 1019   BILITOT 0.9 10/15/2007 0000       Lab Results  Component Value Date   WBC 4.3 08/05/2014   HGB 10.9* 09/16/2014   HCT 38.2 08/05/2014   MCV 90.4 08/05/2014   PLT 177.0 08/05/2014   NEUTROABS 2.6 08/05/2014   ASSESSMENT & PLAN:  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 N0 M0 stage IA based on left lumpectomy. Oncotype DX recurrence score is 15 ( 9% ROR)  Oncotype DX review: I discussed with her that based on Oncotype DX score of 15, she has low risk of recurrence. With antiestrogen therapy alone, risk of recurrence is at 9%. There is no benefit to systemic chemotherapy. Patient will return back to see [name] after the radiation is complete to follow up on toxicities related to starting Arimidex therapy. I provided her with a prescription for Arimidex today. She understands to start this after radiation is complete.  Return to clinic in March for followup on antiestrogen therapy.  Right neck Mass: patient reports that since surgery she felt a fullness in the right side of the neck is not tender it does not appear to be infected I would like to obtain an ultrasound of the neck for further categorization.   Orders Placed This Encounter  Procedures  . US Soft Tissue  Head/Neck    Standing Status: Future     Number of Occurrences:      Standing Expiration Date: 12/26/2015    Order Specific Question:  Reason for exam:    Answer:  Right neck lump (recent breast surgery)    Order Specific Question:  Preferred imaging location?    Answer:  [facility]   The patient has a good understanding of the overall plan. she agrees with it. She will call with any problems that may develop before her next visit here.   Rulon Eisenmenger, MD 10/26/2014 2:52 PM

## 2014-10-26 NOTE — Assessment & Plan Note (Addendum)
Left breast invasive ductal carcinoma: 1.3 cm, SLN negative, ER positive PR positive HER-2 negative, Ki-67 17%, grade 2, T1 C. N0 M0 stage IA based on left lumpectomy. Oncotype DX recurrence score is 15 ( 9% ROR)  Oncotype DX review: I discussed with her that based on Oncotype DX score of 15, she has low risk of recurrence. With antiestrogen therapy alone, risk of recurrence is at 9%. There is no benefit to systemic chemotherapy. Patient will return back to see [name] after the radiation is complete to follow up on toxicities related to starting Arimidex therapy. I provided her with a prescription for Arimidex today. She understands to start this after radiation is complete.  Return to clinic in March for followup on antiestrogen therapy.  Right neck Mass: patient reports that since surgery she felt a fullness in the right side of the neck is not tender it does not appear to be infected I would like to obtain an ultrasound of the neck for further categorization.

## 2014-10-26 NOTE — Telephone Encounter (Signed)
, °

## 2014-10-28 ENCOUNTER — Ambulatory Visit
Admission: RE | Admit: 2014-10-28 | Discharge: 2014-10-28 | Disposition: A | Discharge status: Home / Self Care | Payer: Medicare Other | Source: Ambulatory Visit | Attending: Radiation Oncology | Admitting: Radiation Oncology

## 2014-10-28 ENCOUNTER — Encounter: Payer: Self-pay | Admitting: Radiation Oncology

## 2014-10-28 VITALS — BP 110/80 | HR 70 | Temp 97.9°F | Resp 16 | Ht 63.0 in | Wt 223.3 lb

## 2014-10-28 DIAGNOSIS — L599 Disorder of the skin and subcutaneous tissue related to radiation, unspecified: Secondary | ICD-10-CM | POA: Diagnosis not present

## 2014-10-28 DIAGNOSIS — Z510 Encounter for antineoplastic radiation therapy: Secondary | ICD-10-CM | POA: Diagnosis not present

## 2014-10-28 DIAGNOSIS — C50412 Malignant neoplasm of upper-outer quadrant of left female breast: Secondary | ICD-10-CM

## 2014-10-28 DIAGNOSIS — Z9012 Acquired absence of left breast and nipple: Secondary | ICD-10-CM | POA: Diagnosis not present

## 2014-10-28 NOTE — Progress Notes (Signed)
Please see the Nurse Progress Note in the MD Initial Consult Encounter for this patient. 

## 2014-10-28 NOTE — Progress Notes (Signed)
Radiation Oncology         (336) [phone] ________________________________  Name: Lori Joseph MRN: [id number]  Date: 10/28/2014  DOB: 1941/10/25  Re-evaluation Note  CC: Kathlene November, MD  [name], MD    ICD-9-CM ICD-10-CM   1. Breast cancer of upper-outer quadrant of left female breast 174.4 C50.412     Diagnosis:   Stage I invasive ductal carcinoma of the left breast, T1 C., N0, MX    Narrative:  The patient returns today for further evaluation. Patient was initially seen in the multidisciplinary breast clinic. Since that initial evaluation the patient has undergone her definitive surgery by Dr. Barry Dienes. Patient underwent partial mastectomy and sentinel node procedure. Patient was found to have a 1.3 cm invasive ductal carcinoma. The surgical margins were close at less than 1 mm from the medial margin and 5 mm from the superior margin. 2 benign lymph nodes recovered, surgery. Patient is done well since her surgery. She did meet with medical oncology and Oncotype DX test was performed showed low risk for recurrence. At a later date the patient will proceed with hormonal therapy. Patient is now seen in radiation oncology for breast conservation treatment.                           ALLERGIES:  is allergic to carvedilol; codeine; felodipine; lisinopril; and losartan potassium.  Meds: Current Outpatient Prescriptions  Medication Sig Dispense Refill  . aspirin 81 MG EC tablet Take 81 mg by mouth daily. Swallow whole.     . calcium carbonate 1250 MG capsule Take 1,250 mg by mouth daily.    . cholecalciferol (VITAMIN D) 1000 UNITS tablet Take 1,000 Units by mouth daily. 2000    . dorzolamide-timolol (COSOPT) 22.3-6.8 MG/ML ophthalmic solution Place 1 drop into both eyes daily.    . Melatonin 5 MG TABS Take by mouth.    . Multiple Vitamin (MULTIVITAMIN) tablet Take 1 tablet by mouth daily.      Marland Kitchen olmesartan (BENICAR) 40 MG tablet TAKE 1 TABLET (40 MG TOTAL) BY MOUTH DAILY. 90 tablet 2  .  pravastatin (PRAVACHOL) 40 MG tablet Take 1 tablet (40 mg total) by mouth daily. 30 tablet 12  . anastrozole (ARIMIDEX) 1 MG tablet Take 1 tablet (1 mg total) by mouth daily. (Patient not taking: Reported on 10/28/2014) 90 tablet 3  . fluticasone (CUTIVATE) 0.005 % ointment Apply 1 application topically 3 (three) times daily as needed. (Patient not taking: Reported on 10/28/2014) 30 g 3  . fluticasone (FLONASE) 50 MCG/ACT nasal spray Place 2 sprays into the nose daily as needed.    . latanoprost (XALATAN) 0.005 % ophthalmic solution Place 1 drop into both eyes at bedtime.     No current facility-administered medications for this encounter.    Physical Findings: The patient is in no acute distress. Patient is alert and oriented.  height is 5\' 3"  (1.6 m) and weight is 223 lb 4.8 oz (101.288 kg). Her oral temperature is 97.9 F (36.6 C). Her blood pressure is 110/80 and her pulse is 70. Her respiration is 16. Marland Kitchen  No palpable subclavicular or axillary adenopathy. Lungs are clear to auscultation. The heart has regular rhythm and rate. The right breast is free of mass or nipple discharge. The left breast shows a well-healed scar in the upper outer quadrant. The patient's sentinel node procedure was performed through the same incision.  Lab Findings: Lab Results  Component Value Date  WBC 4.3 08/05/2014   HGB 10.9* 09/16/2014   HCT 38.2 08/05/2014   MCV 90.4 08/05/2014   PLT 177.0 08/05/2014    Radiographic Findings: No results found.  Impression: stage I invasive ductal carcinoma of the left breast. The patient would be a good candidate for breast conservation with radiation therapy directed at the left breast.  In light of the close surgical margins I would not recommend adjuvant hormonal therapy alone in this situation. I discussed the treatment course side effects and potential toxicities of radiation therapy the patient. She appears to understand and wishes to proceed with planned course of  treatment.  Plan:   Patient will undergo simulation on December 8 with treatments to begin soon afterwards.  She will receive hypo-fractionated  treatment if technically possible.   ____________________________________ Blair Promise, MD

## 2014-11-02 ENCOUNTER — Ambulatory Visit (HOSPITAL_COMMUNITY)
Admission: RE | Admit: 2014-11-02 | Discharge: 2014-11-02 | Disposition: A | Discharge status: Home / Self Care | Payer: Medicare Other | Source: Ambulatory Visit | Attending: Hematology and Oncology | Admitting: Hematology and Oncology

## 2014-11-02 ENCOUNTER — Ambulatory Visit
Admission: RE | Admit: 2014-11-02 | Discharge: 2014-11-02 | Disposition: A | Discharge status: Home / Self Care | Payer: Medicare Other | Source: Ambulatory Visit | Attending: Radiation Oncology | Admitting: Radiation Oncology

## 2014-11-02 DIAGNOSIS — C50412 Malignant neoplasm of upper-outer quadrant of left female breast: Secondary | ICD-10-CM | POA: Diagnosis not present

## 2014-11-02 DIAGNOSIS — E041 Nontoxic single thyroid nodule: Secondary | ICD-10-CM | POA: Diagnosis present

## 2014-11-02 DIAGNOSIS — Z510 Encounter for antineoplastic radiation therapy: Secondary | ICD-10-CM | POA: Diagnosis not present

## 2014-11-02 NOTE — Progress Notes (Signed)
  Radiation Oncology         (336) [phone] ________________________________  Name: Lori Joseph MRN: [id number]  Date: 11/02/2014  DOB: 04/03/1941  SIMULATION AND TREATMENT PLANNING NOTE    ICD-9-CM ICD-10-CM   1. Breast cancer of upper-outer quadrant of left female breast 174.4 C50.412       NARRATIVE:  The patient was brought to the [facility].  Identity was confirmed.  All relevant records and images related to the planned course of therapy were reviewed.  The patient freely provided informed written consent to proceed with treatment after reviewing the details related to the planned course of therapy. The consent form was witnessed and verified by the simulation staff.  Then, the patient was set-up in a stable reproducible  supine position for radiation therapy.  CT images were obtained.  Surface markings were placed.  The CT images were loaded into the planning software.  Then the target and avoidance structures were contoured.  Treatment planning then occurred.  The radiation prescription was entered and confirmed.  Then, I designed and supervised the construction of a total of 3 medically necessary complex treatment devices.  I have requested : 3D Simulation  I have requested a DVH of the following structures: lumpectomy cavity, heart, lungs.  I have ordered:dose calc.  PLAN:  The patient will receive 42.72 Gy in 16 fractions followed by a boost to the lumpectomy cavity for a cumulative dose of 52.72 gray.  ________________________________  -----------------------------------  Blair Promise, PhD, MD

## 2014-11-02 NOTE — Progress Notes (Signed)
  Radiation Oncology         ([phone] ________________________________  Name: Lori Joseph MRN: [id number]  Date: 11/02/2014  DOB: 17-Feb-1941  Optical Surface Tracking Plan:  Since intensity modulated radiotherapy (IMRT) and 3D conformal radiation treatment methods are predicated on accurate and precise positioning for treatment, intrafraction motion monitoring is medically necessary to ensure accurate and safe treatment delivery.  The ability to quantify intrafraction motion without excessive ionizing radiation dose can only be performed with optical surface tracking. Accordingly, surface imaging offers the opportunity to obtain 3D measurements of patient position throughout IMRT and 3D treatments without excessive radiation exposure.  I am ordering optical surface tracking for this patient's upcoming course of radiotherapy. ________________________________  Blair Promise, MD 11/02/2014 6:11 PM    Reference:   Ursula Alert, J, et al. Surface imaging-based analysis of intrafraction motion for breast radiotherapy patients.Journal of [place], n. 6, nov. 2014. ISSN [id number].   Available at: <http://www.jacmp.org/index.php/jacmp/article/view/4957>.

## 2014-11-02 NOTE — Addendum Note (Signed)
Encounter addended by: Blair Promise, MD on: 11/02/2014  6:11 PM<BR>     Documentation filed: Notes Section

## 2014-11-04 ENCOUNTER — Other Ambulatory Visit: Payer: Medicare Other

## 2014-11-04 DIAGNOSIS — C50412 Malignant neoplasm of upper-outer quadrant of left female breast: Secondary | ICD-10-CM | POA: Insufficient documentation

## 2014-11-04 DIAGNOSIS — L599 Disorder of the skin and subcutaneous tissue related to radiation, unspecified: Secondary | ICD-10-CM | POA: Insufficient documentation

## 2014-11-04 DIAGNOSIS — Z9012 Acquired absence of left breast and nipple: Secondary | ICD-10-CM | POA: Insufficient documentation

## 2014-11-04 DIAGNOSIS — Z510 Encounter for antineoplastic radiation therapy: Secondary | ICD-10-CM | POA: Insufficient documentation

## 2014-11-08 DIAGNOSIS — Z510 Encounter for antineoplastic radiation therapy: Secondary | ICD-10-CM | POA: Diagnosis not present

## 2014-11-09 ENCOUNTER — Ambulatory Visit
Admission: RE | Admit: 2014-11-09 | Discharge: 2014-11-09 | Disposition: A | Discharge status: Home / Self Care | Payer: Medicare Other | Source: Ambulatory Visit | Attending: Radiation Oncology | Admitting: Radiation Oncology

## 2014-11-09 DIAGNOSIS — C50412 Malignant neoplasm of upper-outer quadrant of left female breast: Secondary | ICD-10-CM

## 2014-11-09 DIAGNOSIS — Z510 Encounter for antineoplastic radiation therapy: Secondary | ICD-10-CM | POA: Diagnosis not present

## 2014-11-10 ENCOUNTER — Ambulatory Visit
Admission: RE | Admit: 2014-11-10 | Discharge: 2014-11-10 | Disposition: A | Discharge status: Home / Self Care | Payer: Medicare Other | Source: Ambulatory Visit | Attending: Radiation Oncology | Admitting: Radiation Oncology

## 2014-11-10 DIAGNOSIS — C50412 Malignant neoplasm of upper-outer quadrant of left female breast: Secondary | ICD-10-CM

## 2014-11-10 DIAGNOSIS — Z510 Encounter for antineoplastic radiation therapy: Secondary | ICD-10-CM | POA: Diagnosis not present

## 2014-11-10 MED ORDER — RADIAPLEXRX EX GEL
Freq: Once | CUTANEOUS | Status: AC
  Administered 2014-11-10: 15:00:00 via TOPICAL

## 2014-11-10 MED ORDER — ALRA NON-METALLIC DEODORANT (RAD-ONC)
1.0000 "application " | Freq: Once | TOPICAL | Status: AC
  Administered 2014-11-10: 1 via TOPICAL

## 2014-11-10 NOTE — Progress Notes (Signed)
  Radiation Oncology         [phone] ________________________________  Name: Lori Joseph MRN: [id number]  Date: 11/09/2014  DOB: 1941-06-15  Simulation Verification Note    ICD-9-CM ICD-10-CM   1. Breast cancer of upper-outer quadrant of left female breast 174.4 C50.412     Status: outpatient  NARRATIVE: The patient was brought to the treatment unit and placed in the planned treatment position. The clinical setup was verified. Then port films were obtained and uploaded to the radiation oncology medical record software.  The treatment beams were carefully compared against the planned radiation fields. The position location and shape of the radiation fields was reviewed. They targeted volume of tissue appears to be appropriately covered by the radiation beams. Organs at risk appear to be excluded as planned.  Based on my personal review, I approved the simulation verification. The patient's treatment will proceed as planned.  -----------------------------------  Blair Promise, PhD, MD

## 2014-11-10 NOTE — Progress Notes (Signed)
Lori Joseph was given the Radiation Therapy and You book and discussed potential side effects/management of fatigue and skin changes.  She was given the skin care handout along with Alra Deoderant and Radiaplex gel.  She was instructed to apply the radiaplex gel to her left breast/underarm twice a day, after treatment and bedtime.  She was educated about under treat day with Dr. Sondra Come on Tuesday's.  She was encouraged to call with questions or concerns.

## 2014-11-11 ENCOUNTER — Ambulatory Visit
Admission: RE | Admit: 2014-11-11 | Discharge: 2014-11-11 | Disposition: A | Discharge status: Home / Self Care | Payer: Medicare Other | Source: Ambulatory Visit | Attending: Radiation Oncology | Admitting: Radiation Oncology

## 2014-11-11 DIAGNOSIS — Z510 Encounter for antineoplastic radiation therapy: Secondary | ICD-10-CM | POA: Diagnosis not present

## 2014-11-12 ENCOUNTER — Ambulatory Visit
Admission: RE | Admit: 2014-11-12 | Discharge: 2014-11-12 | Disposition: A | Discharge status: Home / Self Care | Payer: Medicare Other | Source: Ambulatory Visit | Attending: Radiation Oncology | Admitting: Radiation Oncology

## 2014-11-12 DIAGNOSIS — Z510 Encounter for antineoplastic radiation therapy: Secondary | ICD-10-CM | POA: Diagnosis not present

## 2014-11-15 ENCOUNTER — Ambulatory Visit
Admission: RE | Admit: 2014-11-15 | Discharge: 2014-11-15 | Disposition: A | Discharge status: Home / Self Care | Payer: Medicare Other | Source: Ambulatory Visit | Attending: Radiation Oncology | Admitting: Radiation Oncology

## 2014-11-15 DIAGNOSIS — Z510 Encounter for antineoplastic radiation therapy: Secondary | ICD-10-CM | POA: Diagnosis not present

## 2014-11-16 ENCOUNTER — Ambulatory Visit
Admission: RE | Admit: 2014-11-16 | Discharge: 2014-11-16 | Disposition: A | Discharge status: Home / Self Care | Payer: Medicare Other | Source: Ambulatory Visit | Attending: Radiation Oncology | Admitting: Radiation Oncology

## 2014-11-16 ENCOUNTER — Encounter: Payer: Self-pay | Admitting: Radiation Oncology

## 2014-11-16 VITALS — BP 176/61 | HR 64 | Temp 97.9°F | Resp 20 | Wt 219.7 lb

## 2014-11-16 DIAGNOSIS — Z510 Encounter for antineoplastic radiation therapy: Secondary | ICD-10-CM | POA: Diagnosis not present

## 2014-11-16 DIAGNOSIS — C50412 Malignant neoplasm of upper-outer quadrant of left female breast: Secondary | ICD-10-CM | POA: Insufficient documentation

## 2014-11-16 MED ORDER — BIAFINE EX EMUL
Freq: Every day | CUTANEOUS | Status: DC
  Administered 2014-11-16: 14:00:00 via TOPICAL

## 2014-11-16 NOTE — Progress Notes (Addendum)
Patient denies pain but states she has been having muscle pain in her lower right side x 1 1/2 weeks. She states she has history of this pain, had a scan done and was told it was muscular. She states she has medication for  This pain but has not taken in past week. Patient denies other pain, denies fatigue, loss of appetite. She is applying radiaplex to left breast treatment area, states the deodorant caused a rash under her arm. She is using [employer] [place].

## 2014-11-16 NOTE — Progress Notes (Signed)
  Radiation Oncology         [phone] ________________________________  Name: Lori Joseph MRN: [id number]  Date: 11/16/2014  DOB: Oct 22, 1941  Weekly Radiation Therapy Management    ICD-9-CM ICD-10-CM   1. Breast cancer of upper-outer quadrant of left female breast         Current Dose: 9 Gy     Planned Dose:  60.4 Gy  Narrative . . . . . . . . The patient presents for routine under treatment assessment.                                   The patient is without complaint except for a rash in the left axillary area. This appears to be to her new deodorant.  She will be switched to another.  She also has a mild rash in the breast region and will be switched to Biafine. She does have some itching associated with this rash in the inframammary fold.                                 Set-up films were reviewed.                                 The chart was checked. Physical Findings. . .  weight is 219 lb 11.2 oz (99.655 kg). Her oral temperature is 97.9 F (36.6 C). Her blood pressure is 176/61 and her pulse is 64. Her respiration is 20. Marland Kitchen Erythema in the left axillary area without skin breakdown patient has a mild maculopapular rash along the inframammary fold and medial breast area. Impression . . . . . . . The patient is tolerating radiation. Plan . . . . . . . . . . . . Continue treatment as planned.  ________________________________   Blair Promise, PhD, MD

## 2014-11-16 NOTE — Addendum Note (Signed)
Encounter addended by: Heywood Footman, RN on: 11/16/2014  1:37 PM<BR>     Documentation filed: [employer], Inpatient MAR, Orders

## 2014-11-17 ENCOUNTER — Ambulatory Visit
Admission: RE | Admit: 2014-11-17 | Discharge: 2014-11-17 | Disposition: A | Discharge status: Home / Self Care | Payer: Medicare Other | Source: Ambulatory Visit | Attending: Radiation Oncology | Admitting: Radiation Oncology

## 2014-11-17 ENCOUNTER — Telehealth: Payer: Self-pay | Admitting: Internal Medicine

## 2014-11-17 DIAGNOSIS — Z510 Encounter for antineoplastic radiation therapy: Secondary | ICD-10-CM | POA: Diagnosis not present

## 2014-11-17 NOTE — Telephone Encounter (Signed)
Please disregard previous message pt called back and stated it was not our office that called

## 2014-11-17 NOTE — Telephone Encounter (Signed)
Noted  

## 2014-11-17 NOTE — Telephone Encounter (Signed)
Caller name:Gardiner Paddy Relation to OU:ZHQU Call back [phone] Pharmacy:  Reason for call: pt states her husband received a call today stating for her to call and make an appt with dr. Larose Kells in 2 months. Pt does not know who called her there is no notes in the system. Pt wanted confirmation if it was our office that called.

## 2014-11-18 ENCOUNTER — Ambulatory Visit
Admission: RE | Admit: 2014-11-18 | Discharge: 2014-11-18 | Disposition: A | Discharge status: Home / Self Care | Payer: Medicare Other | Source: Ambulatory Visit | Attending: Radiation Oncology | Admitting: Radiation Oncology

## 2014-11-18 DIAGNOSIS — Z510 Encounter for antineoplastic radiation therapy: Secondary | ICD-10-CM | POA: Diagnosis not present

## 2014-11-22 ENCOUNTER — Ambulatory Visit
Admission: RE | Admit: 2014-11-22 | Discharge: 2014-11-22 | Disposition: A | Discharge status: Home / Self Care | Payer: Medicare Other | Source: Ambulatory Visit | Attending: Radiation Oncology | Admitting: Radiation Oncology

## 2014-11-22 DIAGNOSIS — Z510 Encounter for antineoplastic radiation therapy: Secondary | ICD-10-CM | POA: Diagnosis not present

## 2014-11-23 ENCOUNTER — Ambulatory Visit
Admission: RE | Admit: 2014-11-23 | Discharge: 2014-11-23 | Disposition: A | Discharge status: Home / Self Care | Payer: Medicare Other | Source: Ambulatory Visit | Attending: Radiation Oncology | Admitting: Radiation Oncology

## 2014-11-23 ENCOUNTER — Encounter: Payer: Self-pay | Admitting: Radiation Oncology

## 2014-11-23 VITALS — BP 140/90 | HR 60 | Temp 97.6°F | Resp 20 | Wt 218.7 lb

## 2014-11-23 DIAGNOSIS — Z510 Encounter for antineoplastic radiation therapy: Secondary | ICD-10-CM | POA: Diagnosis not present

## 2014-11-23 DIAGNOSIS — C50412 Malignant neoplasm of upper-outer quadrant of left female breast: Secondary | ICD-10-CM

## 2014-11-23 NOTE — Progress Notes (Addendum)
Weekly rad txs left breast 9/28 completed, no skin chnages, scant pink on breast,  radiaplex used bid, no pain, no nausa, no fatigue,appetite good,drinks plenty water,no pain 1:31 PM

## 2014-11-23 NOTE — Progress Notes (Signed)
Weekly Management Note Current Dose: 16.2  Gy  Projected Dose: 60.4  Narrative:  The patient presents for routine under treatment assessment.  CBCT/MVCT images/Port film x-rays were reviewed.  The chart was checked. Doing well. No complaints.   Physical Findings: Weight: 218 lb 11.2 oz (99.202 kg). Unchanged.   Impression:  The patient is tolerating radiation.  Plan:  Continue treatment as planned. Continue radiaplex bid.

## 2014-11-24 ENCOUNTER — Ambulatory Visit
Admission: RE | Admit: 2014-11-24 | Discharge: 2014-11-24 | Disposition: A | Discharge status: Home / Self Care | Payer: Medicare Other | Source: Ambulatory Visit | Attending: Radiation Oncology | Admitting: Radiation Oncology

## 2014-11-24 DIAGNOSIS — Z510 Encounter for antineoplastic radiation therapy: Secondary | ICD-10-CM | POA: Diagnosis not present

## 2014-11-25 ENCOUNTER — Ambulatory Visit
Admission: RE | Admit: 2014-11-25 | Discharge: 2014-11-25 | Disposition: A | Discharge status: Home / Self Care | Payer: Medicare Other | Source: Ambulatory Visit | Attending: Radiation Oncology | Admitting: Radiation Oncology

## 2014-11-25 DIAGNOSIS — Z510 Encounter for antineoplastic radiation therapy: Secondary | ICD-10-CM | POA: Diagnosis not present

## 2014-11-29 ENCOUNTER — Ambulatory Visit
Admission: RE | Admit: 2014-11-29 | Discharge: 2014-11-29 | Disposition: A | Discharge status: Home / Self Care | Payer: Medicare Other | Source: Ambulatory Visit | Attending: Radiation Oncology | Admitting: Radiation Oncology

## 2014-11-29 DIAGNOSIS — C50412 Malignant neoplasm of upper-outer quadrant of left female breast: Secondary | ICD-10-CM | POA: Diagnosis not present

## 2014-11-29 DIAGNOSIS — C50411 Malignant neoplasm of upper-outer quadrant of right female breast: Secondary | ICD-10-CM | POA: Diagnosis not present

## 2014-11-29 DIAGNOSIS — L599 Disorder of the skin and subcutaneous tissue related to radiation, unspecified: Secondary | ICD-10-CM | POA: Diagnosis not present

## 2014-11-29 DIAGNOSIS — Z9012 Acquired absence of left breast and nipple: Secondary | ICD-10-CM | POA: Diagnosis not present

## 2014-11-29 DIAGNOSIS — Z510 Encounter for antineoplastic radiation therapy: Secondary | ICD-10-CM | POA: Diagnosis not present

## 2014-11-30 ENCOUNTER — Ambulatory Visit
Admission: RE | Admit: 2014-11-30 | Discharge: 2014-11-30 | Disposition: A | Discharge status: Home / Self Care | Payer: Medicare Other | Source: Ambulatory Visit | Attending: Radiation Oncology | Admitting: Radiation Oncology

## 2014-11-30 ENCOUNTER — Encounter: Payer: Self-pay | Admitting: Radiation Oncology

## 2014-11-30 VITALS — BP 179/73 | HR 61 | Temp 97.8°F | Resp 20 | Ht 63.0 in | Wt 218.4 lb

## 2014-11-30 DIAGNOSIS — C50412 Malignant neoplasm of upper-outer quadrant of left female breast: Secondary | ICD-10-CM | POA: Diagnosis not present

## 2014-11-30 DIAGNOSIS — L599 Disorder of the skin and subcutaneous tissue related to radiation, unspecified: Secondary | ICD-10-CM | POA: Diagnosis not present

## 2014-11-30 DIAGNOSIS — C50411 Malignant neoplasm of upper-outer quadrant of right female breast: Secondary | ICD-10-CM | POA: Diagnosis not present

## 2014-11-30 DIAGNOSIS — Z510 Encounter for antineoplastic radiation therapy: Secondary | ICD-10-CM | POA: Diagnosis not present

## 2014-11-30 DIAGNOSIS — Z9012 Acquired absence of left breast and nipple: Secondary | ICD-10-CM | POA: Diagnosis not present

## 2014-11-30 NOTE — Progress Notes (Signed)
  Radiation Oncology         [phone] ________________________________  Name: Lori Joseph MRN: [id number]  Date: 11/30/2014  DOB: 06/19/1941  Weekly Radiation Therapy Management  Breast cancer of upper-outer quadrant of left female breast 174.4 C50.412     Current Dose: 23.4 Gy     Planned Dose:  ~60.4 Gy  Narrative . . . . . . . . The patient presents for routine under treatment assessment.                                   The patient is without complaint. She denies any itching or discomfort within the left breast area.                                 Set-up films were reviewed.                                 The chart was checked. Physical Findings. . .  height is 5\' 3"  (1.6 m) and weight is 218 lb 6.4 oz (99.066 kg). Her oral temperature is 97.8 F (36.6 C). Her blood pressure is 179/73 and her pulse is 61. Her respiration is 20. . The lungs are clear. The heart has regular rhythm and rate. Examination of the left breast reveals some mild erythema and hyperpigmentation changes. Impression . . . . . . . The patient is tolerating radiation. Plan . . . . . . . . . . . . Continue treatment as planned.  ________________________________   Blair Promise, PhD, MD

## 2014-11-30 NOTE — Progress Notes (Signed)
Lori Joseph has completed 13 fractions to her left breast.  She denies pain and fatigue.  She continues to work full time.  She reports being diagnosed with a goiter in her right neck this week.  Also her eyes are red especially her left eye.  She reports they are a little itchy.  She has a call in to her primary care doctor.  The skin on her left breast is intact with slight hyperpigmentation underneath her breast.  She is using biafine cream.

## 2014-12-01 ENCOUNTER — Telehealth: Payer: Self-pay | Admitting: Internal Medicine

## 2014-12-01 ENCOUNTER — Ambulatory Visit
Admission: RE | Admit: 2014-12-01 | Discharge: 2014-12-01 | Disposition: A | Discharge status: Home / Self Care | Payer: Medicare Other | Source: Ambulatory Visit | Attending: Radiation Oncology | Admitting: Radiation Oncology

## 2014-12-01 DIAGNOSIS — Z9012 Acquired absence of left breast and nipple: Secondary | ICD-10-CM | POA: Diagnosis not present

## 2014-12-01 DIAGNOSIS — C50412 Malignant neoplasm of upper-outer quadrant of left female breast: Secondary | ICD-10-CM | POA: Diagnosis not present

## 2014-12-01 DIAGNOSIS — C50411 Malignant neoplasm of upper-outer quadrant of right female breast: Secondary | ICD-10-CM | POA: Diagnosis not present

## 2014-12-01 DIAGNOSIS — L599 Disorder of the skin and subcutaneous tissue related to radiation, unspecified: Secondary | ICD-10-CM | POA: Diagnosis not present

## 2014-12-01 DIAGNOSIS — Z510 Encounter for antineoplastic radiation therapy: Secondary | ICD-10-CM | POA: Diagnosis not present

## 2014-12-01 NOTE — Telephone Encounter (Signed)
Caller name: [name] Relation to pt: self Call back number: [phone] Pharmacy:  Reason for call:   Patient would like to know if we received fax from patient's oncologist stating that Dr. Larose Kells would need to refer patient to endocrinologist?

## 2014-12-01 NOTE — Telephone Encounter (Signed)
Not that I'm aware of 

## 2014-12-01 NOTE — Telephone Encounter (Signed)
Left message for patient to return my call.

## 2014-12-02 ENCOUNTER — Ambulatory Visit
Admission: RE | Admit: 2014-12-02 | Discharge: 2014-12-02 | Disposition: A | Discharge status: Home / Self Care | Payer: Medicare Other | Source: Ambulatory Visit | Attending: Radiation Oncology | Admitting: Radiation Oncology

## 2014-12-02 DIAGNOSIS — C50412 Malignant neoplasm of upper-outer quadrant of left female breast: Secondary | ICD-10-CM | POA: Diagnosis not present

## 2014-12-02 DIAGNOSIS — Z9012 Acquired absence of left breast and nipple: Secondary | ICD-10-CM | POA: Diagnosis not present

## 2014-12-02 DIAGNOSIS — Z510 Encounter for antineoplastic radiation therapy: Secondary | ICD-10-CM | POA: Diagnosis not present

## 2014-12-02 DIAGNOSIS — L599 Disorder of the skin and subcutaneous tissue related to radiation, unspecified: Secondary | ICD-10-CM | POA: Diagnosis not present

## 2014-12-02 DIAGNOSIS — C50411 Malignant neoplasm of upper-outer quadrant of right female breast: Secondary | ICD-10-CM | POA: Diagnosis not present

## 2014-12-03 ENCOUNTER — Telehealth: Payer: Self-pay | Admitting: Internal Medicine

## 2014-12-03 ENCOUNTER — Ambulatory Visit
Admission: RE | Admit: 2014-12-03 | Discharge: 2014-12-03 | Disposition: A | Discharge status: Home / Self Care | Payer: Medicare Other | Source: Ambulatory Visit | Attending: Radiation Oncology | Admitting: Radiation Oncology

## 2014-12-03 DIAGNOSIS — Z510 Encounter for antineoplastic radiation therapy: Secondary | ICD-10-CM | POA: Diagnosis not present

## 2014-12-03 DIAGNOSIS — E041 Nontoxic single thyroid nodule: Secondary | ICD-10-CM

## 2014-12-03 DIAGNOSIS — Z9012 Acquired absence of left breast and nipple: Secondary | ICD-10-CM | POA: Diagnosis not present

## 2014-12-03 DIAGNOSIS — C50412 Malignant neoplasm of upper-outer quadrant of left female breast: Secondary | ICD-10-CM | POA: Diagnosis not present

## 2014-12-03 DIAGNOSIS — L599 Disorder of the skin and subcutaneous tissue related to radiation, unspecified: Secondary | ICD-10-CM | POA: Diagnosis not present

## 2014-12-03 DIAGNOSIS — C50411 Malignant neoplasm of upper-outer quadrant of right female breast: Secondary | ICD-10-CM | POA: Diagnosis not present

## 2014-12-03 NOTE — Telephone Encounter (Signed)
The patient brought a copy of the results of the ultrasound done 11/02/2014, the results are actually in our system, another physician ordered the  ultrasound of the thyroid and it did show  aright solid thyroid mass 2.4 cm along w/ others smaller nodules Advise patient I review the results and enter a order for a thyroid biopsy. The other nodules needs to be follow-up in 6-12 months.

## 2014-12-03 NOTE — Telephone Encounter (Signed)
LMOM for Pt to return call.  

## 2014-12-06 ENCOUNTER — Ambulatory Visit
Admission: RE | Admit: 2014-12-06 | Discharge: 2014-12-06 | Disposition: A | Discharge status: Home / Self Care | Payer: Medicare Other | Source: Ambulatory Visit | Attending: Radiation Oncology | Admitting: Radiation Oncology

## 2014-12-06 ENCOUNTER — Telehealth: Payer: Self-pay | Admitting: Internal Medicine

## 2014-12-06 DIAGNOSIS — Z510 Encounter for antineoplastic radiation therapy: Secondary | ICD-10-CM | POA: Diagnosis not present

## 2014-12-06 DIAGNOSIS — L599 Disorder of the skin and subcutaneous tissue related to radiation, unspecified: Secondary | ICD-10-CM | POA: Diagnosis not present

## 2014-12-06 DIAGNOSIS — Z9012 Acquired absence of left breast and nipple: Secondary | ICD-10-CM | POA: Diagnosis not present

## 2014-12-06 DIAGNOSIS — C50412 Malignant neoplasm of upper-outer quadrant of left female breast: Secondary | ICD-10-CM | POA: Diagnosis not present

## 2014-12-06 DIAGNOSIS — C50411 Malignant neoplasm of upper-outer quadrant of right female breast: Secondary | ICD-10-CM | POA: Diagnosis not present

## 2014-12-06 MED ORDER — VALACYCLOVIR HCL 1 G PO TABS: 2000.0000 mg | ORAL_TABLET | Freq: Two times a day (BID) | ORAL | Status: DC

## 2014-12-06 NOTE — Telephone Encounter (Signed)
Pt returning your call. Best [phone]

## 2014-12-06 NOTE — Telephone Encounter (Signed)
Please advise 

## 2014-12-06 NOTE — Telephone Encounter (Signed)
Caller name: Amaal  Relation to pt: self Call back number: [phone] Pharmacy: [facility] on [address]  Reason for call:   Patient states that she has a fever blister and would like something called in.

## 2014-12-06 NOTE — Telephone Encounter (Signed)
Tried calling Pt on cell number given, number not currently working, unsure if she was out of range. Called home number and left message that Dr. Larose Kells has looked over thyroid ultrasound and would like for her to have thyroid biopsy completed (orders entered). Instructed her to inform us if she does not hear from someone to make appt for biopsy in several days.

## 2014-12-06 NOTE — Telephone Encounter (Signed)
Advise patient, I just send Valtrex 2 tablets BID, only needs 2 doses. I notice that she is doing radiation therapy, if she  does not respond will probably need daily medication. Additionally, we have been trying to contact her about a thyroid biopsy, see previous phone note.

## 2014-12-07 ENCOUNTER — Ambulatory Visit
Admission: RE | Admit: 2014-12-07 | Discharge: 2014-12-07 | Disposition: A | Discharge status: Home / Self Care | Payer: Medicare Other | Source: Ambulatory Visit | Attending: Radiation Oncology | Admitting: Radiation Oncology

## 2014-12-07 ENCOUNTER — Encounter: Payer: Self-pay | Admitting: Radiation Oncology

## 2014-12-07 VITALS — BP 174/92 | HR 66 | Temp 97.9°F | Resp 16 | Ht 63.0 in | Wt 218.2 lb

## 2014-12-07 DIAGNOSIS — Z510 Encounter for antineoplastic radiation therapy: Secondary | ICD-10-CM | POA: Diagnosis not present

## 2014-12-07 DIAGNOSIS — L599 Disorder of the skin and subcutaneous tissue related to radiation, unspecified: Secondary | ICD-10-CM | POA: Diagnosis not present

## 2014-12-07 DIAGNOSIS — Z9012 Acquired absence of left breast and nipple: Secondary | ICD-10-CM | POA: Diagnosis not present

## 2014-12-07 DIAGNOSIS — C50412 Malignant neoplasm of upper-outer quadrant of left female breast: Secondary | ICD-10-CM

## 2014-12-07 DIAGNOSIS — C50411 Malignant neoplasm of upper-outer quadrant of right female breast: Secondary | ICD-10-CM | POA: Diagnosis not present

## 2014-12-07 NOTE — Progress Notes (Signed)
  Radiation Oncology         [phone] ________________________________  Name: Lori Joseph MRN: [id number]  Date: 12/07/2014  DOB: [birth year]  Weekly Radiation Therapy Management  Breast cancer of upper-outer quadrant of left female breast 174.4 C50.412     Current Dose: 32.4 Gy     Planned Dose:  62.4 Gy  Narrative . . . . . . . . The patient presents for routine under treatment assessment.                                   The patient is without complaint  for some mild itching in the treatment area.                                 Set-up films were reviewed.                                 The chart was checked. Physical Findings. . .  height is 5\' 3"  (1.6 m) and weight is 218 lb 3.2 oz (98.975 kg). Her oral temperature is 97.9 F (36.6 C). Her blood pressure is 174/92 and her pulse is 66. Her respiration is 16. . The left breast area shows some erythema and hyperpigmentation changes. No skin breakdown is appreciated. Impression . . . . . . . The patient is tolerating radiation. Plan . . . . . . . . . . . . Continue treatment as planned.  ________________________________   Blair Promise, PhD, MD

## 2014-12-07 NOTE — Progress Notes (Signed)
Lori Joseph has completed 18 fractions to her left breast.  She denies pain and fatigue.  The skin on her left breast is red with hyperpigmentation underneath her breast.  She reports the center of her chest is itching and she is using hydrocortisone cream on this area.  She is also using biafine cream twice a day.

## 2014-12-07 NOTE — Telephone Encounter (Signed)
Pt informed of Valtrex 2 po bid for 2 days. Pt has thyroid bx scheduled for 1/26 at 3:30.

## 2014-12-08 ENCOUNTER — Ambulatory Visit
Admission: RE | Admit: 2014-12-08 | Discharge: 2014-12-08 | Disposition: A | Discharge status: Home / Self Care | Payer: Medicare Other | Source: Ambulatory Visit | Attending: Radiation Oncology | Admitting: Radiation Oncology

## 2014-12-08 ENCOUNTER — Encounter: Payer: Self-pay | Admitting: Radiation Oncology

## 2014-12-08 DIAGNOSIS — Z510 Encounter for antineoplastic radiation therapy: Secondary | ICD-10-CM | POA: Diagnosis not present

## 2014-12-08 DIAGNOSIS — C50411 Malignant neoplasm of upper-outer quadrant of right female breast: Secondary | ICD-10-CM | POA: Diagnosis not present

## 2014-12-08 DIAGNOSIS — Z9012 Acquired absence of left breast and nipple: Secondary | ICD-10-CM | POA: Diagnosis not present

## 2014-12-08 DIAGNOSIS — C50412 Malignant neoplasm of upper-outer quadrant of left female breast: Secondary | ICD-10-CM | POA: Diagnosis not present

## 2014-12-08 DIAGNOSIS — L599 Disorder of the skin and subcutaneous tissue related to radiation, unspecified: Secondary | ICD-10-CM | POA: Diagnosis not present

## 2014-12-08 NOTE — Progress Notes (Signed)
  Radiation Oncology         [phone] ________________________________  Name: Lori Joseph MRN: [id number]  Date: 12/08/2014  DOB: 1941-10-26  Simulation Note  No diagnosis found.  Status: outpatient  NARRATIVE: The patient was underwent additional planning for radiation therapy directed at the left breast area. The patient's treatment planning CT scan was reviewed and the patient had set up of a boost field directed at the lumpectomy cavity within the breast. The patient will be treated with a reduced field photon set up using a combination of 6 and 10 MV photons.  Beams. The patient will receive 2 grays fractions for 6 treatments for a boost dose of 12 gray and a cumulative dose to the lumpectomy cavity of 62.4 gray. A computerized isodose plan is requested for treatment.  -----------------------------------  Blair Promise, PhD, MD

## 2014-12-09 ENCOUNTER — Ambulatory Visit: Payer: Medicare Other

## 2014-12-09 ENCOUNTER — Other Ambulatory Visit: Payer: Self-pay | Admitting: Obstetrics and Gynecology

## 2014-12-09 DIAGNOSIS — C50411 Malignant neoplasm of upper-outer quadrant of right female breast: Secondary | ICD-10-CM | POA: Diagnosis not present

## 2014-12-09 DIAGNOSIS — C50412 Malignant neoplasm of upper-outer quadrant of left female breast: Secondary | ICD-10-CM | POA: Diagnosis not present

## 2014-12-09 DIAGNOSIS — Z510 Encounter for antineoplastic radiation therapy: Secondary | ICD-10-CM | POA: Diagnosis not present

## 2014-12-09 DIAGNOSIS — L599 Disorder of the skin and subcutaneous tissue related to radiation, unspecified: Secondary | ICD-10-CM | POA: Diagnosis not present

## 2014-12-09 DIAGNOSIS — Z779 Other contact with and (suspected) exposures hazardous to health: Secondary | ICD-10-CM | POA: Diagnosis not present

## 2014-12-09 DIAGNOSIS — Z01419 Encounter for gynecological examination (general) (routine) without abnormal findings: Secondary | ICD-10-CM | POA: Diagnosis not present

## 2014-12-09 DIAGNOSIS — Z124 Encounter for screening for malignant neoplasm of cervix: Secondary | ICD-10-CM | POA: Diagnosis not present

## 2014-12-09 DIAGNOSIS — Z9012 Acquired absence of left breast and nipple: Secondary | ICD-10-CM | POA: Diagnosis not present

## 2014-12-10 ENCOUNTER — Ambulatory Visit: Payer: Medicare Other

## 2014-12-10 DIAGNOSIS — L599 Disorder of the skin and subcutaneous tissue related to radiation, unspecified: Secondary | ICD-10-CM | POA: Diagnosis not present

## 2014-12-10 DIAGNOSIS — Z9012 Acquired absence of left breast and nipple: Secondary | ICD-10-CM | POA: Diagnosis not present

## 2014-12-10 DIAGNOSIS — C50412 Malignant neoplasm of upper-outer quadrant of left female breast: Secondary | ICD-10-CM | POA: Diagnosis not present

## 2014-12-10 DIAGNOSIS — Z510 Encounter for antineoplastic radiation therapy: Secondary | ICD-10-CM | POA: Diagnosis not present

## 2014-12-10 DIAGNOSIS — C50411 Malignant neoplasm of upper-outer quadrant of right female breast: Secondary | ICD-10-CM | POA: Diagnosis not present

## 2014-12-10 LAB — CYTOLOGY - PAP

## 2014-12-13 ENCOUNTER — Ambulatory Visit
Admission: RE | Admit: 2014-12-13 | Discharge: 2014-12-13 | Disposition: A | Discharge status: Home / Self Care | Payer: Medicare Other | Source: Ambulatory Visit | Attending: Radiation Oncology | Admitting: Radiation Oncology

## 2014-12-13 DIAGNOSIS — C50411 Malignant neoplasm of upper-outer quadrant of right female breast: Secondary | ICD-10-CM | POA: Diagnosis not present

## 2014-12-13 DIAGNOSIS — Z510 Encounter for antineoplastic radiation therapy: Secondary | ICD-10-CM | POA: Diagnosis not present

## 2014-12-13 DIAGNOSIS — L599 Disorder of the skin and subcutaneous tissue related to radiation, unspecified: Secondary | ICD-10-CM | POA: Diagnosis not present

## 2014-12-13 DIAGNOSIS — Z9012 Acquired absence of left breast and nipple: Secondary | ICD-10-CM | POA: Diagnosis not present

## 2014-12-13 DIAGNOSIS — C50412 Malignant neoplasm of upper-outer quadrant of left female breast: Secondary | ICD-10-CM | POA: Diagnosis not present

## 2014-12-14 ENCOUNTER — Ambulatory Visit
Admission: RE | Admit: 2014-12-14 | Discharge: 2014-12-14 | Disposition: A | Discharge status: Home / Self Care | Payer: Medicare Other | Source: Ambulatory Visit | Attending: Radiation Oncology | Admitting: Radiation Oncology

## 2014-12-14 ENCOUNTER — Encounter: Payer: Self-pay | Admitting: Radiation Oncology

## 2014-12-14 VITALS — BP 138/76 | HR 70 | Temp 97.6°F | Resp 20 | Wt 217.9 lb

## 2014-12-14 DIAGNOSIS — Z510 Encounter for antineoplastic radiation therapy: Secondary | ICD-10-CM | POA: Diagnosis not present

## 2014-12-14 DIAGNOSIS — C50412 Malignant neoplasm of upper-outer quadrant of left female breast: Secondary | ICD-10-CM | POA: Diagnosis not present

## 2014-12-14 DIAGNOSIS — C50411 Malignant neoplasm of upper-outer quadrant of right female breast: Secondary | ICD-10-CM | POA: Diagnosis not present

## 2014-12-14 DIAGNOSIS — Z9012 Acquired absence of left breast and nipple: Secondary | ICD-10-CM | POA: Diagnosis not present

## 2014-12-14 DIAGNOSIS — L599 Disorder of the skin and subcutaneous tissue related to radiation, unspecified: Secondary | ICD-10-CM | POA: Diagnosis not present

## 2014-12-14 NOTE — Progress Notes (Signed)
Weekly rad txs left breast 23/33 completd, erythema, under inframmary fold skin thinning, but intact, no itching, no c/o pain, using radiaplex bid, no nausea, no fatigue, appetite great 1:50 PM

## 2014-12-14 NOTE — Progress Notes (Signed)
  Radiation Oncology         [phone] ________________________________  Name: Lori Joseph MRN: [id number]  Date: 12/14/2014  DOB: Sep 14, 1941  Weekly Radiation Therapy Management  Breast cancer of upper-outer quadrant of left female breast 174.4 C50.412     Current Dose: 41.4 Gy     Planned Dose:  62.4 Gy  Narrative . . . . . . . . The patient presents for routine under treatment assessment.                                   The patient is without complaint.  She continues to tolerate the treatments quite well                                 Set-up films were reviewed.                                 The chart was checked. Physical Findings. . .  weight is 217 lb 14.4 oz (98.839 kg). Her oral temperature is 97.6 F (36.4 C). Her blood pressure is 138/76 and her pulse is 70. Her respiration is 20. . The lungs are clear. The heart has a regular rhythm and rate. The left breast area shows some erythema and hyperpigmentation changes. No moist desquamation Impression . . . . . . . The patient is tolerating radiation. Plan . . . . . . . . . . . . Continue treatment as planned.  ________________________________   Blair Promise, PhD, MD

## 2014-12-15 ENCOUNTER — Ambulatory Visit
Admission: RE | Admit: 2014-12-15 | Discharge: 2014-12-15 | Disposition: A | Discharge status: Home / Self Care | Payer: Medicare Other | Source: Ambulatory Visit | Attending: Radiation Oncology | Admitting: Radiation Oncology

## 2014-12-15 DIAGNOSIS — L599 Disorder of the skin and subcutaneous tissue related to radiation, unspecified: Secondary | ICD-10-CM | POA: Diagnosis not present

## 2014-12-15 DIAGNOSIS — Z9012 Acquired absence of left breast and nipple: Secondary | ICD-10-CM | POA: Diagnosis not present

## 2014-12-15 DIAGNOSIS — Z510 Encounter for antineoplastic radiation therapy: Secondary | ICD-10-CM | POA: Diagnosis not present

## 2014-12-15 DIAGNOSIS — C50411 Malignant neoplasm of upper-outer quadrant of right female breast: Secondary | ICD-10-CM | POA: Diagnosis not present

## 2014-12-15 DIAGNOSIS — C50412 Malignant neoplasm of upper-outer quadrant of left female breast: Secondary | ICD-10-CM | POA: Diagnosis not present

## 2014-12-16 ENCOUNTER — Ambulatory Visit
Admission: RE | Admit: 2014-12-16 | Discharge: 2014-12-16 | Disposition: A | Discharge status: Home / Self Care | Payer: Medicare Other | Source: Ambulatory Visit | Attending: Radiation Oncology | Admitting: Radiation Oncology

## 2014-12-16 DIAGNOSIS — C50412 Malignant neoplasm of upper-outer quadrant of left female breast: Secondary | ICD-10-CM | POA: Diagnosis not present

## 2014-12-16 DIAGNOSIS — Z9012 Acquired absence of left breast and nipple: Secondary | ICD-10-CM | POA: Diagnosis not present

## 2014-12-16 DIAGNOSIS — Z510 Encounter for antineoplastic radiation therapy: Secondary | ICD-10-CM | POA: Diagnosis not present

## 2014-12-16 DIAGNOSIS — L599 Disorder of the skin and subcutaneous tissue related to radiation, unspecified: Secondary | ICD-10-CM | POA: Diagnosis not present

## 2014-12-16 DIAGNOSIS — C50411 Malignant neoplasm of upper-outer quadrant of right female breast: Secondary | ICD-10-CM | POA: Diagnosis not present

## 2014-12-17 ENCOUNTER — Ambulatory Visit
Admission: RE | Admit: 2014-12-17 | Discharge: 2014-12-17 | Disposition: A | Discharge status: Home / Self Care | Payer: Medicare Other | Source: Ambulatory Visit | Attending: Radiation Oncology | Admitting: Radiation Oncology

## 2014-12-17 DIAGNOSIS — C50411 Malignant neoplasm of upper-outer quadrant of right female breast: Secondary | ICD-10-CM | POA: Diagnosis not present

## 2014-12-17 DIAGNOSIS — Z9012 Acquired absence of left breast and nipple: Secondary | ICD-10-CM | POA: Diagnosis not present

## 2014-12-17 DIAGNOSIS — C50412 Malignant neoplasm of upper-outer quadrant of left female breast: Secondary | ICD-10-CM | POA: Diagnosis not present

## 2014-12-17 DIAGNOSIS — L599 Disorder of the skin and subcutaneous tissue related to radiation, unspecified: Secondary | ICD-10-CM | POA: Diagnosis not present

## 2014-12-17 DIAGNOSIS — Z510 Encounter for antineoplastic radiation therapy: Secondary | ICD-10-CM | POA: Diagnosis not present

## 2014-12-20 ENCOUNTER — Ambulatory Visit: Payer: Medicare Other

## 2014-12-21 ENCOUNTER — Ambulatory Visit
Admission: RE | Admit: 2014-12-21 | Discharge: 2014-12-21 | Disposition: A | Discharge status: Home / Self Care | Payer: Medicare Other | Source: Ambulatory Visit | Attending: Internal Medicine | Admitting: Internal Medicine

## 2014-12-21 ENCOUNTER — Telehealth: Payer: Self-pay | Admitting: *Deleted

## 2014-12-21 ENCOUNTER — Other Ambulatory Visit (HOSPITAL_COMMUNITY)
Admission: RE | Admit: 2014-12-21 | Discharge: 2014-12-21 | Disposition: A | Discharge status: Home / Self Care | Payer: Medicare Other | Source: Ambulatory Visit | Attending: Interventional Radiology | Admitting: Interventional Radiology

## 2014-12-21 ENCOUNTER — Ambulatory Visit
Admission: RE | Admit: 2014-12-21 | Discharge: 2014-12-21 | Disposition: A | Discharge status: Home / Self Care | Payer: Medicare Other | Source: Ambulatory Visit | Attending: Radiation Oncology | Admitting: Radiation Oncology

## 2014-12-21 ENCOUNTER — Encounter: Payer: Self-pay | Admitting: Radiation Oncology

## 2014-12-21 VITALS — BP 150/78 | HR 67 | Temp 97.9°F | Resp 12 | Ht 63.0 in | Wt 217.2 lb

## 2014-12-21 DIAGNOSIS — C50412 Malignant neoplasm of upper-outer quadrant of left female breast: Secondary | ICD-10-CM

## 2014-12-21 DIAGNOSIS — N644 Mastodynia: Secondary | ICD-10-CM | POA: Diagnosis not present

## 2014-12-21 DIAGNOSIS — E041 Nontoxic single thyroid nodule: Secondary | ICD-10-CM | POA: Insufficient documentation

## 2014-12-21 DIAGNOSIS — L599 Disorder of the skin and subcutaneous tissue related to radiation, unspecified: Secondary | ICD-10-CM | POA: Diagnosis not present

## 2014-12-21 DIAGNOSIS — Z9012 Acquired absence of left breast and nipple: Secondary | ICD-10-CM | POA: Diagnosis not present

## 2014-12-21 DIAGNOSIS — R221 Localized swelling, mass and lump, neck: Secondary | ICD-10-CM | POA: Diagnosis not present

## 2014-12-21 DIAGNOSIS — Z510 Encounter for antineoplastic radiation therapy: Secondary | ICD-10-CM | POA: Diagnosis not present

## 2014-12-21 DIAGNOSIS — C50411 Malignant neoplasm of upper-outer quadrant of right female breast: Secondary | ICD-10-CM | POA: Diagnosis not present

## 2014-12-21 MED ORDER — BIAFINE EX EMUL
Freq: Once | CUTANEOUS | Status: AC
Start: 2014-12-21 — End: 2014-12-21
  Administered 2014-12-21: 16:00:00 via TOPICAL

## 2014-12-21 NOTE — Telephone Encounter (Signed)
CALLED PATIENT TO INFORM OF LABS, SCAN ON 12-24-14, LVM FOR A RETURN CALL

## 2014-12-21 NOTE — Progress Notes (Signed)
  Radiation Oncology         (336) [phone] ________________________________  Name: Lori Joseph MRN: [id number]  Date: 12/21/2014  DOB: 02-02-1941  Weekly Radiation Therapy Management  Breast cancer of upper-outer quadrant of left female breast 174.4 C50.412     Current Dose: 48.6 Gy     Planned Dose:  62.4 Gy  Narrative . . . . . . . . The patient presents for routine under treatment assessment.                                   The patient is without complaint concerning her breast cancer treatment however she is having significant pain in the right upper quadrant/right flank area. This is making it difficult for her to sleep. She denies any numbness or weakness in her lower extremities are urinary symptoms or hematuria                                 Set-up films were reviewed.                                 The chart was checked. Physical Findings. . .  height is 5\' 3"  (1.6 m) and weight is 217 lb 3.2 oz (98.521 kg). Her oral temperature is 97.9 F (36.6 C). Her blood pressure is 150/78 and her pulse is 67. Her respiration is 12 and oxygen saturation is 100%. . Patient is tender with palpation along the right lower rib cage area and right upper quadrant region,  no rebound or guarding. No obvious palpable mass. Impression . . . . . . . The patient is tolerating radiation. Plan . . . . . . . . . . . . Continue treatment as planned. Patient will be set up for abdominal CT scan for further evaluation of her pain.  ________________________________   Blair Promise, PhD, MD

## 2014-12-21 NOTE — Progress Notes (Signed)
Lori Joseph has completed 27 fractions to her left breast.  She continues to have pain in her right side.  She said it hurts when she lays down at night and getting off the exam table.  She reports that the pain is getting worse.  She was told it was a muscle pull and had a scan done 2 years ago.  She denies fatigue.  The skin on her left breast and underarm is red.  She is using baifine and has requested a refill.  Another tube has been given.

## 2014-12-22 ENCOUNTER — Ambulatory Visit
Admission: RE | Admit: 2014-12-22 | Discharge: 2014-12-22 | Disposition: A | Discharge status: Home / Self Care | Payer: Medicare Other | Source: Ambulatory Visit | Attending: Radiation Oncology | Admitting: Radiation Oncology

## 2014-12-22 DIAGNOSIS — L599 Disorder of the skin and subcutaneous tissue related to radiation, unspecified: Secondary | ICD-10-CM | POA: Diagnosis not present

## 2014-12-22 DIAGNOSIS — C50411 Malignant neoplasm of upper-outer quadrant of right female breast: Secondary | ICD-10-CM | POA: Diagnosis not present

## 2014-12-22 DIAGNOSIS — Z9012 Acquired absence of left breast and nipple: Secondary | ICD-10-CM | POA: Diagnosis not present

## 2014-12-22 DIAGNOSIS — C50412 Malignant neoplasm of upper-outer quadrant of left female breast: Secondary | ICD-10-CM | POA: Diagnosis not present

## 2014-12-22 DIAGNOSIS — Z510 Encounter for antineoplastic radiation therapy: Secondary | ICD-10-CM | POA: Diagnosis not present

## 2014-12-23 ENCOUNTER — Encounter: Payer: Self-pay | Admitting: Radiation Oncology

## 2014-12-23 ENCOUNTER — Ambulatory Visit
Admission: RE | Admit: 2014-12-23 | Discharge: 2014-12-23 | Disposition: A | Discharge status: Home / Self Care | Payer: Medicare Other | Source: Ambulatory Visit | Attending: Radiation Oncology | Admitting: Radiation Oncology

## 2014-12-23 DIAGNOSIS — Z510 Encounter for antineoplastic radiation therapy: Secondary | ICD-10-CM | POA: Diagnosis not present

## 2014-12-23 DIAGNOSIS — L599 Disorder of the skin and subcutaneous tissue related to radiation, unspecified: Secondary | ICD-10-CM | POA: Diagnosis not present

## 2014-12-23 DIAGNOSIS — C50411 Malignant neoplasm of upper-outer quadrant of right female breast: Secondary | ICD-10-CM | POA: Diagnosis not present

## 2014-12-23 DIAGNOSIS — C50412 Malignant neoplasm of upper-outer quadrant of left female breast: Secondary | ICD-10-CM | POA: Diagnosis not present

## 2014-12-23 DIAGNOSIS — Z9012 Acquired absence of left breast and nipple: Secondary | ICD-10-CM | POA: Diagnosis not present

## 2014-12-23 NOTE — Progress Notes (Signed)
  Radiation Oncology         [phone] ________________________________  Name: Lori Joseph MRN: [id number]  Date: 12/23/2014  DOB: 07-06-1941  Simulation Verification Note  No diagnosis found.  Status: outpatient  NARRATIVE: The patient was brought to the treatment unit and placed in the planned treatment position. The clinical setup was verified. Then port films were obtained and uploaded to the radiation oncology medical record software.  The treatment beams were carefully compared against the planned radiation fields. The position location and shape of the radiation fields was reviewed. They targeted volume of tissue appears to be appropriately covered by the radiation beams. Organs at risk appear to be excluded as planned.  Based on my personal review, I approved the simulation verification. The patient's treatment will proceed as planned.  -----------------------------------  Blair Promise, PhD, MD

## 2014-12-24 ENCOUNTER — Ambulatory Visit (HOSPITAL_COMMUNITY)
Admission: RE | Admit: 2014-12-24 | Discharge: 2014-12-24 | Disposition: A | Discharge status: Home / Self Care | Payer: Medicare Other | Source: Ambulatory Visit | Attending: Radiation Oncology | Admitting: Radiation Oncology

## 2014-12-24 ENCOUNTER — Ambulatory Visit
Admission: RE | Admit: 2014-12-24 | Discharge: 2014-12-24 | Disposition: A | Discharge status: Home / Self Care | Payer: Medicare Other | Source: Ambulatory Visit | Attending: Radiation Oncology | Admitting: Radiation Oncology

## 2014-12-24 DIAGNOSIS — C50412 Malignant neoplasm of upper-outer quadrant of left female breast: Secondary | ICD-10-CM | POA: Insufficient documentation

## 2014-12-24 DIAGNOSIS — I7090 Unspecified atherosclerosis: Secondary | ICD-10-CM | POA: Insufficient documentation

## 2014-12-24 DIAGNOSIS — R109 Unspecified abdominal pain: Secondary | ICD-10-CM | POA: Insufficient documentation

## 2014-12-24 DIAGNOSIS — C50411 Malignant neoplasm of upper-outer quadrant of right female breast: Secondary | ICD-10-CM | POA: Diagnosis not present

## 2014-12-24 DIAGNOSIS — Z9049 Acquired absence of other specified parts of digestive tract: Secondary | ICD-10-CM | POA: Diagnosis not present

## 2014-12-24 DIAGNOSIS — N281 Cyst of kidney, acquired: Secondary | ICD-10-CM | POA: Insufficient documentation

## 2014-12-24 DIAGNOSIS — C50919 Malignant neoplasm of unspecified site of unspecified female breast: Secondary | ICD-10-CM | POA: Diagnosis not present

## 2014-12-24 DIAGNOSIS — Z9012 Acquired absence of left breast and nipple: Secondary | ICD-10-CM | POA: Diagnosis not present

## 2014-12-24 DIAGNOSIS — Z510 Encounter for antineoplastic radiation therapy: Secondary | ICD-10-CM | POA: Diagnosis not present

## 2014-12-24 DIAGNOSIS — L599 Disorder of the skin and subcutaneous tissue related to radiation, unspecified: Secondary | ICD-10-CM | POA: Diagnosis not present

## 2014-12-24 DIAGNOSIS — I700 Atherosclerosis of aorta: Secondary | ICD-10-CM | POA: Diagnosis not present

## 2014-12-24 LAB — COMPREHENSIVE METABOLIC PANEL (CC13)
ALK PHOS: 107 U/L (ref 40–150)
ALT: 18 U/L (ref 0–55)
ANION GAP: 8 meq/L (ref 3–11)
AST: 19 U/L (ref 5–34)
Albumin: 3.6 g/dL (ref 3.5–5.0)
BILIRUBIN TOTAL: 0.62 mg/dL (ref 0.20–1.20)
BUN: 14.7 mg/dL (ref 7.0–26.0)
CHLORIDE: 106 meq/L (ref 98–109)
CO2: 24 meq/L (ref 22–29)
CREATININE: 0.8 mg/dL (ref 0.6–1.1)
Calcium: 9.1 mg/dL (ref 8.4–10.4)
EGFR: 73 mL/min/{1.73_m2} — AB (ref >90)
Glucose: 99 mg/dl (ref 70–140)
POTASSIUM: 4.5 meq/L (ref 3.5–5.1)
SODIUM: 139 meq/L (ref 136–145)
TOTAL PROTEIN: 6.5 g/dL (ref 6.4–8.3)

## 2014-12-24 LAB — CBC WITH DIFFERENTIAL/PLATELET
BASO%: 0.4 % (ref 0.0–2.0)
Basophils Absolute: 0 10*3/uL (ref 0.0–0.1)
EOS ABS: 0.2 10*3/uL (ref 0.0–0.5)
EOS%: 4 % (ref 0.0–7.0)
HCT: 38 % (ref 34.8–46.6)
HGB: 12.3 g/dL (ref 11.6–15.9)
LYMPH#: 1.1 10*3/uL (ref 0.9–3.3)
LYMPH%: 23.6 % (ref 14.0–49.7)
MCH: 29.9 pg (ref 25.1–34.0)
MCHC: 32.4 g/dL (ref 31.5–36.0)
MCV: 92.2 fL (ref 79.5–101.0)
MONO#: 0.3 10*3/uL (ref 0.1–0.9)
MONO%: 7.3 % (ref 0.0–14.0)
NEUT#: 2.9 10*3/uL (ref 1.5–6.5)
NEUT%: 64.7 % (ref 38.4–76.8)
Platelets: 176 10*3/uL (ref 145–400)
RBC: 4.12 10*6/uL (ref 3.70–5.45)
RDW: 13.7 % (ref 11.2–14.5)
WBC: 4.5 10*3/uL (ref 3.9–10.3)

## 2014-12-24 MED ORDER — IOHEXOL 300 MG/ML  SOLN
100.0000 mL | Freq: Once | INTRAMUSCULAR | Status: AC | PRN
  Administered 2014-12-24: 100 mL via INTRAVENOUS

## 2014-12-27 ENCOUNTER — Ambulatory Visit
Admission: RE | Admit: 2014-12-27 | Discharge: 2014-12-27 | Disposition: A | Discharge status: Home / Self Care | Payer: Medicare Other | Source: Ambulatory Visit | Attending: Radiation Oncology | Admitting: Radiation Oncology

## 2014-12-27 ENCOUNTER — Telehealth: Payer: Self-pay

## 2014-12-27 ENCOUNTER — Telehealth: Payer: Self-pay | Admitting: Oncology

## 2014-12-27 DIAGNOSIS — Z9012 Acquired absence of left breast and nipple: Secondary | ICD-10-CM | POA: Diagnosis not present

## 2014-12-27 DIAGNOSIS — C50412 Malignant neoplasm of upper-outer quadrant of left female breast: Secondary | ICD-10-CM | POA: Diagnosis not present

## 2014-12-27 DIAGNOSIS — C50411 Malignant neoplasm of upper-outer quadrant of right female breast: Secondary | ICD-10-CM | POA: Diagnosis not present

## 2014-12-27 DIAGNOSIS — L599 Disorder of the skin and subcutaneous tissue related to radiation, unspecified: Secondary | ICD-10-CM | POA: Diagnosis not present

## 2014-12-27 DIAGNOSIS — Z510 Encounter for antineoplastic radiation therapy: Secondary | ICD-10-CM | POA: Diagnosis not present

## 2014-12-27 NOTE — Telephone Encounter (Signed)
-----   Message from Colon Branch, MD sent at 12/27/2014  4:43 PM EST ----- Regarding: Send a letter Mrs. Lori Joseph,  your thyroid biopsy shows some abnormal cells, please call the office to discuss results at your earliest convenience

## 2014-12-27 NOTE — Telephone Encounter (Signed)
Letter printed and mailed to Pt.  

## 2014-12-27 NOTE — Telephone Encounter (Signed)
Called Lori Joseph and notified her of the good results on her CT Abd/pelvis per Dr. Sondra Come.  Judieth verbalized understanding.

## 2014-12-28 ENCOUNTER — Ambulatory Visit
Admission: RE | Admit: 2014-12-28 | Discharge: 2014-12-28 | Disposition: A | Discharge status: Home / Self Care | Payer: Medicare Other | Source: Ambulatory Visit | Attending: Radiation Oncology | Admitting: Radiation Oncology

## 2014-12-28 ENCOUNTER — Telehealth: Payer: Self-pay | Admitting: Internal Medicine

## 2014-12-28 ENCOUNTER — Encounter: Payer: Self-pay | Admitting: Radiation Oncology

## 2014-12-28 ENCOUNTER — Ambulatory Visit: Payer: Medicare Other

## 2014-12-28 VITALS — BP 157/71 | HR 67 | Temp 98.1°F | Resp 16 | Ht 63.0 in | Wt 218.1 lb

## 2014-12-28 DIAGNOSIS — L599 Disorder of the skin and subcutaneous tissue related to radiation, unspecified: Secondary | ICD-10-CM | POA: Diagnosis not present

## 2014-12-28 DIAGNOSIS — C50412 Malignant neoplasm of upper-outer quadrant of left female breast: Secondary | ICD-10-CM

## 2014-12-28 DIAGNOSIS — Z510 Encounter for antineoplastic radiation therapy: Secondary | ICD-10-CM | POA: Diagnosis not present

## 2014-12-28 DIAGNOSIS — C50411 Malignant neoplasm of upper-outer quadrant of right female breast: Secondary | ICD-10-CM | POA: Diagnosis not present

## 2014-12-28 DIAGNOSIS — Z9012 Acquired absence of left breast and nipple: Secondary | ICD-10-CM | POA: Diagnosis not present

## 2014-12-28 MED ORDER — BIAFINE EX EMUL
Freq: Once | CUTANEOUS | Status: AC
  Administered 2014-12-28: 14:00:00 via TOPICAL

## 2014-12-28 NOTE — Progress Notes (Signed)
  Radiation Oncology         [phone] ________________________________  Name: Lori Joseph MRN: [id number]  Date: 12/28/2014  DOB: 04-13-1941  Weekly Radiation Therapy Management   Breast cancer of upper-outer quadrant of left female breast     Current Dose: 58.4 Gy     Planned Dose:  62.4 Gy  Narrative . . . . . . . . The patient presents for routine under treatment assessment.                                   The patient continues to have pain in the right lower lateral rib cage/flank area. She did undergo CT scan of the abdomen and pelvis. No unusual findings within the lower chest abdomen or pelvis were noted. Patient will consult with her primary care physician Dr. Larose Kells if she continues to have pain in this area. She is taking only 2 extra strength Tylenol per day but rates his pain as an 8 out of 10                                 Set-up films were reviewed.                                 The chart was checked. Physical Findings. . .  height is 5\' 3"  (1.6 m) and weight is 218 lb 1.6 oz (98.93 kg). Her oral temperature is 98.1 F (36.7 C). Her blood pressure is 157/71 and her pulse is 67. Her respiration is 16. . The left breast area shows some edema and erythema/hyperpigmentation changes. No skin breakdown. She admits to tenderness with palpation along the right lateral lower rib cage border Impression . . . . . . . The patient is tolerating radiation. Plan . . . . . . . . . . . . Continue treatment as planned.  ________________________________   Blair Promise, PhD, MD

## 2014-12-28 NOTE — Telephone Encounter (Signed)
Caller name: Icesis, Renn Relation to pt: self  Call back number: best # [phone]   Reason for call: pt inquiring about lab results

## 2014-12-28 NOTE — Telephone Encounter (Signed)
Please advise 

## 2014-12-28 NOTE — Telephone Encounter (Signed)
Thyroid bx results reviewed and discussed with the patient. Also, is having R flank pain, CT was done,we'll see her oncologist today. Recommend to call me if I need to get involved in the evaluation of the pain.

## 2014-12-28 NOTE — Progress Notes (Signed)
Lori Joseph has completed 32 fractions to her left breast.  She continues to have pain in her right side and is rating it at 8/10.  She is taking 2 extra strength tylenol per day.  She denies fatigue.  The skin on her left breast is red.  She is using biafine cream.  She has a requested a refill and another tube has been given.  She has been given a one month follow up card.

## 2014-12-29 ENCOUNTER — Ambulatory Visit
Admission: RE | Admit: 2014-12-29 | Discharge: 2014-12-29 | Disposition: A | Discharge status: Home / Self Care | Payer: Medicare Other | Source: Ambulatory Visit | Attending: Radiation Oncology | Admitting: Radiation Oncology

## 2014-12-29 DIAGNOSIS — C50412 Malignant neoplasm of upper-outer quadrant of left female breast: Secondary | ICD-10-CM | POA: Diagnosis not present

## 2014-12-29 DIAGNOSIS — C50411 Malignant neoplasm of upper-outer quadrant of right female breast: Secondary | ICD-10-CM | POA: Diagnosis not present

## 2014-12-29 DIAGNOSIS — L599 Disorder of the skin and subcutaneous tissue related to radiation, unspecified: Secondary | ICD-10-CM | POA: Diagnosis not present

## 2014-12-29 DIAGNOSIS — Z510 Encounter for antineoplastic radiation therapy: Secondary | ICD-10-CM | POA: Diagnosis not present

## 2014-12-29 DIAGNOSIS — Z9012 Acquired absence of left breast and nipple: Secondary | ICD-10-CM | POA: Diagnosis not present

## 2014-12-30 ENCOUNTER — Ambulatory Visit
Admission: RE | Admit: 2014-12-30 | Discharge: 2014-12-30 | Disposition: A | Discharge status: Home / Self Care | Payer: Medicare Other | Source: Ambulatory Visit | Attending: Radiation Oncology | Admitting: Radiation Oncology

## 2014-12-30 DIAGNOSIS — C50412 Malignant neoplasm of upper-outer quadrant of left female breast: Secondary | ICD-10-CM | POA: Diagnosis not present

## 2014-12-30 DIAGNOSIS — C50411 Malignant neoplasm of upper-outer quadrant of right female breast: Secondary | ICD-10-CM | POA: Diagnosis not present

## 2014-12-30 DIAGNOSIS — L599 Disorder of the skin and subcutaneous tissue related to radiation, unspecified: Secondary | ICD-10-CM | POA: Diagnosis not present

## 2014-12-30 DIAGNOSIS — Z510 Encounter for antineoplastic radiation therapy: Secondary | ICD-10-CM | POA: Diagnosis not present

## 2014-12-30 DIAGNOSIS — Z9012 Acquired absence of left breast and nipple: Secondary | ICD-10-CM | POA: Diagnosis not present

## 2014-12-31 ENCOUNTER — Ambulatory Visit (INDEPENDENT_AMBULATORY_CARE_PROVIDER_SITE_OTHER): Payer: Medicare Other | Admitting: Internal Medicine

## 2014-12-31 ENCOUNTER — Encounter: Payer: Self-pay | Admitting: Internal Medicine

## 2014-12-31 ENCOUNTER — Ambulatory Visit (HOSPITAL_BASED_OUTPATIENT_CLINIC_OR_DEPARTMENT_OTHER)
Admission: RE | Admit: 2014-12-31 | Discharge: 2014-12-31 | Disposition: A | Discharge status: Home / Self Care | Payer: Medicare Other | Source: Ambulatory Visit | Attending: Internal Medicine | Admitting: Internal Medicine

## 2014-12-31 VITALS — BP 142/75 | HR 75 | Temp 98.1°F | Ht 63.0 in | Wt 219.2 lb

## 2014-12-31 DIAGNOSIS — D34 Benign neoplasm of thyroid gland: Secondary | ICD-10-CM

## 2014-12-31 DIAGNOSIS — R0781 Pleurodynia: Secondary | ICD-10-CM | POA: Diagnosis not present

## 2014-12-31 DIAGNOSIS — R0789 Other chest pain: Secondary | ICD-10-CM | POA: Diagnosis not present

## 2014-12-31 DIAGNOSIS — H40003 Preglaucoma, unspecified, bilateral: Secondary | ICD-10-CM | POA: Diagnosis not present

## 2014-12-31 DIAGNOSIS — R079 Chest pain, unspecified: Secondary | ICD-10-CM | POA: Insufficient documentation

## 2014-12-31 NOTE — Progress Notes (Signed)
Pre visit review using our clinic review tool, if applicable. No additional management support is needed unless otherwise documented below in the visit note. 

## 2014-12-31 NOTE — Assessment & Plan Note (Addendum)
Recent biopsy showed Hurthle cell changes, will see surgeon soon for breast cancer follow-up and plans to discuss the thyroid issue with her surgeon

## 2014-12-31 NOTE — Progress Notes (Signed)
Subjective:    Patient ID: Lori Joseph, female    DOB: 1941/03/02, 74 y.o.   MRN: [id number]  DOS:  12/31/2014 Type of visit - description : Acute visit Interval history: About 2 years history of on and off pain on the right side, pain is worse with certain positions or when she tries to stand up from bed. Does not recall the injury, rash. Recently talk to her oncologist about it, CT of the abdomen and pelvis was negative.   Review of Systems No fever chills No nausea vomiting No dysuria or gross hematuria  Past Medical History  Diagnosis Date  . HYPERTENSION   . OSTEOPENIA   . HYPERLIPIDEMIA   . Allergic rhinitis   . H/O retinal detachment   . H/O acute pancreatitis   . Family history of anesthesia complication     daughter hard to wake up  . Cancer     br    Past Surgical History  Procedure Laterality Date  . Tubal ligation    . Laser eye surgery, detached retina Left   . Breast biopsy Left 08/27/14  . Cholecystectomy  2000  . Tonsillectomy    . Dilation and curettage of uterus    . Colonoscopy    . Radioactive seed guided mastectomy with axillary sentinel lymph node biopsy Left 09/16/2014    Procedure: RADIOACTIVE SEED GUIDED PARTIAL MASTECTOMY WITH AXILLARY SENTINEL LYMPH NODE BIOPSY;  Surgeon: [name], MD;  Location: [facility];  Service: General;  Laterality: Left;    History   Social History  . Marital Status: Married    Spouse Name: Ruthann Cancer    Number of Children: 2  . 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 Smoking status: Never Smoker   . Smokeless tobacco: Not on file  . Alcohol Use: No     Comment: rarely wine  . Drug Use: No  . Sexual Activity: Not on file   Other Topics Concern  . Not on file   Social History Narrative   Lives w/ husband        Medication List       This list is accurate as of: 12/31/14 11:59 PM.  Always use your most  recent med list.               acetaminophen 500 MG tablet  Commonly known as:  TYLENOL  Take 500 mg by mouth every 6 (six) hours as needed.     anastrozole 1 MG tablet  Commonly known as:  ARIMIDEX  Take 1 tablet (1 mg total) by mouth daily.     aspirin 81 MG EC tablet  Take 81 mg by mouth daily. Swallow whole.     calcium carbonate 1250 MG capsule  Take 1,250 mg by mouth daily.     cholecalciferol 1000 UNITS tablet  Commonly known as:  VITAMIN D  Take 1,000 Units by mouth daily. 2000     dorzolamide-timolol 22.3-6.8 MG/ML ophthalmic solution  Commonly known as:  COSOPT  Place 1 drop into both eyes daily.     emollient cream  Commonly known as:  BIAFINE  Apply topically as needed.     fluticasone 0.005 % ointment  Commonly known as:  CUTIVATE  Apply 1 application topically 3 (three) times daily as needed.     fluticasone 50 MCG/ACT nasal spray  Commonly known as:  FLONASE  Place 2 sprays into the nose daily as needed.     hyaluronate sodium Gel  Apply 1 application topically once.     latanoprost 0.005 % ophthalmic solution  Commonly known as:  XALATAN  Place 1 drop into both eyes at bedtime.     multivitamin tablet  Take 1 tablet by mouth daily.     olmesartan 40 MG tablet  Commonly known as:  BENICAR  TAKE 1 TABLET (40 MG TOTAL) BY MOUTH DAILY.     pravastatin 40 MG tablet  Commonly known as:  PRAVACHOL  Take 1 tablet (40 mg total) by mouth daily.           Objective:   Physical Exam  Constitutional: She is oriented to person, place, and time. She appears well-developed. No distress.  HENT:  Head: Normocephalic and atraumatic.  Cardiovascular:     Pulmonary/Chest:     Abdominal: Soft. Bowel sounds are normal. She exhibits no distension and no mass. There is no tenderness. There is no rebound and no guarding.  No organomegaly  Musculoskeletal: She exhibits no edema or tenderness.       Arms: Neurological: She is alert and oriented to  person, place, and time. No cranial nerve deficit. She exhibits normal muscle tone. Coordination normal.  Speech normal, gait unassisted and normal for age, motor strength appropriate for age   Skin: Skin is warm and dry. No pallor.  No jaundice  Psychiatric: She has a normal mood and affect. Her behavior is normal. Judgment and thought content normal.  Vitals reviewed.        Assessment & Plan:   Problem List Items Addressed This Visit      Endocrine   Hurthle cell neoplasm of thyroid    Recent biopsy showed Hurthle cell changes, we'll see surgery   soon for breast cancer follow-up and plans to discuss the thyroid issue with her surgeon        Other   Chest wall pain - Primary    2 years history of on and off pain at the right lateral chest, area is  TTP, recent CT of the abdomen and pelvis show no abnormalities. Likely a musculoskeletal issue, patient states she needs some relief. Plan: Refer to pain management, local injection?hurthel       Relevant Orders   DG Ribs Unilateral Right (Completed)

## 2014-12-31 NOTE — Patient Instructions (Signed)
Will refer you to pain managment

## 2014-12-31 NOTE — Assessment & Plan Note (Signed)
2 years history of on and off pain at the right lateral chest, area is  TTP, recent CT of the abdomen and pelvis show no abnormalities. Likely a musculoskeletal issue, patient states she needs some relief. Plan: Refer to pain management, local injection?hurthel

## 2015-01-07 ENCOUNTER — Other Ambulatory Visit (INDEPENDENT_AMBULATORY_CARE_PROVIDER_SITE_OTHER): Payer: Self-pay | Admitting: General Surgery

## 2015-01-07 DIAGNOSIS — D34 Benign neoplasm of thyroid gland: Secondary | ICD-10-CM | POA: Diagnosis not present

## 2015-01-11 ENCOUNTER — Encounter: Payer: Self-pay | Admitting: Radiation Oncology

## 2015-01-11 NOTE — Progress Notes (Signed)
  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   She had minimal itching/discomfort within the left breast and fatigue. No skin breakdown at the completion of treatment  Plan: The patient has completed radiation treatment. The patient will return to radiation oncology clinic for routine followup in one month. I advised them to call or return sooner if they have any questions or concerns related to their recovery or treatment.  -----------------------------------  Blair Promise, PhD, MD

## 2015-01-13 ENCOUNTER — Telehealth: Payer: Self-pay | Admitting: Internal Medicine

## 2015-01-13 DIAGNOSIS — R0789 Other chest pain: Secondary | ICD-10-CM

## 2015-01-13 NOTE — Telephone Encounter (Signed)
Caller name: Tatisha Relation to pt: self Call back number: [phone] Pharmacy:  Reason for call:   Patient states that she does not want referral to orthopaedic. She would rather see pain mgmt

## 2015-01-13 NOTE — Telephone Encounter (Signed)
FYI. Please advise.

## 2015-01-13 NOTE — Telephone Encounter (Signed)
Ok to change to pain managment

## 2015-01-13 NOTE — Telephone Encounter (Signed)
Referral placed to pain management

## 2015-01-14 ENCOUNTER — Telehealth: Payer: Self-pay

## 2015-01-14 ENCOUNTER — Telehealth: Payer: Self-pay | Admitting: Hematology and Oncology

## 2015-01-14 NOTE — Telephone Encounter (Signed)
Spoke with patient to confirm anastrazole start.  Pt inform radonc advised her to wait a few weeks prior to starting.  Let pt know this was not a problem but we would need to move her appt.  Pt reports she will start Anastrazole 3/1.  Advised pt she should be hearing from scheduling with the next week for appt in the first week of April.  Pt voiced understanding.    pof sent.  3/1 appt cancelled

## 2015-01-14 NOTE — Telephone Encounter (Signed)
, °

## 2015-01-25 ENCOUNTER — Ambulatory Visit: Payer: Medicare Other | Admitting: Hematology and Oncology

## 2015-01-31 NOTE — Progress Notes (Deleted)
Please put orders in Epic surgery 02-10-15 pre op 02-08-15 Thanks

## 2015-02-01 ENCOUNTER — Other Ambulatory Visit (INDEPENDENT_AMBULATORY_CARE_PROVIDER_SITE_OTHER): Payer: Self-pay | Admitting: General Surgery

## 2015-02-01 NOTE — Patient Instructions (Addendum)
Lori Joseph  02/01/2015   Your procedure is scheduled on: 02/10/2015    Report to [facility] Main  Entrance and follow signs to               [place] at     [place].  Call this number if you have problems the morning of surgery [phone]   Remember:  Do not eat food or drink liquids :After Midnight.     Take these medicines the morning of surgery with A SIP OF WATER: Flonase nasal spray if needed, use eye drops                               You may not have any metal on your body including hair pins and              piercings  Do not wear jewelry, make-up, lotions, powders or perfumes., deodorant.               Do not wear nail polish.  Do not shave  48 hours prior to surgery.              Do not bring valuables to the hospital. [place].  Contacts, dentures or bridgework may not be worn into surgery.  Leave suitcase in the car. After surgery it may be brought to your room.         Special Instructions: coughing and deep breathing exercises, leg exercises               Please read over the following fact sheets you were given: _____________________________________________________________________             [facility] - Preparing for Surgery Before surgery, you can play an important role.  Because skin is not sterile, your skin needs to be as free of germs as possible.  You can reduce the number of germs on your skin by washing with CHG (chlorahexidine gluconate) soap before surgery.  CHG is an antiseptic cleaner which kills germs and bonds with the skin to continue killing germs even after washing. Please DO NOT use if you have an allergy to CHG or antibacterial soaps.  If your skin becomes reddened/irritated stop using the CHG and inform your nurse when you arrive at Short Stay. Do not shave (including legs and underarms) for at least 48 hours prior to the first CHG shower.  You may shave  your face/neck. Please follow these instructions carefully:  1.  Shower with CHG Soap the night before surgery and the  morning of Surgery.  2.  If you choose to wash your hair, wash your hair first as usual with your  normal  shampoo.  3.  After you shampoo, rinse your hair and body thoroughly to remove the  shampoo.                           4.  Use CHG as you would any other liquid soap.  You can apply chg directly  to the skin and wash                       Gently with a scrungie or  clean washcloth.  5.  Apply the CHG Soap to your body ONLY FROM THE NECK DOWN.   Do not use on face/ open                           Wound or open sores. Avoid contact with eyes, ears mouth and genitals (private parts).                       Wash face,  Genitals (private parts) with your normal soap.             6.  Wash thoroughly, paying special attention to the area where your surgery  will be performed.  7.  Thoroughly rinse your body with warm water from the neck down.  8.  DO NOT shower/wash with your normal soap after using and rinsing off  the CHG Soap.                9.  Pat yourself dry with a clean towel.            10.  Wear clean pajamas.            11.  Place clean sheets on your bed the night of your first shower and do not  sleep with pets. Day of Surgery : Do not apply any lotions/deodorants the morning of surgery.  Please wear clean clothes to the hospital/surgery center.  FAILURE TO FOLLOW THESE INSTRUCTIONS MAY RESULT IN THE CANCELLATION OF YOUR SURGERY PATIENT SIGNATURE_________________________________  NURSE SIGNATURE__________________________________  ________________________________________________________________________

## 2015-02-01 NOTE — Progress Notes (Signed)
Please put orders in Epic surgery 02-10-15 pre op 02-02-15 Thanks

## 2015-02-02 ENCOUNTER — Encounter (HOSPITAL_COMMUNITY): Payer: Self-pay

## 2015-02-02 ENCOUNTER — Encounter (HOSPITAL_COMMUNITY)
Admission: RE | Admit: 2015-02-02 | Discharge: 2015-02-02 | Disposition: A | Discharge status: Home / Self Care | Payer: Medicare Other | Source: Ambulatory Visit | Attending: General Surgery | Admitting: General Surgery

## 2015-02-02 ENCOUNTER — Encounter (INDEPENDENT_AMBULATORY_CARE_PROVIDER_SITE_OTHER): Payer: Self-pay

## 2015-02-02 DIAGNOSIS — Z01810 Encounter for preprocedural cardiovascular examination: Secondary | ICD-10-CM | POA: Insufficient documentation

## 2015-02-02 DIAGNOSIS — E041 Nontoxic single thyroid nodule: Secondary | ICD-10-CM | POA: Insufficient documentation

## 2015-02-02 DIAGNOSIS — Z01812 Encounter for preprocedural laboratory examination: Secondary | ICD-10-CM | POA: Diagnosis not present

## 2015-02-02 LAB — URINALYSIS, ROUTINE W REFLEX MICROSCOPIC
BILIRUBIN URINE: NEGATIVE
Glucose, UA: NEGATIVE mg/dL
HGB URINE DIPSTICK: NEGATIVE
Ketones, ur: NEGATIVE mg/dL
Nitrite: NEGATIVE
PH: 6 (ref 5.0–8.0)
PROTEIN: NEGATIVE mg/dL
SPECIFIC GRAVITY, URINE: 1.019 (ref 1.005–1.030)
UROBILINOGEN UA: 0.2 mg/dL (ref 0.0–1.0)

## 2015-02-02 LAB — BASIC METABOLIC PANEL
Anion gap: 6 (ref 5–15)
BUN: 24 mg/dL — AB (ref 6–23)
CO2: 27 mmol/L (ref 19–32)
Calcium: 9.1 mg/dL (ref 8.4–10.5)
Chloride: 104 mmol/L (ref 96–112)
Creatinine, Ser: 0.84 mg/dL (ref 0.50–1.10)
GFR, EST AFRICAN AMERICAN: 78 mL/min — AB (ref >90)
GFR, EST NON AFRICAN AMERICAN: 67 mL/min — AB (ref >90)
Glucose, Bld: 95 mg/dL (ref 70–99)
Potassium: 4.9 mmol/L (ref 3.5–5.1)
Sodium: 137 mmol/L (ref 135–145)

## 2015-02-02 LAB — CBC
HCT: 36.3 % (ref 36.0–46.0)
Hemoglobin: 11.7 g/dL — ABNORMAL LOW (ref 12.0–15.0)
MCH: 30.2 pg (ref 26.0–34.0)
MCHC: 32.2 g/dL (ref 30.0–36.0)
MCV: 93.6 fL (ref 78.0–100.0)
Platelets: 195 10*3/uL (ref 150–400)
RBC: 3.88 MIL/uL (ref 3.87–5.11)
RDW: 13.8 % (ref 11.5–15.5)
WBC: 4.3 10*3/uL (ref 4.0–10.5)

## 2015-02-02 LAB — URINE MICROSCOPIC-ADD ON

## 2015-02-10 ENCOUNTER — Encounter (HOSPITAL_COMMUNITY): Payer: Self-pay

## 2015-02-10 ENCOUNTER — Encounter (HOSPITAL_COMMUNITY)
Admission: RE | Disposition: A | Discharge status: Home / Self Care | Payer: Self-pay | Source: Ambulatory Visit | Attending: General Surgery

## 2015-02-10 ENCOUNTER — Ambulatory Visit (HOSPITAL_COMMUNITY)
Admission: RE | Admit: 2015-02-10 | Discharge: 2015-02-11 | Disposition: A | Discharge status: Home / Self Care | Payer: Medicare Other | Source: Ambulatory Visit | Attending: General Surgery | Admitting: General Surgery

## 2015-02-10 ENCOUNTER — Ambulatory Visit (HOSPITAL_COMMUNITY): Payer: Medicare Other | Admitting: Certified Registered"

## 2015-02-10 DIAGNOSIS — Z853 Personal history of malignant neoplasm of breast: Secondary | ICD-10-CM | POA: Diagnosis not present

## 2015-02-10 DIAGNOSIS — Z888 Allergy status to other drugs, medicaments and biological substances status: Secondary | ICD-10-CM | POA: Insufficient documentation

## 2015-02-10 DIAGNOSIS — D34 Benign neoplasm of thyroid gland: Secondary | ICD-10-CM | POA: Diagnosis not present

## 2015-02-10 DIAGNOSIS — Z803 Family history of malignant neoplasm of breast: Secondary | ICD-10-CM | POA: Insufficient documentation

## 2015-02-10 DIAGNOSIS — Z7982 Long term (current) use of aspirin: Secondary | ICD-10-CM | POA: Insufficient documentation

## 2015-02-10 DIAGNOSIS — Z79899 Other long term (current) drug therapy: Secondary | ICD-10-CM | POA: Diagnosis not present

## 2015-02-10 DIAGNOSIS — E041 Nontoxic single thyroid nodule: Secondary | ICD-10-CM | POA: Diagnosis not present

## 2015-02-10 DIAGNOSIS — C73 Malignant neoplasm of thyroid gland: Secondary | ICD-10-CM

## 2015-02-10 HISTORY — DX: Malignant neoplasm of thyroid gland: C73

## 2015-02-10 HISTORY — PX: THYROID LOBECTOMY: SHX420

## 2015-02-10 SURGERY — LOBECTOMY, THYROID
Anesthesia: General

## 2015-02-10 MED ORDER — CEFAZOLIN SODIUM-DEXTROSE 2-3 GM-% IV SOLR
INTRAVENOUS | Status: AC
  Filled 2015-02-10: qty 50

## 2015-02-10 MED ORDER — CEFAZOLIN SODIUM-DEXTROSE 2-3 GM-% IV SOLR
2.0000 g | INTRAVENOUS | Status: AC
  Administered 2015-02-10: 2 g via INTRAVENOUS

## 2015-02-10 MED ORDER — ONDANSETRON HCL 4 MG PO TABS: 4.0000 mg | ORAL_TABLET | Freq: Four times a day (QID) | ORAL | Status: DC | PRN

## 2015-02-10 MED ORDER — DEXAMETHASONE SODIUM PHOSPHATE 10 MG/ML IJ SOLN
INTRAMUSCULAR | Status: DC | PRN
  Administered 2015-02-10: 10 mg via INTRAVENOUS

## 2015-02-10 MED ORDER — ONDANSETRON HCL 4 MG/2ML IJ SOLN: 4.0000 mg | Freq: Four times a day (QID) | INTRAMUSCULAR | Status: DC | PRN

## 2015-02-10 MED ORDER — DEXAMETHASONE SODIUM PHOSPHATE 10 MG/ML IJ SOLN
INTRAMUSCULAR | Status: AC
  Filled 2015-02-10: qty 1

## 2015-02-10 MED ORDER — ONDANSETRON HCL 4 MG/2ML IJ SOLN
INTRAMUSCULAR | Status: DC | PRN
  Administered 2015-02-10: 4 mg via INTRAVENOUS

## 2015-02-10 MED ORDER — EPHEDRINE SULFATE 50 MG/ML IJ SOLN
INTRAMUSCULAR | Status: DC | PRN
  Administered 2015-02-10: 10 mg via INTRAVENOUS

## 2015-02-10 MED ORDER — RADIAPLEXRX EX GEL
1.0000 "application " | Freq: Every day | CUTANEOUS | Status: DC
  Filled 2015-02-10: qty 170

## 2015-02-10 MED ORDER — 0.9 % SODIUM CHLORIDE (POUR BTL) OPTIME
TOPICAL | Status: DC | PRN
  Administered 2015-02-10: 1000 mL

## 2015-02-10 MED ORDER — IRBESARTAN 300 MG PO TABS
300.0000 mg | ORAL_TABLET | Freq: Every day | ORAL | Status: DC
  Filled 2015-02-10: qty 1

## 2015-02-10 MED ORDER — ONDANSETRON HCL 4 MG/2ML IJ SOLN
INTRAMUSCULAR | Status: AC
  Filled 2015-02-10: qty 2

## 2015-02-10 MED ORDER — PROPOFOL 10 MG/ML IV BOLUS
INTRAVENOUS | Status: AC
  Filled 2015-02-10: qty 20

## 2015-02-10 MED ORDER — SODIUM CHLORIDE 0.9 % IJ SOLN
INTRAMUSCULAR | Status: AC
  Filled 2015-02-10: qty 10

## 2015-02-10 MED ORDER — LACTATED RINGERS IV SOLN: INTRAVENOUS | Status: DC

## 2015-02-10 MED ORDER — ACETAMINOPHEN 10 MG/ML IV SOLN
1000.0000 mg | Freq: Once | INTRAVENOUS | Status: AC
  Administered 2015-02-10: 1000 mg via INTRAVENOUS
  Filled 2015-02-10: qty 100

## 2015-02-10 MED ORDER — PRAVASTATIN SODIUM 40 MG PO TABS
40.0000 mg | ORAL_TABLET | Freq: Every day | ORAL | Status: DC
  Administered 2015-02-10: 40 mg via ORAL
  Filled 2015-02-10 (×2): qty 1

## 2015-02-10 MED ORDER — FENTANYL CITRATE 0.05 MG/ML IJ SOLN
25.0000 ug | INTRAMUSCULAR | Status: DC | PRN
  Administered 2015-02-10: 50 ug via INTRAVENOUS

## 2015-02-10 MED ORDER — VITAMIN D3 25 MCG (1000 UNIT) PO TABS
1000.0000 [IU] | ORAL_TABLET | Freq: Every morning | ORAL | Status: DC
  Administered 2015-02-10 – 2015-02-11 (×2): 1000 [IU] via ORAL
  Filled 2015-02-10 (×2): qty 1

## 2015-02-10 MED ORDER — CEFAZOLIN SODIUM 1-5 GM-% IV SOLN
1.0000 g | Freq: Four times a day (QID) | INTRAVENOUS | Status: AC
  Administered 2015-02-10 – 2015-02-11 (×3): 1 g via INTRAVENOUS
  Filled 2015-02-10 (×5): qty 50

## 2015-02-10 MED ORDER — FENTANYL CITRATE 0.05 MG/ML IJ SOLN
INTRAMUSCULAR | Status: AC
  Filled 2015-02-10: qty 2

## 2015-02-10 MED ORDER — DORZOLAMIDE HCL-TIMOLOL MAL 2-0.5 % OP SOLN
1.0000 [drp] | Freq: Every morning | OPHTHALMIC | Status: DC
Start: 2015-02-11 — End: 2015-02-11
  Administered 2015-02-11: 1 [drp] via OPHTHALMIC
  Filled 2015-02-10: qty 10

## 2015-02-10 MED ORDER — LACTATED RINGERS IV SOLN
INTRAVENOUS | Status: DC | PRN
  Administered 2015-02-10: 07:00:00 via INTRAVENOUS

## 2015-02-10 MED ORDER — KETOROLAC TROMETHAMINE 15 MG/ML IJ SOLN
15.0000 mg | Freq: Four times a day (QID) | INTRAMUSCULAR | Status: DC
  Administered 2015-02-10 – 2015-02-11 (×4): 15 mg via INTRAVENOUS
  Filled 2015-02-10 (×7): qty 1

## 2015-02-10 MED ORDER — LIDOCAINE HCL 1 % IJ SOLN
INTRAMUSCULAR | Status: AC
  Filled 2015-02-10: qty 20

## 2015-02-10 MED ORDER — KETOROLAC TROMETHAMINE 15 MG/ML IJ SOLN
INTRAMUSCULAR | Status: AC
  Administered 2015-02-10: 15 mg via INTRAVENOUS
  Filled 2015-02-10: qty 1

## 2015-02-10 MED ORDER — PROMETHAZINE HCL 25 MG/ML IJ SOLN: 6.2500 mg | INTRAMUSCULAR | Status: DC | PRN

## 2015-02-10 MED ORDER — MIDAZOLAM HCL 2 MG/2ML IJ SOLN
INTRAMUSCULAR | Status: AC
  Filled 2015-02-10: qty 2

## 2015-02-10 MED ORDER — FENTANYL CITRATE 0.05 MG/ML IJ SOLN
25.0000 ug | INTRAMUSCULAR | Status: DC | PRN
  Administered 2015-02-10 (×3): 50 ug via INTRAVENOUS

## 2015-02-10 MED ORDER — ROCURONIUM BROMIDE 100 MG/10ML IV SOLN
INTRAVENOUS | Status: AC
  Filled 2015-02-10: qty 1

## 2015-02-10 MED ORDER — CALCIUM CARBONATE 1250 (500 CA) MG PO TABS
2500.0000 mg | ORAL_TABLET | Freq: Every morning | ORAL | Status: DC
  Administered 2015-02-10 – 2015-02-11 (×2): 2500 mg via ORAL
  Filled 2015-02-10 (×2): qty 2

## 2015-02-10 MED ORDER — LATANOPROST 0.005 % OP SOLN
1.0000 [drp] | Freq: Every day | OPHTHALMIC | Status: DC
  Administered 2015-02-10: 1 [drp] via OPHTHALMIC
  Filled 2015-02-10: qty 2.5

## 2015-02-10 MED ORDER — LIDOCAINE HCL (CARDIAC) 20 MG/ML IV SOLN
INTRAVENOUS | Status: AC
  Filled 2015-02-10: qty 5

## 2015-02-10 MED ORDER — OLMESARTAN MEDOXOMIL 40 MG PO TABS
40.0000 mg | ORAL_TABLET | Freq: Every day | ORAL | Status: DC
  Administered 2015-02-10 – 2015-02-11 (×2): 40 mg via ORAL
  Filled 2015-02-10 (×2): qty 1

## 2015-02-10 MED ORDER — EPHEDRINE SULFATE 50 MG/ML IJ SOLN
INTRAMUSCULAR | Status: AC
  Filled 2015-02-10: qty 1

## 2015-02-10 MED ORDER — FENTANYL CITRATE 0.05 MG/ML IJ SOLN
INTRAMUSCULAR | Status: DC | PRN
  Administered 2015-02-10: 100 ug via INTRAVENOUS

## 2015-02-10 MED ORDER — PROPOFOL 10 MG/ML IV BOLUS
INTRAVENOUS | Status: DC | PRN
  Administered 2015-02-10: 50 mg via INTRAVENOUS
  Administered 2015-02-10: 150 mg via INTRAVENOUS

## 2015-02-10 MED ORDER — MEPERIDINE HCL 50 MG/ML IJ SOLN: 6.2500 mg | INTRAMUSCULAR | Status: DC | PRN

## 2015-02-10 MED ORDER — ACETAMINOPHEN 500 MG PO TABS
500.0000 mg | ORAL_TABLET | Freq: Four times a day (QID) | ORAL | Status: DC | PRN
  Administered 2015-02-11: 500 mg via ORAL
  Filled 2015-02-10: qty 1

## 2015-02-10 MED ORDER — LIDOCAINE HCL (PF) 2 % IJ SOLN
INTRAMUSCULAR | Status: DC | PRN
  Administered 2015-02-10: 40 mg via INTRADERMAL

## 2015-02-10 MED ORDER — KCL IN DEXTROSE-NACL 20-5-0.45 MEQ/L-%-% IV SOLN
INTRAVENOUS | Status: DC
  Administered 2015-02-10 – 2015-02-11 (×2): via INTRAVENOUS
  Filled 2015-02-10 (×5): qty 1000

## 2015-02-10 MED ORDER — SUCCINYLCHOLINE CHLORIDE 20 MG/ML IJ SOLN
INTRAMUSCULAR | Status: DC | PRN
  Administered 2015-02-10: 100 mg via INTRAVENOUS

## 2015-02-10 MED ORDER — BUPIVACAINE-EPINEPHRINE (PF) 0.25% -1:200000 IJ SOLN
INTRAMUSCULAR | Status: AC
  Filled 2015-02-10: qty 30

## 2015-02-10 MED ORDER — FENTANYL CITRATE 0.05 MG/ML IJ SOLN: 25.0000 ug | INTRAMUSCULAR | Status: DC | PRN

## 2015-02-10 SURGICAL SUPPLY — 41 items
APL SKNCLS STERI-STRIP NONHPOA (GAUZE/BANDAGES/DRESSINGS)
ATTRACTOMAT 16X20 MAGNETIC DRP (DRAPES) ×3 IMPLANT
BENZOIN TINCTURE PRP APPL 2/3 (GAUZE/BANDAGES/DRESSINGS) IMPLANT
BLADE HEX COATED 2.75 (ELECTRODE) ×3 IMPLANT
BLADE SURG 15 STRL LF DISP TIS (BLADE) ×1 IMPLANT
BLADE SURG 15 STRL SS (BLADE) ×3
CHLORAPREP W/TINT 10.5 ML (MISCELLANEOUS) ×3 IMPLANT
CLIP TI MEDIUM 6 (CLIP) ×6 IMPLANT
CLIP TI WIDE RED SMALL 6 (CLIP) ×6 IMPLANT
CLOSURE WOUND 1/2 X4 (GAUZE/BANDAGES/DRESSINGS)
DISSECTOR ROUND CHERRY 3/8 STR (MISCELLANEOUS) ×3 IMPLANT
DRAPE LAPAROTOMY T 98X78 PEDS (DRAPES) ×3 IMPLANT
DRESSING SURGICEL FIBRLLR 1X2 (HEMOSTASIS) ×1 IMPLANT
DRSG SURGICEL FIBRILLAR 1X2 (HEMOSTASIS) ×3
ELECT REM PT RETURN 9FT ADLT (ELECTROSURGICAL) ×3
ELECTRODE REM PT RTRN 9FT ADLT (ELECTROSURGICAL) ×1 IMPLANT
GAUZE SPONGE 4X4 12PLY STRL (GAUZE/BANDAGES/DRESSINGS) IMPLANT
GAUZE SPONGE 4X4 16PLY XRAY LF (GAUZE/BANDAGES/DRESSINGS) ×3 IMPLANT
GLOVE BIO SURGEON STRL SZ 6 (GLOVE) ×3 IMPLANT
GLOVE INDICATOR 6.5 STRL GRN (GLOVE) ×6 IMPLANT
GOWN STRL REUS W/TWL 2XL LVL3 (GOWN DISPOSABLE) ×3 IMPLANT
GOWN STRL REUS W/TWL XL LVL3 (GOWN DISPOSABLE) ×6 IMPLANT
KIT BASIN OR (CUSTOM PROCEDURE TRAY) ×3 IMPLANT
LIQUID BAND (GAUZE/BANDAGES/DRESSINGS) ×2 IMPLANT
MARKER SKIN DUAL TIP RULER LAB (MISCELLANEOUS) ×3 IMPLANT
NS IRRIG 1000ML POUR BTL (IV SOLUTION) ×3 IMPLANT
PACK BASIC VI WITH GOWN DISP (CUSTOM PROCEDURE TRAY) ×3 IMPLANT
PENCIL BUTTON HOLSTER BLD 10FT (ELECTRODE) ×3 IMPLANT
SHEARS HARMONIC 9CM CVD (BLADE) ×3 IMPLANT
STAPLER VISISTAT 35W (STAPLE) IMPLANT
STRIP CLOSURE SKIN 1/2X4 (GAUZE/BANDAGES/DRESSINGS) IMPLANT
SUT MNCRL AB 4-0 PS2 18 (SUTURE) ×3 IMPLANT
SUT SILK 2 0 (SUTURE) ×3
SUT SILK 2-0 18XBRD TIE 12 (SUTURE) ×1 IMPLANT
SUT SILK 3 0 (SUTURE)
SUT SILK 3-0 18XBRD TIE 12 (SUTURE) IMPLANT
SUT VIC AB 3-0 SH 18 (SUTURE) ×3 IMPLANT
SUT VICRYL 3 0 (SUTURE) IMPLANT
SYR BULB IRRIGATION 50ML (SYRINGE) ×3 IMPLANT
TOWEL OR 17X26 10 PK STRL BLUE (TOWEL DISPOSABLE) ×3 IMPLANT
YANKAUER SUCT BULB TIP 10FT TU (MISCELLANEOUS) ×3 IMPLANT

## 2015-02-10 NOTE — Transfer of Care (Signed)
Immediate Anesthesia Transfer of Care Note  Patient: Lori Joseph  Procedure(s) Performed: Procedure(s) (LRB): RIGHT THYROID LOBECTOMY (N/A)  Patient Location: PACU  Anesthesia Type: General  Level of Consciousness: sedated, patient cooperative and responds to stimulation  Airway & Oxygen Therapy: Patient Spontanous Breathing and Patient connected to face mask oxgen  Post-op Assessment: Report given to PACU RN and Post -op Vital signs reviewed and stable  Post vital signs: Reviewed and stable  Complications: No apparent anesthesia complications

## 2015-02-10 NOTE — Anesthesia Preprocedure Evaluation (Signed)
Anesthesia Evaluation  Patient identified by MRN, date of birth, ID band Patient awake    Reviewed: Allergy & Precautions, H&P , NPO status , Patient's Chart, lab work & pertinent test results  History of Anesthesia Complications (+) PROLONGED EMERGENCE and history of anesthetic complications  Airway Mallampati: II  TM Distance: >3 FB Neck ROM: Full    Dental no notable dental hx. (+) Caps, Dental Advisory Given   Pulmonary neg pulmonary ROS,    Pulmonary exam normal       Cardiovascular hypertension, Pt. on medications     Neuro/Psych negative neurological ROS  negative psych ROS   GI/Hepatic negative GI ROS, Neg liver ROS,   Endo/Other  Morbid obesity  Renal/GU negative Renal ROS  negative genitourinary   Musculoskeletal   Abdominal   Peds  Hematology negative hematology ROS (+)   Anesthesia Other Findings   Reproductive/Obstetrics negative OB ROS                             Anesthesia Physical  Anesthesia Plan  ASA: III  Anesthesia Plan: General   Post-op Pain Management:    Induction: Intravenous  Airway Management Planned: Oral ETT  Additional Equipment:   Intra-op Plan:   Post-operative Plan: Extubation in OR  Informed Consent: I have reviewed the patients History and Physical, chart, labs and discussed the procedure including the risks, benefits and alternatives for the proposed anesthesia with the patient or authorized representative who has indicated his/her understanding and acceptance.   Dental advisory given  Plan Discussed with: CRNA  Anesthesia Plan Comments:         Anesthesia Quick Evaluation

## 2015-02-10 NOTE — Interval H&P Note (Signed)
History and Physical Interval Note:  02/10/2015 7:29 AM  Lori Joseph  has presented today for surgery, with the diagnosis of RIGHT THYROID NODULE  The various methods of treatment have been discussed with the patient and family. After consideration of risks, benefits and other options for treatment, the patient has consented to  Procedure(s): RIGHT THYROID LOBECTOMY VS TOTAL THYROIDECTOMY (N/A) as a surgical intervention .  The patient's history has been reviewed, patient examined, no change in status, stable for surgery.  I have reviewed the patient's chart and labs.  Questions were answered to the patient's satisfaction.     Ammanda Dobbins

## 2015-02-10 NOTE — H&P (Signed)
Lori Joseph  Location: [facility] Surgery Patient #: [id number] DOB: 18-May-1941 Married / Language: English / Race: White Female  History of Present Illness \\Patient  words: po lap chole.  The patient is a 74 year old female who presents with a thyroid nodule. Patient is ona prior breast cancer breast cancer patients who was found to have a thyroid nodule on physical exam. The underwent ultrasound which showed a multinodular thyroid and a dominent solid right nodule on the right mid thyroid. U/S guided biopsy was performed, and this demonstrated a hurthle cell nodule. She is having some compressive symptoms and hoarseness, but these symptoms are mild. Of note, her sister had breast cancer, melanoma, and thyroid "issues." She is not on thyroid medication.    Allergies Carvedilol *BETA BLOCKERS* Codeine Phosphate *ANALGESICS - OPIOID* Felodipine *CALCIUM CHANNEL BLOCKERS* Lisinopril *ANTIHYPERTENSIVES* Losartan Potassium *ANTIHYPERTENSIVES*  Medication History ValACYclovir HCl (1GM Tablet, Oral) Active. Fluticasone Propionate (0.005% Ointment, External) Active. Dorzolamide HCl-Timolol Mal (22.3-6.8MG /ML Solution, Ophthalmic) Active. ALPRAZolam (0.25MG  Tablet Disperse, Oral) Active. Hydrocodone-Acetaminophen (5-325MG  Tablet, Oral) Active. Aspirin Child (81MG  Tablet Chewable, Oral) Active. Anastrozole (1MG  Tablet, Oral) Active. Hyaluronate Sodium Active. Latanoprost (Ophthalmic) Active. Benicar (40MG  Tablet, Oral) Active. Pravastatin Sodium (40MG  Tablet, Oral) Active. Fluticasone Propionate (0.05% Lotion, External) Active. Tylenol Flu (30-500-15MG  Tablet, Oral) Active.  Review of Systems All other systems negative   Vitals 9:52 AM Weight: 218 lb Height: 63in Body Surface Area: 2.1 m Body Mass Index: 38.62 kg/m Temp.: 97.11F  Pulse: 60 (Regular)  BP: 130/70 (Sitting, Left Arm, Standard)    Physical Exam [name] MD; 01/28/2015  4:11 PM) General Mental Status-Alert. General Appearance-Consistent with stated age. Hydration-Well hydrated. Voice-Normal.  Head and Neck Head -Note: palpable right thyroid mass.   Eye Sclera/Conjunctiva - Bilateral-No scleral icterus.  Chest and Lung Exam Chest and lung exam reveals -quiet, even and easy respiratory effort with no use of accessory muscles. Inspection Chest Wall - Normal. Back - normal.  Breast - Did not examine.  Cardiovascular Cardiovascular examination reveals -normal pedal pulses bilaterally. Note: regular rate and rhythm  Abdomen Inspection-Inspection Normal. Palpation/Percussion Palpation and Percussion of the abdomen reveal - Soft, Non Tender, No Rebound tenderness, No Rigidity (guarding) and No hepatosplenomegaly.  Peripheral Vascular Upper Extremity Inspection - Bilateral - Normal - No Clubbing, No Cyanosis, No Edema, Pulses Intact. Lower Extremity Palpation - Edema - Bilateral - No edema.  Neurologic Neurologic evaluation reveals -alert and oriented x 3 with no impairment of recent or remote memory. Mental Status-Normal.  Musculoskeletal Global Assessment -Note: no gross deformities.  Normal Exam - Left-Upper Extremity Strength Normal and Lower Extremity Strength Normal. Normal Exam - Right-Upper Extremity Strength Normal and Lower Extremity Strength Normal.  Lymphatic Head & Neck  General Head & Neck Lymphatics: Bilateral - Description - Normal. Axillary  General Axillary Region: Bilateral - Description - Normal. Tenderness - Non Tender.    Assessment & Plan HURTHLE CELL NEOPLASM OF THYROID ([id number]  [id number]) Impression: We will plan total thyroidectomy. She has a nodule that requires excision as it is a hurthle cell nodule. The remainder of the thyroid has many nodules that would have to be followed.  I reviewed anatomical information and educational material about the thyroid with the patient. We  discussed risks and benefits of surgery. I discussed bleeding, infection, damage to adjacent structures, possible need for additional surgeries or procedures, hoarseness, heart or lung problems, possible diagnosis of cancer.  30 min spent in evaluation, examination, counseling, and coordination of care. >50% spent in  counseling. Current Plans  Pt Education - Thyroidectomy: thyroidectomy Schedule for Surgery   Signed by [name], MD

## 2015-02-10 NOTE — Anesthesia Postprocedure Evaluation (Addendum)
  Anesthesia Post-op Note  Patient: Lori Joseph  Procedure(s) Performed: Procedure(s) (LRB): RIGHT THYROID LOBECTOMY (N/A)  Patient Location: PACU  Anesthesia Type: general   Level of Consciousness: awake and alert   Airway and Oxygen Therapy: Patient Spontanous Breathing  Post-op Pain: mild  Post-op Assessment: Post-op Vital signs reviewed, Patient's Cardiovascular Status Stable, Respiratory Function Stable, Patent Airway and No signs of Nausea or vomiting  Last Vitals:  Filed Vitals:   02/10/15 0945  BP: 175/81  Pulse: 57  Temp:   Resp: 14    Post-op Vital Signs: stable   Complications: No apparent anesthesia complications

## 2015-02-10 NOTE — Op Note (Signed)
Right Thyroidectomy Procedure Note  Indications: This patient presents with h/o breast cancer, and right hurthle cell nodule.  Pre-operative Diagnosis:  Hurthle cell nodule   Post-operative Diagnosis: Same  Surgeon:  [name]  Assistants:  Armandina Gemma  Anesthesia: General endotracheal anesthesia  ASA Class: 3  Procedure Details  The patient was seen in the Holding Room. The risks, benefits, complications, treatment options, and expected outcomes were reiterated with the patient. The possibilities of reaction to medication, pulmonary aspiration,  bleeding,  finding a normal thyroid, recurrently laryngeal nerve damage, permanent hypocalcemia, the need for additional procedures, failure to diagnose a condition, and creating a complication requiring transfusion or operation were discussed with the patient. The patient concurred with the proposed plan, giving informed consent.  The site of surgery properly noted. The patient was taken to the Operating Room, identified, and the procedure verified as Right Thyroidectomy, possible total thyroidectomy. A Time Out was held and the above information confirmed.  The patient was placed supine after induction of a general anesthetic.  The breasts and shoulders were taped inferiorly due to the short neck.  The neck was extended and prepped and draped in standard fashion.  A 5 cm transverse cervical incision was created above the sternal notch within a natural skin fold.  The platysma was divided with the bovie.   Subplatysmal flaps were created with the cautery.  The mahorner retractor was placed for visualization.     The strap muscles were identified and divided at the midline.  Sharp dissection was used to mobilize the right thyroid lobe in a medial direction.  Dissection continued posteriorly to expose the tracheoesophageal groove and right carotid artery.  The recurrent laryngeal nerve was not seen, but we stayed high on the thyroid.  The thyroid  lobe was mobilized further and the superior and inferior pole vessels were divided with the harmonic scalpel.  The middle thyroid vein was divided with the harmonic scalpel.  Small vessels were likewise divided.  The gland was rotated in a medial direction and taken off the trachea using the cautery.  The isthmus was removed off the thyroid cartilage using the harmonic scalpel.  The cavity was irrigated.  Hemostasis was achieved with cautery.  Fibrillar hemostatic agent was placed into the neck.    The strap muscles were closed with interrupted 3-0 Vicryl suture.  The platysma was closed with interrupted 3-0 Vicryl suture, and the skin incision was closed with a 4-0 Monocryl subcuticular closure.  Dermabond was applied to the incision.    Instrument, sponge, and needle counts were correct prior to closure and at the conclusion of the case.   Findings: Right thyroid with firm nodule.  No evidence of invasion into adjacent structures.  Left side small with only small nodules.    Estimated Blood Loss:  min         Specimens: right thyroid, stitch marks R superior pole                Complications:  None; patient tolerated the procedure well.         Disposition: PACU - hemodynamically stable.         Condition: stable

## 2015-02-10 NOTE — Discharge Instructions (Signed)
[facility]      [facility] °[phone] ° °THYROID/ PARATHYROID SURGERY: POST OP INSTRUCTIONS ° °Always review your discharge instruction sheet given to you by the facility where your surgery was performed. ° °IF YOU HAVE DISABILITY OR FAMILY LEAVE FORMS, YOU MUST BRING THEM TO THE OFFICE FOR PROCESSING.  PLEASE DO NOT GIVE THEM TO YOUR DOCTOR. ° °1. A prescription for pain medication may be given to you upon discharge.  Take your pain medication as prescribed, if needed.  If narcotic pain medicine is not needed, then you may take acetaminophen (Tylenol) or ibuprofen (Advil) as needed. °2. Take your usually prescribed medications unless otherwise directed. °3. If you need a refill on your pain medication, please contact your pharmacy. They will contact our office to request authorization.  Prescriptions will not be filled after 5pm or on week-ends. °4. You should follow a light diet the first 24 hours after arrival home, such as soup and crackers, etc.  Be sure to include lots of fluids daily.  Resume your normal diet the day after surgery. °5. Most patients will experience some swelling and bruising on the chest and neck area.  Ice packs will help.  Swelling and bruising can take several days to resolve.  °6. It is common to experience some constipation if taking pain medication after surgery.  Increasing fluid intake and taking a stool softener will usually help or prevent this problem from occurring.  A mild laxative (Milk of Magnesia or Miralax) should be taken according to package directions if there are no bowel movements after 48 hours. °7. Unless discharge instructions indicate otherwise, you may remove your bandages 24-48 hours after surgery, and you may shower at that time.  You may have steri-strips (small skin tapes) in place directly over the incision.  These strips should be left on the skin for 7-10 days.  If your surgeon used skin glue on the incision, you may shower in 24 hours.  The  glue will flake off over the next 2-3 weeks.  Any sutures or staples will be removed at the office during your follow-up visit. °8. ACTIVITIES:  You may resume regular (light) daily activities beginning the next day--such as daily self-care, walking, climbing stairs--gradually increasing activities as tolerated.  You may have sexual intercourse when it is comfortable.  Refrain from any heavy lifting or straining until approved by your doctor. °a. You may drive when you no longer are taking prescription pain medication, you can comfortably wear a seatbelt, and you can safely maneuver your car and apply brakes °b. RETURN TO WORK:  __________________________________________________________ °9. You should see your doctor in the office for a follow-up appointment approximately two weeks after your surgery.  Make sure that you call for this appointment within a day or two after you arrive home to insure a convenient appointment time. °10. OTHER INSTRUCTIONS: ____________________________________________________________________________ _________________________________________________________________________________________________________________ °_________________________________________________________________________________________________________________ ° ° °WHEN TO CALL YOUR DOCTOR: °1. Fever over 101.0 °2. Inability to urinate °3. Nausea and/or vomiting °4. Extreme swelling or bruising °5. Continued bleeding from incision. °6. Increased pain, redness, or drainage from the incision. °7. Difficulty swallowing or breathing °8. Muscle cramping or spasms. °9. Numbness or tingling in hands or feet or around lips. ° °The clinic staff is available to answer your questions during regular business hours.  Please don’t hesitate to call and ask to speak to one of the nurses if you have concerns. ° °For further questions, please visit [email or web address] °

## 2015-02-11 ENCOUNTER — Encounter (HOSPITAL_COMMUNITY): Payer: Self-pay | Admitting: General Surgery

## 2015-02-11 DIAGNOSIS — Z7982 Long term (current) use of aspirin: Secondary | ICD-10-CM | POA: Diagnosis not present

## 2015-02-11 DIAGNOSIS — Z803 Family history of malignant neoplasm of breast: Secondary | ICD-10-CM | POA: Diagnosis not present

## 2015-02-11 DIAGNOSIS — D34 Benign neoplasm of thyroid gland: Secondary | ICD-10-CM | POA: Diagnosis not present

## 2015-02-11 DIAGNOSIS — Z853 Personal history of malignant neoplasm of breast: Secondary | ICD-10-CM | POA: Diagnosis not present

## 2015-02-11 DIAGNOSIS — Z888 Allergy status to other drugs, medicaments and biological substances status: Secondary | ICD-10-CM | POA: Diagnosis not present

## 2015-02-11 DIAGNOSIS — Z79899 Other long term (current) drug therapy: Secondary | ICD-10-CM | POA: Diagnosis not present

## 2015-02-11 LAB — BASIC METABOLIC PANEL
Anion gap: 9 (ref 5–15)
BUN: 24 mg/dL — AB (ref 6–23)
CALCIUM: 8.9 mg/dL (ref 8.4–10.5)
CHLORIDE: 104 mmol/L (ref 96–112)
CO2: 23 mmol/L (ref 19–32)
Creatinine, Ser: 1.06 mg/dL (ref 0.50–1.10)
GFR calc Af Amer: 59 mL/min — ABNORMAL LOW (ref >90)
GFR, EST NON AFRICAN AMERICAN: 51 mL/min — AB (ref >90)
Glucose, Bld: 170 mg/dL — ABNORMAL HIGH (ref 70–99)
Potassium: 4.8 mmol/L (ref 3.5–5.1)
Sodium: 136 mmol/L (ref 135–145)

## 2015-02-11 LAB — CALCIUM, IONIZED: Calcium, Ion: 1.26 mmol/L (ref 1.12–1.32)

## 2015-02-11 NOTE — Discharge Summary (Signed)
Physician Discharge Summary  Patient ID: Lori Joseph MRN: [id number] DOB/AGE: 74-Mar-1942 74 y.o.  Admit date: 02/10/2015 Discharge date: 02/11/2015  Admission Diagnoses:  Discharge Diagnoses:  Active Problems:   Hurthle cell neoplasm of thyroid  Patient Active Problem List   Diagnosis Date Noted  . Chest wall pain 12/31/2014  . Hurthle cell neoplasm of thyroid 12/31/2014  . Breast cancer of upper-outer quadrant of left female breast 09/08/2014  . Eczema 08/05/2014  . Left buttock pain 06/08/2013  . Annual physical exam 06/18/2012  . BCC (basal cell carcinoma of skin) 06/18/2012  . OTHER&UNSPECIFIED DISEASES THE ORAL SOFT TISSUES 02/10/2011  . HYPERLIPIDEMIA 01/12/2011  . OSTEOPENIA 09/28/2009  . 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 OBESITY 05/18/2009  . HYPERTENSION 10/15/2007  . PANCREATITIS, HX OF 03/13/2007     Discharged Condition: stable  Hospital Course:  Pt was admitted to the floor following right thyroid lobectomy.  She did well overnight.  She had minimal hoarseness in the AM.  She denied any perioral numbness or cramping.  Vitals were OK.  She had good pain control.  She was discharged to home in stable condition.    Consults: None  Significant Diagnostic Studies: labs: see epic.  Treatments: surgery: see above.    Discharge Exam: Blood pressure 142/64, pulse 61, temperature 97.4 F (36.3 C), temperature source Oral, resp. rate 18, height 5\' 3"  (1.6 m), weight 99.338 kg (219 lb), SpO2 100 %. General appearance: alert, cooperative and no distress Neck: mild swelling, area very soft. Resp: breathing comfortably  Disposition: 01-Home or Self Care  Discharge Instructions    Call MD for:  persistant nausea and vomiting    Complete by:  As directed      Call MD for:  redness, tenderness, or signs of infection (pain, swelling, redness, odor or green/yellow discharge around incision site)    Complete by:  As directed      Call MD for:  severe  uncontrolled pain    Complete by:  As directed      Call MD for:  temperature >100.4    Complete by:  As directed      Diet - low sodium heart healthy    Complete by:  As directed      Increase activity slowly    Complete by:  As directed             Medication List    TAKE these medications        acetaminophen 500 MG tablet  Commonly known as:  TYLENOL  Take 500 mg by mouth every 6 (six) hours as needed for moderate pain or headache.     anastrozole 1 MG tablet  Commonly known as:  ARIMIDEX  Take 1 tablet (1 mg total) by mouth daily.     aspirin 81 MG EC tablet  Take 81 mg by mouth every morning.     calcium carbonate 1250 MG capsule  Take 2,500 mg by mouth every morning.     cholecalciferol 1000 UNITS tablet  Commonly known as:  VITAMIN D  Take 1,000 Units by mouth every morning.     dorzolamide-timolol 22.3-6.8 MG/ML ophthalmic solution  Commonly known as:  COSOPT  Place 1 drop into both eyes every morning.     emollient cream  Commonly known as:  BIAFINE  Apply 1 application topically every morning.     FIBER 7 PO  Take 3 capsules by mouth daily.     fluticasone 0.005 % ointment  Commonly known as:  CUTIVATE  Apply 1 application topically 3 (three) times daily as needed.     fluticasone 50 MCG/ACT nasal spray  Commonly known as:  FLONASE  Place 2 sprays into the nose daily as needed.     hyaluronate sodium Gel  Apply 1 application topically daily.     latanoprost 0.005 % ophthalmic solution  Commonly known as:  XALATAN  Place 1 drop into both eyes at bedtime.     multivitamin tablet  Take 1 tablet by mouth every morning.     olmesartan 40 MG tablet  Commonly known as:  BENICAR  TAKE 1 TABLET (40 MG TOTAL) BY MOUTH DAILY.     pravastatin 40 MG tablet  Commonly known as:  PRAVACHOL  Take 1 tablet (40 mg total) by mouth daily.           Follow-up Information    Follow up with [facility], MD In 3 weeks.   Specialty:  General Surgery    Contact information:   [address] [place] [address] [phone]       Signed: [name] 02/11/2015, 7:17 AM

## 2015-02-14 ENCOUNTER — Ambulatory Visit: Payer: Medicare Other | Admitting: Radiation Oncology

## 2015-02-14 NOTE — Progress Notes (Signed)
Quick Note:  Please let patient know that thyroid lesion is benign! Not cancer! ______

## 2015-02-28 ENCOUNTER — Ambulatory Visit: Payer: Medicare Other | Admitting: Hematology and Oncology

## 2015-02-28 DIAGNOSIS — M545 Low back pain: Secondary | ICD-10-CM | POA: Diagnosis not present

## 2015-02-28 DIAGNOSIS — R109 Unspecified abdominal pain: Secondary | ICD-10-CM | POA: Diagnosis not present

## 2015-02-28 DIAGNOSIS — G894 Chronic pain syndrome: Secondary | ICD-10-CM | POA: Diagnosis not present

## 2015-02-28 DIAGNOSIS — M797 Fibromyalgia: Secondary | ICD-10-CM | POA: Diagnosis not present

## 2015-03-01 ENCOUNTER — Telehealth: Payer: Self-pay | Admitting: Hematology and Oncology

## 2015-03-01 ENCOUNTER — Ambulatory Visit (HOSPITAL_BASED_OUTPATIENT_CLINIC_OR_DEPARTMENT_OTHER): Payer: Medicare Other | Admitting: Hematology and Oncology

## 2015-03-01 VITALS — BP 166/74 | HR 58 | Temp 97.8°F | Resp 18 | Ht 63.0 in | Wt 218.8 lb

## 2015-03-01 DIAGNOSIS — C50412 Malignant neoplasm of upper-outer quadrant of left female breast: Secondary | ICD-10-CM | POA: Diagnosis not present

## 2015-03-01 DIAGNOSIS — Z170 Estrogen receptor positive status [ER+]: Secondary | ICD-10-CM

## 2015-03-01 NOTE — Progress Notes (Signed)
Patient Care Team: Colon Branch, MD as PCP - General  DIAGNOSIS: No matching staging information was found for the patient.  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 N0 M0 Oncotype DX recurrence score 15, 9% ROR   01/25/2015 -  Anti-estrogen oral therapy Anastrozole 1 mg daily 5 years   02/10/2015 Surgery Right thyroid lobectomy: Hurthle cell neoplasm 2.3 cm, adenomatous nodules    CHIEF COMPLIANT: Follow-up of left breast cancer on anastrozole  INTERVAL HISTORY: Lori Joseph is a 74 year old lady with above-mentioned history of left-sided breast cancer currently on anastrozole therapy. She started March 1 and is tolerating it very well without any major problems or concerns. Denies any hot flashes or myalgias. She recently underwent a right thyroid lobectomy. She is recovering very well from this. Her major complaint is his right flank pain for which she had undergone CT scans and investigations. She was referred to a pain physician.  She is getting an MRI for further evaluation. It appears to be at the region of the right kidney area. The CT scan showed a right kidney cyst probably that there is no other lesion that vicinity.  REVIEW OF SYSTEMS:   Constitutional: Denies fevers, chills or abnormal weight loss Eyes: Denies blurriness of vision Ears, nose, mouth, throat, and face: Denies mucositis or sore throat Respiratory: Denies cough, dyspnea or wheezes Cardiovascular: Denies palpitation, chest discomfort or lower extremity swelling Gastrointestinal:  Right flank pain Skin: Denies abnormal skin rashes Lymphatics: Denies new lymphadenopathy or easy bruising Neurological:Denies numbness, tingling or new weaknesses Behavioral/Psych: Mood is stable, no new changes  Breast:   denies any pain or lumps or nodules in either breasts All other systems were reviewed with the patient and are negative.  I have reviewed the past medical history, past surgical history, social history and family history with the patient and they are unchanged from previous note.  ALLERGIES:  is allergic to codeine; carvedilol; felodipine; lisinopril; losartan potassium; and radiaplexrx.  MEDICATIONS:  Current Outpatient Prescriptions  Medication Sig Dispense Refill  . acetaminophen (TYLENOL) 500 MG tablet Take 500 mg by mouth every 6 (six) hours as needed for moderate pain or headache.     . anastrozole (ARIMIDEX) 1 MG tablet Take 1 tablet (1 mg total) by mouth daily. (Patient taking differently: Take 1 mg by mouth every morning. ) 90 tablet 3  . aspirin 81 MG EC tablet Take 81 mg by mouth every morning.     . calcium carbonate 1250 MG capsule Take 2,500 mg by mouth every morning.     . cholecalciferol (VITAMIN D) 1000 UNITS tablet Take 2,500 Units by mouth every morning.     . dorzolamide-timolol (COSOPT) 22.3-6.8 MG/ML ophthalmic solution Place 1 drop into both eyes every morning.     Marland Kitchen emollient (BIAFINE) cream Apply 1 application topically every morning.     . fluticasone (CUTIVATE) 0.005 % ointment Apply 1 application topically 3 (three) times daily as needed. (Patient taking differently: Apply 1 application topically 3 (three) times daily as needed (eczema.). ) 30 g 3  . latanoprost (XALATAN) 0.005 % ophthalmic solution Place 1 drop into both eyes at bedtime.    . Misc Natural Products (FIBER 7 PO) Take 3 capsules by mouth daily.    . Multiple  Vitamin (MULTIVITAMIN) tablet Take 1 tablet by mouth every morning.     . olmesartan (BENICAR) 40 MG tablet TAKE 1 TABLET (40 MG TOTAL) BY MOUTH DAILY. (Patient taking differently: Take 40 mg by mouth every morning. TA) 90 tablet 2  . pravastatin (PRAVACHOL) 40 MG tablet Take 1 tablet (40 mg total) by mouth daily. (Patient taking differently: Take  40 mg by mouth at bedtime. ) 30 tablet 12  . fluticasone (FLONASE) 50 MCG/ACT nasal spray Place 2 sprays into the nose daily as needed.    . hyaluronate sodium (RADIAPLEXRX) GEL Apply 1 application topically daily.      No current facility-administered medications for this visit.    PHYSICAL EXAMINATION: ECOG PERFORMANCE STATUS: 1 - Symptomatic but completely ambulatory  Filed Vitals:   03/01/15 1135  BP: 166/74  Pulse: 58  Temp: 97.8 F (36.6 C)  Resp: 18   Filed Weights   03/01/15 1135  Weight: 218 lb 12.8 oz (99.247 kg)    GENERAL:alert, no distress and comfortable SKIN: skin color, texture, turgor are normal, no rashes or significant lesions EYES: normal, Conjunctiva are pink and non-injected, sclera clear OROPHARYNX:no exudate, no erythema and lips, buccal mucosa, and tongue normal  NECK: supple, thyroid normal size, non-tender, without nodularity LYMPH:  no palpable lymphadenopathy in the cervical, axillary or inguinal LUNGS: clear to auscultation and percussion with normal breathing effort HEART: regular rate & rhythm and no murmurs and no lower extremity edema ABDOMEN:abdomen soft, non-tender and normal bowel sounds Musculoskeletal:no cyanosis of digits and no clubbing  NEURO: alert & oriented x 3 with fluent speech, no focal motor/sensory deficits BREAST: No palpable masses or nodules in either right or left breasts. No palpable axillary supraclavicular or infraclavicular adenopathy no breast tenderness or nipple discharge. (exam performed in the presence of a chaperone)  LABORATORY DATA:  I have reviewed the data as listed   Chemistry      Component Value Date/Time   NA 136 02/11/2015 0503   NA 139 12/24/2014 1515   K 4.8 02/11/2015 0503   K 4.5 12/24/2014 1515   CL 104 02/11/2015 0503   CO2 23 02/11/2015 0503   CO2 24 12/24/2014 1515   BUN 24* 02/11/2015 0503   BUN 14.7 12/24/2014 1515   CREATININE 1.06 02/11/2015 0503   CREATININE 0.8 12/24/2014 1515       Component Value Date/Time   CALCIUM 8.9 02/11/2015 0503   CALCIUM 9.1 12/24/2014 1515   ALKPHOS 107 12/24/2014 1515   ALKPHOS 82 10/15/2007 0000   AST 19 12/24/2014 1515   AST 26 10/16/2013 1019   ALT 18 12/24/2014 1515   ALT 25 10/16/2013 1019   BILITOT 0.62 12/24/2014 1515   BILITOT 0.9 10/15/2007 0000       Lab Results  Component Value Date   WBC 4.3 02/02/2015   HGB 11.7* 02/02/2015   HCT 36.3 02/02/2015   MCV 93.6 02/02/2015   PLT 195 02/02/2015   NEUTROABS 2.9 12/24/2014    ASSESSMENT & PLAN:  Left breast lumpectomy 09/14/2014: 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 N0 M0 Oncotype DX recurrence score 15, 9% ROR Currently on anastrozole 1 mg daily started 01/25/2015  Anastrozole toxicities: Patient denies any problems or concerns taking an acid. She denies any hot flashes or myalgias. Breast cancer surveillance: 1. Breast exam 03/01/2015 is normal 2. Mammograms to be done in October of every year.  Right flank pain: Differential diagnosis between a kidney stone versus intestinal diverticulitis the CT scan done did not show any abnormalities. She is undergoing an MRI evaluation. I discussed with her the importance of drinking lots of water and eating foods that contain high fiber as well as lactobacillus probiotics.  Return to clinic in 6 months for follow  No orders of the defined types were placed in this encounter.   The patient has a good understanding of the overall plan. she agrees with it. She will call with any problems that may develop before her next visit here.   Rulon Eisenmenger, MD

## 2015-03-01 NOTE — Telephone Encounter (Signed)
Appointments made and avs printed for patient °

## 2015-03-10 ENCOUNTER — Other Ambulatory Visit: Payer: Medicare Other

## 2015-03-10 ENCOUNTER — Ambulatory Visit (HOSPITAL_BASED_OUTPATIENT_CLINIC_OR_DEPARTMENT_OTHER): Payer: Medicare Other | Admitting: Genetic Counselor

## 2015-03-10 DIAGNOSIS — Z8041 Family history of malignant neoplasm of ovary: Secondary | ICD-10-CM

## 2015-03-10 DIAGNOSIS — C50412 Malignant neoplasm of upper-outer quadrant of left female breast: Secondary | ICD-10-CM

## 2015-03-10 DIAGNOSIS — C50912 Malignant neoplasm of unspecified site of left female breast: Secondary | ICD-10-CM

## 2015-03-10 DIAGNOSIS — Z803 Family history of malignant neoplasm of breast: Secondary | ICD-10-CM

## 2015-03-11 ENCOUNTER — Encounter: Payer: Self-pay | Admitting: Genetic Counselor

## 2015-03-11 DIAGNOSIS — Z8041 Family history of malignant neoplasm of ovary: Secondary | ICD-10-CM | POA: Insufficient documentation

## 2015-03-11 DIAGNOSIS — Z803 Family history of malignant neoplasm of breast: Secondary | ICD-10-CM | POA: Insufficient documentation

## 2015-03-11 DIAGNOSIS — C50919 Malignant neoplasm of unspecified site of unspecified female breast: Secondary | ICD-10-CM | POA: Insufficient documentation

## 2015-03-11 NOTE — Progress Notes (Signed)
[facility] Patient Visit  REFERRING PROVIDER: Colon Branch, MD [address] [place], [place] [address]  PRIMARY PROVIDER:  Kathlene November, MD  PRIMARY REASON FOR VISIT:  1. Breast cancer, left   2. Family history of malignant neoplasm of breast   3. Family history of malignant neoplasm of ovary     HISTORY OF PRESENT ILLNESS:   Lori Joseph, a 74 y.o. female, was seen for a [facility] cancer genetics consultation at the request of Dr. Larose Kells due to a personal and family history of cancer.  Lori Joseph presents to clinic today to discuss the possibility of a hereditary predisposition to cancer, genetic testing, and to further clarify her future cancer risks, as well as potential cancer risks for family members.   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 N0 M0 Oncotype DX recurrence score 15, 9% ROR   01/25/2015 -  Anti-estrogen oral therapy Anastrozole 1 mg daily 5 years   02/10/2015 Surgery Right thyroid lobectomy: Hurthle cell neoplasm 2.3 cm, adenomatous nodules    Past Medical History  Diagnosis Date   HYPERTENSION    OSTEOPENIA    HYPERLIPIDEMIA    Allergic rhinitis    H/O retinal detachment    H/O acute pancreatitis    Family history of anesthesia complication     daughter hard to wake up   Cancer     breast -left   Hurthle cell neoplasm of thyroid     Right     Past Surgical History  Procedure Laterality Date   Tubal ligation     Laser eye surgery, detached retina Left    Breast biopsy Left 08/27/14   Cholecystectomy  2000   Tonsillectomy     Dilation and curettage of uterus     Colonoscopy     Radioactive seed guided mastectomy with axillary sentinel lymph node biopsy Left 09/16/2014   Procedure: RADIOACTIVE SEED GUIDED PARTIAL MASTECTOMY WITH AXILLARY SENTINEL LYMPH NODE BIOPSY;  Surgeon: [name], MD;  Location: [facility];  Service: General;  Laterality: Left;   Thyroid lobectomy N/A 02/10/2015    Procedure: RIGHT THYROID LOBECTOMY;  Surgeon: [name], MD;  Location: [facility];  Service: General;  Laterality: N/A;    History   Social History   Marital Status: Married    Spouse Name: Ruthann Cancer   Number of Children: 2   Years of Education: N/A   Occupational History   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 A copy of the pedigree with be scanned into Epic under the Media tab. Significant family history diagnoses include the following: Family History  Problem Relation Age of Onset   Colon cancer Neg Hx    CAD Neg Hx    Diabetes Neg Hx  Breast cancer Sister 60   Cancer Father     lung cancer ; smoker   Cancer Brother     3 brothers with lung cancer, all smokers   Cancer Paternal Uncle     2 pat uncles with lung cancer, smokers   Kidney cancer Sister 33   Melanoma Sister 56   Ovarian cancer Other 46    niece with ovarian cancer (related through sister with breast cancer)   Lori Joseph ancestry is of Caucasian descent. There is no known Jewish ancestry or consanguinity.  GENETIC COUNSELING ASSESSMENT:  Lori Joseph is a 74 y.o. female with a personal and family history of cancer suggestive of a hereditary predisposition to cancer. We, therefore, discussed and recommended the following at today's visit.   DISCUSSION:  We reviewed the characteristics, features and inheritance patterns  of hereditary cancer syndromes. We also discussed genetic testing, including the appropriate family members to test, the process of testing, insurance coverage and turn-around-time for results. We discussed the implications of a negative, positive and/or variant of uncertain significant result. We recommended Lori Joseph pursue genetic testing for the OvaNext gene panel. The OvaNext gene panel offered by [employer] includes sequencing and rearrangement analysis for the following 24 genes:ATM, BARD1, BRCA1, BRCA2, BRIP1, CDH1, CHEK2, EPCAM, MLH1, MRE11A, MSH2, MSH6, MUTYH, NBN, NF1, PALB2, PMS2, PTEN, RAD50, RAD51C, RAD51D, SMARCA4, STK11, and TP53.  PLAN:  Based on our above recommendation, Lori Joseph wished to pursue genetic testing and the blood sample was drawn and will be sent to [facility] for analysis. Results should be available within approximately 4 weeks time, at which point they will be disclosed by telephone to Lori Joseph, as will any additional recommendations warranted by these results. Lastly, we encouraged Lori Joseph to remain in contact with cancer genetics annually so that we can continuously update the family history and inform her of any changes in cancer genetics and testing that may be of benefit for this family.   Ms.  Joseph questions were answered to her satisfaction today. Our contact information was provided should additional questions or concerns arise. Thank you for the referral and allowing [name] to share in the care of your patient.   Catherine A. Fine, MS, CGC Certified Psychologist, sport and [email or web address] phone: [phone]  The patient was seen for a total of 45 minutes in face-to-face genetic counseling.  This patient was discussed with Dr. Lindi Adie who agrees with the above.    ______________________________________________________________________ For Office Staff:  Number of people involved in session including genetic counselor: 2 Was an  intern or student involved with case: not applicable

## 2015-03-16 ENCOUNTER — Telehealth: Payer: Self-pay | Admitting: Internal Medicine

## 2015-03-16 NOTE — Telephone Encounter (Signed)
Please advise 

## 2015-03-16 NOTE — Telephone Encounter (Signed)
Spoke with Pt, informed her of recommendations by Dr. Larose Kells. Informed her that we can refer to another Pain management doctor or ortho. Pt stated she did not want to see another pain management or orthopedic doctor. Informed her she can make an appt to speak with Dr. Larose Kells, but stated she already has an appt scheduled for May 15, and will speak with him then.

## 2015-03-16 NOTE — Telephone Encounter (Signed)
I don't know that a MRI is appropriate, we can send her for a second opinion to another pain management office or a orthopedic doctor

## 2015-03-16 NOTE — Telephone Encounter (Signed)
Relation to pt: self  Call back number: [phone]   Reason for call:   Pt states she saw Kateri Plummer Fine, [facility] for pain management and feels as if the muscle relaxers were "pushed on her", pt states they didn't do anything for her regarding her councerns. Pt would like MD to order a MRI due to the pain in right side. Pt had a CT scan and the findings were cyst on kidney. Please advise

## 2015-03-16 NOTE — Telephone Encounter (Signed)
Thank you :)

## 2015-03-18 ENCOUNTER — Encounter: Payer: Self-pay | Admitting: Internal Medicine

## 2015-03-18 ENCOUNTER — Ambulatory Visit (INDEPENDENT_AMBULATORY_CARE_PROVIDER_SITE_OTHER): Payer: Medicare Other | Admitting: Internal Medicine

## 2015-03-18 VITALS — BP 124/64 | HR 60 | Temp 98.4°F | Ht 63.0 in | Wt 215.5 lb

## 2015-03-18 DIAGNOSIS — R0789 Other chest pain: Secondary | ICD-10-CM | POA: Diagnosis not present

## 2015-03-18 MED ORDER — LIDOCAINE 5 % EX PTCH: 1.0000 | MEDICATED_PATCH | CUTANEOUS | Status: DC

## 2015-03-18 NOTE — Patient Instructions (Signed)
One or 2 patches at the place where you are hurting once daily as needed. We'll refer you to another doctor

## 2015-03-18 NOTE — Progress Notes (Signed)
Subjective:    Patient ID: Lori Joseph, female    DOB: 01-28-1941, 74 y.o.   MRN: [id number]  DOS:  03/18/2015 Type of visit - description : acute Interval history: Here for the reassessment of right side pain, again this started > than 2 years, has moderate to intense pain at the lateral aspect of the chest/flank, mostly when she gets up or roll in  bed. Pain decrease if she applies pressure with her hand. No associated nausea, vomiting, diarrhea, constipation, blood in the stools. Not worse by eating. Does not recall any previous injury. No previous rash in that area. Denies thoracic back pain, no dermatomal pain.   Review of Systems See hpi  Past Medical History  Diagnosis Date  . HYPERTENSION   . OSTEOPENIA   . HYPERLIPIDEMIA   . Allergic rhinitis   . H/O retinal detachment   . H/O acute pancreatitis   . Family history of anesthesia complication     daughter hard to wake up  . Cancer     breast -left  . Hurthle cell neoplasm of thyroid     Right     Past Surgical History  Procedure Laterality Date  . Tubal ligation    . Laser eye surgery, detached retina Left   . Breast biopsy Left 08/27/14  . Cholecystectomy  2000  . Tonsillectomy    . Dilation and curettage of uterus    . Colonoscopy    . Radioactive seed guided mastectomy with axillary sentinel lymph node biopsy Left 09/16/2014    Procedure: RADIOACTIVE SEED GUIDED PARTIAL MASTECTOMY WITH AXILLARY SENTINEL LYMPH NODE BIOPSY;  Surgeon: [name], MD;  Location: [place];  Service: General;  Laterality: Left;  . Thyroid lobectomy N/A 02/10/2015    Procedure: RIGHT THYROID LOBECTOMY;  Surgeon: [name], MD;  Location: [facility];  Service: General;  Laterality: N/A;    History   Social History  . Marital Status: Married    Spouse Name: Ruthann Cancer  . Number of Children: 2  . 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  Smoking status: Never Smoker   . Smokeless tobacco: Not on file  . Alcohol Use: No     Comment: rarely wine  . Drug Use: No  . Sexual Activity: Not on file   Other Topics Concern  . Not on file   Social History Narrative   Lives w/ husband        Medication List       This list is accurate as of: 03/18/15 11:59 PM.  Always use your most recent med list.               acetaminophen 500 MG tablet  Commonly known as:  TYLENOL  Take 500 mg by mouth every 6 (six) hours as needed for moderate pain or headache.     anastrozole 1 MG tablet  Commonly known as:  ARIMIDEX  Take 1 tablet (1 mg total) by mouth daily.     aspirin 81 MG EC tablet  Take 81 mg by mouth every morning.     calcium carbonate 1250 MG capsule  Take 2,500 mg by mouth every morning.     cholecalciferol 1000 UNITS tablet  Commonly known as:  VITAMIN D  Take 2,500 Units by mouth every morning.     dorzolamide-timolol 22.3-6.8 MG/ML ophthalmic solution  Commonly known as:  COSOPT  Place 1 drop into both eyes every morning.     emollient cream  Commonly known as:  BIAFINE  Apply 1 application topically every morning.     FIBER 7 PO  Take 3 capsules by mouth daily.     fluticasone 0.005 % ointment  Commonly known as:  CUTIVATE  Apply 1 application topically 3 (three) times daily as needed.     fluticasone 50 MCG/ACT nasal spray  Commonly known as:  FLONASE  Place 2 sprays into the nose daily as needed.     hyaluronate sodium Gel  Apply 1 application topically daily.     latanoprost 0.005 % ophthalmic solution  Commonly known as:  XALATAN  Place 1 drop into both eyes at bedtime.     lidocaine 5 %  Commonly known as:  LIDODERM  Place 1-2 patches onto the skin daily. Remove & Discard patch within 12 hours or as directed by MD     multivitamin tablet  Take 1 tablet by mouth every morning.     olmesartan 40 MG tablet  Commonly known as:  BENICAR  TAKE 1 TABLET (40 MG TOTAL) BY MOUTH DAILY.       pravastatin 40 MG tablet  Commonly known as:  PRAVACHOL  Take 1 tablet (40 mg total) by mouth daily.           Objective:   Physical Exam  Abdominal:     BP 124/64 mmHg  Pulse 60  Temp(Src) 98.4 F (36.9 C) (Oral)  Ht 5\' 3"  (1.6 m)  Wt 215 lb 8 oz (97.75 kg)  BMI 38.18 kg/m2  SpO2 98%  General:   Well developed, well nourished . NAD.  HEENT:  Normocephalic . Face symmetric, atraumatic Lungs:  CTA B Normal respiratory effort, no intercostal retractions, no accessory muscle use. Heart: RRR,  no murmur.  Abdomen:  Not distended, soft, non-tender. No rebound or rigidity. No mass,organomegaly. Muscle skeletal: no pretibial edema bilaterally  Skin: Not pale. Not jaundice Neurologic:  alert & oriented X3.  Speech normal, gait appropriate for age and unassisted. DTRs symmetric Psych--  Cognition and judgment appear intact.  Cooperative with normal attention span and concentration.  Behavior appropriate. No anxious or depressed appearing.      Assessment & Plan:      Today , I spent more than 25   min with the patient: >50% of the time counseling regards her pain, reassuring her that most likely this pain is due to a benign, MSK condition. We went over her recent x-rays and CTs.

## 2015-03-18 NOTE — Progress Notes (Signed)
Pre visit review using our clinic review tool, if applicable. No additional management support is needed unless otherwise documented below in the visit note. 

## 2015-03-19 NOTE — Assessment & Plan Note (Signed)
Recent rib series and CT of the abdomen and pelvis did not show any concerns in that area. She has a history of breast cancer, also status post partial  thyroidectomy for a Hurthle lesion After revisiting her history and physical, I believe the pain is related to the chest wall specifically to the 10th and 11th rib Recently saw physician assistant at the pain management clinic and was prescribed muscle relaxant but she is not satisfied with advise. Plan: I think the best course of action is to send her to orthopedic surgery, see if they agree with my assessment, will try [facility]. Trial with Lidoderm patch

## 2015-03-22 ENCOUNTER — Telehealth: Payer: Self-pay | Admitting: Internal Medicine

## 2015-03-22 NOTE — Telephone Encounter (Signed)
Caller name: F[place] Relation to pt: self Call back number: [phone] Pharmacy:  Reason for call:   Patient states that she took the pain patch rx to the pharmacy. Pharmacy told her that if the dr called rx in to the insurance that her copay would be much cheaper. [employer]

## 2015-03-23 NOTE — Telephone Encounter (Signed)
PA initiated. Awaiting determination. JG//CMA 

## 2015-03-30 NOTE — Telephone Encounter (Signed)
Additional information faxed to [facility]

## 2015-03-30 NOTE — Telephone Encounter (Signed)
PA approved effective through 03/22/2016. JG//CMA

## 2015-03-31 ENCOUNTER — Encounter: Payer: Self-pay | Admitting: Genetic Counselor

## 2015-03-31 DIAGNOSIS — Z803 Family history of malignant neoplasm of breast: Secondary | ICD-10-CM

## 2015-03-31 DIAGNOSIS — Z8041 Family history of malignant neoplasm of ovary: Secondary | ICD-10-CM

## 2015-03-31 DIAGNOSIS — C50912 Malignant neoplasm of unspecified site of left female breast: Secondary | ICD-10-CM

## 2015-03-31 NOTE — Progress Notes (Signed)
[facility] Genetic Test Results   REFERRING PROVIDER: Dr. Lindi Adie  PRIMARY PROVIDER:  Kathlene November, MD  PRIMARY REASON FOR VISIT:  Patient Active Problem List   Diagnosis Date Noted   Family history of malignant neoplasm of breast 03/11/2015   Family history of malignant neoplasm of ovary 03/11/2015   Breast cancer 03/11/2015   GENETIC TEST RESULT:  Testing Laboratory: [facility]  Test Ordered: OvaNext gene panel Date of Report: 03/28/2015 Result: Normal, no pathogenic mutations identified General Interpretation: Reassuring  HPI: Ms. Crandell was previously seen in the [facility] due to concerns regarding a hereditary predisposition to cancer. Please refer to our prior cancer genetics clinic note for more information regarding Ms. Caban medical, social and family histories, and our assessment and recommendations, at the time. Ms. Fanton genetic test results and recommendations warranted by these results were recently disclosed to her and are discussed in more detail below.  GENETIC TEST RESULTS: At the time of Ms. Branagan visit, we recommended she pursue genetic testing, which includes sequencing and deletion/duplication analysis of several genes associated with an increased risk for cancer via a gene panel. The OvaNext gene panel offered by [employer] includes sequencing and rearrangement analysis for the following 24 genes:ATM, BARD1, BRCA1, BRCA2, BRIP1, CDH1, CHEK2, EPCAM, MLH1, MRE11A, MSH2, MSH6, MUTYH, NBN, NF1, PALB2, PMS2, PTEN, RAD50, RAD51C, RAD51D, SMARCA4, STK11, and TP53. Genetic testing for this gene panel was normal and did not reveal a pathogenic mutation in any of these genes. A copy of the genetic test report will be scanned into Epic under the media tab.   We discussed with Ms. Rorke that current genetic testing is not perfect, and it is, therefore, possible there may be a pathogenic gene mutation in  one of these genes that current testing cannot detect, but that chance is small.  We also discussed, that it is possible that another gene that has not yet been discovered, or that we have not yet tested, is responsible for the cancer diagnoses in her family. It is, therefore, important for Ms. Antwine to continue to remain in touch with cancer genetics so that we can continue to offer Ms. Jokerst the most up to date genetic testing.   CANCER SCREENING RECOMMENDATIONS:This result is reassuring and indicates that Ms. Brazel likely does not have an increased risk for a future cancer due to a mutation in one of these genes. This normal test also suggests that Ms. Lambing cancer was most likely not due to an inherited predisposition associated with one of these genes.  Most cancers happen by chance and this negative test suggests that her cancer falls into this category.  We, therefore, recommended she continue to follow the cancer management and screening guidelines provided by her oncology and primary healthcare providers.   RECOMMENDATIONS FOR FAMILY MEMBERS:  We recommended further genetic testing in Ms. Sitton family as such testing might help [name] be even more confident in Ms. Nordell own negative results. Genetic testing is best initiated in someone in the family who has had cancer.  In this family, Ms. Horace niece who was diagnosed with ovarian cancer would be the most informative person to test. Please let us know if we can help facilitate testing and/or a referral. Cancer genetic counselors can also be located, by visiting the website of the [employer] ([employer]) and Field seismologist for a cancer Dietitian by zip code.  FOLLOW-UP: Lastly, we discussed  with Ms. Gaynor that cancer genetics is a rapidly advancing field and it is likely that new genetic tests will be appropriate for her and/or family members in the future. We encouraged her to remain in contact with cancer genetics on an  annual basis so we can update her personal and family histories and let her know of advances in cancer genetics that may benefit this family.   Our contact number was provided. Ms. Buechner questions were answered to her satisfaction, and she knows she is welcome to call us at anytime with additional questions or concerns.    Catherine A. Fine, MS, CGC Certified Psychologist, [email or web address] Phone: [phone]

## 2015-04-01 DIAGNOSIS — J329 Chronic sinusitis, unspecified: Secondary | ICD-10-CM | POA: Diagnosis not present

## 2015-04-01 DIAGNOSIS — H109 Unspecified conjunctivitis: Secondary | ICD-10-CM | POA: Diagnosis not present

## 2015-04-05 DIAGNOSIS — R0781 Pleurodynia: Secondary | ICD-10-CM | POA: Diagnosis not present

## 2015-04-11 DIAGNOSIS — J0111 Acute recurrent frontal sinusitis: Secondary | ICD-10-CM | POA: Diagnosis not present

## 2015-04-14 ENCOUNTER — Other Ambulatory Visit: Payer: Self-pay

## 2015-04-14 ENCOUNTER — Ambulatory Visit (INDEPENDENT_AMBULATORY_CARE_PROVIDER_SITE_OTHER): Payer: Medicare Other | Admitting: Internal Medicine

## 2015-04-14 ENCOUNTER — Encounter: Payer: Self-pay | Admitting: Internal Medicine

## 2015-04-14 VITALS — BP 124/78 | HR 54 | Temp 97.6°F | Ht 63.0 in | Wt 218.0 lb

## 2015-04-14 DIAGNOSIS — D649 Anemia, unspecified: Secondary | ICD-10-CM

## 2015-04-14 DIAGNOSIS — E785 Hyperlipidemia, unspecified: Secondary | ICD-10-CM

## 2015-04-14 DIAGNOSIS — Z9009 Acquired absence of other part of head and neck: Secondary | ICD-10-CM

## 2015-04-14 DIAGNOSIS — R7989 Other specified abnormal findings of blood chemistry: Secondary | ICD-10-CM

## 2015-04-14 DIAGNOSIS — E890 Postprocedural hypothyroidism: Secondary | ICD-10-CM

## 2015-04-14 DIAGNOSIS — R0789 Other chest pain: Secondary | ICD-10-CM

## 2015-04-14 DIAGNOSIS — D34 Benign neoplasm of thyroid gland: Secondary | ICD-10-CM | POA: Diagnosis not present

## 2015-04-14 DIAGNOSIS — Z9889 Other specified postprocedural states: Secondary | ICD-10-CM | POA: Diagnosis not present

## 2015-04-14 DIAGNOSIS — I10 Essential (primary) hypertension: Secondary | ICD-10-CM | POA: Diagnosis not present

## 2015-04-14 LAB — CBC WITH DIFFERENTIAL/PLATELET
Basophils Absolute: 0 10*3/uL (ref 0.0–0.1)
Basophils Relative: 0.4 % (ref 0.0–3.0)
EOS ABS: 0.1 10*3/uL (ref 0.0–0.7)
Eosinophils Relative: 1.6 % (ref 0.0–5.0)
HCT: 35.3 % — ABNORMAL LOW (ref 36.0–46.0)
HEMOGLOBIN: 11.8 g/dL — AB (ref 12.0–15.0)
LYMPHS PCT: 22.9 % (ref 12.0–46.0)
Lymphs Abs: 1.2 10*3/uL (ref 0.7–4.0)
MCHC: 33.4 g/dL (ref 30.0–36.0)
MCV: 91.7 fl (ref 78.0–100.0)
Monocytes Absolute: 0.3 10*3/uL (ref 0.1–1.0)
Monocytes Relative: 6 % (ref 3.0–12.0)
NEUTROS ABS: 3.8 10*3/uL (ref 1.4–7.7)
Neutrophils Relative %: 69.1 % (ref 43.0–77.0)
Platelets: 212 10*3/uL (ref 150.0–400.0)
RBC: 3.84 Mil/uL — ABNORMAL LOW (ref 3.87–5.11)
RDW: 14.2 % (ref 11.5–15.5)
WBC: 5.4 10*3/uL (ref 4.0–10.5)

## 2015-04-14 LAB — TSH: TSH: 6.06 u[IU]/mL — ABNORMAL HIGH (ref 0.35–4.50)

## 2015-04-14 LAB — T4, FREE: FREE T4: 0.73 ng/dL (ref 0.60–1.60)

## 2015-04-14 LAB — T3, FREE: T3 FREE: 2.3 pg/mL (ref 2.3–4.2)

## 2015-04-14 NOTE — Assessment & Plan Note (Signed)
Chronic right-sided pain, much improved, the lidocaine patches and a local injection helped tremendously.

## 2015-04-14 NOTE — Progress Notes (Signed)
Subjective:    Patient ID: Lori Joseph, female    DOB: 1940/12/11, 74 y.o.   MRN: [id number]  DOS:  04/14/2015 Type of visit - description : Routine office visit Interval history: Chronic right-sided pain, much improved compared to the last time she was here Hypertension, good compliance of medication, ambulatory BPs within normal, needs a refill of medication High cholesterol, would like to get off statins, plans to improve her diet Status post thyroidectomy, no blood work since then.   Review of Systems Denies chest pain, difficulty breathing or lower extremity edema No nausea, vomiting, diarrhea  Past Medical History  Diagnosis Date  . HYPERTENSION   . OSTEOPENIA   . HYPERLIPIDEMIA   . Allergic rhinitis   . H/O retinal detachment   . H/O acute pancreatitis   . Family history of anesthesia complication     daughter hard to wake up  . Cancer     breast -left  . Hurthle cell neoplasm of thyroid     Right     Past Surgical History  Procedure Laterality Date  . Tubal ligation    . Laser eye surgery, detached retina Left   . Breast biopsy Left 08/27/14  . Cholecystectomy  2000  . Tonsillectomy    . Dilation and curettage of uterus    . Colonoscopy    . Radioactive seed guided mastectomy with axillary sentinel lymph node biopsy Left 09/16/2014    Procedure: RADIOACTIVE SEED GUIDED PARTIAL MASTECTOMY WITH AXILLARY SENTINEL LYMPH NODE BIOPSY;  Surgeon: [name], MD;  Location: [place];  Service: General;  Laterality: Left;  . Thyroid lobectomy N/A 02/10/2015    Procedure: RIGHT THYROID LOBECTOMY;  Surgeon: [name], MD;  Location: [facility];  Service: General;  Laterality: N/A;    History   Social History  . Marital Status: Married    Spouse Name: Ruthann Cancer  . Number of Children: 2  . 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 Smoking status: Never Smoker   . Smokeless  tobacco: Not on file  . Alcohol Use: No     Comment: rarely wine  . Drug Use: No  . Sexual Activity: Not on file   Other Topics Concern  . Not on file   Social History Narrative   Lives w/ husband        Medication List       This list is accurate as of: 04/14/15  6:19 PM.  Always use your most recent med list.               acetaminophen 500 MG tablet  Commonly known as:  TYLENOL  Take 500 mg by mouth every 6 (six) hours as needed for moderate pain or headache.     anastrozole 1 MG tablet  Commonly known as:  ARIMIDEX  Take 1 tablet (1 mg total) by mouth daily.     aspirin 81 MG EC tablet  Take 81 mg by mouth every morning.     azithromycin 250 MG tablet  Commonly known as:  ZITHROMAX  As directed.     calcium carbonate 1250 MG capsule  Take 2,500 mg by mouth every morning.     cholecalciferol 1000 UNITS tablet  Commonly known as:  VITAMIN D  Take 2,500 Units by mouth every morning.     dorzolamide-timolol 22.3-6.8 MG/ML ophthalmic solution  Commonly known as:  COSOPT  Place 1 drop into both eyes every morning.     emollient cream  Commonly known as:  BIAFINE  Apply 1 application topically every morning.     FIBER 7 PO  Take 3 capsules by mouth daily.     fluticasone 0.005 % ointment  Commonly known as:  CUTIVATE  Apply 1 application topically 3 (three) times daily as needed.     fluticasone 50 MCG/ACT nasal spray  Commonly known as:  FLONASE  Place 2 sprays into the nose daily as needed.     hyaluronate sodium Gel  Apply 1 application topically daily.     latanoprost 0.005 % ophthalmic solution  Commonly known as:  XALATAN  Place 1 drop into both eyes at bedtime.     lidocaine 5 %  Commonly known as:  LIDODERM  Place 1-2 patches onto the skin daily. Remove & Discard patch within 12 hours or as directed by MD     multivitamin tablet  Take 1 tablet by mouth every morning.     olmesartan 40 MG tablet  Commonly known as:  BENICAR  TAKE 1  TABLET (40 MG TOTAL) BY MOUTH DAILY.     pravastatin 40 MG tablet  Commonly known as:  PRAVACHOL  Take 1 tablet (40 mg total) by mouth daily.           Objective:   Physical Exam BP 124/78 mmHg  Pulse 54  Temp(Src) 97.6 F (36.4 C) (Oral)  Ht 5\' 3"  (1.6 m)  Wt 218 lb (98.884 kg)  BMI 38.63 kg/m2  SpO2 98%  General:   Well developed, well nourished . NAD.  HEENT:  Normocephalic . Face symmetric, atraumatic Lungs:  CTA B Normal respiratory effort, no intercostal retractions, no accessory muscle use. Heart: RRR,  no murmur.  No pretibial edema bilaterally  Skin: Not pale. Not jaundice Neurologic:  alert & oriented X3.  Speech normal, gait appropriate for age and unassisted Psych--  Cognition and judgment appear intact.  Cooperative with normal attention span and concentration.  Behavior appropriate. No anxious or depressed appearing.       Assessment & Plan:      Hypertension, last BMP very good, refill medications High cholesterol, last FLP satisfactory, refill Pravachol, encouraged a better diet and exercise

## 2015-04-14 NOTE — Patient Instructions (Signed)
Get your blood work before you leave    Come back to the office by 07-2015   for a physical exam  Please schedule an appointment at the front desk    Come back fasting

## 2015-04-14 NOTE — Progress Notes (Signed)
Pre visit review using our clinic review tool, if applicable. No additional management support is needed unless otherwise documented below in the visit note. 

## 2015-04-15 DIAGNOSIS — R0781 Pleurodynia: Secondary | ICD-10-CM | POA: Diagnosis not present

## 2015-04-19 MED ORDER — LEVOTHYROXINE SODIUM 25 MCG PO TABS: 25.0000 ug | ORAL_TABLET | Freq: Every day | ORAL | Status: DC

## 2015-04-19 NOTE — Addendum Note (Signed)
Addended by: Wilfrid Lund on: 04/19/2015 01:43 PM   Modules accepted: Orders

## 2015-04-21 ENCOUNTER — Ambulatory Visit: Payer: Medicare Other | Admitting: Radiation Oncology

## 2015-05-11 DIAGNOSIS — H43811 Vitreous degeneration, right eye: Secondary | ICD-10-CM | POA: Diagnosis not present

## 2015-05-11 DIAGNOSIS — Z9889 Other specified postprocedural states: Secondary | ICD-10-CM | POA: Diagnosis not present

## 2015-05-11 DIAGNOSIS — H40003 Preglaucoma, unspecified, bilateral: Secondary | ICD-10-CM | POA: Diagnosis not present

## 2015-05-16 ENCOUNTER — Other Ambulatory Visit: Payer: Medicare Other

## 2015-05-17 ENCOUNTER — Other Ambulatory Visit: Payer: Self-pay | Admitting: Internal Medicine

## 2015-05-28 ENCOUNTER — Emergency Department (HOSPITAL_COMMUNITY)
Admission: EM | Admit: 2015-05-28 | Discharge: 2015-05-28 | Disposition: A | Discharge status: Home / Self Care | Payer: Medicare Other | Attending: Emergency Medicine | Admitting: Emergency Medicine

## 2015-05-28 ENCOUNTER — Encounter (HOSPITAL_COMMUNITY): Payer: Self-pay | Admitting: Emergency Medicine

## 2015-05-28 ENCOUNTER — Emergency Department (HOSPITAL_COMMUNITY): Payer: Medicare Other

## 2015-05-28 DIAGNOSIS — Z79899 Other long term (current) drug therapy: Secondary | ICD-10-CM | POA: Insufficient documentation

## 2015-05-28 DIAGNOSIS — R110 Nausea: Secondary | ICD-10-CM | POA: Diagnosis not present

## 2015-05-28 DIAGNOSIS — Z7982 Long term (current) use of aspirin: Secondary | ICD-10-CM | POA: Diagnosis not present

## 2015-05-28 DIAGNOSIS — E785 Hyperlipidemia, unspecified: Secondary | ICD-10-CM | POA: Diagnosis not present

## 2015-05-28 DIAGNOSIS — R509 Fever, unspecified: Secondary | ICD-10-CM | POA: Insufficient documentation

## 2015-05-28 DIAGNOSIS — Z8669 Personal history of other diseases of the nervous system and sense organs: Secondary | ICD-10-CM | POA: Diagnosis not present

## 2015-05-28 DIAGNOSIS — R5381 Other malaise: Secondary | ICD-10-CM | POA: Insufficient documentation

## 2015-05-28 DIAGNOSIS — Z853 Personal history of malignant neoplasm of breast: Secondary | ICD-10-CM | POA: Diagnosis not present

## 2015-05-28 DIAGNOSIS — I10 Essential (primary) hypertension: Secondary | ICD-10-CM | POA: Insufficient documentation

## 2015-05-28 DIAGNOSIS — R079 Chest pain, unspecified: Secondary | ICD-10-CM | POA: Diagnosis not present

## 2015-05-28 DIAGNOSIS — M6281 Muscle weakness (generalized): Secondary | ICD-10-CM | POA: Insufficient documentation

## 2015-05-28 DIAGNOSIS — Z8719 Personal history of other diseases of the digestive system: Secondary | ICD-10-CM | POA: Insufficient documentation

## 2015-05-28 DIAGNOSIS — R05 Cough: Secondary | ICD-10-CM | POA: Diagnosis not present

## 2015-05-28 DIAGNOSIS — Z85850 Personal history of malignant neoplasm of thyroid: Secondary | ICD-10-CM | POA: Diagnosis not present

## 2015-05-28 LAB — URINE MICROSCOPIC-ADD ON

## 2015-05-28 LAB — CBC WITH DIFFERENTIAL/PLATELET
BASOS ABS: 0 10*3/uL (ref 0.0–0.1)
Basophils Relative: 0 % (ref 0–1)
EOS PCT: 0 % (ref 0–5)
Eosinophils Absolute: 0 10*3/uL (ref 0.0–0.7)
HEMATOCRIT: 36.6 % (ref 36.0–46.0)
Hemoglobin: 12 g/dL (ref 12.0–15.0)
Lymphocytes Relative: 7 % — ABNORMAL LOW (ref 12–46)
Lymphs Abs: 0.6 10*3/uL — ABNORMAL LOW (ref 0.7–4.0)
MCH: 30.8 pg (ref 26.0–34.0)
MCHC: 32.8 g/dL (ref 30.0–36.0)
MCV: 94.1 fL (ref 78.0–100.0)
MONO ABS: 0.4 10*3/uL (ref 0.1–1.0)
Monocytes Relative: 5 % (ref 3–12)
Neutro Abs: 7.9 10*3/uL — ABNORMAL HIGH (ref 1.7–7.7)
Neutrophils Relative %: 88 % — ABNORMAL HIGH (ref 43–77)
Platelets: 155 10*3/uL (ref 150–400)
RBC: 3.89 MIL/uL (ref 3.87–5.11)
RDW: 13.2 % (ref 11.5–15.5)
WBC: 8.9 10*3/uL (ref 4.0–10.5)

## 2015-05-28 LAB — URINALYSIS, ROUTINE W REFLEX MICROSCOPIC
Bilirubin Urine: NEGATIVE
GLUCOSE, UA: NEGATIVE mg/dL
HGB URINE DIPSTICK: NEGATIVE
Ketones, ur: NEGATIVE mg/dL
Nitrite: NEGATIVE
PH: 6 (ref 5.0–8.0)
Protein, ur: NEGATIVE mg/dL
Specific Gravity, Urine: 1.024 (ref 1.005–1.030)
UROBILINOGEN UA: 0.2 mg/dL (ref 0.0–1.0)

## 2015-05-28 LAB — COMPREHENSIVE METABOLIC PANEL
ALBUMIN: 3.7 g/dL (ref 3.5–5.0)
ALT: 17 U/L (ref 14–54)
ANION GAP: 8 (ref 5–15)
AST: 21 U/L (ref 15–41)
Alkaline Phosphatase: 85 U/L (ref 38–126)
BUN: 20 mg/dL (ref 6–20)
CO2: 24 mmol/L (ref 22–32)
Calcium: 8.8 mg/dL — ABNORMAL LOW (ref 8.9–10.3)
Chloride: 101 mmol/L (ref 101–111)
Creatinine, Ser: 1.04 mg/dL — ABNORMAL HIGH (ref 0.44–1.00)
GFR, EST AFRICAN AMERICAN: 60 mL/min — AB (ref >60)
GFR, EST NON AFRICAN AMERICAN: 52 mL/min — AB (ref >60)
Glucose, Bld: 138 mg/dL — ABNORMAL HIGH (ref 65–99)
POTASSIUM: 3.8 mmol/L (ref 3.5–5.1)
Sodium: 133 mmol/L — ABNORMAL LOW (ref 135–145)
TOTAL PROTEIN: 6.7 g/dL (ref 6.5–8.1)
Total Bilirubin: 0.7 mg/dL (ref 0.3–1.2)

## 2015-05-28 LAB — I-STAT CG4 LACTIC ACID, ED: Lactic Acid, Venous: 1.39 mmol/L (ref 0.5–2.0)

## 2015-05-28 MED ORDER — DEXTROSE 5 % IV SOLN
1.0000 g | Freq: Once | INTRAVENOUS | Status: AC
  Administered 2015-05-28: 1 g via INTRAVENOUS
  Filled 2015-05-28: qty 10

## 2015-05-28 MED ORDER — SODIUM CHLORIDE 0.9 % IV BOLUS (SEPSIS)
1000.0000 mL | Freq: Once | INTRAVENOUS | Status: AC
  Administered 2015-05-28: 1000 mL via INTRAVENOUS

## 2015-05-28 MED ORDER — ACETAMINOPHEN 325 MG PO TABS
650.0000 mg | ORAL_TABLET | Freq: Once | ORAL | Status: AC
  Administered 2015-05-28: 650 mg via ORAL
  Filled 2015-05-28: qty 2

## 2015-05-28 MED ORDER — CEPHALEXIN 500 MG PO CAPS: 500.0000 mg | ORAL_CAPSULE | Freq: Two times a day (BID) | ORAL | Status: DC

## 2015-05-28 NOTE — ED Notes (Signed)
Pt has restricted extremity, unable to obtain 2nd set of culture, I will notify EDP

## 2015-05-28 NOTE — ED Notes (Signed)
i clicked off the document v/s in error....informed the tech and nurse

## 2015-05-28 NOTE — ED Provider Notes (Signed)
CSN: [id number]     Arrival date & time 05/28/15  1753 History   First MD Initiated Contact with Patient 05/28/15 1812     Chief Complaint  Patient presents with  . Fever     Patient is a 74 y.o. female presenting with fever. The history is provided by the patient and the spouse. No language interpreter was used.  Fever  Ms. Sammons presents for evaluation of malaise.  She reports that she felt fine when she woke this morning around 8am.  At about 10am she developed malaise, nausea.  She went to urgent care and developed chills and was found to have a fever to 102 and was referred to the ED.  She did have frequent urination last night.  She reports generalized weakness and feeling poorly.  She denies any sore throat, otalgia, cough, abdominal pain, diarrhea, dysuria.  Sxs are moderate, constant, worsening.    Past Medical History  Diagnosis Date  . HYPERTENSION   . OSTEOPENIA   . HYPERLIPIDEMIA   . Allergic rhinitis   . H/O retinal detachment   . H/O acute pancreatitis   . Family history of anesthesia complication     daughter hard to wake up  . Cancer     breast -left  . Hurthle cell neoplasm of thyroid     Right    Past Surgical History  Procedure Laterality Date  . Tubal ligation    . Laser eye surgery, detached retina Left   . Breast biopsy Left 08/27/14  . Cholecystectomy  2000  . Tonsillectomy    . Dilation and curettage of uterus    . Colonoscopy    . Radioactive seed guided mastectomy with axillary sentinel lymph node biopsy Left 09/16/2014    Procedure: RADIOACTIVE SEED GUIDED PARTIAL MASTECTOMY WITH AXILLARY SENTINEL LYMPH NODE BIOPSY;  Surgeon: [name], MD;  Location: [place];  Service: General;  Laterality: Left;  . Thyroid lobectomy N/A 02/10/2015    Procedure: RIGHT THYROID LOBECTOMY;  Surgeon: [name], MD;  Location: [facility];  Service: General;  Laterality: N/A;   Family History  Problem Relation Age of Onset  . Colon cancer Neg Hx   .  CAD Neg Hx   . Diabetes Neg Hx   . Breast cancer Sister 35  . Cancer Father     lung cancer ; smoker  . Cancer Brother     3 brothers with lung cancer, all smokers  . Cancer Paternal Uncle     2 pat uncles with lung cancer, smokers  . Kidney cancer Sister 65  . Melanoma Sister 62  . Ovarian cancer Other 60    niece with ovarian cancer (related through sister with breast cancer)   History  Substance Use Topics  . Smoking status: Never Smoker   . Smokeless tobacco: Not on file  . Alcohol Use: No     Comment: rarely wine   OB History    No data available     Review of Systems  Constitutional: Positive for fever.  All other systems reviewed and are negative.     Allergies  Codeine; Carvedilol; Felodipine; Lisinopril; Losartan potassium; and Radiaplexrx  Home Medications   Prior to Admission medications   Medication Sig Start Date End Date Taking? Authorizing Provider  acetaminophen (TYLENOL) 500 MG tablet Take 500 mg by mouth every 6 (six) hours as needed for moderate pain or headache.     Historical Provider, MD  anastrozole (ARIMIDEX) 1 MG tablet  Take 1 tablet (1 mg total) by mouth daily. Patient taking differently: Take 1 mg by mouth every morning.  10/26/14   [name], MD  aspirin 81 MG EC tablet Take 81 mg by mouth every morning.     Historical Provider, MD  azithromycin (ZITHROMAX) 250 MG tablet As directed. 04/11/15   Historical Provider, MD  calcium carbonate 1250 MG capsule Take 2,500 mg by mouth every morning.     Historical Provider, MD  cholecalciferol (VITAMIN D) 1000 UNITS tablet Take 2,500 Units by mouth every morning.     Historical Provider, MD  dorzolamide-timolol (COSOPT) 22.3-6.8 MG/ML ophthalmic solution Place 1 drop into both eyes every morning.     Historical Provider, MD  emollient (BIAFINE) cream Apply 1 application topically every morning.     Historical Provider, MD  fluticasone (CUTIVATE) 0.005 % ointment Apply 1 application topically 3  (three) times daily as needed. Patient taking differently: Apply 1 application topically 3 (three) times daily as needed (eczema.).  08/05/14   Colon Branch, MD  fluticasone [facility]) 50 MCG/ACT nasal spray Place 2 sprays into the nose daily as needed. 11/28/11 03/18/15  Colon Branch, MD  hyaluronate sodium (RADIAPLEXRX) GEL Apply 1 application topically daily.     Historical Provider, MD  latanoprost (XALATAN) 0.005 % ophthalmic solution Place 1 drop into both eyes at bedtime.    Historical Provider, MD  levothyroxine (LEVOTHROID) 25 MCG tablet Take 1 tablet (25 mcg total) by mouth daily before breakfast. 04/19/15   Colon Branch, MD  lidocaine (LIDODERM) 5 % Place 1-2 patches onto the skin daily. Remove & Discard patch within 12 hours or as directed by MD 03/18/15   Colon Branch, MD  Misc Natural Products (FIBER 7 PO) Take 3 capsules by mouth daily.    Historical Provider, MD  Multiple Vitamin (MULTIVITAMIN) tablet Take 1 tablet by mouth every morning.     Historical Provider, MD  olmesartan (BENICAR) 40 MG tablet Take 1 tablet (40 mg total) by mouth daily. 05/18/15   Colon Branch, MD  pravastatin (PRAVACHOL) 40 MG tablet Take 1 tablet (40 mg total) by mouth daily. 05/18/15   Colon Branch, MD   BP 131/58 mmHg  Pulse 81  Temp(Src) 101.7 F (38.7 C) (Oral)  Resp 18  Ht 5\' 3"  (1.6 m)  Wt 221 lb (100.245 kg)  BMI 39.16 kg/m2  SpO2 97% Physical Exam  Constitutional: She is oriented to person, place, and time. She appears well-developed and well-nourished.  HENT:  Head: Normocephalic and atraumatic.  Cardiovascular: Normal rate and regular rhythm.   No murmur heard. Pulmonary/Chest: Effort normal and breath sounds normal. No respiratory distress.  Abdominal: Soft. There is no tenderness. There is no rebound and no guarding.  Musculoskeletal: She exhibits no edema or tenderness.  Neurological: She is alert and oriented to person, place, and time.  Skin: Skin is warm and dry.  Psychiatric: She has a normal mood  and affect. Her behavior is normal.  Nursing note and vitals reviewed.   ED Course  Procedures (including critical care time) Labs Review Labs Reviewed  COMPREHENSIVE METABOLIC PANEL - Abnormal; Notable for the following:    Sodium 133 (*)    Glucose, Bld 138 (*)    Creatinine, Ser 1.04 (*)    Calcium 8.8 (*)    GFR calc non Af Amer 52 (*)    GFR calc Af Amer 60 (*)    All other components within normal limits  CBC WITH DIFFERENTIAL/PLATELET - Abnormal; Notable for the following:    Neutrophils Relative % 88 (*)    Neutro Abs 7.9 (*)    Lymphocytes Relative 7 (*)    Lymphs Abs 0.6 (*)    All other components within normal limits  URINALYSIS, ROUTINE W REFLEX MICROSCOPIC (NOT AT [facility]) - Abnormal; Notable for the following:    Leukocytes, UA SMALL (*)    All other components within normal limits  URINE MICROSCOPIC-ADD ON - Abnormal; Notable for the following:    Squamous Epithelial / LPF FEW (*)    All other components within normal limits  CULTURE, BLOOD (ROUTINE X 2)  URINE CULTURE  I-STAT CG4 LACTIC ACID, ED    Imaging Review Dg Chest 2 View  05/28/2015   CLINICAL DATA:  Fever, nausea and dizziness, beginning this morning. No chest pain. Mild cough. Left mastectomy for breast cancer previously.  EXAM: CHEST  2 VIEW  COMPARISON:  01/05/2014  FINDINGS: Artifact overlies the chest. Heart size is normal. Mediastinal shadows are normal. The lungs are clear. No pneumothorax or hemothorax. Previous mastectomy on the left. Previous surgery in the right lower neck. Ordinary degenerative changes affect the spine. No effusions.  IMPRESSION: No active disease.  Previous left mastectomy.   Electronically Signed   By: Nelson Chimes M.D.   On: 05/28/2015 19:28     EKG Interpretation None      MDM   Final diagnoses:  Febrile illness, acute    Patient here for evaluation of fever, nausea, malaise. She is febrile on ED arrival to 101.7. Patient does not appear septic. On repeat  evaluation patient is feeling improved. UA is concerning for possible developing urinary tract infection with leukocytes present. Will treat for possible early UTI. Cultures are pending. Patient has a benign abdominal examination, clear lung examination, no headache. Discussed with patient's close return precautions as well as home care for febrile illness and possible UTI.  Quintella Reichert, MD 05/28/15 [phone]

## 2015-05-28 NOTE — ED Notes (Signed)
Nurse drawing labs. 

## 2015-05-28 NOTE — Discharge Instructions (Signed)
Fever, Adult °A fever is a higher than normal body temperature. In an adult, an oral temperature around 98.6° F (37° C) is considered normal. A temperature of 100.4° F (38° C) or higher is generally considered a fever. Mild or moderate fevers generally have no long-term effects and often do not require treatment. Extreme fever (greater than or equal to 106° F or 41.1° C) can cause seizures. The sweating that may occur with repeated or prolonged fever may cause dehydration. Elderly people can develop confusion during a fever. °A measured temperature can vary with: °· Age. °· Time of day. °· Method of measurement (mouth, underarm, rectal, or ear). °The fever is confirmed by taking a temperature with a thermometer. Temperatures can be taken different ways. Some methods are accurate and some are not. °· An oral temperature is used most commonly. Electronic thermometers are fast and accurate. °· An ear temperature will only be accurate if the thermometer is positioned as recommended by the manufacturer. °· A rectal temperature is accurate and done for those adults who have a condition where an oral temperature cannot be taken. °· An underarm (axillary) temperature is not accurate and not recommended. °Fever is a symptom, not a disease.  °CAUSES  °· Infections commonly cause fever. °· Some noninfectious causes for fever include: °¨ Some arthritis conditions. °¨ Some thyroid or adrenal gland conditions. °¨ Some immune system conditions. °¨ Some types of cancer. °¨ A medicine reaction. °¨ High doses of certain street drugs such as methamphetamine. °¨ Dehydration. °¨ Exposure to high outside or room temperatures. °· Occasionally, the source of a fever cannot be determined. This is sometimes called a "fever of unknown origin" (FUO). °· Some situations may lead to a temporary rise in body temperature that may go away on its own. Examples are: °¨ Childbirth. °¨ Surgery. °¨ Intense exercise. °HOME CARE INSTRUCTIONS  °· Take  appropriate medicines for fever. Follow dosing instructions carefully. If you use acetaminophen to reduce the fever, be careful to avoid taking other medicines that also contain acetaminophen. Do not take aspirin for a fever if you are younger than age 19. There is an association with Reye's syndrome. Reye's syndrome is a rare but potentially deadly disease. °· If an infection is present and antibiotics have been prescribed, take them as directed. Finish them even if you start to feel better. °· Rest as needed. °· Maintain an adequate fluid intake. To prevent dehydration during an illness with prolonged or recurrent fever, you may need to drink extra fluid. Drink enough fluids to keep your urine clear or pale yellow. °· Sponging or bathing with room temperature water may help reduce body temperature. Do not use ice water or alcohol sponge baths. °· Dress comfortably, but do not over-bundle. °SEEK MEDICAL CARE IF:  °· You are unable to keep fluids down. °· You develop vomiting or diarrhea. °· You are not feeling at least partly better after 3 days. °· You develop new symptoms or problems. °SEEK IMMEDIATE MEDICAL CARE IF:  °· You have shortness of breath or trouble breathing. °· You develop excessive weakness. °· You are dizzy or you faint. °· You are extremely thirsty or you are making little or no urine. °· You develop new pain that was not there before (such as in the head, neck, chest, back, or abdomen). °· You have persistent vomiting and diarrhea for more than 1 to 2 days. °· You develop a stiff neck or your eyes become sensitive to light. °· You develop a   skin rash.  You have a fever or persistent symptoms for more than 2 to 3 days.  You have a fever and your symptoms suddenly get worse. MAKE SURE YOU:  Understand these instructions.Urinary Tract Infection Urinary tract infections (UTIs) can develop anywhere along your urinary tract. Your urinary tract is your body's drainage system for removing wastes  and extra water. Your urinary tract includes two kidneys, two ureters, a bladder, and a urethra. Your kidneys are a pair of bean-shaped organs. Each kidney is about the size of your fist. They are located below your ribs, one on each side of your spine. CAUSES Infections are caused by microbes, which are microscopic organisms, including fungi, viruses, and bacteria. These organisms are so small that they can only be seen through a microscope. Bacteria are the microbes that most commonly cause UTIs. SYMPTOMS  Symptoms of UTIs may vary by age and gender of the patient and by the location of the infection. Symptoms in young women typically include a frequent and intense urge to urinate and a painful, burning feeling in the bladder or urethra during urination. Older women and men are more likely to be tired, shaky, and weak and have muscle aches and abdominal pain. A fever may mean the infection is in your kidneys. Other symptoms of a kidney infection include pain in your back or sides below the ribs, nausea, and vomiting. DIAGNOSIS To diagnose a UTI, your caregiver will ask you about your symptoms. Your caregiver also will ask to provide a urine sample. The urine sample will be tested for bacteria and white blood cells. White blood cells are made by your body to help fight infection. TREATMENT  Typically, UTIs can be treated with medication. Because most UTIs are caused by a bacterial infection, they usually can be treated with the use of antibiotics. The choice of antibiotic and length of treatment depend on your symptoms and the type of bacteria causing your infection. HOME CARE INSTRUCTIONS If you were prescribed antibiotics, take them exactly as your caregiver instructs you. Finish the medication even if you feel better after you have only taken some of the medication. Drink enough water and fluids to keep your urine clear or pale yellow. Avoid caffeine, tea, and carbonated beverages. They tend to  irritate your bladder. Empty your bladder often. Avoid holding urine for long periods of time. Empty your bladder before and after sexual intercourse. After a bowel movement, women should cleanse from front to back. Use each tissue only once. SEEK MEDICAL CARE IF:  You have back pain. You develop a fever. Your symptoms do not begin to resolve within 3 days. SEEK IMMEDIATE MEDICAL CARE IF:  You have severe back pain or lower abdominal pain. You develop chills. You have nausea or vomiting. You have continued burning or discomfort with urination. MAKE SURE YOU:  Understand these instructions. Will watch your condition. Will get help right away if you are not doing well or get worse. Document Released: 08/22/2005 Document Revised: 05/13/2012 Document Reviewed: 12/21/2011 [facility] Patient Information 2015 [place], [place]. This information is not intended to replace advice given to you by your health care provider. Make sure you discuss any questions you have with your health care provider.    Will watch your condition.  Will get help right away if you are not doing well or get worse. Document Released: 05/08/2001 Document Revised: 03/29/2014 Document Reviewed: 09/13/2011 [facility] Patient Information 2015 [place], [place]. This information is not intended to replace advice given to you  by your health care provider. Make sure you discuss any questions you have with your health care provider. ° °

## 2015-05-28 NOTE — ED Notes (Addendum)
Pt reported having fever (102.0), nausea and dizziness. Pt denies lower abd pain but was void "a lot" last night. Pt reported urinary urgency/frequency, no burning/dysuria. No visual disturbances and headache. Pt was sent from [facility].

## 2015-05-29 ENCOUNTER — Emergency Department (HOSPITAL_COMMUNITY)
Admission: EM | Admit: 2015-05-29 | Discharge: 2015-05-29 | Disposition: A | Discharge status: Home / Self Care | Payer: Medicare Other | Attending: Emergency Medicine | Admitting: Emergency Medicine

## 2015-05-29 ENCOUNTER — Encounter (HOSPITAL_COMMUNITY): Payer: Self-pay

## 2015-05-29 DIAGNOSIS — I10 Essential (primary) hypertension: Secondary | ICD-10-CM | POA: Diagnosis not present

## 2015-05-29 DIAGNOSIS — Z9889 Other specified postprocedural states: Secondary | ICD-10-CM | POA: Diagnosis not present

## 2015-05-29 DIAGNOSIS — Z85850 Personal history of malignant neoplasm of thyroid: Secondary | ICD-10-CM | POA: Diagnosis not present

## 2015-05-29 DIAGNOSIS — Z79899 Other long term (current) drug therapy: Secondary | ICD-10-CM | POA: Diagnosis not present

## 2015-05-29 DIAGNOSIS — R509 Fever, unspecified: Secondary | ICD-10-CM | POA: Diagnosis not present

## 2015-05-29 DIAGNOSIS — N61 Inflammatory disorders of breast: Secondary | ICD-10-CM | POA: Diagnosis not present

## 2015-05-29 DIAGNOSIS — N644 Mastodynia: Secondary | ICD-10-CM | POA: Diagnosis present

## 2015-05-29 DIAGNOSIS — Z853 Personal history of malignant neoplasm of breast: Secondary | ICD-10-CM | POA: Diagnosis not present

## 2015-05-29 DIAGNOSIS — Z8719 Personal history of other diseases of the digestive system: Secondary | ICD-10-CM | POA: Insufficient documentation

## 2015-05-29 DIAGNOSIS — Z7982 Long term (current) use of aspirin: Secondary | ICD-10-CM | POA: Diagnosis not present

## 2015-05-29 DIAGNOSIS — E785 Hyperlipidemia, unspecified: Secondary | ICD-10-CM | POA: Diagnosis not present

## 2015-05-29 MED ORDER — CEPHALEXIN 500 MG PO CAPS: 500.0000 mg | ORAL_CAPSULE | Freq: Four times a day (QID) | ORAL | Status: DC

## 2015-05-29 MED ORDER — SULFAMETHOXAZOLE-TRIMETHOPRIM 800-160 MG PO TABS: 1.0000 | ORAL_TABLET | Freq: Three times a day (TID) | ORAL | Status: AC

## 2015-05-29 NOTE — ED Notes (Signed)
Awake. Verbally responsive. A/O x4. Resp even and unlabored. No audible adventitious breath sounds noted. ABC's intact.  

## 2015-05-29 NOTE — Discharge Instructions (Signed)

## 2015-05-29 NOTE — ED Provider Notes (Signed)
CSN: [id number]     Arrival date & time 05/29/15  1250 History   First MD Initiated Contact with Patient 05/29/15 1351     Chief Complaint  Patient presents with  . Breast Pain     (Consider location/radiation/quality/duration/timing/severity/associated sxs/prior Treatment) The history is provided by the patient.   patient comes in with pain and swelling in her right breast. She was seen in the ER yesterday for fever that was thought to potentially be from urine source. Patient states the doctor told her she was heading towards a urinary tract infection. She was given Keflex. Patient states that her breast was not hurting then. No more fevers. No dysuria. States she did have some urinary frequency initially but has not had any since.  Past Medical History  Diagnosis Date  . HYPERTENSION   . OSTEOPENIA   . HYPERLIPIDEMIA   . Allergic rhinitis   . H/O retinal detachment   . H/O acute pancreatitis   . Family history of anesthesia complication     daughter hard to wake up  . Cancer     breast -left  . Hurthle cell neoplasm of thyroid     Right    Past Surgical History  Procedure Laterality Date  . Tubal ligation    . Laser eye surgery, detached retina Left   . Breast biopsy Left 08/27/14  . Cholecystectomy  2000  . Tonsillectomy    . Dilation and curettage of uterus    . Colonoscopy    . Radioactive seed guided mastectomy with axillary sentinel lymph node biopsy Left 09/16/2014    Procedure: RADIOACTIVE SEED GUIDED PARTIAL MASTECTOMY WITH AXILLARY SENTINEL LYMPH NODE BIOPSY;  Surgeon: [name], MD;  Location: [place];  Service: General;  Laterality: Left;  . Thyroid lobectomy N/A 02/10/2015    Procedure: RIGHT THYROID LOBECTOMY;  Surgeon: [name], MD;  Location: [facility];  Service: General;  Laterality: N/A;   Family History  Problem Relation Age of Onset  . Colon cancer Neg Hx   . CAD Neg Hx   . Diabetes Neg Hx   . Breast cancer Sister 57  . Cancer  Father     lung cancer ; smoker  . Cancer Brother     3 brothers with lung cancer, all smokers  . Cancer Paternal Uncle     2 pat uncles with lung cancer, smokers  . Kidney cancer Sister 41  . Melanoma Sister 77  . Ovarian cancer Other 73    niece with ovarian cancer (related through sister with breast cancer)   History  Substance Use Topics  . Smoking status: Never Smoker   . Smokeless tobacco: Not on file  . Alcohol Use: No     Comment: rarely wine   OB History    No data available     Review of Systems  Constitutional: Positive for fever. Negative for activity change and appetite change.  Eyes: Negative for pain.  Respiratory: Negative for chest tightness and shortness of breath.   Cardiovascular: Negative for chest pain and leg swelling.  Gastrointestinal: Negative for nausea, vomiting, abdominal pain and diarrhea.  Genitourinary: Negative for flank pain.  Musculoskeletal: Negative for back pain and neck stiffness.  Skin: Positive for color change. Negative for rash.  Neurological: Negative for weakness, numbness and headaches.  Psychiatric/Behavioral: Negative for behavioral problems.      Allergies  Codeine; Carvedilol; Felodipine; Lisinopril; Losartan potassium; and Radiaplexrx  Home Medications   Prior to Admission medications  Medication Sig Start Date End Date Taking? Authorizing Provider  acetaminophen (TYLENOL) 650 MG CR tablet Take 650 mg by mouth every 8 (eight) hours as needed for pain.   Yes Historical Provider, MD  anastrozole (ARIMIDEX) 1 MG tablet Take 1 tablet (1 mg total) by mouth daily. Patient taking differently: Take 1 mg by mouth every morning.  10/26/14  Yes [name], MD  aspirin 81 MG EC tablet Take 81 mg by mouth every morning.    Yes Historical Provider, MD  calcium carbonate 1250 MG capsule Take 2,500 mg by mouth every morning.    Yes Historical Provider, MD  cholecalciferol (VITAMIN D) 1000 UNITS tablet Take 2,500 Units by mouth  every morning.    Yes Historical Provider, MD  dorzolamide-timolol (COSOPT) 22.3-6.8 MG/ML ophthalmic solution Place 1 drop into both eyes every morning.    Yes Historical Provider, MD  fluticasone (CUTIVATE) 0.005 % ointment Apply 1 application topically 3 (three) times daily as needed. Patient taking differently: Apply 1 application topically 3 (three) times daily as needed (eczema.).  08/05/14  Yes Colon Branch, MD  latanoprost (XALATAN) 0.005 % ophthalmic solution Place 1 drop into both eyes at bedtime.   Yes Historical Provider, MD  levothyroxine (LEVOTHROID) 25 MCG tablet Take 1 tablet (25 mcg total) by mouth daily before breakfast. 04/19/15  Yes Colon Branch, MD  Multiple Vitamin (MULTIVITAMIN) tablet Take 1 tablet by mouth every morning.    Yes Historical Provider, MD  olmesartan (BENICAR) 40 MG tablet Take 1 tablet (40 mg total) by mouth daily. 05/18/15  Yes Colon Branch, MD  pravastatin (PRAVACHOL) 40 MG tablet Take 1 tablet (40 mg total) by mouth daily. 05/18/15  Yes Colon Branch, MD  cephALEXin (KEFLEX) 500 MG capsule Take 1 capsule (500 mg total) by mouth 4 (four) times daily. Take this with the 14 pills that you have already for a 7 day supply 05/29/15   Davonna Belling, MD  lidocaine (LIDODERM) 5 % Place 1-2 patches onto the skin daily. Remove & Discard patch within 12 hours or as directed by MD Patient not taking: Reported on 05/29/2015 03/18/15   Colon Branch, MD  sulfamethoxazole-trimethoprim (BACTRIM DS,SEPTRA DS) 800-160 MG per tablet Take 1 tablet by mouth 3 (three) times daily. 05/29/15 06/05/15  Davonna Belling, MD   BP 114/41 mmHg  Pulse 63  Temp(Src) 98.2 F (36.8 C) (Oral)  Resp 18  SpO2 98% Physical Exam  Constitutional: She appears well-developed and well-nourished.  HENT:  Head: Normocephalic.  Cardiovascular: Normal rate.   Pulmonary/Chest: Effort normal.  Redness of the right breast. Around the nipple with some firmness behind it. No drainage. No fluctuance.  Abdominal: Soft.   Neurological: She is alert.  Skin: Skin is warm.    ED Course  Procedures (including critical care time) Labs Review Labs Reviewed - No data to display  Imaging Review Dg Chest 2 View  05/28/2015   CLINICAL DATA:  Fever, nausea and dizziness, beginning this morning. No chest pain. Mild cough. Left mastectomy for breast cancer previously.  EXAM: CHEST  2 VIEW  COMPARISON:  01/05/2014  FINDINGS: Artifact overlies the chest. Heart size is normal. Mediastinal shadows are normal. The lungs are clear. No pneumothorax or hemothorax. Previous mastectomy on the left. Previous surgery in the right lower neck. Ordinary degenerative changes affect the spine. No effusions.  IMPRESSION: No active disease.  Previous left mastectomy.   Electronically Signed   By: Nelson Chimes M.D.   On:  05/28/2015 19:28     EKG Interpretation None      MDM   Final diagnoses:  Mastitis    Patient with breast cellulitis versus abscess. Will increase patient's Keflex to 4 times a day and add Bactrim. Does not appear to need a drainage right now. Patient has seen the breast Center previously for her breast cancer all follow-up on Monday. If patient begins he had fevers again or the redness worsen she will return to the ER before. Urine culture negative so far   Davonna Belling, MD 05/29/15 [phone]

## 2015-05-29 NOTE — ED Notes (Signed)
Pt c/o R breast pain since last night.  Pain score 8/10.  Pt reports being seen last night for fever and dizziness and told that she had a UTI.  Sts that breast was sore prior to visit last night.  Redness noted around nipple.  Denies discharge.

## 2015-05-29 NOTE — ED Notes (Signed)
Pt reporting redness and swelling around rt breast. No discharge from nipple noted.

## 2015-05-30 LAB — URINE CULTURE

## 2015-05-31 ENCOUNTER — Telehealth: Payer: Self-pay | Admitting: Internal Medicine

## 2015-05-31 ENCOUNTER — Encounter: Payer: Self-pay | Admitting: Internal Medicine

## 2015-05-31 ENCOUNTER — Ambulatory Visit (INDEPENDENT_AMBULATORY_CARE_PROVIDER_SITE_OTHER): Payer: Medicare Other | Admitting: Internal Medicine

## 2015-05-31 VITALS — BP 124/76 | HR 56 | Temp 98.0°F | Ht 63.0 in | Wt 219.2 lb

## 2015-05-31 DIAGNOSIS — E890 Postprocedural hypothyroidism: Secondary | ICD-10-CM | POA: Diagnosis not present

## 2015-05-31 DIAGNOSIS — N61 Inflammatory disorders of breast: Secondary | ICD-10-CM

## 2015-05-31 DIAGNOSIS — E039 Hypothyroidism, unspecified: Secondary | ICD-10-CM

## 2015-05-31 HISTORY — DX: Hypothyroidism, unspecified: E03.9

## 2015-05-31 LAB — TSH: TSH: 3.73 u[IU]/mL (ref 0.35–4.50)

## 2015-05-31 NOTE — Assessment & Plan Note (Signed)
Last TSH is slightly increased, history of partial thyroidectomy. Now on Synthroid, check labs

## 2015-05-31 NOTE — Progress Notes (Signed)
Subjective:    Patient ID: Lori Joseph, female    DOB: Sep 28, 1941, 74 y.o.   MRN: [id number]  DOS:  05/31/2015 Type of visit - description : ER follow-up Interval history: Since the last office visit went to the ER twice, notes reviewed: Seen w/ Fever, nausea, malaise. Temperature was 101.7. UA showed possible early UTI. Was prescribed Keflex empirically. She was seen the next day 05/29/2015 with no swelling at the right breast. Was diagnosed with cellulitis versus abscess the breast. The and Bactrim to Keflex. Workup included:  Urine culture negative, blood cultures negative CBC showed normal white count Chest x-ray unremarkable   Review of Systems Since the last ER visit, she is taking the 2 abx as recommended. No fever or chills in the last 2 days, R breast swelling and pain is slightly decreased. Pain has decreased from 8 to 6/10. She has not seen any discharge.   Past Medical History  Diagnosis Date  . HYPERTENSION   . OSTEOPENIA   . HYPERLIPIDEMIA   . Allergic rhinitis   . H/O retinal detachment   . H/O acute pancreatitis   . Family history of anesthesia complication     daughter hard to wake up  . Cancer     breast -left  . Hurthle cell neoplasm of thyroid     Right     Past Surgical History  Procedure Laterality Date  . Tubal ligation    . Laser eye surgery, detached retina Left   . Breast biopsy Left 08/27/14  . Cholecystectomy  2000  . Tonsillectomy    . Dilation and curettage of uterus    . Colonoscopy    . Radioactive seed guided mastectomy with axillary sentinel lymph node biopsy Left 09/16/2014    Procedure: RADIOACTIVE SEED GUIDED PARTIAL MASTECTOMY WITH AXILLARY SENTINEL LYMPH NODE BIOPSY;  Surgeon: [name], MD;  Location: [facility];  Service: General;  Laterality: Left;  . Thyroid lobectomy N/A 02/10/2015    Procedure: RIGHT THYROID LOBECTOMY;  Surgeon: [name], MD;  Location: [facility];  Service: General;  Laterality: N/A;     History   Social History  . Marital Status: Married    Spouse Name: Ruthann Cancer  . Number of Children: 2  . 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 Smoking status: Never Smoker   . Smokeless tobacco: Not on file  . Alcohol Use: No     Comment: rarely wine  . Drug Use: No  . Sexual Activity: Not on file   Other Topics Concern  . Not on file   Social History Narrative   Lives w/ husband        Medication List       This list is accurate as of: 05/31/15 11:51 AM.  Always use your most recent med list.               acetaminophen 650 MG CR tablet  Commonly known as:  TYLENOL  Take 650 mg by mouth every 8 (eight) hours as needed for pain.     anastrozole 1 MG tablet  Commonly known as:  ARIMIDEX  Take 1 tablet (1 mg total) by mouth daily.     aspirin 81 MG EC tablet  Take 81 mg by mouth every morning.     calcium carbonate 1250 MG capsule  Take 2,500 mg by mouth every morning.  cephALEXin 500 MG capsule  Commonly known as:  KEFLEX  Take 1 capsule (500 mg total) by mouth 4 (four) times daily. Take this with the 14 pills that you have already for a 7 day supply     cholecalciferol 1000 UNITS tablet  Commonly known as:  VITAMIN D  Take 2,500 Units by mouth every morning.     dorzolamide-timolol 22.3-6.8 MG/ML ophthalmic solution  Commonly known as:  COSOPT  Place 1 drop into both eyes every morning.     fluticasone 0.005 % ointment  Commonly known as:  CUTIVATE  Apply 1 application topically 3 (three) times daily as needed.     latanoprost 0.005 % ophthalmic solution  Commonly known as:  XALATAN  Place 1 drop into both eyes at bedtime.     levothyroxine 25 MCG tablet  Commonly known as:  LEVOTHROID  Take 1 tablet (25 mcg total) by mouth daily before breakfast.     lidocaine 5 %  Commonly known as:  LIDODERM  Place 1-2 patches onto the skin daily. Remove & Discard patch  within 12 hours or as directed by MD     multivitamin tablet  Take 1 tablet by mouth every morning.     olmesartan 40 MG tablet  Commonly known as:  BENICAR  Take 1 tablet (40 mg total) by mouth daily.     pravastatin 40 MG tablet  Commonly known as:  PRAVACHOL  Take 1 tablet (40 mg total) by mouth daily.     sulfamethoxazole-trimethoprim 800-160 MG per tablet  Commonly known as:  BACTRIM DS,SEPTRA DS  Take 1 tablet by mouth 3 (three) times daily.           Objective:   Physical Exam  Constitutional: She appears well-developed. No distress.  Skin: She is not diaphoretic.     Psychiatric: She has a normal mood and affect. Her behavior is normal. Judgment and thought content normal.   BP 124/76 mmHg  Pulse 56  Temp(Src) 98 F (36.7 C) (Oral)  Ht 5\' 3"  (1.6 m)  Wt 219 lb 4 oz (99.451 kg)  BMI 38.85 kg/m2  SpO2 96%       Assessment & Plan:    Cellulitis, right breast No evidence of abscess. Discontinue cephalexin Continue with Bactrim, it was prescribed 3 times a day, recommend to stay on twice a day for one week.  see  instructions

## 2015-05-31 NOTE — Telephone Encounter (Signed)
Yes please, needs  appointment to be seen here first ,I can see her today

## 2015-05-31 NOTE — Telephone Encounter (Signed)
Pt is going to need ED F/U per Dr. Larose Kells, preferably today for [name] to be able to send ED notes and his notes over for referral. Please have Pt make appt at her convenience.

## 2015-05-31 NOTE — Patient Instructions (Addendum)
Get your blood work before you leave     Stop cephalexin Continue Bactrim twice a day only for the next week  If the original back to normal at the end of the treatment please let me know

## 2015-05-31 NOTE — Telephone Encounter (Signed)
LVM advising pt to schedule appointment

## 2015-05-31 NOTE — Telephone Encounter (Signed)
Pt was seen today at 11:30 already.

## 2015-05-31 NOTE — Telephone Encounter (Signed)
Caller name: Lenola Lockner Relationship to patient: spouse Can be reached: [phone], pts cell phone  Reason for call: Pt was in ER twice this weekend for MRSA & mastitis. Hospital said they would forward chart info to [facility] on [address] so pt could make appt but they have not. Pt is asking that we forward information and place referral. She is wanting to get in asap. Please notify pt when referral is sent in.

## 2015-05-31 NOTE — Telephone Encounter (Signed)
Please advise, should Pt be seen here first?

## 2015-05-31 NOTE — Progress Notes (Signed)
Pre visit review using our clinic review tool, if applicable. No additional management support is needed unless otherwise documented below in the visit note. 

## 2015-05-31 NOTE — ED Notes (Signed)
Chart accessed due to pt calling to inquire about [facility]. Advised pt to call her PCP according to chart note from Dr Larose Kells. Pt verbalized understanding.

## 2015-06-03 LAB — CULTURE, BLOOD (ROUTINE X 2): CULTURE: NO GROWTH

## 2015-06-06 DIAGNOSIS — D34 Benign neoplasm of thyroid gland: Secondary | ICD-10-CM | POA: Diagnosis not present

## 2015-06-06 DIAGNOSIS — C50412 Malignant neoplasm of upper-outer quadrant of left female breast: Secondary | ICD-10-CM | POA: Diagnosis not present

## 2015-06-15 ENCOUNTER — Other Ambulatory Visit: Payer: Self-pay | Admitting: Internal Medicine

## 2015-06-15 ENCOUNTER — Encounter: Payer: Self-pay | Admitting: Oncology

## 2015-06-16 ENCOUNTER — Encounter: Payer: Self-pay | Admitting: Radiation Oncology

## 2015-06-16 ENCOUNTER — Ambulatory Visit
Admission: RE | Admit: 2015-06-16 | Discharge: 2015-06-16 | Disposition: A | Discharge status: Home / Self Care | Payer: Medicare Other | Source: Ambulatory Visit | Attending: Radiation Oncology | Admitting: Radiation Oncology

## 2015-06-16 VITALS — BP 149/84 | HR 58 | Temp 98.2°F | Resp 16 | Ht 63.0 in | Wt 221.8 lb

## 2015-06-16 DIAGNOSIS — C50412 Malignant neoplasm of upper-outer quadrant of left female breast: Secondary | ICD-10-CM

## 2015-06-16 NOTE — Progress Notes (Signed)
Lori Joseph here for follow up.  She denies pain and said she had a shot in her lower back and has not had any more pain in that area.  She reports she had a staph infection in her right breast 3 weeks ago and was hospitalized.  She is taking anastrozole.  She reports her energy level is improving.  The skin on her left breast is intact.  The left nipple is more inverted than the right.  BP 149/84 mmHg  Pulse 58  Temp(Src) 98.2 F (36.8 C) (Oral)  Resp 16  Ht 5\' 3"  (1.6 m)  Wt 221 lb 12.8 oz (100.608 kg)  BMI 39.30 kg/m2

## 2015-06-16 NOTE — Progress Notes (Signed)
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   Interval Since Last Radiation:  5  months  Narrative:  The patient returns today for routine follow-up. She denies pain and reports her energy level is improving.  The skin on her left breast is intact, yet the left nipple is more inverted than the right. In her most recent hospital visit, she presented with a 101 fever and was diagnosed with a staff infection of the right breast via her primary care physician. She vocally expressed that she has since smoothly recovered and that she is feeling well enough to make a trip to the beach tomorrow. The patient is currently remaining on her Anastrozole medication, and experiencing no noticeable symptoms at this time.                     ALLERGIES:  is allergic to codeine; carvedilol; felodipine; lisinopril; losartan potassium; and radiaplexrx.  Meds: Current Outpatient Prescriptions  Medication Sig Dispense Refill  . anastrozole (ARIMIDEX) 1 MG tablet Take 1 tablet (1 mg total) by mouth daily. (Patient taking differently: Take 1 mg by mouth every morning. ) 90 tablet 3  . aspirin 81 MG EC tablet Take 81 mg by mouth every morning.     . calcium carbonate 1250 MG capsule Take 2,500 mg by mouth every morning.     . cholecalciferol (VITAMIN D) 1000 UNITS tablet Take 2,500 Units by mouth every morning.     . dorzolamide-timolol (COSOPT) 22.3-6.8 MG/ML ophthalmic solution Place 1 drop into both eyes every morning.     . latanoprost (XALATAN) 0.005 % ophthalmic solution Place 1 drop into both eyes at bedtime.    Marland Kitchen levothyroxine (SYNTHROID, LEVOTHROID) 25 MCG tablet Take 1 tablet (25 mcg total) by mouth daily before breakfast. 30  tablet 3  . Multiple Vitamin (MULTIVITAMIN) tablet Take 1 tablet by mouth every morning.     . olmesartan (BENICAR) 40 MG tablet Take 1 tablet (40 mg total) by mouth daily. 90 tablet 1  . pravastatin (PRAVACHOL) 40 MG tablet Take 1 tablet (40 mg total) by mouth daily. 30 tablet 6  . acetaminophen (TYLENOL) 650 MG CR tablet Take 650 mg by mouth every 8 (eight) hours as needed for pain.    . fluticasone (CUTIVATE) 0.005 % ointment Apply 1 application topically 3 (three) times daily as needed. (Patient not taking: Reported on 06/16/2015) 30 g 3  . lidocaine (LIDODERM) 5 % Place 1-2 patches onto the skin daily. Remove & Discard patch within 12 hours or as directed by MD (Patient not taking: Reported on 05/29/2015) 30 patch 0   No current facility-administered medications for this encounter.    Physical Findings: The patient is in no acute distress. Patient is alert and oriented X3.  height is 5\' 3"  (1.6 m) and weight is 221 lb 12.8 oz (100.608 kg). Her oral temperature is 98.2 F (36.8 C). Her blood pressure is 149/84 and her pulse is 58. Her respiration is 16. .  No significant changes are noted at this time. Lungs are clear. Heart has regular rate and rhythm. No palpable cervical, supraclavicular, or axillary adenopathy. No palpable mass nipple discharge  or bleeding involving the right breast. The left breast is some mild edema from her radiation treatments. The left nipple is mildly inverted consistent with swelling in the breast. No dominant masses appreciated nipple discharge or bleeding.   Lab Findings: Lab Results  Component Value Date   WBC 8.9 05/28/2015   HGB 12.0 05/28/2015   HCT 36.6 05/28/2015   MCV 94.1 05/28/2015   PLT 155 05/28/2015    Radiographic Findings: Dg Chest 2 View  05/28/2015   CLINICAL DATA:  Fever, nausea and dizziness, beginning this morning. No chest pain. Mild cough. Left mastectomy for breast cancer previously.  EXAM: CHEST  2 VIEW  COMPARISON:  01/05/2014   FINDINGS: Artifact overlies the chest. Heart size is normal. Mediastinal shadows are normal. The lungs are clear. No pneumothorax or hemothorax. Previous mastectomy on the left. Previous surgery in the right lower neck. Ordinary degenerative changes affect the spine. No effusions.  IMPRESSION: No active disease.  Previous left mastectomy.   Electronically Signed   By: Nelson Chimes M.D.   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 No evidence of recurrence on clinical exam today.  Routine mammography in the fall   Plan: The patient was advised of her follow up appointment with radiation oncology to take place as needed. She was made aware that she will be seen by medical oncology, moving forward. If she develops any further questions or concerns, she was encouraged to contact Dr. Sondra Come, PhD, MD.   This document serves as a record of services personally performed by Gery Pray, MD. It was created on his behalf by Lenn Cal, a trained medical scribe. The creation of this record is based on the scribe's personal observations and the provider's statements to them. This document has been checked and approved by the attending provider.    -----------------------------------  Blair Promise, PhD, MD

## 2015-06-20 ENCOUNTER — Encounter: Payer: Medicare Other | Admitting: Internal Medicine

## 2015-06-23 DIAGNOSIS — Z9889 Other specified postprocedural states: Secondary | ICD-10-CM | POA: Diagnosis not present

## 2015-06-23 DIAGNOSIS — H40003 Preglaucoma, unspecified, bilateral: Secondary | ICD-10-CM | POA: Diagnosis not present

## 2015-06-23 DIAGNOSIS — H43811 Vitreous degeneration, right eye: Secondary | ICD-10-CM | POA: Diagnosis not present

## 2015-07-20 DIAGNOSIS — H40003 Preglaucoma, unspecified, bilateral: Secondary | ICD-10-CM | POA: Diagnosis not present

## 2015-08-16 ENCOUNTER — Encounter: Payer: Self-pay | Admitting: Internal Medicine

## 2015-08-24 ENCOUNTER — Encounter: Payer: Self-pay | Admitting: Internal Medicine

## 2015-08-24 ENCOUNTER — Ambulatory Visit (INDEPENDENT_AMBULATORY_CARE_PROVIDER_SITE_OTHER): Payer: Medicare Other | Admitting: Internal Medicine

## 2015-08-24 VITALS — BP 126/70 | HR 60 | Temp 97.6°F | Ht 63.0 in | Wt 221.1 lb

## 2015-08-24 DIAGNOSIS — E785 Hyperlipidemia, unspecified: Secondary | ICD-10-CM

## 2015-08-24 DIAGNOSIS — R739 Hyperglycemia, unspecified: Secondary | ICD-10-CM

## 2015-08-24 DIAGNOSIS — Z09 Encounter for follow-up examination after completed treatment for conditions other than malignant neoplasm: Secondary | ICD-10-CM

## 2015-08-24 DIAGNOSIS — Z0000 Encounter for general adult medical examination without abnormal findings: Secondary | ICD-10-CM | POA: Diagnosis not present

## 2015-08-24 DIAGNOSIS — E039 Hypothyroidism, unspecified: Secondary | ICD-10-CM

## 2015-08-24 DIAGNOSIS — Z23 Encounter for immunization: Secondary | ICD-10-CM

## 2015-08-24 LAB — TSH: TSH: 3.51 u[IU]/mL (ref 0.35–4.50)

## 2015-08-24 LAB — LIPID PANEL
Cholesterol: 169 mg/dL (ref 0–200)
HDL: 55.8 mg/dL (ref >39.00)
LDL CALC: 94 mg/dL (ref 0–99)
NonHDL: 112.72
TRIGLYCERIDES: 94 mg/dL (ref 0.0–149.0)
Total CHOL/HDL Ratio: 3
VLDL: 18.8 mg/dL (ref 0.0–40.0)

## 2015-08-24 LAB — HEMOGLOBIN A1C: Hgb A1c MFr Bld: 5.7 % (ref 4.6–6.5)

## 2015-08-24 NOTE — Progress Notes (Signed)
Pre visit review using our clinic review tool, if applicable. No additional management support is needed unless otherwise documented below in the visit note. 

## 2015-08-24 NOTE — Patient Instructions (Addendum)
Get your blood work before you leave     Next visit in 6 months, routine checkup, (30 minutes), no fasting.     Please consider visit these websites for more information:  www.begintheconversation.org  theconversationproject.org    Fall Prevention and Home Safety Falls cause injuries and can affect all age groups. It is possible to use preventive measures to significantly decrease the likelihood of falls. There are many simple measures which can make your home safer and prevent falls. OUTDOORS  Repair cracks and edges of walkways and driveways.  Remove high doorway thresholds.  Trim shrubbery on the main path into your home.  Have good outside lighting.  Clear walkways of tools, rocks, debris, and clutter.  Check that handrails are not broken and are securely fastened. Both sides of steps should have handrails.  Have leaves, snow, and ice cleared regularly.  Use sand or salt on walkways during winter months.  In the garage, clean up grease or oil spills. BATHROOM  Install night lights.  Install grab bars by the toilet and in the tub and shower.  Use non-skid mats or decals in the tub or shower.  Place a plastic non-slip stool in the shower to sit on, if needed.  Keep floors dry and clean up all water on the floor immediately.  Remove soap buildup in the tub or shower on a regular basis.  Secure bath mats with non-slip, double-sided rug tape.  Remove throw rugs and tripping hazards from the floors. BEDROOMS  Install night lights.  Make sure a bedside light is easy to reach.  Do not use oversized bedding.  Keep a telephone by your bedside.  Have a firm chair with side arms to use for getting dressed.  Remove throw rugs and tripping hazards from the floor. KITCHEN  Keep handles on pots and pans turned toward the center of the stove. Use back burners when possible.  Clean up spills quickly and allow time for drying.  Avoid walking on wet  floors.  Avoid hot utensils and knives.  Position shelves so they are not too high or low.  Place commonly used objects within easy reach.  If necessary, use a sturdy step stool with a grab bar when reaching.  Keep electrical cables out of the way.  Do not use floor polish or wax that makes floors slippery. If you must use wax, use non-skid floor wax.  Remove throw rugs and tripping hazards from the floor. STAIRWAYS  Never leave objects on stairs.  Place handrails on both sides of stairways and use them. Fix any loose handrails. Make sure handrails on both sides of the stairways are as long as the stairs.  Check carpeting to make sure it is firmly attached along stairs. Make repairs to worn or loose carpet promptly.  Avoid placing throw rugs at the top or bottom of stairways, or properly secure the rug with carpet tape to prevent slippage. Get rid of throw rugs, if possible.  Have an electrician put in a light switch at the top and bottom of the stairs. OTHER FALL PREVENTION TIPS  Wear low-heel or rubber-soled shoes that are supportive and fit well. Wear closed toe shoes.  When using a stepladder, make sure it is fully opened and both spreaders are firmly locked. Do not climb a closed stepladder.  Add color or contrast paint or tape to grab bars and handrails in your home. Place contrasting color strips on first and last steps.  Learn and use mobility aids  as needed. Install an electrical emergency response system.  Turn on lights to avoid dark areas. Replace light bulbs that burn out immediately. Get light switches that glow.  Arrange furniture to create clear pathways. Keep furniture in the same place.  Firmly attach carpet with non-skid or double-sided tape.  Eliminate uneven floor surfaces.  Select a carpet pattern that does not visually hide the edge of steps.  Be aware of all pets. OTHER HOME SAFETY TIPS  Set the water temperature for 120 F (48.8 C).  Keep  emergency numbers on or near the telephone.  Keep smoke detectors on every level of the home and near sleeping areas. Document Released: 11/02/2002 Document Revised: 05/13/2012 Document Reviewed: 02/01/2012 [facility] Patient Information 2015 [place], [place]. This information is not intended to replace advice given to you by your health care provider. Make sure you discuss any questions you have with your health care provider.   Preventive Care for Adults Ages 40 and over  Blood pressure check.** / Every 1 to 2 years.  Lipid and cholesterol check.**/ Every 5 years beginning at age 91.  Lung cancer screening. / Every year if you are aged 47-80 years and have a 30-pack-year history of smoking and currently smoke or have quit within the past 15 years. Yearly screening is stopped once you have quit smoking for at least 15 years or develop a health problem that would prevent you from having lung cancer treatment.  Fecal occult blood test (FOBT) of stool. / Every year beginning at age 29 and continuing until age 23. You may not have to do this test if you get a colonoscopy every 10 years.  Flexible sigmoidoscopy** or colonoscopy.** / Every 5 years for a flexible sigmoidoscopy or every 10 years for a colonoscopy beginning at age 33 and continuing until age 70.  Hepatitis C blood test.** / For all people born from [birth year] through 1965 and any individual with known risks for hepatitis C.  Abdominal aortic aneurysm (AAA) screening.** / A one-time screening for ages 2 to 46 years who are current or former smokers.  Skin self-exam. / Monthly.  Influenza vaccine. / Every year.  Tetanus, diphtheria, and acellular pertussis (Tdap/Td) vaccine.** / 1 dose of Td every 10 years.  Varicella vaccine.** / Consult your health care provider.  Zoster vaccine.** / 1 dose for adults aged 98 years or older.  Pneumococcal 13-valent conjugate (PCV13) vaccine.** / Consult your health care provider.  Pneumococcal  polysaccharide (PPSV23) vaccine.** / 1 dose for all adults aged 2 years and older.  Meningococcal vaccine.** / Consult your health care provider.  Hepatitis A vaccine.** / Consult your health care provider.  Hepatitis B vaccine.** / Consult your health care provider.  Haemophilus influenzae type b (Hib) vaccine.** / Consult your health care provider. **Family history and personal history of risk and conditions may change your health care provider's recommendations. Document Released: 01/08/2002 Document Revised: 11/17/2013 Document Reviewed: 04/09/2011 [facility] Patient Information 2015 [place], [place]. This information is not intended to replace advice given to you by your health care provider. Make sure you discuss any questions you have with your health care provider.

## 2015-08-24 NOTE — Progress Notes (Signed)
Subjective:    Patient ID: Lori Joseph, female    DOB: 1941/08/27, 74 y.o.   MRN: [id number]  DOS:  08/24/2015 Type of visit - description :   Here for Medicare AWV:   1. Risk factors based on Past M, S, F history: reviewed   2. Physical Activities: sedentary, desk job    3. Depression/mood: neg screening 4. Hearing: no problems noted or reported   5. ADL's: independent   6. Fall Risk: no recent falls, prevention discussed   7. Home Safety: does feel safe at home   8. Height, weight, &visual acuity: see VS, sees ophtalmology q 4 months   9. Counseling: yes   10. Labs ordered based on risk factors: yes   11. Referral Coordination, if needed   12. Care Plan, see assessment and plan   13. Cognitive Assessment: motor skills, cognition and memory seem normal  14. Providers list updated  15. End of life discussed, see AVS  in addition, we discussed the following Was recently seen with cellulitis of the breast, symptoms resolved, area back to normal Hypertension: Good compliance with medication. High cholesterol, on medications, no apparent side effects. Hypothyroidism, good compliance with medicines    Review of Systems Constitutional: No fever. No chills. No unexplained wt changes. No unusual sweats  HEENT: No dental problems, no ear discharge, no facial swelling, no voice changes. No eye discharge, no eye  redness , no  intolerance to light   Respiratory: No wheezing , no  difficulty breathing. No cough , no mucus production  Cardiovascular: No CP, no leg swelling , no  Palpitations  GI: no nausea, no vomiting, no diarrhea , no  abdominal pain.  No blood in the stools. No dysphagia, no odynophagia    Endocrine: No polyphagia, no polyuria , no polydipsia  GU: No dysuria, gross hematuria, difficulty urinating. No urinary urgency, no frequency.  Musculoskeletal: No joint swellings or unusual aches or pains  Skin: No change in the color of the skin, palor , no   Rash  Allergic, immunologic: No environmental allergies , no  food allergies  Neurological: No dizziness no  syncope. No headaches. No diplopia, no slurred, no slurred speech, no motor deficits, no facial  Numbness  Hematological: No enlarged lymph nodes, no easy bruising , no unusual bleedings  Psychiatry: No suicidal ideas, no hallucinations, no beavior problems, no confusion.  No unusual/severe anxiety, no depression   Past Medical History  Diagnosis Date  . HYPERTENSION   . OSTEOPENIA   . HYPERLIPIDEMIA   . Allergic rhinitis   . H/O retinal detachment   . H/O acute pancreatitis   . Family history of anesthesia complication     daughter hard to wake up  . Cancer     breast -left  . Hurthle cell neoplasm of thyroid     Right   . Hypothyroidism 05/31/2015  . Hurthle cell neoplasm of thyroid   . Primary cancer of upper outer quadrant of left female breast     Dr. Lindi Adie  . Radiation 11/11/15-12/30/14    left breast 50.4 gray, lumpectomy cavity boosted to 62.4 gray    Past Surgical History  Procedure Laterality Date  . Tubal ligation    . Laser eye surgery, detached retina Left   . Breast biopsy Left 08/27/14  . Cholecystectomy  2000  . Tonsillectomy    . Dilation and curettage of uterus    . Colonoscopy    . Radioactive seed guided mastectomy  with axillary sentinel lymph node biopsy Left 09/16/2014    Procedure: RADIOACTIVE SEED GUIDED PARTIAL MASTECTOMY WITH AXILLARY SENTINEL LYMPH NODE BIOPSY;  Surgeon: [name], MD;  Location: [facility];  Service: General;  Laterality: Left;  . Thyroid lobectomy N/A 02/10/2015    Procedure: RIGHT THYROID LOBECTOMY;  Surgeon: [name], MD;  Location: [facility];  Service: General;  Laterality: N/A;    Social History   Social History  . Marital Status: Married    Spouse Name: Ruthann Cancer  . Number of Children: 2  . 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 Smoking status: Never Smoker   . Smokeless tobacco: Not on file  . Alcohol Use: No     Comment: rarely wine  . Drug Use: No  . Sexual Activity: Not on file   Other Topics Concern  . Not on file   Social History Narrative   Lives w/ husband     Family History  Problem Relation Age of Onset  . Colon cancer Neg Hx   . CAD Neg Hx   . Diabetes Neg Hx   . Breast cancer Sister 41  . Cancer Father     lung cancer ; smoker  . Cancer Brother     3 brothers with lung cancer, all smokers  . Cancer Paternal Uncle     2 pat uncles with lung cancer, smokers  . Kidney cancer Sister 74  . Melanoma Sister 72  . Ovarian cancer Other 31    niece with ovarian cancer (related through sister with breast cancer)       Medication List       This list is accurate as of: 08/24/15 11:59 PM.  Always use your most recent med list.               anastrozole 1 MG tablet  Commonly known as:  ARIMIDEX  Take 1 tablet (1 mg total) by mouth daily.     aspirin 81 MG EC tablet  Take 81 mg by mouth every morning.     calcium carbonate 1250 MG capsule  Take 2,500 mg by mouth every morning.     cholecalciferol 1000 UNITS tablet  Commonly known as:  VITAMIN D  Take 2,500 Units by mouth every morning.     dorzolamide-timolol 22.3-6.8 MG/ML ophthalmic solution  Commonly known as:  COSOPT  Place 1 drop into both eyes every morning.     fluticasone 0.005 % ointment  Commonly known as:  CUTIVATE  Apply 1 application topically 3 (three) times daily as needed.     latanoprost 0.005 % ophthalmic solution  Commonly known as:  XALATAN  Place 1 drop into both eyes at bedtime.     levothyroxine 25 MCG tablet  Commonly known as:  SYNTHROID, LEVOTHROID  Take 1 tablet (25 mcg total) by mouth daily before breakfast.     lidocaine 5 %  Commonly known as:  LIDODERM  Place 1-2 patches onto the skin daily. Remove & Discard patch within 12 hours or as directed by MD     multivitamin tablet   Take 1 tablet by mouth every morning.     olmesartan 40 MG tablet  Commonly known as:  BENICAR  Take 1 tablet (40 mg total) by mouth daily.     pravastatin 40 MG tablet  Commonly known as:  PRAVACHOL  Take 1 tablet (40 mg  total) by mouth daily.           Objective:   Physical Exam BP 126/70 mmHg  Pulse 60  Temp(Src) 97.6 F (36.4 C) (Oral)  Ht 5\' 3"  (1.6 m)  Wt 221 lb 2 oz (100.302 kg)  BMI 39.18 kg/m2  SpO2 98% General:   Well developed, well nourished . NAD.  HEENT:  Normocephalic . Face symmetric, atraumatic Neck: No thyromegaly, no masses Lungs:  CTA B Normal respiratory effort, no intercostal retractions, no accessory muscle use. Heart: RRR,  no murmur.  no pretibial edema bilaterally  Abdomen:  Not distended, soft, non-tender. No rebound or rigidity. No mass,organomegaly Skin: Not pale. Not jaundice Neurologic:  alert & oriented X3.  Speech normal, gait appropriate for age and unassisted Psych--  Cognition and judgment appear intact.  Cooperative with normal attention span and concentration.  Behavior appropriate. No anxious or depressed appearing.    Assessment & Plan:   Assessment > HTN Hyperlipidemia Hypothyroidism Osteopenia: T score 2010 normal, T score 2016 (1.2), T score 05-2012 (-1.2), on calcium and vitamin D Oncology: --Breast cancer, left, dx  2015, XRT 12/2-16 to 12/2014, on Arimidex --Hurthle Cell neoplasm of the thyroid, thyroidectomy 01-2015 Chronic right-sided chest pain:  x-rays CT abdomen and pelvis 2016 negative, saw pain mngmt, ortho ---> better with Lidoderm patch  Glaucoma suspect  H/o acute pancreatitis H/o vertebral detachment ++ FH Lung Ca (pt is not a smoker)  Plan HTN: Well-controlled, continue Benicar, recent BMP satisfactory High cholesterol: Continue statins, check FLP, recent LFTs normal Hypothyroidism: Due for a TSH Osteopenia: Reassess next year Hyperglycemia: Check a A1c

## 2015-08-24 NOTE — Assessment & Plan Note (Addendum)
Td 2008; pneumonia shot 2008;prevnar 2015; zostavax 2012 Flu shot today Female care:  Dr Sonny Dandy next app 11-2015 H/o breast ca, to see oncology 11-2014  Cscope @ [facility] 01-2008 : 2 polyps ----> tubular adenomas w/  low grade dysplasia Cscope 08/2010, Dr Henrene Pastor, very small polyps,due for a cscope, she is aware Diet and exercise discussed

## 2015-08-25 DIAGNOSIS — Z09 Encounter for follow-up examination after completed treatment for conditions other than malignant neoplasm: Secondary | ICD-10-CM | POA: Insufficient documentation

## 2015-08-25 NOTE — Assessment & Plan Note (Signed)
HTN: Well-controlled, continue Benicar, recent BMP satisfactory High cholesterol: Continue statins, check FLP, recent LFTs normal Hypothyroidism: Due for a TSH Osteopenia: Reassess next year Hyperglycemia: Check a A1c

## 2015-09-07 NOTE — Assessment & Plan Note (Signed)
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 N0 M0 Oncotype DX recurrence score 15, 9% ROR Currently on anastrozole 1 mg daily started 01/25/2015  Anastrozole toxicities: Patient denies any problems or concerns taking an acid. She denies any hot flashes or myalgias. Breast cancer surveillance: 1. Breast exam 03/01/2015 is normal 2. Mammograms to be done in October of every year.  Right flank pain: Differential diagnosis between a kidney stone versus intestinal diverticulitis the CT scan done did not show any abnormalities. She is undergoing an MRI evaluation. I discussed with her the importance of drinking lots of water and eating foods that contain high fiber as well as lactobacillus probiotics.  Return to clinic in 6 months for follow up

## 2015-09-08 ENCOUNTER — Telehealth: Payer: Self-pay | Admitting: Hematology and Oncology

## 2015-09-08 ENCOUNTER — Ambulatory Visit (HOSPITAL_BASED_OUTPATIENT_CLINIC_OR_DEPARTMENT_OTHER): Payer: Medicare Other | Admitting: Hematology and Oncology

## 2015-09-08 ENCOUNTER — Encounter: Payer: Self-pay | Admitting: Hematology and Oncology

## 2015-09-08 VITALS — BP 147/53 | HR 61 | Temp 98.8°F | Resp 18 | Ht 63.0 in | Wt 219.0 lb

## 2015-09-08 DIAGNOSIS — C50412 Malignant neoplasm of upper-outer quadrant of left female breast: Secondary | ICD-10-CM

## 2015-09-08 NOTE — Telephone Encounter (Signed)
Patient will call for her mammo

## 2015-09-08 NOTE — Telephone Encounter (Signed)
Appointments made and avs pritned for pateint °

## 2015-09-08 NOTE — Progress Notes (Signed)
Patient Care Team: Colon Branch, MD as PCP - General [name], MD as Consulting Physician (Hematology and Oncology) [name], MD as Consulting Physician (General Surgery) Marylynn Pearson, MD as Consulting Physician (Obstetrics and Gynecology)  DIAGNOSIS: No matching staging information was found for the patient.  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 N0 M0 Oncotype DX recurrence score 15, 9% ROR   01/25/2015 -  Anti-estrogen oral therapy Anastrozole 1 mg daily 5 years   02/10/2015 Surgery Right thyroid lobectomy: Hurthle cell neoplasm 2.3 cm, adenomatous nodules    CHIEF COMPLIANT: follow-up of breast cancer  INTERVAL HISTORY: Lori Joseph is a 74 year old with above-mentioned history of left breast cancer treated with lumpectomy and is now on anastrozole. She is tolerating it fairly well. Does not have any hot flashes. She had a back pain which was resolved after getting the injection to the back. She was also apparently diagnosed with a skin infection over the right breast which was treated with sulfa antibiotic resolved. She was in the hospital prior to that for the same problem.  REVIEW OF SYSTEMS:   Constitutional: Denies fevers, chills or abnormal weight loss Eyes: Denies blurriness of vision Ears, nose, mouth, throat, and face: Denies mucositis or sore throat Respiratory: Denies cough, dyspnea or wheezes Cardiovascular: Denies palpitation, chest discomfort or lower extremity swelling Gastrointestinal:  Denies nausea, heartburn or change in bowel habits Skin: Denies abnormal skin rashes Lymphatics: Denies new lymphadenopathy or easy bruising Neurological:Denies numbness, tingling or new weaknesses Behavioral/Psych: Mood is stable, no new changes   Breast:  denies any pain or lumps or nodules in either breasts All other systems were reviewed with the patient and are negative.  I have reviewed the past medical history, past surgical history, social history and family history with the patient and they are unchanged from previous note.  ALLERGIES:  is allergic to codeine; carvedilol; felodipine; lisinopril; losartan potassium; and radiaplexrx.  MEDICATIONS:  Current Outpatient Prescriptions  Medication Sig Dispense Refill  . anastrozole (ARIMIDEX) 1 MG tablet Take 1 tablet (1 mg total) by mouth daily. (Patient taking differently: Take 1 mg by mouth every morning. ) 90 tablet 3  . aspirin 81 MG EC tablet Take 81 mg by mouth every morning.     . calcium carbonate 1250 MG capsule Take 2,500 mg by mouth every morning.     . cholecalciferol (VITAMIN D) 1000 UNITS tablet Take 2,500 Units by mouth every morning.     . dorzolamide-timolol (COSOPT) 22.3-6.8 MG/ML ophthalmic solution Place 1 drop into both eyes every morning.     . fluticasone (CUTIVATE) 0.005 % ointment Apply 1 application topically 3 (three) times daily as needed. 30 g 3  . latanoprost (XALATAN) 0.005 % ophthalmic solution Place 1 drop into both eyes at bedtime.    Marland Kitchen levothyroxine (SYNTHROID, LEVOTHROID) 25 MCG tablet Take 1 tablet (25 mcg total) by mouth daily before breakfast. 30 tablet 3  . lidocaine (LIDODERM) 5 % Place 1-2 patches onto the skin daily. Remove & Discard patch within 12 hours or as directed by MD (Patient not taking: Reported on 05/29/2015) 30 patch 0  . Multiple Vitamin (MULTIVITAMIN) tablet Take 1 tablet by mouth every morning.     . olmesartan (BENICAR) 40 MG tablet Take 1  tablet (40 mg total) by mouth daily. 90 tablet 1  . pravastatin (PRAVACHOL) 40 MG tablet Take 1 tablet (40 mg total) by mouth daily. 30 tablet 6   No current facility-administered medications for this visit.    PHYSICAL EXAMINATION: ECOG PERFORMANCE STATUS: 0 - Asymptomatic  Filed  Vitals:   09/08/15 1147  BP: 147/53  Pulse: 61  Temp: 98.8 F (37.1 C)  Resp: 18   Filed Weights   09/08/15 1147  Weight: 219 lb (99.338 kg)    GENERAL:alert, no distress and comfortable SKIN: skin color, texture, turgor are normal, no rashes or significant lesions EYES: normal, Conjunctiva are pink and non-injected, sclera clear OROPHARYNX:no exudate, no erythema and lips, buccal mucosa, and tongue normal  NECK: supple, thyroid normal size, non-tender, without nodularity LYMPH:  no palpable lymphadenopathy in the cervical, axillary or inguinal LUNGS: clear to auscultation and percussion with normal breathing effort HEART: regular rate & rhythm and no murmurs and no lower extremity edema ABDOMEN:abdomen soft, non-tender and normal bowel sounds Musculoskeletal:no cyanosis of digits and no clubbing  NEURO: alert & oriented x 3 with fluent speech, no focal motor/sensory deficits BREAST: No palpable masses or nodules in either right or left breasts. No palpable axillary supraclavicular or infraclavicular adenopathy no breast tenderness or nipple discharge. (exam performed in the presence of a chaperone)  LABORATORY DATA:  I have reviewed the data as listed   Chemistry      Component Value Date/Time   NA 133* 05/28/2015 1904   NA 139 12/24/2014 1515   K 3.8 05/28/2015 1904   K 4.5 12/24/2014 1515   CL 101 05/28/2015 1904   CO2 24 05/28/2015 1904   CO2 24 12/24/2014 1515   BUN 20 05/28/2015 1904   BUN 14.7 12/24/2014 1515   CREATININE 1.04* 05/28/2015 1904   CREATININE 0.8 12/24/2014 1515      Component Value Date/Time   CALCIUM 8.8* 05/28/2015 1904   CALCIUM 9.1 12/24/2014 1515   ALKPHOS 85 05/28/2015 1904   ALKPHOS 107 12/24/2014 1515   AST 21 05/28/2015 1904   AST 19 12/24/2014 1515   ALT 17 05/28/2015 1904   ALT 18 12/24/2014 1515   BILITOT 0.7 05/28/2015 1904   BILITOT 0.62 12/24/2014 1515       Lab Results  Component Value Date   WBC 8.9 05/28/2015   HGB  12.0 05/28/2015   HCT 36.6 05/28/2015   MCV 94.1 05/28/2015   PLT 155 05/28/2015   NEUTROABS 7.9* 05/28/2015   ASSESSMENT & PLAN:  Breast cancer of upper-outer quadrant of left female breast Left breast lumpectomy 09/14/2014: 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 N0 M0 Oncotype DX recurrence score 15, 9% ROR Currently on anastrozole 1 mg daily started 01/25/2015  Anastrozole toxicities: Patient denies any problems or concerns taking an acid. She denies any hot flashes or myalgias. Breast cancer surveillance: 1. Breast exam 09/08/2015 is normal 2. Mammograms to be done in October of every year.we will order her mammograms for the next week  Right flank pain: resolved with a spine injection.  Return to clinic in 1 year for follow up   No orders of the defined types were placed in this encounter.   The patient has a good understanding of the overall plan. she agrees with it. she will call with any problems that may develop before the next visit here.   [name]  K, MD 09/08/2015

## 2015-10-11 ENCOUNTER — Other Ambulatory Visit: Payer: Self-pay | Admitting: Internal Medicine

## 2015-10-14 ENCOUNTER — Ambulatory Visit (HOSPITAL_BASED_OUTPATIENT_CLINIC_OR_DEPARTMENT_OTHER)
Admission: RE | Admit: 2015-10-14 | Discharge: 2015-10-14 | Disposition: A | Discharge status: Home / Self Care | Payer: Medicare Other | Source: Ambulatory Visit | Attending: Internal Medicine | Admitting: Internal Medicine

## 2015-10-14 ENCOUNTER — Ambulatory Visit
Admission: RE | Admit: 2015-10-14 | Discharge: 2015-10-14 | Disposition: A | Discharge status: Home / Self Care | Payer: Medicare Other | Source: Ambulatory Visit | Attending: Hematology and Oncology | Admitting: Hematology and Oncology

## 2015-10-14 ENCOUNTER — Encounter: Payer: Self-pay | Admitting: Internal Medicine

## 2015-10-14 ENCOUNTER — Ambulatory Visit (INDEPENDENT_AMBULATORY_CARE_PROVIDER_SITE_OTHER): Payer: Medicare Other | Admitting: Internal Medicine

## 2015-10-14 ENCOUNTER — Other Ambulatory Visit: Payer: Self-pay | Admitting: Hematology and Oncology

## 2015-10-14 ENCOUNTER — Other Ambulatory Visit: Payer: Self-pay | Admitting: Internal Medicine

## 2015-10-14 VITALS — BP 108/62 | HR 71 | Temp 98.1°F | Ht 63.0 in | Wt 216.4 lb

## 2015-10-14 DIAGNOSIS — R059 Cough, unspecified: Secondary | ICD-10-CM

## 2015-10-14 DIAGNOSIS — C50412 Malignant neoplasm of upper-outer quadrant of left female breast: Secondary | ICD-10-CM

## 2015-10-14 DIAGNOSIS — Z09 Encounter for follow-up examination after completed treatment for conditions other than malignant neoplasm: Secondary | ICD-10-CM | POA: Diagnosis not present

## 2015-10-14 DIAGNOSIS — J189 Pneumonia, unspecified organism: Secondary | ICD-10-CM | POA: Diagnosis not present

## 2015-10-14 DIAGNOSIS — R5383 Other fatigue: Secondary | ICD-10-CM | POA: Diagnosis not present

## 2015-10-14 DIAGNOSIS — R928 Other abnormal and inconclusive findings on diagnostic imaging of breast: Secondary | ICD-10-CM | POA: Diagnosis not present

## 2015-10-14 DIAGNOSIS — D649 Anemia, unspecified: Secondary | ICD-10-CM | POA: Diagnosis not present

## 2015-10-14 DIAGNOSIS — R05 Cough: Secondary | ICD-10-CM | POA: Diagnosis not present

## 2015-10-14 LAB — CBC WITH DIFFERENTIAL/PLATELET
BASOS ABS: 0.1 10*3/uL (ref 0.0–0.1)
Basophils Relative: 1 % (ref 0–1)
EOS ABS: 0.2 10*3/uL (ref 0.0–0.7)
EOS PCT: 3 % (ref 0–5)
HCT: 30.4 % — ABNORMAL LOW (ref 36.0–46.0)
Hemoglobin: 10.1 g/dL — ABNORMAL LOW (ref 12.0–15.0)
LYMPHS ABS: 1.1 10*3/uL (ref 0.7–4.0)
Lymphocytes Relative: 20 % (ref 12–46)
MCH: 29.3 pg (ref 26.0–34.0)
MCHC: 33.2 g/dL (ref 30.0–36.0)
MCV: 88.1 fL (ref 78.0–100.0)
MPV: 10.1 fL (ref 8.6–12.4)
Monocytes Absolute: 0.4 10*3/uL (ref 0.1–1.0)
Monocytes Relative: 7 % (ref 3–12)
Neutro Abs: 3.9 10*3/uL (ref 1.7–7.7)
Neutrophils Relative %: 69 % (ref 43–77)
PLATELETS: 269 10*3/uL (ref 150–400)
RBC: 3.45 MIL/uL — ABNORMAL LOW (ref 3.87–5.11)
RDW: 13.4 % (ref 11.5–15.5)
WBC: 5.6 10*3/uL (ref 4.0–10.5)

## 2015-10-14 MED ORDER — MOXIFLOXACIN HCL 400 MG PO TABS: 400.0000 mg | ORAL_TABLET | Freq: Every day | ORAL | Status: DC

## 2015-10-14 NOTE — Progress Notes (Signed)
Pre visit review using our clinic review tool, if applicable. No additional management support is needed unless otherwise documented below in the visit note. 

## 2015-10-14 NOTE — Patient Instructions (Signed)
Get your blood work before you leave   Stop by the first floor and get the XR   Flonase daily  Mucinex DM as needed   Call anytime if you feel worse.   Next visit  for a  checkup in 2 or 3 weeks  (30 minutes) Please schedule an appointment at the front desk

## 2015-10-14 NOTE — Progress Notes (Signed)
Subjective:    Patient ID: Lori Joseph, female    DOB: 1941-10-03, 74 y.o.   MRN: [id number]  DOS:  10/14/2015 Type of visit - description : Acute visit Interval history: Cdial of ahief complaint today is fatigue, described as simply lack of energy, symptoms started 2 weeks ago. In the midle of a normal day she simply needs to sit down and rest. She was so tired one day that she couldn't get up from bed to go to church. Also sometimes she "feel so tired I could faint" but no actual syncope or LOC.  Also, for a couple weeks having postnasal dripping, cough, chest congestion, "like something is sitting in my chest" she points to the upper mid chest, tracheal area.    Review of Systems Denies any fever chills, appetite has been decreased. No actual substernal chest pain, no SOB or DOE, no edema or palpitations. No Vomiting, diarrhea or blood in the stools. No actual dysphagia or odynophagia Not taking any new medications. No anxiety or depression No headache, dizziness, double vision, slurred speech or motor deficits. + Snoring but denies feeling sleepy "just tired"  Past Medical History  Diagnosis Date  . HYPERTENSION   . OSTEOPENIA   . HYPERLIPIDEMIA   . Allergic rhinitis   . H/O retinal detachment   . H/O acute pancreatitis   . Family history of anesthesia complication     daughter hard to wake up  . Cancer [facility])     breast -left  . Hurthle cell neoplasm of thyroid     Right   . Hypothyroidism 05/31/2015  . Hurthle cell neoplasm of thyroid   . Primary cancer of upper outer quadrant of left female breast ([place])     Dr. Lindi Adie  . Radiation 11/11/15-12/30/14    left breast 50.4 gray, lumpectomy cavity boosted to 62.4 gray    Past Surgical History  Procedure Laterality Date  . Tubal ligation    . Laser eye surgery, detached retina Left   . Breast biopsy Left 08/27/14  . Cholecystectomy  2000  . Tonsillectomy    . Dilation and curettage of uterus    . Colonoscopy    .  Radioactive seed guided mastectomy with axillary sentinel lymph node biopsy Left 09/16/2014    Procedure: RADIOACTIVE SEED GUIDED PARTIAL MASTECTOMY WITH AXILLARY SENTINEL LYMPH NODE BIOPSY;  Surgeon: [name], MD;  Location: [facility];  Service: General;  Laterality: Left;  . Thyroid lobectomy N/A 02/10/2015    Procedure: RIGHT THYROID LOBECTOMY;  Surgeon: [name], MD;  Location: [facility];  Service: General;  Laterality: N/A;    Social History   Social History  . Marital Status: Married    Spouse Name: Ruthann Cancer  . Number of Children: 2  . 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 Smoking status: Never Smoker   . Smokeless tobacco: Not on file  . Alcohol Use: No     Comment: rarely wine  . Drug Use: No  . Sexual Activity: Not on file   Other Topics Concern  . Not on file   Social History Narrative   Lives w/ husband        Medication List       This list is accurate as of: 10/14/15 11:59 PM.  Always use your most recent med list.  anastrozole 1 MG tablet  Commonly known as:  ARIMIDEX  Take 1 tablet (1 mg total) by mouth daily.     aspirin 81 MG EC tablet  Take 81 mg by mouth every morning.     calcium carbonate 1250 MG capsule  Take 2,500 mg by mouth every morning.     cholecalciferol 1000 UNITS tablet  Commonly known as:  VITAMIN D  Take 2,500 Units by mouth every morning.     dorzolamide-timolol 22.3-6.8 MG/ML ophthalmic solution  Commonly known as:  COSOPT  Place 1 drop into both eyes every morning.     fluticasone 0.005 % ointment  Commonly known as:  CUTIVATE  Apply 1 application topically 3 (three) times daily as needed.     latanoprost 0.005 % ophthalmic solution  Commonly known as:  XALATAN  Place 1 drop into both eyes at bedtime.     levothyroxine 25 MCG tablet  Commonly known as:  SYNTHROID, LEVOTHROID  Take 1 tablet (25 mcg total)  by mouth daily before breakfast.     lidocaine 5 %  Commonly known as:  LIDODERM  Place 1-2 patches onto the skin daily. Remove & Discard patch within 12 hours or as directed by MD     moxifloxacin 400 MG tablet  Commonly known as:  AVELOX  Take 1 tablet (400 mg total) by mouth daily.     multivitamin tablet  Take 1 tablet by mouth every morning.     olmesartan 40 MG tablet  Commonly known as:  BENICAR  Take 1 tablet (40 mg total) by mouth daily.     pravastatin 40 MG tablet  Commonly known as:  PRAVACHOL  Take 1 tablet (40 mg total) by mouth daily.           Objective:   Physical Exam BP 108/62 mmHg  Pulse 71  Temp(Src) 98.1 F (36.7 C) (Oral)  Ht 5\' 3"  (1.6 m)  Wt 216 lb 6 oz (98.147 kg)  BMI 38.34 kg/m2  SpO2 97% General:   Well developed, well nourished . NAD.  Neck: No JVD at 45 HEENT:  Normocephalic . Face symmetric, atraumatic Lungs:  CTA B Normal respiratory effort, no intercostal retractions, no accessory muscle use. Heart: RRR,  no murmur.  no pretibial edema bilaterally  Abdomen:  Not distended, soft, non-tender. No rebound or rigidity.   Skin: Not pale. Not jaundice Neurologic:  alert & oriented X3.  Speech normal, gait appropriate for age and unassisted Psych--  Cognition and judgment appear intact.  Cooperative with normal attention span and concentration.  Behavior appropriate. No anxious or depressed appearing.    Assessment & Plan:    Assessment > HTN Hyperlipidemia Hypothyroidism Osteopenia: T score 2010 normal, T score 2016 (1.2), T score 05-2012 (-1.2), on calcium and vitamin D Oncology: --Breast cancer, left, dx  2015, XRT 12/2-16 to 12/2014, on Arimidex --Hurthle Cell neoplasm of the thyroid, thyroidectomy 01-2015 Chronic right-sided chest pain:  x-rays CT abdomen and pelvis 2016 negative, saw pain mngmt, ortho ---> better with Lidoderm patch and a local injection in the back Glaucoma suspect  H/o acute pancreatitis H/o  vertebral detachment ++ FH Lung Ca (pt is not a smoker)  Plan Profound fatigue: x last 2 weeks, cardiorespiratory ROS (-) (except for cough, see below), no evidence of volume overload, no depression, no new medications. She does snore, does not feel sleepy, epworth screen negative (score 2) We'll get a BMP, CBC, TSH, vitamin D, 123456, folic acid. If labs are  negative, will recommend observation for 2 or 3 weeks. Cough: Reports cough, postnasal dripping and "something in the chest". We will get a chest xr, consistent use of Flonase. If x-ray neg will recommend observation for 2 weeks. Addendum: Chest x-ray showed pneumonia, recommend Avelox 400 mg daily for a week. Will send a rx and let the patient know. Call if not improving. I spoke with the patient today, DX and need to take antibiotics discussed. JP 10-15-2015 RTC 3 weeks

## 2015-10-15 LAB — BASIC METABOLIC PANEL
BUN: 19 mg/dL (ref 7–25)
CHLORIDE: 103 mmol/L (ref 98–110)
CO2: 26 mmol/L (ref 20–31)
Calcium: 9 mg/dL (ref 8.6–10.4)
Creat: 0.78 mg/dL (ref 0.60–0.93)
Glucose, Bld: 93 mg/dL (ref 65–99)
POTASSIUM: 4.1 mmol/L (ref 3.5–5.3)
Sodium: 140 mmol/L (ref 135–146)

## 2015-10-15 LAB — TSH: TSH: 2.004 u[IU]/mL (ref 0.350–4.500)

## 2015-10-15 LAB — VITAMIN B12: Vitamin B-12: >2000 pg/mL — ABNORMAL HIGH (ref 211–911)

## 2015-10-15 LAB — FOLATE: Folate: >20 ng/mL

## 2015-10-15 NOTE — Assessment & Plan Note (Addendum)
  Profound fatigue: x last 2 weeks, cardiorespiratory ROS (-) (except for cough, see below), no evidence of volume overload, no depression, no new medications. She does snore, does not feel sleepy, epworth screen negative (score 2) We'll get a BMP, CBC, TSH, vitamin D, 123456, folic acid. If labs are negative, will recommend observation for 2 or 3 weeks. Cough: Reports cough, postnasal dripping and "something in the chest". We will get a chest xr, consistent use of Flonase. If x-ray neg will recommend observation for 2 weeks. Addendum: Chest x-ray showed pneumonia, recommend Avelox 400 mg daily for a week. Will send a rx and let the patient know. Call if not improving. I spoke with the patient today, DX and need to take antibiotics discussed. JP 10-15-2015 RTC 3 weeks

## 2015-10-16 LAB — VITAMIN D 1,25 DIHYDROXY
VITAMIN D3 1, 25 (OH): 31 pg/mL
Vitamin D 1, 25 (OH)2 Total: 31 pg/mL (ref 18–72)
Vitamin D2 1, 25 (OH)2: <8 pg/mL

## 2015-10-17 ENCOUNTER — Other Ambulatory Visit: Payer: Medicare Other

## 2015-10-17 DIAGNOSIS — D649 Anemia, unspecified: Secondary | ICD-10-CM

## 2015-10-17 LAB — FERRITIN: Ferritin: 305 ng/mL — ABNORMAL HIGH (ref 10–291)

## 2015-10-17 LAB — IRON: Iron: 27 ug/dL — ABNORMAL LOW (ref 45–160)

## 2015-10-18 ENCOUNTER — Telehealth: Payer: Self-pay | Admitting: Internal Medicine

## 2015-10-18 NOTE — Telephone Encounter (Signed)
Spoke with Pt, she is not having fever, chills, or really any cough or congestion, she is still feeling very fatigued. Informed her to rest, complete antibiotics as prescribed. Informed her it can take several days to weeks to show improvement from pneumonia. Pt verbalized understanding and has F/U appt scheduled for 12/52016 at 2 PM. Informed her to go to ED or UC over weekend if worse.

## 2015-10-18 NOTE — Telephone Encounter (Signed)
If she is feeling much worse, having high fever, chest pain, difficulty breathing I would do something different. If she is feeling about the same I recommend to continue antibiotics and reassess next week. Please set up an appointment for next week. ER during the weekend if symptoms severe

## 2015-10-18 NOTE — Telephone Encounter (Signed)
Caller name: Maybeline  Relationship to patient: Self   Can be reached: [phone]   Reason for call: pt says that she was seen on 11/18. She says that she was prescribe an antibiotic and has been taking mucinex. Pt says that she isn't feeling better. She says that she is still having the same symptoms. Pt would like to know if she need to be seen again or could she be advised further.

## 2015-10-18 NOTE — Telephone Encounter (Signed)
Please advise 

## 2015-10-19 NOTE — Addendum Note (Signed)
Addended by: Wilfrid Lund on: 10/19/2015 09:19 AM   Modules accepted: Orders

## 2015-10-24 ENCOUNTER — Telehealth: Payer: Self-pay | Admitting: Internal Medicine

## 2015-10-24 NOTE — Telephone Encounter (Signed)
Tried calling Pt, no answer, voicemail did not pick up. Will try contacting again later.

## 2015-10-24 NOTE — Telephone Encounter (Signed)
Her hemoglobin is ~ 10.0, I don't think she needs urgent hematology referral. If she feels that poorly , please get an appointment to see me this week

## 2015-10-24 NOTE — Telephone Encounter (Signed)
Patient is calling about referral to hematology. She has a appointment with them on 11/10/15. She says she is very weak and wants to speak to someone about possibly taking something for Iron. Does not think she can wait till 11/10/15. Please advise.

## 2015-10-24 NOTE — Telephone Encounter (Signed)
Please advise 

## 2015-10-25 NOTE — Telephone Encounter (Signed)
Left message for pt to return my call.

## 2015-10-26 NOTE — Telephone Encounter (Signed)
Pt has appt scheduled 10/31/2015.

## 2015-10-31 ENCOUNTER — Encounter: Payer: Self-pay | Admitting: Internal Medicine

## 2015-10-31 ENCOUNTER — Ambulatory Visit (INDEPENDENT_AMBULATORY_CARE_PROVIDER_SITE_OTHER): Payer: Medicare Other | Admitting: Internal Medicine

## 2015-10-31 VITALS — BP 121/79 | HR 72 | Temp 98.6°F | Ht 63.0 in | Wt 214.8 lb

## 2015-10-31 DIAGNOSIS — J189 Pneumonia, unspecified organism: Secondary | ICD-10-CM

## 2015-10-31 DIAGNOSIS — J13 Pneumonia due to Streptococcus pneumoniae: Secondary | ICD-10-CM

## 2015-10-31 DIAGNOSIS — D649 Anemia, unspecified: Secondary | ICD-10-CM | POA: Diagnosis not present

## 2015-10-31 DIAGNOSIS — R5383 Other fatigue: Secondary | ICD-10-CM | POA: Diagnosis not present

## 2015-10-31 DIAGNOSIS — Z09 Encounter for follow-up examination after completed treatment for conditions other than malignant neoplasm: Secondary | ICD-10-CM

## 2015-10-31 NOTE — Progress Notes (Signed)
Subjective:    Patient ID: Lori Joseph, female    DOB: 04-Nov-1941, 74 y.o.   MRN: [id number]  DOS:  10/31/2015 Type of visit - description : Follow-up previous visit Interval history:  Was diagnosed with pneumonia, status post Avelox, feeling better, cough has significantly decreased. She also complained of profound fatigue: Feeling better. Anemia, due for labs.   Review of Systems No fever chills No nausea, vomiting, diarrhea. No blood in the stools or stomach pain.  Past Medical History  Diagnosis Date  . HYPERTENSION   . OSTEOPENIA   . HYPERLIPIDEMIA   . Allergic rhinitis   . H/O retinal detachment   . H/O acute pancreatitis   . Family history of anesthesia complication     daughter hard to wake up  . Cancer [facility])     breast -left  . Hurthle cell neoplasm of thyroid     Right   . Hypothyroidism 05/31/2015  . Hurthle cell neoplasm of thyroid   . Primary cancer of upper outer quadrant of left female breast ([place])     Dr. Lindi Adie  . Radiation 11/11/15-12/30/14    left breast 50.4 gray, lumpectomy cavity boosted to 62.4 gray    Past Surgical History  Procedure Laterality Date  . Tubal ligation    . Laser eye surgery, detached retina Left   . Breast biopsy Left 08/27/14  . Cholecystectomy  2000  . Tonsillectomy    . Dilation and curettage of uterus    . Colonoscopy    . Radioactive seed guided mastectomy with axillary sentinel lymph node biopsy Left 09/16/2014    Procedure: RADIOACTIVE SEED GUIDED PARTIAL MASTECTOMY WITH AXILLARY SENTINEL LYMPH NODE BIOPSY;  Surgeon: [name], MD;  Location: [facility];  Service: General;  Laterality: Left;  . Thyroid lobectomy N/A 02/10/2015    Procedure: RIGHT THYROID LOBECTOMY;  Surgeon: [name], MD;  Location: [facility];  Service: General;  Laterality: N/A;    Social History   Social History  . Marital Status: Married    Spouse Name: Ruthann Cancer  . Number of Children: 2  . Years of Education: N/A    Occupational History  . 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 Smoking status: Never Smoker   . Smokeless tobacco: Not on file  . Alcohol Use: No     Comment: rarely wine  . Drug Use: No  . Sexual Activity: Not on file   Other Topics Concern  . Not on file   Social History Narrative   Lives w/ husband        Medication List       This list is accurate as of: 10/31/15  7:19 PM.  Always use your most recent med list.               anastrozole 1 MG tablet  Commonly known as:  ARIMIDEX  Take 1 tablet (1 mg total) by mouth daily.     aspirin 81 MG EC tablet  Take 81 mg by mouth every morning.     calcium carbonate 1250 MG capsule  Take 2,500 mg by mouth every morning.     cholecalciferol 1000 UNITS tablet  Commonly known as:  VITAMIN D  Take 2,500 Units by mouth every morning.     dorzolamide-timolol 22.3-6.8 MG/ML ophthalmic solution  Commonly known as:  COSOPT  Place 1 drop into both eyes every morning.     fluticasone 0.005 % ointment  Commonly known as:  CUTIVATE  Apply 1 application topically 3 (three) times daily as needed.     latanoprost 0.005 % ophthalmic solution  Commonly known as:  XALATAN  Place 1 drop into both eyes at bedtime.     levothyroxine 25 MCG tablet  Commonly known as:  SYNTHROID, LEVOTHROID  Take 1 tablet (25 mcg total) by mouth daily before breakfast.     lidocaine 5 %  Commonly known as:  LIDODERM  Place 1-2 patches onto the skin daily. Remove & Discard patch within 12 hours or as directed by MD     multivitamin tablet  Take 1 tablet by mouth every morning.     olmesartan 40 MG tablet  Commonly known as:  BENICAR  Take 1 tablet (40 mg total) by mouth daily.     pravastatin 40 MG tablet  Commonly known as:  PRAVACHOL  Take 1 tablet (40 mg total) by mouth daily.           Objective:   Physical Exam BP 121/79 mmHg  Pulse 72  Temp(Src) 98.6 F (37 C) (Oral)  Ht 5\' 3"  (1.6 m)  Wt 214 lb  12.8 oz (97.433 kg)  BMI 38.06 kg/m2  SpO2 96% General:   Well developed, well nourished . NAD.  HEENT:  Normocephalic . Face symmetric, atraumatic Lungs:  CTA B Normal respiratory effort, no intercostal retractions, no accessory muscle use. Heart: RRR,  no murmur.  No pretibial edema bilaterally  Skin: Not pale. Not jaundice Neurologic:  alert & oriented X3.  Speech normal, gait appropriate for age and unassisted Psych--  Cognition and judgment appear intact.  Cooperative with normal attention span and concentration.  Behavior appropriate. No anxious or depressed appearing.      Assessment & Plan:   Assessment > HTN Hyperlipidemia Hypothyroidism Osteopenia: T score 2010 normal, T score 2016 (1.2), T score 05-2012 (-1.2), on calcium and vitamin D Oncology: --Breast cancer, left, dx  2015, XRT 12/2-16 to 12/2014, on Arimidex --Hurthle Cell neoplasm of the thyroid, thyroidectomy 01-2015 Chronic right-sided chest pain:  x-rays CT abdomen and pelvis 2016 negative, saw pain mngmt, ortho ---> better with Lidoderm patch and a local injection in the back Glaucoma suspect  H/o acute pancreatitis H/o vertebral detachment ++ FH Lung Ca (pt is not a smoker)  Plan Community-acquired pneumonia: Status post Avelox, improving, check a chest x-ray 4 weeks Fatigue : Improving, labs were negative Anemia: Mild, hemoglobin around 10, GI ROS negative. Recheck hemoglobin, iron and ferritin. RTC by 01-2016 for a routine checkup

## 2015-10-31 NOTE — Assessment & Plan Note (Signed)
Community-acquired pneumonia: Status post Avelox, improving, check a chest x-ray 4 weeks Fatigue : Improving, labs were negative Anemia: Mild, hemoglobin around 10, GI ROS negative. Recheck hemoglobin, iron and ferritin. RTC by 01-2016 for a routine checkup

## 2015-10-31 NOTE — Progress Notes (Signed)
Pre visit review using our clinic review tool, if applicable. No additional management support is needed unless otherwise documented below in the visit note. 

## 2015-10-31 NOTE — Patient Instructions (Signed)
Get your blood work before you leave    Come back for weeks from today and get a chest x-ray to follow-up on the pneumonia.    Next visit  for a routine checkup by March 2017.  Please schedule an appointment at the front desk

## 2015-11-01 LAB — IRON: IRON: 50 ug/dL (ref 42–145)

## 2015-11-01 LAB — HEMOGLOBIN: HEMOGLOBIN: 10.6 g/dL — AB (ref 12.0–15.0)

## 2015-11-01 LAB — FERRITIN: FERRITIN: 166.4 ng/mL (ref 10.0–291.0)

## 2015-11-09 ENCOUNTER — Other Ambulatory Visit: Payer: Self-pay

## 2015-11-09 MED ORDER — OLMESARTAN MEDOXOMIL 40 MG PO TABS: 40.0000 mg | ORAL_TABLET | Freq: Every day | ORAL | Status: DC

## 2015-11-10 ENCOUNTER — Encounter: Payer: Self-pay | Admitting: Family

## 2015-11-10 ENCOUNTER — Ambulatory Visit (HOSPITAL_BASED_OUTPATIENT_CLINIC_OR_DEPARTMENT_OTHER): Payer: Medicare Other

## 2015-11-10 ENCOUNTER — Ambulatory Visit (HOSPITAL_BASED_OUTPATIENT_CLINIC_OR_DEPARTMENT_OTHER): Payer: Medicare Other | Admitting: Family

## 2015-11-10 ENCOUNTER — Ambulatory Visit: Payer: Medicare Other

## 2015-11-10 VITALS — BP 157/57 | HR 62 | Temp 98.1°F | Resp 16 | Ht 63.0 in | Wt 215.0 lb

## 2015-11-10 DIAGNOSIS — D649 Anemia, unspecified: Secondary | ICD-10-CM | POA: Diagnosis not present

## 2015-11-10 DIAGNOSIS — D61811 Other drug-induced pancytopenia: Secondary | ICD-10-CM

## 2015-11-10 DIAGNOSIS — D509 Iron deficiency anemia, unspecified: Secondary | ICD-10-CM

## 2015-11-10 LAB — COMPREHENSIVE METABOLIC PANEL
ALK PHOS: 100 U/L (ref 40–150)
ALT: 14 U/L (ref 0–55)
ANION GAP: 8 meq/L (ref 3–11)
AST: 15 U/L (ref 5–34)
Albumin: 3.1 g/dL — ABNORMAL LOW (ref 3.5–5.0)
BUN: 17 mg/dL (ref 7.0–26.0)
CO2: 23 mEq/L (ref 22–29)
CREATININE: 0.8 mg/dL (ref 0.6–1.1)
Calcium: 9.2 mg/dL (ref 8.4–10.4)
Chloride: 108 mEq/L (ref 98–109)
EGFR: 73 mL/min/{1.73_m2} — ABNORMAL LOW (ref >90)
Glucose: 98 mg/dl (ref 70–140)
POTASSIUM: 4 meq/L (ref 3.5–5.1)
Sodium: 140 mEq/L (ref 136–145)
Total Bilirubin: 0.47 mg/dL (ref 0.20–1.20)
Total Protein: 6.3 g/dL — ABNORMAL LOW (ref 6.4–8.3)

## 2015-11-10 LAB — CBC WITH DIFFERENTIAL (CANCER CENTER ONLY)
BASO#: 0 10*3/uL (ref 0.0–0.2)
BASO%: 0.4 % (ref 0.0–2.0)
EOS%: 3.3 % (ref 0.0–7.0)
Eosinophils Absolute: 0.2 10*3/uL (ref 0.0–0.5)
HCT: 31.8 % — ABNORMAL LOW (ref 34.8–46.6)
HGB: 10 g/dL — ABNORMAL LOW (ref 11.6–15.9)
LYMPH#: 0.9 10*3/uL (ref 0.9–3.3)
LYMPH%: 18.6 % (ref 14.0–48.0)
MCH: 29.2 pg (ref 26.0–34.0)
MCHC: 31.4 g/dL — ABNORMAL LOW (ref 32.0–36.0)
MCV: 93 fL (ref 81–101)
MONO#: 0.3 10*3/uL (ref 0.1–0.9)
MONO%: 6.3 % (ref 0.0–13.0)
NEUT#: 3.4 10*3/uL (ref 1.5–6.5)
NEUT%: 71.4 % (ref 39.6–80.0)
PLATELETS: 191 10*3/uL (ref 145–400)
RBC: 3.43 10*6/uL — ABNORMAL LOW (ref 3.70–5.32)
RDW: 14.5 % (ref 11.1–15.7)
WBC: 4.8 10*3/uL (ref 3.9–10.0)

## 2015-11-10 LAB — CHCC SATELLITE - SMEAR

## 2015-11-10 NOTE — Progress Notes (Signed)
Hematology/Oncology Consultation   Name: Lori Joseph      MRN: [id number]    Location: Room/bed info not found  Date: 11/10/2015 Time:11:55 AM   REFERRING PHYSICIAN: Kathlene November, MD  REASON FOR CONSULT: Anemia    DIAGNOSIS: Anemia  HISTORY OF PRESENT ILLNESS: Lori Joseph is a very pleasant 74 yo white female with a recent history of anemia. Her Hgb today is 10.0 with an MCV of 93. She is symptomatic with mild fatigue.  No family history of anemia that she is aware of. No episodes of bleeding or bruising.  She was diagnosed with pneumonia in November and has completed 7 days of Avelox. She is scheduled to have a repeat xray in January. Her SOB is improved. She still has a dry cough.  No fever, chills, n/v, rash, dizziness, chest pain, palpitations, abdominal pain or changes in bowel or bladder habits.  She was scheduled to have a colonoscopy this year but had to postpone this until she has recovered from her pneumonia. She plans to reschedule this soon. She has history of precancerous polyps.  She has history of cancer of the left breast. She had previously been on hormone replacement therapy. She states that her genetic testing was negative. 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 She had a lumpectomy in October 2015 and received radiation. She is now on Arimidex started in March 2016. She sees Dr. Lindi Adie at the [facility]. Her mammogram in November was negative. She has had no changes with her chest.  No lymphadenopathy found on exam.  Family cancer history includes: sister - breast, sister - melanoma, sister - kidney and brother with oral cancer (was a smoker).  She has 2 children both of whom are healthy. No miscarriages.  No swelling, tenderness, numbness or tingling in her extremities. No c/o joint or "bone" pain.  She has a good appetite and is eating healthy. She stays well hydrated. No significant weight loss or gain.   She was never a smoker. No ETOH.  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   ROS: All other 10 point review of systems is negative.   PAST MEDICAL HISTORY:   Past Medical History  Diagnosis Date  . HYPERTENSION   . OSTEOPENIA   . HYPERLIPIDEMIA   . Allergic rhinitis   . H/O retinal detachment   . H/O acute pancreatitis   . Family history of anesthesia complication     daughter hard to wake up  . Cancer [facility])     breast -left  . Hurthle cell neoplasm of thyroid     Right   . Hypothyroidism 05/31/2015  . Hurthle cell neoplasm of thyroid   . Primary cancer of upper outer quadrant of left female breast ([place])     Dr. Lindi Adie  . Radiation 11/11/15-12/30/14    left breast 50.4 gray, lumpectomy cavity boosted to 62.4 gray    ALLERGIES: Allergies  Allergen Reactions  . Codeine Anaphylaxis  . Carvedilol     REACTION: leg pain, edema  . Felodipine     REACTION: HA, insomnia  . Lisinopril     REACTION: HA, SWELLING  . Losartan Potassium     REACTION: cough  . Radiaplexrx [Pyridoxine-Zinc Picolinate] Itching and Rash      MEDICATIONS:  Current Outpatient Prescriptions on File Prior to Visit  Medication Sig Dispense Refill  . anastrozole (ARIMIDEX) 1 MG tablet Take 1 tablet (1 mg total) by mouth daily. (  Patient taking differently: Take 1 mg by mouth every morning. ) 90 tablet 3  . aspirin 81 MG EC tablet Take 81 mg by mouth every morning.     . calcium carbonate 1250 MG capsule Take 2,500 mg by mouth every morning.     . cholecalciferol (VITAMIN D) 1000 UNITS tablet Take 2,500 Units by mouth every morning.     . dorzolamide-timolol (COSOPT) 22.3-6.8 MG/ML ophthalmic solution Place 1 drop into both eyes every morning.     . fluticasone (CUTIVATE) 0.005 % ointment Apply 1 application topically 3 (three) times daily as needed. 30 g 3  . latanoprost (XALATAN) 0.005 % ophthalmic solution Place 1 drop into both eyes at bedtime.    Marland Kitchen levothyroxine (SYNTHROID, LEVOTHROID) 25 MCG tablet Take 1 tablet (25 mcg total) by mouth daily  before breakfast. 30 tablet 6  . lidocaine (LIDODERM) 5 % Place 1-2 patches onto the skin daily. Remove & Discard patch within 12 hours or as directed by MD 30 patch 0  . Multiple Vitamin (MULTIVITAMIN) tablet Take 1 tablet by mouth every morning.     . olmesartan (BENICAR) 40 MG tablet Take 1 tablet (40 mg total) by mouth daily. 90 tablet 2  . pravastatin (PRAVACHOL) 40 MG tablet Take 1 tablet (40 mg total) by mouth daily. 30 tablet 6   No current facility-administered medications on file prior to visit.     PAST SURGICAL HISTORY Past Surgical History  Procedure Laterality Date  . Tubal ligation    . Laser eye surgery, detached retina Left   . Breast biopsy Left 08/27/14  . Cholecystectomy  2000  . Tonsillectomy    . Dilation and curettage of uterus    . Colonoscopy    . Radioactive seed guided mastectomy with axillary sentinel lymph node biopsy Left 09/16/2014    Procedure: RADIOACTIVE SEED GUIDED PARTIAL MASTECTOMY WITH AXILLARY SENTINEL LYMPH NODE BIOPSY;  Surgeon: [name], MD;  Location: [facility];  Service: General;  Laterality: Left;  . Thyroid lobectomy N/A 02/10/2015    Procedure: RIGHT THYROID LOBECTOMY;  Surgeon: [name], MD;  Location: [facility];  Service: General;  Laterality: N/A;    FAMILY HISTORY: Family History  Problem Relation Age of Onset  . Colon cancer Neg Hx   . CAD Neg Hx   . Diabetes Neg Hx   . Breast cancer Sister 44  . Cancer Father     lung cancer ; smoker  . Cancer Brother     3 brothers with lung cancer, all smokers  . Cancer Paternal Uncle     2 pat uncles with lung cancer, smokers  . Kidney cancer Sister 47  . Melanoma Sister 72  . Ovarian cancer Other 53    niece with ovarian cancer (related through sister with breast cancer)    SOCIAL HISTORY:  reports that she has never smoked. She does not have any smokeless tobacco history on file. She reports that she does not drink alcohol or use illicit drugs.  PERFORMANCE  STATUS: The patient's performance status is 1 - Symptomatic but completely ambulatory  PHYSICAL EXAM: Most Recent Vital Signs: There were no vitals taken for this visit. BP 157/57 mmHg  Pulse 62  Temp(Src) 98.1 F (36.7 C) (Oral)  Resp 16  Ht 5' 3"  (1.6 m)  Wt 215 lb (97.523 kg)  BMI 38.09 kg/m2  General Appearance:    Alert, cooperative, no distress, appears stated age  Head:    Normocephalic, without obvious  abnormality, atraumatic  Eyes:    PERRL, conjunctiva/corneas clear, EOM's intact, fundi    benign, both eyes        Throat:   Lips, mucosa, and tongue normal; teeth and gums normal  Neck:   Supple, symmetrical, trachea midline, no adenopathy;    thyroid:  no enlargement/tenderness/nodules; no carotid   bruit or JVD  Back:     Symmetric, no curvature, ROM normal, no CVA tenderness  Lungs:     Clear to auscultation bilaterally, respirations unlabored  Chest Wall:    No tenderness or deformity   Heart:    Regular rate and rhythm, S1 and S2 normal, no murmur, rub   or gallop     Abdomen:     Soft, non-tender, bowel sounds active all four quadrants,    no masses, no organomegaly        Extremities:   Extremities normal, atraumatic, no cyanosis or edema  Pulses:   2+ and symmetric all extremities  Skin:   Skin color, texture, turgor normal, no rashes or lesions  Lymph nodes:   Cervical, supraclavicular, and axillary nodes normal  Neurologic:   CNII-XII intact, normal strength, sensation and reflexes    throughout   LABORATORY DATA:  Results for orders placed or performed in visit on 11/10/15 (from the past 48 hour(s))  CBC with Differential [facility])     Status: Abnormal   Collection Time: 11/10/15 11:28 AM  Result Value Ref Range   WBC 4.8 3.9 - 10.0 10e3/uL   RBC 3.43 (L) 3.70 - 5.32 10e6/uL   HGB 10.0 (L) 11.6 - 15.9 g/dL   HCT 31.8 (L) 34.8 - 46.6 %   MCV 93 81 - 101 fL   MCH 29.2 26.0 - 34.0 pg   MCHC 31.4 (L) 32.0 - 36.0 g/dL   RDW 14.5 11.1 - 15.7 %    Platelets 191 145 - 400 10e3/uL   NEUT# 3.4 1.5 - 6.5 10e3/uL   LYMPH# 0.9 0.9 - 3.3 10e3/uL   MONO# 0.3 0.1 - 0.9 10e3/uL   Eosinophils Absolute 0.2 0.0 - 0.5 10e3/uL   BASO# 0.0 0.0 - 0.2 10e3/uL   NEUT% 71.4 39.6 - 80.0 %   LYMPH% 18.6 14.0 - 48.0 %   MONO% 6.3 0.0 - 13.0 %   EOS% 3.3 0.0 - 7.0 %   BASO% 0.4 0.0 - 2.0 %  [facility] - Smear     Status: None   Collection Time: 11/10/15 11:28 AM  Result Value Ref Range   Smear Result Smear Available       RADIOGRAPHY: No results found.    PATHOLOGY: None  ASSESSMENT/PLAN: Ms. Jessop is a very pleasant 74 yo white female with a recent history of anemia. Her Hgb today is 10.0 with an MCV of 93. She is symptomatic with mild fatigue. She is still recuperating from pneumonia. She has finished the Avelox and she has a repeat chest xray in January.   We will see what her iron studies and other lab work shows. Her anemia may very well be medication induced.  Dr. Marin Olp viewed her blood smear and did not identify any abnormality or evidence of malignancy.   We will follow-up with her regarding treatment (if needed) and a follow-up appointment once we have the rest of her lab results.  All questions were answered. She knows to contact us with any hematologic problems, questions or concerns. We can certainly see her much sooner if necessary.  She was  discussed with and also seen by Dr. Marin Olp and he is in agreement with the aforementioned.   [facility]    Addendum:  I saw and examined the patient with Sarah. We did look at her blood on the microscope. I do not see anything that looked suspicious. I did not see any nucleated red blood cells. There is no rouleau formation. There may been some slight increase in redness so cytosis. She may have had some slight microcytic red cells.  She had lab work done. Her ferritin was 154 with iron saturation of only 11%. Her iron was only 30. As such, the ferritin is mostly an acute phase  reactant. She has some underlying inflammatory issues that could be contributing to the anemia.  Her reticulocyte count, corrected is less than 2%. She is definitely not hemolyzing.  Her SPEP did not show any obvious monoclonal spike area and she had normal immunoglobulin levels.  She does not have any obvious renal failure.  We did do an erythropoietin level on her. Her level was 37. This was not all that bad.  For now, I think it will be interesting to see what happens with IV iron administration. I think this would be very reasonable.  We spent about 45 minutes with her. We went over her labs. We went over our recommendations.  We will plan to get her back to see [name] in another 6 weeks or so.  Lum Keas

## 2015-11-11 LAB — IRON AND TIBC
%SAT: 11 % — ABNORMAL LOW (ref 21–57)
IRON: 30 ug/dL — AB (ref 41–142)
TIBC: 265 ug/dL (ref 236–444)
UIBC: 236 ug/dL (ref 120–384)

## 2015-11-11 LAB — FERRITIN: FERRITIN: 154 ng/mL (ref 9–269)

## 2015-11-14 ENCOUNTER — Other Ambulatory Visit: Payer: Self-pay | Admitting: *Deleted

## 2015-11-14 ENCOUNTER — Telehealth: Payer: Self-pay | Admitting: *Deleted

## 2015-11-14 DIAGNOSIS — D509 Iron deficiency anemia, unspecified: Secondary | ICD-10-CM

## 2015-11-14 LAB — ERYTHROPOIETIN: Erythropoietin: 36.6 m[IU]/mL — ABNORMAL HIGH (ref 2.6–18.5)

## 2015-11-14 NOTE — Telephone Encounter (Addendum)
Patient aware of results. Appointments made.   ----- Message from Eliezer Bottom, NP sent at 11/14/2015  2:02 PM EST ----- Regarding: iron Needs 1 dose of feraheme this week and another 8 days later. Iron saturation was 11%. Thanks!!!  Sarah    ----- Message -----    From: Volanda Napoleon, MD    Sent: 11/12/2015   1:09 PM      To: Eliezer Bottom, NP  Her iron is low!!  She needs 2 doses of feraheme!!  pete ----- Message -----    From: [facility] Interface    Sent: 11/10/2015  11:49 AM      To: Volanda Napoleon, MD

## 2015-11-15 LAB — RETICULOCYTES
ABS RETIC: 75.7 10*3/uL (ref 19.0–186.0)
RBC.: 3.44 MIL/uL — AB (ref 3.87–5.11)
Retic Ct Pct: 2.2 % (ref 0.4–2.3)

## 2015-11-15 LAB — PROTEIN ELECTROPHORESIS, SERUM, WITH REFLEX
Albumin ELP: 3.2 g/dL — ABNORMAL LOW (ref 3.8–4.8)
Alpha-1-Globulin: 0.4 g/dL — ABNORMAL HIGH (ref 0.2–0.3)
Alpha-2-Globulin: 0.9 g/dL (ref 0.5–0.9)
BETA 2: 0.3 g/dL (ref 0.2–0.5)
Beta Globulin: 0.4 g/dL (ref 0.4–0.6)
GAMMA GLOBULIN: 0.6 g/dL — AB (ref 0.8–1.7)
TOTAL PROTEIN, SERUM ELECTROPHOR: 5.7 g/dL — AB (ref 6.1–8.1)

## 2015-11-15 LAB — HEMOGLOBINOPATHY EVALUATION
HGB A: 97.6 % (ref 96.8–97.8)
HGB S QUANTITAION: 0 %
Hemoglobin Other: 0 %
Hgb A2 Quant: 2.4 % (ref 2.2–3.2)
Hgb F Quant: 0 % (ref 0.0–2.0)

## 2015-11-15 LAB — IGG, IGA, IGM
IGM, SERUM: 54 mg/dL (ref 52–322)
IgA: 173 mg/dL (ref 69–380)
IgG (Immunoglobin G), Serum: 740 mg/dL (ref 690–1700)

## 2015-11-15 LAB — IFE INTERPRETATION

## 2015-11-16 ENCOUNTER — Ambulatory Visit (HOSPITAL_BASED_OUTPATIENT_CLINIC_OR_DEPARTMENT_OTHER): Payer: Medicare Other

## 2015-11-16 VITALS — BP 170/56 | HR 57 | Temp 97.9°F | Resp 16

## 2015-11-16 DIAGNOSIS — D509 Iron deficiency anemia, unspecified: Secondary | ICD-10-CM

## 2015-11-16 MED ORDER — SODIUM CHLORIDE 0.9 % IV SOLN
INTRAVENOUS | Status: DC
  Administered 2015-11-16: 12:00:00 via INTRAVENOUS

## 2015-11-16 MED ORDER — FERUMOXYTOL INJECTION 510 MG/17 ML
510.0000 mg | Freq: Once | INTRAVENOUS | Status: AC
  Administered 2015-11-16: 510 mg via INTRAVENOUS
  Filled 2015-11-16: qty 17

## 2015-11-16 NOTE — Patient Instructions (Signed)

## 2015-11-23 ENCOUNTER — Ambulatory Visit (HOSPITAL_BASED_OUTPATIENT_CLINIC_OR_DEPARTMENT_OTHER)
Admission: RE | Admit: 2015-11-23 | Discharge: 2015-11-23 | Disposition: A | Discharge status: Home / Self Care | Payer: Medicare Other | Source: Ambulatory Visit | Attending: Internal Medicine | Admitting: Internal Medicine

## 2015-11-23 ENCOUNTER — Ambulatory Visit (HOSPITAL_BASED_OUTPATIENT_CLINIC_OR_DEPARTMENT_OTHER): Payer: Medicare Other

## 2015-11-23 VITALS — BP 153/70 | HR 55 | Temp 98.2°F | Resp 18

## 2015-11-23 DIAGNOSIS — D509 Iron deficiency anemia, unspecified: Secondary | ICD-10-CM

## 2015-11-23 DIAGNOSIS — J189 Pneumonia, unspecified organism: Secondary | ICD-10-CM | POA: Diagnosis not present

## 2015-11-23 DIAGNOSIS — J13 Pneumonia due to Streptococcus pneumoniae: Secondary | ICD-10-CM | POA: Insufficient documentation

## 2015-11-23 MED ORDER — SODIUM CHLORIDE 0.9 % IV SOLN
510.0000 mg | Freq: Once | INTRAVENOUS | Status: AC
  Administered 2015-11-23: 510 mg via INTRAVENOUS
  Filled 2015-11-23: qty 17

## 2015-11-23 MED ORDER — SODIUM CHLORIDE 0.9 % IV SOLN
INTRAVENOUS | Status: DC
  Administered 2015-11-23: 12:00:00 via INTRAVENOUS

## 2015-11-23 NOTE — Patient Instructions (Signed)

## 2015-11-24 ENCOUNTER — Other Ambulatory Visit: Payer: Self-pay

## 2015-11-24 DIAGNOSIS — R9389 Abnormal findings on diagnostic imaging of other specified body structures: Secondary | ICD-10-CM

## 2015-11-29 ENCOUNTER — Ambulatory Visit (INDEPENDENT_AMBULATORY_CARE_PROVIDER_SITE_OTHER): Payer: Medicare Other | Admitting: Internal Medicine

## 2015-11-29 ENCOUNTER — Encounter: Payer: Self-pay | Admitting: Internal Medicine

## 2015-11-29 VITALS — BP 126/84 | HR 60 | Temp 97.6°F | Ht 63.0 in | Wt 214.5 lb

## 2015-11-29 DIAGNOSIS — J189 Pneumonia, unspecified organism: Secondary | ICD-10-CM | POA: Diagnosis not present

## 2015-11-29 DIAGNOSIS — J309 Allergic rhinitis, unspecified: Secondary | ICD-10-CM

## 2015-11-29 DIAGNOSIS — D649 Anemia, unspecified: Secondary | ICD-10-CM | POA: Diagnosis not present

## 2015-11-29 MED ORDER — AZELASTINE HCL 0.1 % NA SOLN: 2.0000 | Freq: Every evening | NASAL | Status: DC | PRN

## 2015-11-29 NOTE — Patient Instructions (Signed)
  For nasal congestion: Use OTC Nasocort or Flonase : 2 nasal sprays on each side of the nose in the morning until you feel better Use ASTELIN a prescribed spray : 2 nasal sprays on each side of the nose at night until you feel better Also use Claritin 10 mg OTC 1 tablet daily for 10 days Call if not gradually better over the next  2 weeks  You need a chest x-ray at the end of this month   Schedule an appointment for a routine checkup by March 2017

## 2015-11-29 NOTE — Progress Notes (Signed)
Pre visit review using our clinic review tool, if applicable. No additional management support is needed unless otherwise documented below in the visit note. 

## 2015-11-29 NOTE — Progress Notes (Signed)
Subjective:    Patient ID: Lori Joseph, female    DOB: 22-Mar-1941, 75 y.o.   MRN: [id number]  DOS:  11/29/2015 Type of visit - description : Here for a checkup Interval history: Her main concern today is sinus congestion: Has not resolved since that time she was diagnosed with pneumonia, sinus congestion is bilateral, + clear nasal discharge, Robitussin-DM no helping much, Flonase does help to some extent. We also talk about her recent pneumonia and anemia.   Review of Systems  denies fever chills No chest pain or difficulty breathing She does have mild cough, she feels is due to postnasal dripping. No chest congestion.  Past Medical History  Diagnosis Date  . HYPERTENSION   . OSTEOPENIA   . HYPERLIPIDEMIA   . Allergic rhinitis   . H/O retinal detachment   . H/O acute pancreatitis   . Family history of anesthesia complication     daughter hard to wake up  . Cancer [facility])     breast -left  . Hurthle cell neoplasm of thyroid     Right   . Hypothyroidism 05/31/2015  . Hurthle cell neoplasm of thyroid   . Primary cancer of upper outer quadrant of left female breast ([place])     Dr. Lindi Adie  . Radiation 11/11/15-12/30/14    left breast 50.4 gray, lumpectomy cavity boosted to 62.4 gray    Past Surgical History  Procedure Laterality Date  . Tubal ligation    . Laser eye surgery, detached retina Left   . Breast biopsy Left 08/27/14  . Cholecystectomy  2000  . Tonsillectomy    . Dilation and curettage of uterus    . Colonoscopy    . Radioactive seed guided mastectomy with axillary sentinel lymph node biopsy Left 09/16/2014    Procedure: RADIOACTIVE SEED GUIDED PARTIAL MASTECTOMY WITH AXILLARY SENTINEL LYMPH NODE BIOPSY;  Surgeon: [name], MD;  Location: [facility];  Service: General;  Laterality: Left;  . Thyroid lobectomy N/A 02/10/2015    Procedure: RIGHT THYROID LOBECTOMY;  Surgeon: [name], MD;  Location: [facility];  Service: General;  Laterality: N/A;     Social History   Social History  . Marital Status: Married    Spouse Name: Ruthann Cancer  . Number of Children: 2  . 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 Smoking status: Never Smoker   . Smokeless tobacco: Not on file  . Alcohol Use: No     Comment: rarely wine  . Drug Use: No  . Sexual Activity: Not on file   Other Topics Concern  . Not on file   Social History Narrative   Lives w/ husband        Medication List       This list is accurate as of: 11/29/15  6:17 PM.  Always use your most recent med list.               anastrozole 1 MG tablet  Commonly known as:  ARIMIDEX  Take 1 tablet (1 mg total) by mouth daily.     aspirin 81 MG EC tablet  Take 81 mg by mouth every morning.     azelastine 0.1 % nasal spray  Commonly known as:  ASTELIN  Place 2 sprays into both nostrils at bedtime as needed for rhinitis. Use in each nostril as directed     calcium carbonate 1250 MG capsule  Take 2,500 mg by mouth every morning.     cholecalciferol 1000 units tablet  Commonly known as:  VITAMIN D  Take 2,500 Units by mouth every morning.     dorzolamide-timolol 22.3-6.8 MG/ML ophthalmic solution  Commonly known as:  COSOPT  Place 1 drop into both eyes every morning.     fluticasone 0.005 % ointment  Commonly known as:  CUTIVATE  Apply 1 application topically 3 (three) times daily as needed.     latanoprost 0.005 % ophthalmic solution  Commonly known as:  XALATAN  Place 1 drop into both eyes at bedtime.     levothyroxine 25 MCG tablet  Commonly known as:  SYNTHROID, LEVOTHROID  Take 1 tablet (25 mcg total) by mouth daily before breakfast.     lidocaine 5 %  Commonly known as:  LIDODERM  Place 1-2 patches onto the skin daily. Remove & Discard patch within 12 hours or as directed by MD     multivitamin tablet  Take 1 tablet by mouth every morning.     olmesartan 40 MG tablet   Commonly known as:  BENICAR  Take 1 tablet (40 mg total) by mouth daily.     pravastatin 40 MG tablet  Commonly known as:  PRAVACHOL  Take 1 tablet (40 mg total) by mouth daily.           Objective:   Physical Exam BP 126/84 mmHg  Pulse 60  Temp(Src) 97.6 F (36.4 C) (Oral)  Ht 5\' 3"  (1.6 m)  Wt 214 lb 8 oz (97.297 kg)  BMI 38.01 kg/m2  SpO2 98% General:   Well developed, well nourished . NAD.  HEENT:  Normocephalic . Face symmetric, atraumatic. TMs: Normal. Nose congested, sinuses no TTP. Throat symmetric, no red Lungs:  CTA B Normal respiratory effort, no intercostal retractions, no accessory muscle use. Heart: RRR,  no murmur.  No pretibial edema bilaterally  Skin: Not pale. Not jaundice Neurologic:  alert & oriented X3.  Speech normal, gait appropriate for age and unassisted Psych--  Cognition and judgment appear intact.  Cooperative with normal attention span and concentration.  Behavior appropriate. No anxious or depressed appearing.      Assessment & Plan:   Assessment > HTN Hyperlipidemia Hypothyroidism Osteopenia: T score 2010 normal, T score 2016 (1.2), T score 05-2012 (-1.2), on calcium and vitamin D Oncology: --Breast cancer, left, dx  2015, XRT 12/2-16 to 12/2014, on Arimidex --Hurthle Cell neoplasm of the thyroid, thyroidectomy 01-2015 ANEMIA- saw hematology, rx IV iron 10-2015 Chronic right-sided chest pain x-rays CT abdomen and pelvis 2016 negative, saw pain mngmt, ortho ---> better with Lidoderm patch and a local injection in the back Glaucoma suspect  H/o acute pancreatitis H/o vertebral detachment ++ FH Lung Ca (pt is not a smoker)  PLAN: Pneumonia: Chest x-ray 11/15/2015 still show infiltrate but no clinical  evidence of active pneumonia, redo CXR ~12-25-15 Allergic rhinitis: sx as described in the HPI likely dt allergies, doubt bacterial sinusitis. Continue Flonase, add Astelin, Claritin if no better will call, prednisone 20 mg 5  days? Fatigue: Resolved Anemia: Found to be iron deficient, on IV iron per hematology RTC 01-2016 for a routine visit

## 2015-12-13 ENCOUNTER — Other Ambulatory Visit: Payer: Self-pay | Admitting: Internal Medicine

## 2015-12-13 DIAGNOSIS — Z01419 Encounter for gynecological examination (general) (routine) without abnormal findings: Secondary | ICD-10-CM | POA: Diagnosis not present

## 2015-12-13 DIAGNOSIS — R898 Other abnormal findings in specimens from other organs, systems and tissues: Secondary | ICD-10-CM | POA: Diagnosis not present

## 2015-12-13 DIAGNOSIS — Z6838 Body mass index (BMI) 38.0-38.9, adult: Secondary | ICD-10-CM | POA: Diagnosis not present

## 2015-12-16 ENCOUNTER — Other Ambulatory Visit: Payer: Self-pay

## 2015-12-16 MED ORDER — LEVOTHYROXINE SODIUM 25 MCG PO TABS: 25.0000 ug | ORAL_TABLET | Freq: Every day | ORAL | Status: DC

## 2015-12-21 ENCOUNTER — Other Ambulatory Visit: Payer: Self-pay | Admitting: Hematology and Oncology

## 2015-12-21 NOTE — Telephone Encounter (Signed)
Chart reviewed.

## 2016-01-13 ENCOUNTER — Telehealth: Payer: Self-pay | Admitting: *Deleted

## 2016-01-13 ENCOUNTER — Ambulatory Visit (HOSPITAL_BASED_OUTPATIENT_CLINIC_OR_DEPARTMENT_OTHER)
Admission: RE | Admit: 2016-01-13 | Discharge: 2016-01-13 | Disposition: A | Discharge status: Home / Self Care | Payer: Medicare Other | Source: Ambulatory Visit | Attending: Internal Medicine | Admitting: Internal Medicine

## 2016-01-13 DIAGNOSIS — R918 Other nonspecific abnormal finding of lung field: Secondary | ICD-10-CM | POA: Insufficient documentation

## 2016-01-13 DIAGNOSIS — Z8701 Personal history of pneumonia (recurrent): Secondary | ICD-10-CM | POA: Diagnosis not present

## 2016-01-13 DIAGNOSIS — R938 Abnormal findings on diagnostic imaging of other specified body structures: Secondary | ICD-10-CM | POA: Insufficient documentation

## 2016-01-13 DIAGNOSIS — R9389 Abnormal findings on diagnostic imaging of other specified body structures: Secondary | ICD-10-CM

## 2016-01-13 DIAGNOSIS — J189 Pneumonia, unspecified organism: Secondary | ICD-10-CM

## 2016-01-13 DIAGNOSIS — J984 Other disorders of lung: Secondary | ICD-10-CM | POA: Diagnosis not present

## 2016-01-13 MED ORDER — AZITHROMYCIN 250 MG PO TABS: ORAL_TABLET | ORAL | Status: DC

## 2016-01-13 NOTE — Telephone Encounter (Signed)
Received call from [place] w/ [facility] regarding pt's chest x-ray results. Imaging results also available in chart review.  IMPRESSION: 1. Right lower lobe pneumonia has resolved but there is NEW left lower lobe airspace disease suspicious for acute pneumonia. No pleural effusion. 2. Followup PA and lateral chest X-ray is recommended in 3-4 weeks following trial of antibiotic therapy to ensure resolution and exclude underlying malignancy.

## 2016-01-13 NOTE — Telephone Encounter (Signed)
I contacted the patient, she feels well except for some postnasal dripping. Denies fever, chills, chest pain, does have occasional sweats at night. Plan: Second round of antibiotic, Z-Pak. Refer to pulmonary.

## 2016-01-18 DIAGNOSIS — C50412 Malignant neoplasm of upper-outer quadrant of left female breast: Secondary | ICD-10-CM | POA: Diagnosis not present

## 2016-01-18 DIAGNOSIS — D34 Benign neoplasm of thyroid gland: Secondary | ICD-10-CM | POA: Diagnosis not present

## 2016-01-30 ENCOUNTER — Encounter: Payer: Self-pay | Admitting: Internal Medicine

## 2016-01-30 ENCOUNTER — Ambulatory Visit (INDEPENDENT_AMBULATORY_CARE_PROVIDER_SITE_OTHER)
Admission: RE | Admit: 2016-01-30 | Discharge: 2016-01-30 | Disposition: A | Discharge status: Home / Self Care | Payer: Medicare Other | Source: Ambulatory Visit | Attending: Internal Medicine | Admitting: Internal Medicine

## 2016-01-30 ENCOUNTER — Other Ambulatory Visit (INDEPENDENT_AMBULATORY_CARE_PROVIDER_SITE_OTHER): Payer: Medicare Other

## 2016-01-30 ENCOUNTER — Ambulatory Visit (INDEPENDENT_AMBULATORY_CARE_PROVIDER_SITE_OTHER): Payer: Medicare Other | Admitting: Internal Medicine

## 2016-01-30 VITALS — BP 154/88 | HR 57 | Ht 63.5 in | Wt 225.0 lb

## 2016-01-30 DIAGNOSIS — R918 Other nonspecific abnormal finding of lung field: Secondary | ICD-10-CM

## 2016-01-30 DIAGNOSIS — J189 Pneumonia, unspecified organism: Secondary | ICD-10-CM | POA: Diagnosis not present

## 2016-01-30 DIAGNOSIS — R05 Cough: Secondary | ICD-10-CM

## 2016-01-30 DIAGNOSIS — R058 Other specified cough: Secondary | ICD-10-CM

## 2016-01-30 LAB — CBC WITH DIFFERENTIAL/PLATELET
BASOS PCT: 0.6 % (ref 0.0–3.0)
Basophils Absolute: 0 10*3/uL (ref 0.0–0.1)
EOS ABS: 0.1 10*3/uL (ref 0.0–0.7)
Eosinophils Relative: 3.2 % (ref 0.0–5.0)
HCT: 33.2 % — ABNORMAL LOW (ref 36.0–46.0)
Hemoglobin: 11.3 g/dL — ABNORMAL LOW (ref 12.0–15.0)
Lymphocytes Relative: 22.4 % (ref 12.0–46.0)
Lymphs Abs: 1 10*3/uL (ref 0.7–4.0)
MCHC: 34.1 g/dL (ref 30.0–36.0)
MCV: 91 fl (ref 78.0–100.0)
MONO ABS: 0.3 10*3/uL (ref 0.1–1.0)
Monocytes Relative: 5.8 % (ref 3.0–12.0)
NEUTROS ABS: 3 10*3/uL (ref 1.4–7.7)
Neutrophils Relative %: 68 % (ref 43.0–77.0)
PLATELETS: 186 10*3/uL (ref 150.0–400.0)
RBC: 3.65 Mil/uL — ABNORMAL LOW (ref 3.87–5.11)
RDW: 15.1 % (ref 11.5–15.5)
WBC: 4.4 10*3/uL (ref 4.0–10.5)

## 2016-01-30 LAB — SEDIMENTATION RATE: Sed Rate: 43 mm/hr — ABNORMAL HIGH (ref 0–22)

## 2016-01-30 NOTE — Progress Notes (Signed)
Subjective:    Patient ID: Lori Joseph, female    DOB: [birth year],     MRN: [id number]  HPI   80 yowf never smoker with sensation of pnds x year round, ? Worse in spring with onset  around  2012/2013 then "bad sinus infection" early Nov 2016 > Dr Larose Kells cxr 10/14/15 c/w R  PNa > Avelox with persistent sense of congestion x 6 weeks gradually resolved but  cxr ? New L pna 01/13/16  so rx zpak and referred to pulmonary clinic 01/30/2016 by Dr Larose Kells.   01/30/2016 1st [facility] office visit/ Lori Joseph   Chief Complaint  Patient presents with  . Pulmonary Consult    Referred by Dr. Kathlene November.  Pt states she has had PNA multiple times since Nov 2016. She states that her breathing is doing well. She only c/o minimal PND.    vague sense of daytime pnds x years not really  sure if worse in springtime, not aware hs or early am and    Not limited by breathing from desired activities.  Did have RT to L breast x 35 rx completed 12/30/14.   No obvious other patterns in day to day or daytime variabilty or assoc chronic cough or cp or chest tightness, subjective wheeze overt  hb symptoms. No unusual exp hx or h/o childhood pna/ asthma or knowledge of premature birth.  Sleeping ok without nocturnal  or early am exacerbation  of respiratory  c/o's or need for noct saba. Also denies any obvious fluctuation of symptoms with weather or environmental changes or other aggravating or alleviating factors except as outlined above   Current Medications, Allergies, Complete Past Medical History, Past Surgical History, Family History, and Social History were reviewed in [employer] record.               Review of Systems  Constitutional: Negative for fever, chills and unexpected weight change.  HENT: Positive for postnasal drip. Negative for congestion, dental problem, ear pain, nosebleeds, rhinorrhea, sinus pressure, sneezing, sore throat, trouble swallowing and voice change.   Eyes: Negative for  visual disturbance.  Respiratory: Negative for cough, choking and shortness of breath.   Cardiovascular: Negative for chest pain and leg swelling.  Gastrointestinal: Negative for vomiting, abdominal pain and diarrhea.  Genitourinary: Negative for difficulty urinating.  Musculoskeletal: Negative for arthralgias.  Skin: Negative for rash.  Neurological: Negative for tremors, syncope and headaches.  Hematological: Does not bruise/bleed easily.       Objective:   Physical Exam  amb wm unusual affect  Wt Readings from Last 3 Encounters:  01/30/16 225 lb (102.059 kg)  11/29/15 214 lb 8 oz (97.297 kg)  11/10/15 215 lb (97.523 kg)    Vital signs reviewed   HEENT: nl dentition, turbinates, and oropharynx. Nl external ear canals without cough reflex   NECK :  without JVD/Nodes/TM/ nl carotid upstrokes bilaterally   LUNGS: no acc muscle use,  Nl contour chest which is clear to A and P bilaterally without cough on insp or exp maneuvers   CV:  RRR  no s3 or murmur or increase in P2, no edema   ABD:  soft and nontender with nl inspiratory excursion in the supine position. No bruits or organomegaly, bowel sounds nl  MS:  Nl gait/ ext warm without deformities, calf tenderness, cyanosis or clubbing No obvious joint restrictions   SKIN: warm and dry without lesions    NEURO:  alert, approp, nl sensorium with  no motor deficits     CXR PA and Lateral:   01/30/2016 :    I personally reviewed images and agree with radiology impression as follows:    Further increase in density in the left lower lobe. If the patient has already undergone antibiotic therapy, chest CT scanning now is recommended to exclude neoplasm. If the patient has not undergone antibiotic therapy, such therapy followed by chest x-ray in 2-3 weeks could be considered.   Labs ordered 01/30/2016  = esr, eos/allergy profile      Assessment & Plan:

## 2016-01-30 NOTE — Patient Instructions (Addendum)
Please remember to go to the lab and x-ray department downstairs for your tests - we will call you with the results when they are available.  No need for pulmonary follow up unless studies indicate otherwise  Late add:  Ordered CT chest and sinus

## 2016-01-30 NOTE — Assessment & Plan Note (Addendum)
Classic Upper airway cough syndrome, so named because it's frequently impossible to sort out how much is  CR/sinusitis with freq throat clearing (which can be related to primary GERD)   vs  causing  secondary (" extra esophageal")  GERD from wide swings in gastric pressure that occur with throat clearing, often  promoting self use of mint and menthol lozenges that reduce the lower esophageal sphincter tone and exacerbate the problem further in a cyclical fashion.   These are the same pts (now being labeled as having "irritable larynx syndrome" by some cough centers) who not infrequently have a history of having failed to tolerate ace inhibitors (as is the case here) ,  dry powder inhalers or biphosphonates or report having atypical reflux symptoms that don't respond to standard doses of PPI , and are easily confused as having aecopd or asthma flares by even experienced allergists/ pulmonologists.   rec allergy profile/ sinus CT > no need to change rx for now  Pending results of studies   Total time devoted to counseling  = 35/109m review case with pt/ discussion of options/alternatives/ personally creating in presence of pt  then going over specific  Instructions directly with the pt including how to use all of the meds but in particular covering each new medication in detail (see avs)

## 2016-01-30 NOTE — Assessment & Plan Note (Signed)
She is presently asymptomatic and most likely this is from RT to Right chest remotely > rec hrct to sort out   Discussed in detail all the  indications, usual  risks and alternatives  relative to the benefits with patient who agrees to proceed with conservative f/u as outlined

## 2016-01-31 ENCOUNTER — Encounter: Payer: Self-pay | Admitting: *Deleted

## 2016-01-31 LAB — RESPIRATORY ALLERGY PROFILE REGION II ~~LOC~~
Allergen, Comm Silver Birch, t9: <0.1 kU/L
Allergen, Cottonwood, t14: <0.1 kU/L
Allergen, D pternoyssinus,d7: <0.1 kU/L
Allergen, Mouse Urine Protein, e78: <0.1 kU/L
Allergen, Mulberry, t76: <0.1 kU/L
Aspergillus fumigatus, m3: <0.1 kU/L
Bermuda Grass: <0.1 kU/L
Cladosporium Herbarum: <0.1 kU/L
Cockroach: <0.1 kU/L
Common Ragweed: <0.1 kU/L
Dog Dander: <0.1 kU/L
Elm IgE: <0.1 kU/L
IgE (Immunoglobulin E), Serum: <2 kU/L (ref <115)
Pecan/Hickory Tree IgE: <0.1 kU/L
Timothy Grass: <0.1 kU/L

## 2016-01-31 NOTE — Progress Notes (Signed)
Quick Note:  Spoke with pt and notified of results per Dr. Wert. Pt verbalized understanding and denied any questions.  ______ 

## 2016-02-01 ENCOUNTER — Ambulatory Visit (INDEPENDENT_AMBULATORY_CARE_PROVIDER_SITE_OTHER)
Admission: RE | Admit: 2016-02-01 | Discharge: 2016-02-01 | Disposition: A | Discharge status: Home / Self Care | Payer: Medicare Other | Source: Ambulatory Visit | Attending: Internal Medicine | Admitting: Internal Medicine

## 2016-02-01 DIAGNOSIS — H1132 Conjunctival hemorrhage, left eye: Secondary | ICD-10-CM | POA: Diagnosis not present

## 2016-02-01 DIAGNOSIS — R05 Cough: Secondary | ICD-10-CM

## 2016-02-01 DIAGNOSIS — R058 Other specified cough: Secondary | ICD-10-CM

## 2016-02-01 DIAGNOSIS — R0981 Nasal congestion: Secondary | ICD-10-CM | POA: Diagnosis not present

## 2016-02-01 DIAGNOSIS — R918 Other nonspecific abnormal finding of lung field: Secondary | ICD-10-CM

## 2016-02-01 DIAGNOSIS — H2513 Age-related nuclear cataract, bilateral: Secondary | ICD-10-CM | POA: Diagnosis not present

## 2016-02-01 DIAGNOSIS — C50912 Malignant neoplasm of unspecified site of left female breast: Secondary | ICD-10-CM | POA: Diagnosis not present

## 2016-02-01 DIAGNOSIS — J90 Pleural effusion, not elsewhere classified: Secondary | ICD-10-CM | POA: Diagnosis not present

## 2016-02-01 DIAGNOSIS — H40023 Open angle with borderline findings, high risk, bilateral: Secondary | ICD-10-CM | POA: Diagnosis not present

## 2016-02-10 ENCOUNTER — Institutional Professional Consult (permissible substitution): Payer: Medicare Other | Admitting: Internal Medicine

## 2016-02-21 ENCOUNTER — Ambulatory Visit (INDEPENDENT_AMBULATORY_CARE_PROVIDER_SITE_OTHER): Payer: Medicare Other | Admitting: Internal Medicine

## 2016-02-21 ENCOUNTER — Encounter: Payer: Self-pay | Admitting: Internal Medicine

## 2016-02-21 VITALS — BP 126/74 | HR 68 | Temp 98.2°F | Ht 64.0 in | Wt 220.5 lb

## 2016-02-21 DIAGNOSIS — I10 Essential (primary) hypertension: Secondary | ICD-10-CM | POA: Diagnosis not present

## 2016-02-21 DIAGNOSIS — E890 Postprocedural hypothyroidism: Secondary | ICD-10-CM | POA: Diagnosis not present

## 2016-02-21 DIAGNOSIS — L309 Dermatitis, unspecified: Secondary | ICD-10-CM | POA: Diagnosis not present

## 2016-02-21 DIAGNOSIS — Z09 Encounter for follow-up examination after completed treatment for conditions other than malignant neoplasm: Secondary | ICD-10-CM

## 2016-02-21 MED ORDER — FLUTICASONE PROPIONATE 0.005 % EX OINT: 1.0000 "application " | TOPICAL_OINTMENT | Freq: Three times a day (TID) | CUTANEOUS | Status: DC | PRN

## 2016-02-21 NOTE — Patient Instructions (Signed)
gO TO THE FRONT DESK  Schedule your next appointment for a  CHECK UP  When?   By [id number]  Fasting?  Yes

## 2016-02-21 NOTE — Assessment & Plan Note (Signed)
Pneumonia, abnormal chest x-ray and CT chest: Now f/u by pulmonary, plan to redo a chest x-ray in few weeks. HTN: Last BMP satisfactory, no change Hypothyroidism: Continue Synthroid Eczema/dry skin: Refill Cutivate, use of aveeno RTC 06-2016

## 2016-02-21 NOTE — Progress Notes (Signed)
Subjective:    Patient ID: Lori Joseph, female    DOB: 1941/01/31, 75 y.o.   MRN: [id number]  DOS:  02/21/2016 Type of visit - description : Routine office visit Interval history: No major concerns Note from pulmonary reviewed. Medications reviewed, good compliance without apparent side effects. Reports pretibial skin is dry, itchy. Needs a refill on her topical steroids.   Review of Systems Denies fever, chills. No chest pain or difficulty breathing No cough No nausea, vomiting, diarrhea or blood in the stools.  Past Medical History  Diagnosis Date  . HYPERTENSION   . OSTEOPENIA   . HYPERLIPIDEMIA   . Allergic rhinitis   . H/O retinal detachment   . H/O acute pancreatitis   . Family history of anesthesia complication     daughter hard to wake up  . Cancer [facility])     breast -left  . Hurthle cell neoplasm of thyroid     Right   . Hypothyroidism 05/31/2015  . Primary cancer of upper outer quadrant of left female breast ([place])     Dr. Lindi Adie  . Radiation 11/11/15-12/30/14    left breast 50.4 gray, lumpectomy cavity boosted to 62.4 gray    Past Surgical History  Procedure Laterality Date  . Tubal ligation    . Laser eye surgery, detached retina Left   . Breast biopsy Left 08/27/14  . Cholecystectomy  2000  . Tonsillectomy    . Dilation and curettage of uterus    . Colonoscopy    . Radioactive seed guided mastectomy with axillary sentinel lymph node biopsy Left 09/16/2014    Procedure: RADIOACTIVE SEED GUIDED PARTIAL MASTECTOMY WITH AXILLARY SENTINEL LYMPH NODE BIOPSY;  Surgeon: [name], MD;  Location: [facility];  Service: General;  Laterality: Left;  . Thyroid lobectomy N/A 02/10/2015    Procedure: RIGHT THYROID LOBECTOMY;  Surgeon: [name], MD;  Location: [facility];  Service: General;  Laterality: N/A;    Social History   Social History  . Marital Status: Married    Spouse Name: Ruthann Cancer  . Number of Children: 2  . Years of Education: N/A    Occupational History  . 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 Smoking status: Never Smoker   . Smokeless tobacco: Never Used  . Alcohol Use: No     Comment: rarely wine  . Drug Use: No  . Sexual Activity: Not on file   Other Topics Concern  . Not on file   Social History Narrative   Lives w/ husband        Medication List       This list is accurate as of: 02/21/16  6:10 PM.  Always use your most recent med list.               anastrozole 1 MG tablet  Commonly known as:  ARIMIDEX  TAKE 1 TABLET BY MOUTH EVERY DAY     aspirin 81 MG EC tablet  Take 81 mg by mouth every morning.     azelastine 0.1 % nasal spray  Commonly known as:  ASTELIN  Place 2 sprays into both nostrils at bedtime as needed for rhinitis. Use in each nostril as directed     calcium carbonate 1250 MG capsule  Take 2,500 mg by mouth every morning.     cholecalciferol 1000 units tablet  Commonly known as:  VITAMIN D  Take 2,500 Units by mouth every morning.  dorzolamide-timolol 22.3-6.8 MG/ML ophthalmic solution  Commonly known as:  COSOPT  Place 1 drop into both eyes every morning.     fluticasone 0.005 % ointment  Commonly known as:  CUTIVATE  Apply 1 application topically 3 (three) times daily as needed.     latanoprost 0.005 % ophthalmic solution  Commonly known as:  XALATAN  Place 1 drop into both eyes at bedtime.     levothyroxine 25 MCG tablet  Commonly known as:  SYNTHROID, LEVOTHROID  Take 1 tablet (25 mcg total) by mouth daily before breakfast.     multivitamin tablet  Take 1 tablet by mouth every morning.     olmesartan 40 MG tablet  Commonly known as:  BENICAR  Take 1 tablet (40 mg total) by mouth daily.     pravastatin 40 MG tablet  Commonly known as:  PRAVACHOL  Take 1 tablet (40 mg total) by mouth daily.           Objective:   Physical Exam BP 126/74 mmHg  Pulse 68  Temp(Src) 98.2 F (36.8 C) (Oral)  Ht 5\' 4"  (1.626 m)  Wt  220 lb 8 oz (100.018 kg)  BMI 37.83 kg/m2  SpO2 97% General:   Well developed, well nourished . NAD.  HEENT:  Normocephalic . Face symmetric, atraumatic Lungs:  Slightly decreased breath sounds bilaterally otherwise clear  Normal respiratory effort, no intercostal retractions, no accessory muscle use. Heart: RRR,  no murmur.  No pretibial edema bilaterally  Skin: Pretibial skin dry, some excoriations from scratching.   Neurologic:  alert & oriented X3.  Speech normal, gait appropriate for age and unassisted Psych--  Cognition and judgment appear intact.  Cooperative with normal attention span and concentration.  Behavior appropriate. No anxious or depressed appearing.      Assessment & Plan:   Assessment > HTN Hyperlipidemia Hypothyroidism Osteopenia: T score 2010 normal, T score 2016 (1.2), T score 05-2012 (-1.2), on calcium and vitamin D Oncology: --Breast cancer, left, dx  2015, XRT 12/2-16 to 12/2014, on Arimidex --Hurthle Cell neoplasm of the thyroid, thyroidectomy 01-2015 ANEMIA- saw hematology, rx IV iron 10-2015 Chronic right-sided chest pain x-rays CT abdomen and pelvis 2016 negative, saw pain mngmt, ortho ---> better with Lidoderm patch and a local injection in the back Glaucoma suspect  H/o acute pancreatitis H/o vertebral detachment ++ FH Lung Ca (pt is not a smoker)  PLAN: Pneumonia, abnormal chest x-ray and CT chest: Now f/u by pulmonary, plan to redo a chest x-ray in few weeks. HTN: Last BMP satisfactory, no change Hypothyroidism: Continue Synthroid Eczema/dry skin: Refill Cutivate, use of aveeno RTC 06-2016

## 2016-02-21 NOTE — Progress Notes (Signed)
Pre visit review using our clinic review tool, if applicable. No additional management support is needed unless otherwise documented below in the visit note. 

## 2016-03-27 ENCOUNTER — Ambulatory Visit (INDEPENDENT_AMBULATORY_CARE_PROVIDER_SITE_OTHER)
Admission: RE | Admit: 2016-03-27 | Discharge: 2016-03-27 | Disposition: A | Discharge status: Home / Self Care | Payer: Medicare Other | Source: Ambulatory Visit | Attending: Internal Medicine | Admitting: Internal Medicine

## 2016-03-27 ENCOUNTER — Other Ambulatory Visit: Payer: Self-pay | Admitting: Internal Medicine

## 2016-03-27 DIAGNOSIS — R918 Other nonspecific abnormal finding of lung field: Secondary | ICD-10-CM

## 2016-03-27 DIAGNOSIS — J1189 Influenza due to unidentified influenza virus with other manifestations: Secondary | ICD-10-CM | POA: Diagnosis not present

## 2016-03-28 NOTE — Progress Notes (Signed)
Quick Note:  Spoke with pt and notified of results per Dr. Wert. Pt verbalized understanding and denied any questions.  ______ 

## 2016-04-02 DIAGNOSIS — C50412 Malignant neoplasm of upper-outer quadrant of left female breast: Secondary | ICD-10-CM | POA: Diagnosis not present

## 2016-06-06 DIAGNOSIS — H5203 Hypermetropia, bilateral: Secondary | ICD-10-CM | POA: Diagnosis not present

## 2016-06-06 DIAGNOSIS — H40003 Preglaucoma, unspecified, bilateral: Secondary | ICD-10-CM | POA: Diagnosis not present

## 2016-06-06 DIAGNOSIS — H40023 Open angle with borderline findings, high risk, bilateral: Secondary | ICD-10-CM | POA: Diagnosis not present

## 2016-06-06 DIAGNOSIS — H2513 Age-related nuclear cataract, bilateral: Secondary | ICD-10-CM | POA: Diagnosis not present

## 2016-06-06 DIAGNOSIS — H52203 Unspecified astigmatism, bilateral: Secondary | ICD-10-CM | POA: Diagnosis not present

## 2016-06-10 ENCOUNTER — Other Ambulatory Visit: Payer: Self-pay | Admitting: Internal Medicine

## 2016-06-11 ENCOUNTER — Other Ambulatory Visit: Payer: Self-pay

## 2016-06-11 MED ORDER — PRAVASTATIN SODIUM 40 MG PO TABS: 40.0000 mg | ORAL_TABLET | Freq: Every day | ORAL | Status: DC

## 2016-06-20 ENCOUNTER — Telehealth: Payer: Self-pay

## 2016-06-20 NOTE — Telephone Encounter (Signed)
Returned pt call re: new lump.  Pt reports new lump below former surgical site.  Not painful, movable, soft/jelly like, approx 1/2 the size of an egg, round.  Pt first noticed the lump yesterday, she does not know how long she has had it.  Pt would like to be seen as soon as possible.  Appt made.  Pt voiced understanding.

## 2016-06-22 ENCOUNTER — Telehealth: Payer: Self-pay | Admitting: Hematology and Oncology

## 2016-06-22 ENCOUNTER — Encounter: Payer: Self-pay | Admitting: Hematology and Oncology

## 2016-06-22 ENCOUNTER — Ambulatory Visit (HOSPITAL_BASED_OUTPATIENT_CLINIC_OR_DEPARTMENT_OTHER): Payer: Medicare Other | Admitting: Hematology and Oncology

## 2016-06-22 DIAGNOSIS — Z170 Estrogen receptor positive status [ER+]: Secondary | ICD-10-CM | POA: Diagnosis not present

## 2016-06-22 DIAGNOSIS — Z79811 Long term (current) use of aromatase inhibitors: Secondary | ICD-10-CM | POA: Diagnosis not present

## 2016-06-22 DIAGNOSIS — C50412 Malignant neoplasm of upper-outer quadrant of left female breast: Secondary | ICD-10-CM

## 2016-06-22 NOTE — Progress Notes (Signed)
Patient Care Team: Colon Branch, MD as PCP - General [name], MD as Consulting Physician (Hematology and Oncology) [name], MD as Consulting Physician (General Surgery) Marylynn Pearson, MD as Consulting Physician (Obstetrics and Gynecology) Irene Shipper, MD as Consulting Physician (Gastroenterology) Gery Pray, MD as Consulting Physician (Radiation Oncology)  DIAGNOSIS: No matching staging information was found for the patient.  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 She is tolerating aspirin extremely well. She does not have any hot flashes or myalgias. She had a right thyroidectomy in March 2016. She felt a prominence in the right sternoclavicular junction and wanted to be checked out about it. She does not have any pain or discomfort. There is no inflammation. There is no tenderness to deep palpation. It appears to be a prominence of the joint space between the sternum and the clavicle. She denies any lumps or nodules in the breasts.  REVIEW OF SYSTEMS:   Constitutional: Denies fevers, chills or abnormal weight loss Eyes: Denies blurriness of vision Ears, nose, mouth, throat, and face: Denies mucositis or sore throat Respiratory: Denies cough, dyspnea or wheezes Cardiovascular: Denies  palpitation, chest discomfort Gastrointestinal:  Denies nausea, heartburn or change in bowel habits Skin: Denies abnormal skin rashes Lymphatics: Denies new lymphadenopathy or easy bruising Neurological:Denies numbness, tingling or new weaknesses Behavioral/Psych: Mood is stable, no new changes  Extremities: No lower extremity edema Breast: Patient has noticed some dryness and crusting of the skin on the left breast. She has used Vaseline and has resolved. All other systems were reviewed with the patient and are negative.  I have reviewed the past medical history, past surgical history, social history and family history with the patient and they are unchanged from previous note.  ALLERGIES:  is allergic to codeine; hyaluronic acid  [collagen-chond-hyaluronic acid]; carvedilol; felodipine; lisinopril; losartan potassium; and radiaplexrx [pyridoxine-zinc picolinate].  MEDICATIONS:  Current Outpatient Prescriptions  Medication Sig Dispense Refill  . anastrozole (ARIMIDEX) 1 MG tablet TAKE 1 TABLET BY MOUTH EVERY DAY 90 tablet 3  . aspirin 81 MG EC tablet Take 81 mg by mouth every morning.     Marland Kitchen azelastine (ASTELIN) 0.1 % nasal spray Place 2 sprays into both nostrils at bedtime as needed for rhinitis. Use in each nostril as directed (Patient not taking: Reported on 02/21/2016) 30 mL 3  . calcium carbonate 1250 MG capsule Take 2,500 mg by mouth every morning.     . cholecalciferol (VITAMIN D) 1000 UNITS tablet Take 2,500 Units by mouth every morning.     . dorzolamide-timolol (COSOPT) 22.3-6.8 MG/ML ophthalmic solution Place 1 drop into both eyes every morning.     . fluticasone (CUTIVATE) 0.005 % ointment Apply 1 application topically 3 (three) times daily as needed. 60 g 3  .  latanoprost (XALATAN) 0.005 % ophthalmic solution Place 1 drop into both eyes at bedtime.    Marland Kitchen levothyroxine (SYNTHROID, LEVOTHROID) 25 MCG tablet Take 1 tablet (25 mcg total) by mouth daily before breakfast. 90 tablet 1    . Multiple Vitamin (MULTIVITAMIN) tablet Take 1 tablet by mouth every morning.     . olmesartan (BENICAR) 40 MG tablet Take 1 tablet (40 mg total) by mouth daily. 90 tablet 2  . pravastatin (PRAVACHOL) 40 MG tablet Take 1 tablet (40 mg total) by mouth daily. 90 tablet 0   No current facility-administered medications for this visit.     PHYSICAL EXAMINATION: ECOG PERFORMANCE STATUS: 1 - Symptomatic but completely ambulatory  There were no vitals filed for this visit. There were no vitals filed for this visit.  GENERAL:alert, no distress and comfortable SKIN: skin color, texture, turgor are normal, no rashes or significant lesions EYES: normal, Conjunctiva are pink and non-injected, sclera clear OROPHARYNX:no exudate, no erythema and lips, buccal mucosa, and tongue normal  NECK: supple, thyroid normal size, non-tender, without nodularity LYMPH:  no palpable lymphadenopathy in the cervical, axillary or inguinal LUNGS: clear to auscultation and percussion with normal breathing effort HEART: regular rate & rhythm and no murmurs and no lower extremity edema ABDOMEN:abdomen soft, non-tender and normal bowel sounds MUSCULOSKELETAL:no cyanosis of digits and no clubbing  NEURO: alert & oriented x 3 with fluent speech, no focal motor/sensory deficits EXTREMITIES: No lower extremity edema BREAST: There is prominence of the sternoclavicular junction but otherwise no palpable abnormalities of concern. No palpable masses or nodules in either right or left breasts. No palpable axillary supraclavicular or infraclavicular adenopathy no breast tenderness or nipple discharge. (exam performed in the presence of a chaperone)  LABORATORY DATA:  I have reviewed the data as listed   Chemistry      Component Value Date/Time   NA 140 11/10/2015 1129   K 4.0 11/10/2015 1129   CL 103 10/14/2015 1543   CO2 23 11/10/2015 1129   BUN 17.0 11/10/2015 1129   CREATININE 0.8 11/10/2015 1129      Component Value  Date/Time   CALCIUM 9.2 11/10/2015 1129   ALKPHOS 100 11/10/2015 1129   AST 15 11/10/2015 1129   ALT 14 11/10/2015 1129   BILITOT 0.47 11/10/2015 1129       Lab Results  Component Value Date   WBC 4.4 01/30/2016   HGB 11.3 (L) 01/30/2016   HCT 33.2 (L) 01/30/2016   MCV 91.0 01/30/2016   PLT 186.0 01/30/2016   NEUTROABS 3.0 01/30/2016     ASSESSMENT & PLAN:  Breast cancer of upper-outer quadrant of left female breast Left breast lumpectomy 09/14/2014: 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 N0 M0 Oncotype DX recurrence score 15, 9% ROR Currently on anastrozole 1 mg daily started 01/25/2015  Prominence of sternoclavicular junction: I do not feel any abnormality of significance. I discussed with her that the prominence does not indicate any pathogenesis. We can continue to watch monitor this without any scans. In March she had a CT scan which did not show any problems there.  Anastrozole toxicities: Patient denies any problems or concerns taking Anastrozole. She denies any hot flashes or myalgias. Breast cancer surveillance: 1. Breast exam 06/22/2016 is normal, Dryness of the skin around the left nipple improved with Vaseline. 2. Mammograms 10/14/2015: Benign  Right flank pain: resolved with a spine injection.  Return  to clinic in 6 months for follow up   No orders of the defined types were placed in this encounter.  The patient has a good understanding of the overall plan. she agrees with it. she will call with any problems that may develop before the next visit here.   Gudena, Vinay K, MD 06/22/16    

## 2016-06-22 NOTE — Telephone Encounter (Signed)
appt made and avs printed °

## 2016-06-22 NOTE — Assessment & Plan Note (Signed)
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 N0 M0 Oncotype DX recurrence score 15, 9% ROR Currently on anastrozole 1 mg daily started 01/25/2015  Anastrozole toxicities: Patient denies any problems or concerns taking an acid. She denies any hot flashes or myalgias. Breast cancer surveillance: 1. Breast exam 06/22/2016 is normal 2. Mammograms 10/14/2015: Benign  Right flank pain: resolved with a spine injection.  Return to clinic in 1 year for follow up

## 2016-06-30 IMAGING — MG MM DIGITAL SCREENING BILAT W/ CAD
4 series · 4 of 4 positions shown · non-contrast
Comparison: Previous exam(s)

CLINICAL DATA: Screening.

EXAM:
DIGITAL SCREENING BILATERAL MAMMOGRAM WITH CAD

[R CC]
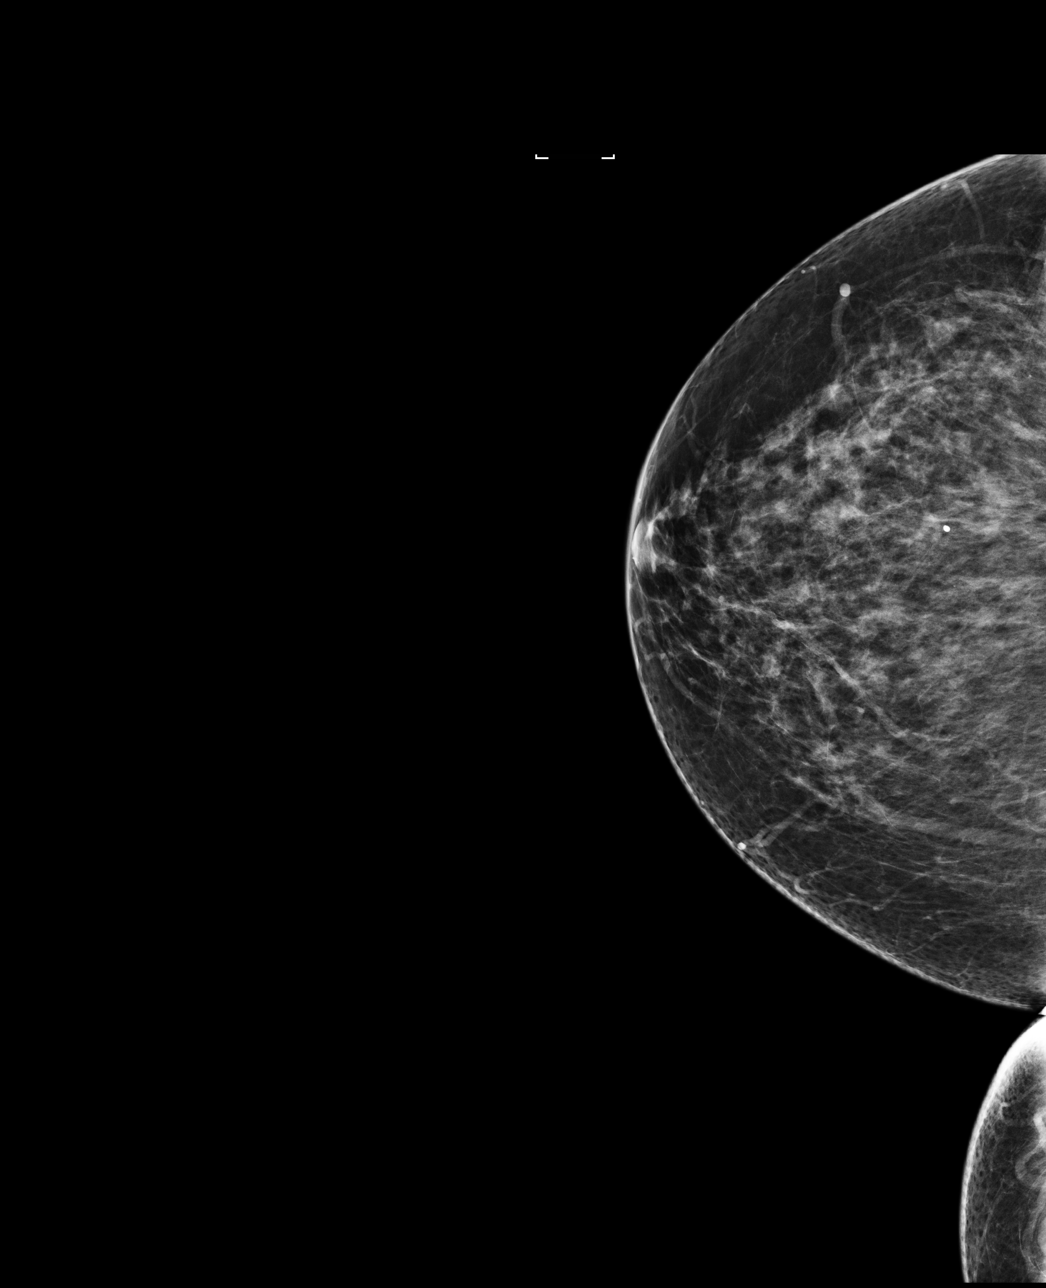

[R MLO]
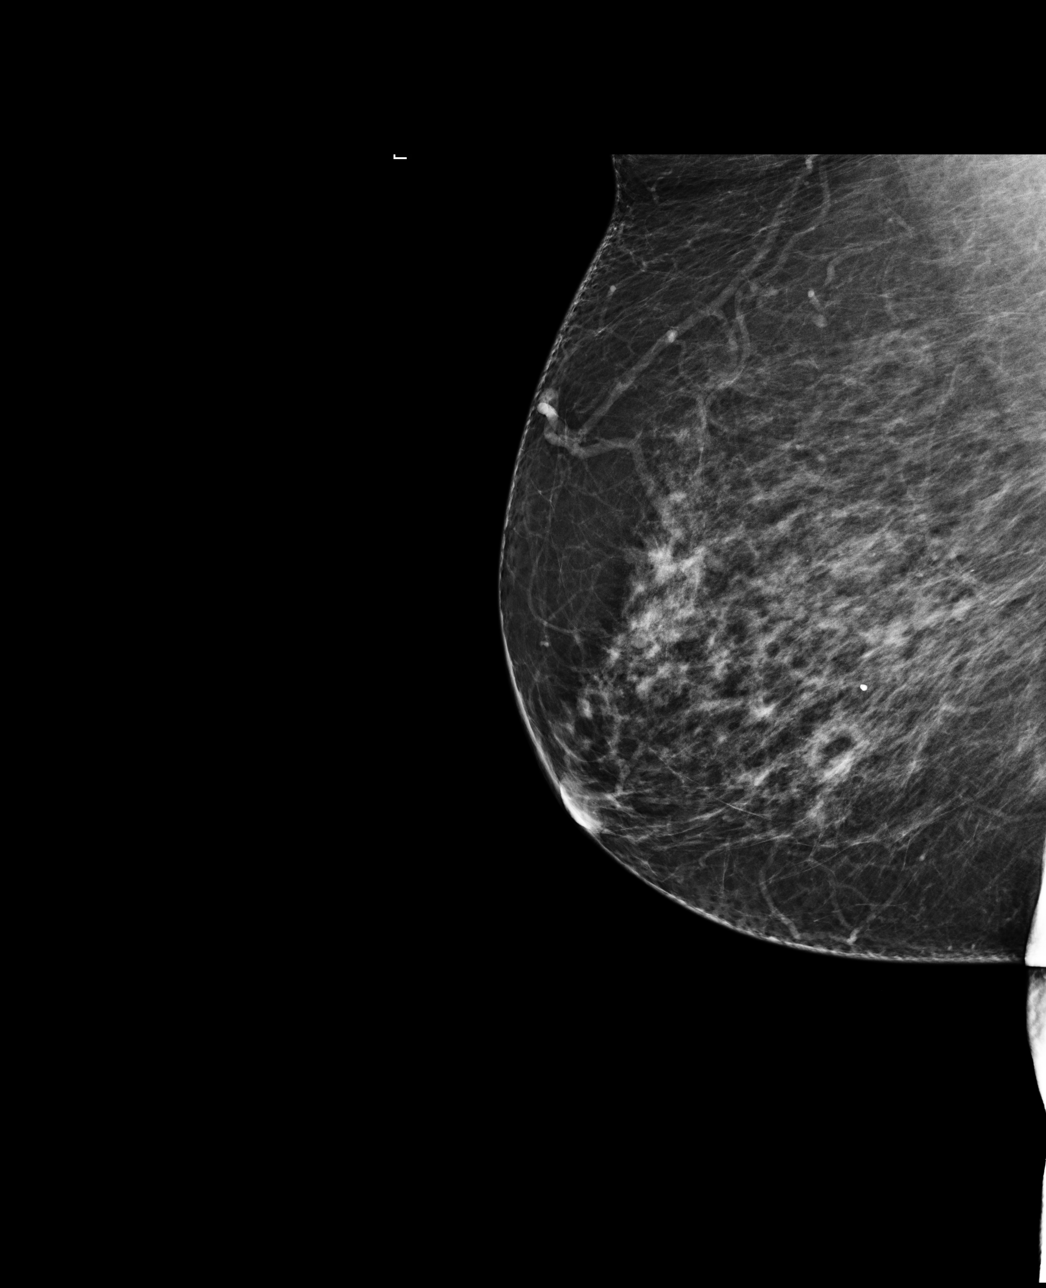

[L MLO]
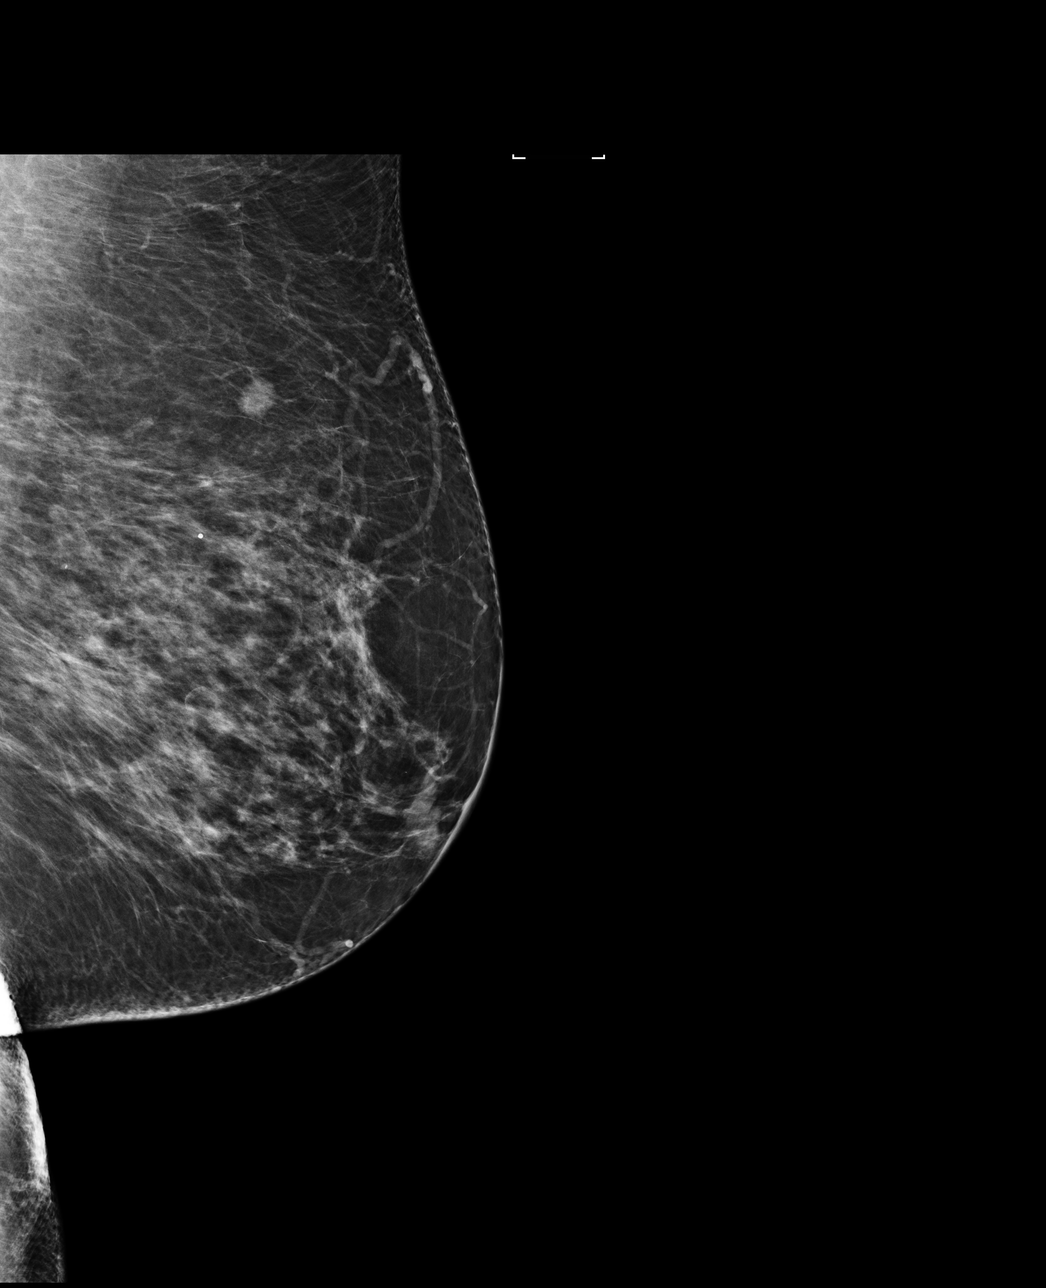

[L CC]
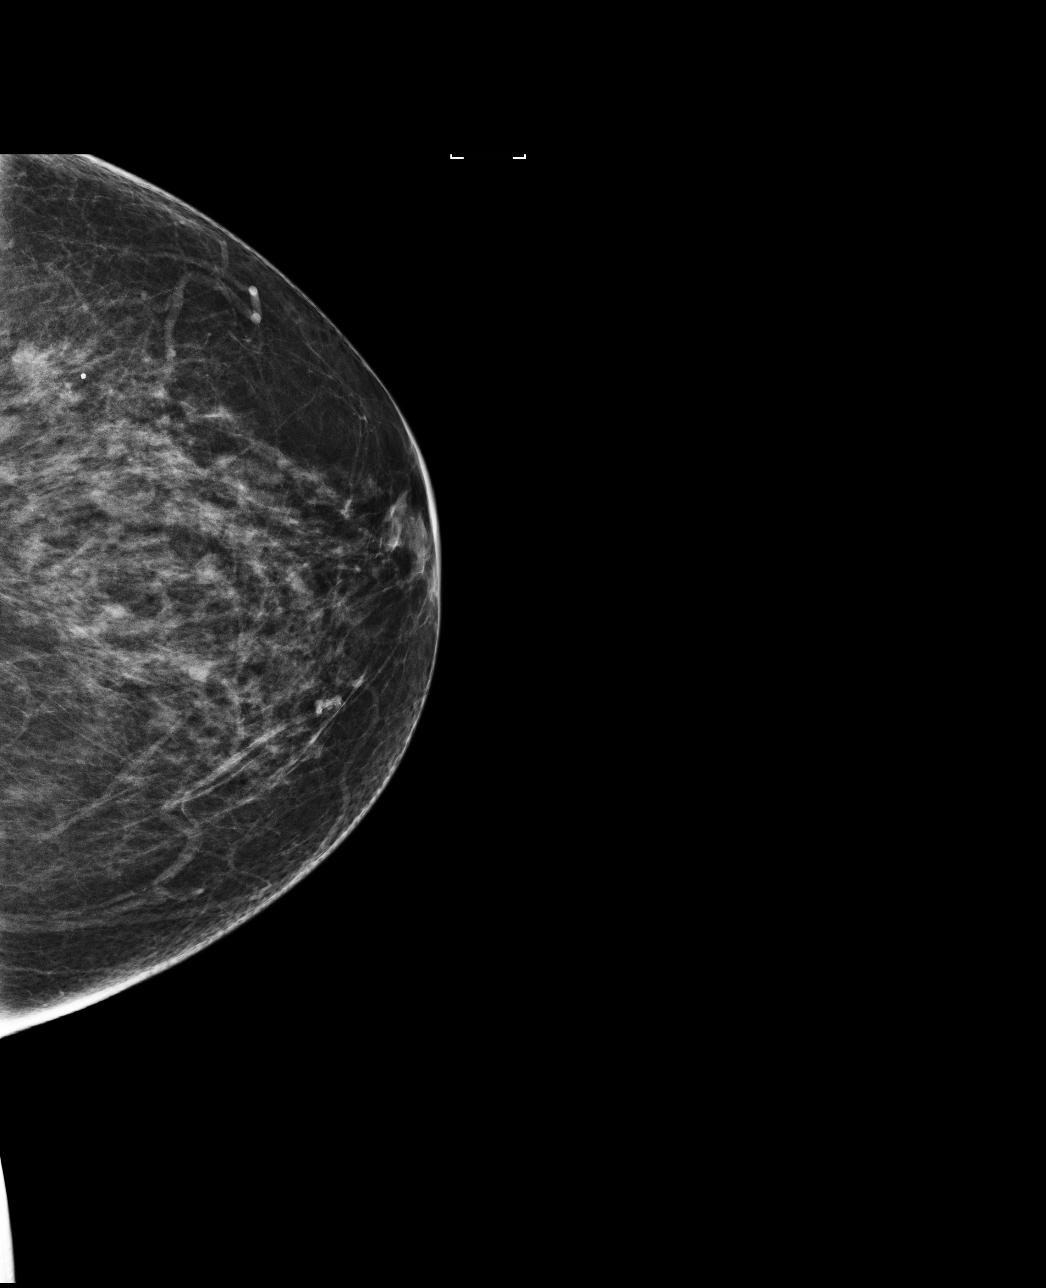

[4 of 4 positions shown; findings below may reference images not displayed]

ACR Breast Density Category c: The breast tissue is heterogeneously
dense, which may obscure small masses.
FINDINGS: In the left breast, a possible mass warrants further evaluation with
spot compression views and possibly ultrasound. In the right breast,
no findings suspicious for malignancy.

Images were processed with CAD.
IMPRESSION: Further evaluation is suggested for possible mass in the left
breast.

RECOMMENDATION:
Diagnostic mammogram and possibly ultrasound of the left breast.
(Code:[id number]-I-BBB)

The patient will be contacted regarding the findings, and additional
imaging will be scheduled.

BI-RADS CATEGORY  0: Incomplete. Need additional imaging evaluation
and/or prior mammograms for comparison.

## 2016-07-09 ENCOUNTER — Encounter: Payer: Self-pay | Admitting: Internal Medicine

## 2016-07-10 ENCOUNTER — Encounter: Payer: Self-pay | Admitting: Internal Medicine

## 2016-07-10 ENCOUNTER — Telehealth: Payer: Self-pay | Admitting: Internal Medicine

## 2016-07-10 ENCOUNTER — Ambulatory Visit (INDEPENDENT_AMBULATORY_CARE_PROVIDER_SITE_OTHER): Payer: Medicare Other | Admitting: Internal Medicine

## 2016-07-10 ENCOUNTER — Ambulatory Visit: Payer: Medicare Other | Admitting: Internal Medicine

## 2016-07-10 VITALS — BP 126/68 | HR 57 | Temp 97.8°F | Resp 12 | Ht 63.0 in | Wt 219.2 lb

## 2016-07-10 DIAGNOSIS — E785 Hyperlipidemia, unspecified: Secondary | ICD-10-CM

## 2016-07-10 DIAGNOSIS — I10 Essential (primary) hypertension: Secondary | ICD-10-CM

## 2016-07-10 DIAGNOSIS — E039 Hypothyroidism, unspecified: Secondary | ICD-10-CM

## 2016-07-10 LAB — BASIC METABOLIC PANEL
BUN: 14 mg/dL (ref 6–23)
CHLORIDE: 105 meq/L (ref 96–112)
CO2: 27 meq/L (ref 19–32)
CREATININE: 0.86 mg/dL (ref 0.40–1.20)
Calcium: 9.7 mg/dL (ref 8.4–10.5)
GFR: 68.34 mL/min (ref >60.00)
Glucose, Bld: 104 mg/dL — ABNORMAL HIGH (ref 70–99)
POTASSIUM: 4.4 meq/L (ref 3.5–5.1)
Sodium: 141 mEq/L (ref 135–145)

## 2016-07-10 LAB — LIPID PANEL
CHOLESTEROL: 160 mg/dL (ref 0–200)
HDL: 57.7 mg/dL (ref >39.00)
LDL Cholesterol: 80 mg/dL (ref 0–99)
NonHDL: 102.44
Total CHOL/HDL Ratio: 3
Triglycerides: 114 mg/dL (ref 0.0–149.0)
VLDL: 22.8 mg/dL (ref 0.0–40.0)

## 2016-07-10 LAB — TSH: TSH: 4.31 u[IU]/mL (ref 0.35–4.50)

## 2016-07-10 NOTE — Patient Instructions (Signed)
GO TO THE LAB : Get the blood work     GO TO THE FRONT DESK Schedule your next appointment for a yearly checkup in 4-5 months

## 2016-07-10 NOTE — Telephone Encounter (Signed)
No Charge since she is coming in at later time.

## 2016-07-10 NOTE — Progress Notes (Signed)
Subjective:    Patient ID: Lori Joseph, female    DOB: Nov 24, 1941, 75 y.o.   MRN: [id number]  DOS:  07/10/2016 Type of visit - description : rov Interval history: HTN: Good compliance with meds, no ambulatory BPs, BP is at the office satisfactory. High cholesterol: On Pravachol, due for labs Thyroid disease: On Synthroid, due for labs History of abnormal CT chest, chest x-ray: Chart reviewed.  BP Readings from Last 3 Encounters:  07/10/16 126/68  06/22/16 (!) 137/59  02/21/16 126/74     Review of Systems  At this point denies chest pain, difficulty breathing. No nausea or vomiting. No anxiety or depression.   Past Medical History:  Diagnosis Date  . Allergic rhinitis   . Cancer [facility])    breast -left  . Family history of anesthesia complication    daughter hard to wake up  . H/O acute pancreatitis   . H/O retinal detachment   . Hurthle cell neoplasm of thyroid    Right   . HYPERLIPIDEMIA   . Hypertension   . Hypothyroidism 05/31/2015  . OSTEOPENIA   . Primary cancer of upper outer quadrant of left female breast ([place])    Dr. Lindi Adie  . Radiation 11/11/15-12/30/14   left breast 50.4 gray, lumpectomy cavity boosted to 62.4 gray    Past Surgical History:  Procedure Laterality Date  . BREAST BIOPSY Left 08/27/14  . CHOLECYSTECTOMY  2000  . DILATION AND CURETTAGE OF UTERUS    . laser eye surgery, detached retina Left   . RADIOACTIVE SEED GUIDED MASTECTOMY WITH AXILLARY SENTINEL LYMPH NODE BIOPSY Left 09/16/2014   Procedure: RADIOACTIVE SEED GUIDED PARTIAL MASTECTOMY WITH AXILLARY SENTINEL LYMPH NODE BIOPSY;  Surgeon: [name], MD;  Location: [facility];  Service: General;  Laterality: Left;  . THYROID LOBECTOMY N/A 02/10/2015   Procedure: RIGHT THYROID LOBECTOMY;  Surgeon: [name], MD;  Location: [facility];  Service: General;  Laterality: N/A;  . TONSILLECTOMY    . TUBAL LIGATION      Social History   Social History  . Marital status: Married    Spouse name: Ruthann Cancer  . Number of children: 2  . Years of education: N/A   Occupational History  . 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 Smoking status: Never Smoker  . Smokeless tobacco: Never Used  . Alcohol use No     Comment: rarely wine  . Drug use: No  . Sexual activity: Not on file   Other Topics Concern  . Not on file   Social History Narrative   Lives w/ husband        Medication List       Accurate as of 07/10/16  1:15 PM. Always use your most recent med list.          anastrozole 1 MG tablet Commonly known as:  ARIMIDEX TAKE 1 TABLET BY MOUTH EVERY DAY   aspirin 81 MG EC tablet Take 81 mg by mouth every morning.   azelastine 0.1 % nasal spray Commonly known as:  ASTELIN Place 2 sprays into both nostrils at bedtime as needed for rhinitis. Use in each nostril as directed   calcium carbonate 1250 MG capsule Take 2,500 mg by mouth every morning.   cholecalciferol 1000 units tablet Commonly known as:  VITAMIN D Take 2,500 Units by mouth every morning.   dorzolamide-timolol 22.3-6.8 MG/ML ophthalmic solution Commonly known as:  COSOPT Place 1 drop into both  eyes every morning.   fluticasone 0.005 % ointment Commonly known as:  CUTIVATE Apply 1 application topically 3 (three) times daily as needed.   latanoprost 0.005 % ophthalmic solution Commonly known as:  XALATAN Place 1 drop into both eyes at bedtime.   levothyroxine 25 MCG tablet Commonly known as:  SYNTHROID, LEVOTHROID Take 1 tablet (25 mcg total) by mouth daily before breakfast.   multivitamin tablet Take 1 tablet by mouth every morning.   olmesartan 40 MG tablet Commonly known as:  BENICAR Take 1 tablet (40 mg total) by mouth daily.   pravastatin 40 MG tablet Commonly known as:  PRAVACHOL Take 1 tablet (40 mg total) by mouth daily.          Objective:   Physical Exam BP 126/68 (BP Location: Right Arm, Patient Position: Sitting,  Cuff Size: Normal)   Pulse (!) 57   Temp 97.8 F (36.6 C) (Oral)   Resp 12   Ht 5\' 3"  (1.6 m)   Wt 219 lb 4 oz (99.5 kg)   SpO2 97%   BMI 38.84 kg/m  General:   Well developed, well nourished . NAD.  HEENT:  Normocephalic . Face symmetric, atraumatic Lungs:  CTA B Normal respiratory effort, no intercostal retractions, no accessory muscle use. Heart: RRR,  no murmur.  no pretibial edema bilaterally  Abdomen:  Not distended, soft, non-tender. No rebound or rigidity.  Skin: Not pale. Not jaundice Neurologic:  alert & oriented X3.  Speech normal, gait appropriate for age and unassisted Psych--  Cognition and judgment appear intact.  Cooperative with normal attention span and concentration.  Behavior appropriate. No anxious or depressed appearing.    Assessment & Plan:   Assessment  HTN Hyperlipidemia Hypothyroidism Osteopenia: T score 2010 normal, T score 2016 (1.2), T score 05-2012 (-1.2), on calcium and vitamin D Oncology: --Breast cancer, left, dx  2015, XRT 12/2-16 to 12/2014, on Arimidex --Hurthle Cell neoplasm of the thyroid, thyroidectomy 01-2015 ANEMIA- saw hematology, rx IV iron 10-2015 Chronic right-sided chest pain x-rays CT abdomen and pelvis 2016 negative, saw pain mngmt, ortho ---> better with Lidoderm patch and a local injection in the back Glaucoma   H/o acute pancreatitis H/o vertebral detachment Pulmonary: Abnormal chest x-ray and CT chest: Last CT chest 01-2016: See report. CXR 03/27/2016: No acute. Pulmonary: f/u prn ++ FH Lung Ca (pt is not a smoker)  PLAN: HTN: Continue Benicar, check a BMP. Hyperlipidemia: On Pravachol, recent LFTs normal, check a FLP Hypothyroidism: Continue Synthroid, check a TSH. Breast cancer: Last visit with oncology 05-2016, she was stable. Abnormal chest x-ray and CT chest:  chart is reviewed, Last CT chest 01-2016: See report. CXR 03/27/2016: No acute. Pulmonary recommended f/u prn RTC, CPX 4-5 months

## 2016-07-10 NOTE — Progress Notes (Signed)
Pre visit review using our clinic review tool, if applicable. No additional management support is needed unless otherwise documented below in the visit note. 

## 2016-07-10 NOTE — Assessment & Plan Note (Signed)
HTN: Continue Benicar, check a BMP. Hyperlipidemia: On Pravachol, recent LFTs normal, check a FLP Hypothyroidism: Continue Synthroid, check a TSH. Breast cancer: Last visit with oncology 05-2016, she was stable. Abnormal chest x-ray and CT chest:  chart is reviewed, Last CT chest 01-2016: See report. CXR 03/27/2016: No acute. Pulmonary recommended f/u prn RTC, CPX 4-5 months

## 2016-07-10 NOTE — Telephone Encounter (Signed)
Pt called in at 8:30 to make office aware that she made the wrong turn and now will be late to her appt. Informed pt of office 10 minute grace period. Scheduled pt to come in at 9:30 instead so that pcp will remain on schedule and pt can also still be seen. (OK'd w/ CMA)    Should pt be charged for missed time appt?

## 2016-08-29 ENCOUNTER — Encounter: Payer: Self-pay | Admitting: Internal Medicine

## 2016-08-29 ENCOUNTER — Ambulatory Visit: Payer: Medicare Other | Admitting: *Deleted

## 2016-08-29 VITALS — Ht 63.0 in | Wt 217.6 lb

## 2016-08-29 DIAGNOSIS — Z86010 Personal history of colonic polyps: Secondary | ICD-10-CM

## 2016-08-29 MED ORDER — SUPREP BOWEL PREP KIT 17.5-3.13-1.6 GM/177ML PO SOLN: 1.0000 | Freq: Once | ORAL | 0 refills | Status: AC

## 2016-08-29 NOTE — Progress Notes (Signed)
Patient denies any allergies to egg or soy products. Patient denies complications with anesthesia/sedation.  Patient denies oxygen use at home and denies diet medications. Emmi instructions for colonoscopy  explained but patient refused.

## 2016-09-11 ENCOUNTER — Ambulatory Visit: Payer: Medicare Other | Admitting: Hematology and Oncology

## 2016-09-12 ENCOUNTER — Ambulatory Visit (AMBULATORY_SURGERY_CENTER): Payer: Medicare Other | Admitting: Internal Medicine

## 2016-09-12 ENCOUNTER — Encounter: Payer: Self-pay | Admitting: Internal Medicine

## 2016-09-12 VITALS — BP 128/50 | HR 45 | Temp 96.9°F | Resp 13 | Ht 63.0 in | Wt 217.0 lb

## 2016-09-12 DIAGNOSIS — D123 Benign neoplasm of transverse colon: Secondary | ICD-10-CM | POA: Diagnosis not present

## 2016-09-12 DIAGNOSIS — Z86010 Personal history of colonic polyps: Secondary | ICD-10-CM

## 2016-09-12 DIAGNOSIS — Z1211 Encounter for screening for malignant neoplasm of colon: Secondary | ICD-10-CM | POA: Diagnosis not present

## 2016-09-12 MED ORDER — SODIUM CHLORIDE 0.9 % IV SOLN: 500.0000 mL | INTRAVENOUS | Status: DC

## 2016-09-12 NOTE — Progress Notes (Signed)
A and O x3. Report to RN. Tolerated MAC anesthesia well. 

## 2016-09-12 NOTE — Progress Notes (Signed)
No problems noted in the recovery room. maw 

## 2016-09-12 NOTE — Progress Notes (Signed)
Called to room to assist during endoscopic procedure.  Patient ID and intended procedure confirmed with present staff. Received instructions for my participation in the procedure from the performing physician.  

## 2016-09-12 NOTE — Op Note (Signed)
[place] Patient Name: Lori Joseph Procedure Date: 09/12/2016 11:10 AM MRN: [id number] Endoscopist: Docia Chuck. Henrene Pastor , MD Age: 75 Referring MD:  Date of Birth: December 10, 1940 Gender: Female Account #: [id number] Procedure:                Colonoscopy, with cold snare polypectomy x 1 Indications:              Surveillance: Personal history of adenomatous                            polyps on last colonoscopy > 5 years ago, High risk                            colon cancer surveillance: Personal history of                            non-advanced adenoma Medicines:                Monitored Anesthesia Care Procedure:                Pre-Anesthesia Assessment:                           - Prior to the procedure, a History and Physical                            was performed, and patient medications and                            allergies were reviewed. The patient's tolerance of                            previous anesthesia was also reviewed. The risks                            and benefits of the procedure and the sedation                            options and risks were discussed with the patient.                            All questions were answered, and informed consent                            was obtained. Prior Anticoagulants: The patient has                            taken no previous anticoagulant or antiplatelet                            agents. ASA Grade Assessment: II - A patient with                            mild systemic disease. After reviewing the risks  and benefits, the patient was deemed in                            satisfactory condition to undergo the procedure.                           After obtaining informed consent, the colonoscope                            was passed under direct vision. Throughout the                            procedure, the patient's blood pressure, pulse, and                            oxygen  saturations were monitored continuously. The                            Model CF-HQ190L [id number]) scope was introduced                            through the anus and advanced to the the cecum,                            identified by appendiceal orifice and ileocecal                            valve. The ileocecal valve, appendiceal orifice,                            and rectum were photographed. The quality of the                            bowel preparation was excellent. The colonoscopy                            was performed without difficulty. The patient                            tolerated the procedure well. The bowel preparation                            used was SUPREP. Scope In: 11:22:38 AM Scope Out: 11:34:30 AM Scope Withdrawal Time: 0 hours 10 minutes 2 seconds  Total Procedure Duration: 0 hours 11 minutes 52 seconds  Findings:                 A 1 mm polyp was found in the transverse colon. The                            polyp was removed with a cold snare. Resection and                            retrieval were complete.  Multiple diverticula were found in the left colon                            and right colon.                           Internal hemorrhoids were found during                            retroflexion. The hemorrhoids were small.                           The exam was otherwise without abnormality on                            direct and retroflexion views. Complications:            No immediate complications. Estimated blood loss:                            None. Estimated Blood Loss:     Estimated blood loss: none. Impression:               - One 1 mm polyp in the transverse colon, removed                            with a cold snare. Resected and retrieved.                           - Diverticulosis in the left colon and in the right                            colon.                           - Internal hemorrhoids.                            - The examination was otherwise normal on direct                            and retroflexion views. Recommendation:           - Repeat colonoscopy is not recommended for                            surveillance, given your current age and favorable                            findings on today's exam.                           - Patient has a contact number available for                            emergencies. The signs and symptoms of potential  delayed complications were discussed with the                            patient. Return to normal activities tomorrow.                            Written discharge instructions were provided to the                            patient.                           - Resume previous diet.                           - Continue present medications.                           - Await pathology results. Docia Chuck. Henrene Pastor, MD 09/12/2016 11:39:14 AM This report has been signed electronically.

## 2016-09-12 NOTE — Patient Instructions (Addendum)
YOU HAD AN ENDOSCOPIC PROCEDURE TODAY AT [facility]:   Refer to the procedure report that was given to you for any specific questions about what was found during the examination.  If the procedure report does not answer your questions, please call your gastroenterologist to clarify.  If you requested that your care partner not be given the details of your procedure findings, then the procedure report has been included in a sealed envelope for you to review at your convenience later.  YOU SHOULD EXPECT: Some feelings of bloating in the abdomen. Passage of more gas than usual.  Walking can help get rid of the air that was put into your GI tract during the procedure and reduce the bloating. If you had a lower endoscopy (such as a colonoscopy or flexible sigmoidoscopy) you may notice spotting of blood in your stool or on the toilet paper. If you underwent a bowel prep for your procedure, you may not have a normal bowel movement for a few days.  Please Note:  You might notice some irritation and congestion in your nose or some drainage.  This is from the oxygen used during your procedure.  There is no need for concern and it should clear up in a day or so.  SYMPTOMS TO REPORT IMMEDIATELY:   Following lower endoscopy (colonoscopy or flexible sigmoidoscopy):  Excessive amounts of blood in the stool  Significant tenderness or worsening of abdominal pains  Swelling of the abdomen that is new, acute  Fever of 100F or higher   Following upper endoscopy (EGD)  Vomiting of blood or coffee ground material  New chest pain or pain under the shoulder blades  Painful or persistently difficult swallowing  New shortness of breath  Fever of 100F or higher  Black, tarry-looking stools  For urgent or emergent issues, a gastroenterologist can be reached at any hour by calling [phone].   DIET:  We do recommend a small meal at first, but then you may proceed to your regular diet.  Drink  plenty of fluids but you should avoid alcoholic beverages for 24 hours.  ACTIVITY:  You should plan to take it easy for the rest of today and you should NOT DRIVE or use heavy machinery until tomorrow (because of the sedation medicines used during the test).    FOLLOW UP: Our staff will call the number listed on your records the next business day following your procedure to check on you and address any questions or concerns that you may have regarding the information given to you following your procedure. If we do not reach you, we will leave a message.  However, if you are feeling well and you are not experiencing any problems, there is no need to return our call.  We will assume that you have returned to your regular daily activities without incident.  If any biopsies were taken you will be contacted by phone or by letter within the next 1-3 weeks.  Please call us at [phone] if you have not heard about the biopsies in 3 weeks.    SIGNATURES/CONFIDENTIALITY: You and/or your care partner have signed paperwork which will be entered into your electronic medical record.  These signatures attest to the fact that that the information above on your After Visit Summary has been reviewed and is understood.  Full responsibility of the confidentiality of this discharge information lies with you and/or your care-partner.    Handouts were given to your care partner on polyps,  hemorrhoids, and a high fiber diet with liberal fluid intake. You may resume your current medications today. Await biopsy results. Please call if any questions or concerns.   

## 2016-09-13 ENCOUNTER — Telehealth: Payer: Self-pay

## 2016-09-13 NOTE — Telephone Encounter (Signed)
  Follow up Call-  Call back number 09/12/2016  Post procedure Call Back phone  # [phone]  Permission to leave phone message Yes  Some recent data might be hidden     Patient questions:  Do you have a fever, pain , or abdominal swelling? No. Pain Score  0 *  Have you tolerated food without any problems? Yes.    Have you been able to return to your normal activities? Yes.    Do you have any questions about your discharge instructions: Diet   No. Medications  No. Follow up visit  No.  Do you have questions or concerns about your Care? No.  Actions: * If pain score is 4 or above: No action needed, pain <4.

## 2016-09-14 ENCOUNTER — Ambulatory Visit (INDEPENDENT_AMBULATORY_CARE_PROVIDER_SITE_OTHER): Payer: Medicare Other | Admitting: Internal Medicine

## 2016-09-14 ENCOUNTER — Encounter: Payer: Self-pay | Admitting: Internal Medicine

## 2016-09-14 VITALS — BP 126/74 | HR 58 | Temp 97.8°F | Resp 14 | Ht 63.0 in | Wt 214.4 lb

## 2016-09-14 DIAGNOSIS — Z85828 Personal history of other malignant neoplasm of skin: Secondary | ICD-10-CM

## 2016-09-14 DIAGNOSIS — Z0000 Encounter for general adult medical examination without abnormal findings: Secondary | ICD-10-CM | POA: Diagnosis not present

## 2016-09-14 DIAGNOSIS — Z808 Family history of malignant neoplasm of other organs or systems: Secondary | ICD-10-CM

## 2016-09-14 DIAGNOSIS — Z23 Encounter for immunization: Secondary | ICD-10-CM | POA: Diagnosis not present

## 2016-09-14 NOTE — Patient Instructions (Addendum)
Continue taking the same medications  Check the  blood pressure 2 or 3 times a month  Be sure your blood pressure is between 110/65 and  145/85.  if it is consistently higher or lower, let me know  Think about the healthcare power of attorney      Fall Prevention and Home Safety Falls cause injuries and can affect all age groups. It is possible to use preventive measures to significantly decrease the likelihood of falls. There are many simple measures which can make your home safer and prevent falls. OUTDOORS  Repair cracks and edges of walkways and driveways.  Remove high doorway thresholds.  Trim shrubbery on the main path into your home.  Have good outside lighting.  Clear walkways of tools, rocks, debris, and clutter.  Check that handrails are not broken and are securely fastened. Both sides of steps should have handrails.  Have leaves, snow, and ice cleared regularly.  Use sand or salt on walkways during winter months.  In the garage, clean up grease or oil spills. BATHROOM  Install night lights.  Install grab bars by the toilet and in the tub and shower.  Use non-skid mats or decals in the tub or shower.  Place a plastic non-slip stool in the shower to sit on, if needed.  Keep floors dry and clean up all water on the floor immediately.  Remove soap buildup in the tub or shower on a regular basis.  Secure bath mats with non-slip, double-sided rug tape.  Remove throw rugs and tripping hazards from the floors. BEDROOMS  Install night lights.  Make sure a bedside light is easy to reach.  Do not use oversized bedding.  Keep a telephone by your bedside.  Have a firm chair with side arms to use for getting dressed.  Remove throw rugs and tripping hazards from the floor. KITCHEN  Keep handles on pots and pans turned toward the center of the stove. Use back burners when possible.  Clean up spills quickly and allow time for drying.  Avoid walking on wet  floors.  Avoid hot utensils and knives.  Position shelves so they are not too high or low.  Place commonly used objects within easy reach.  If necessary, use a sturdy step stool with a grab bar when reaching.  Keep electrical cables out of the way.  Do not use floor polish or wax that makes floors slippery. If you must use wax, use non-skid floor wax.  Remove throw rugs and tripping hazards from the floor. STAIRWAYS  Never leave objects on stairs.  Place handrails on both sides of stairways and use them. Fix any loose handrails. Make sure handrails on both sides of the stairways are as long as the stairs.  Check carpeting to make sure it is firmly attached along stairs. Make repairs to worn or loose carpet promptly.  Avoid placing throw rugs at the top or bottom of stairways, or properly secure the rug with carpet tape to prevent slippage. Get rid of throw rugs, if possible.  Have an electrician put in a light switch at the top and bottom of the stairs. OTHER FALL PREVENTION TIPS  Wear low-heel or rubber-soled shoes that are supportive and fit well. Wear closed toe shoes.  When using a stepladder, make sure it is fully opened and both spreaders are firmly locked. Do not climb a closed stepladder.  Add color or contrast paint or tape to grab bars and handrails in your home. Place contrasting color  strips on first and last steps.  Learn and use mobility aids as needed. Install an electrical emergency response system.  Turn on lights to avoid dark areas. Replace light bulbs that burn out immediately. Get light switches that glow.  Arrange furniture to create clear pathways. Keep furniture in the same place.  Firmly attach carpet with non-skid or double-sided tape.  Eliminate uneven floor surfaces.  Select a carpet pattern that does not visually hide the edge of steps.  Be aware of all pets. OTHER HOME SAFETY TIPS  Set the water temperature for 120 F (48.8 C).  Keep  emergency numbers on or near the telephone.  Keep smoke detectors on every level of the home and near sleeping areas. Document Released: 11/02/2002 Document Revised: 05/13/2012 Document Reviewed: 02/01/2012 [facility] Patient Information 2015 [place], [place]. This information is not intended to replace advice given to you by your health care provider. Make sure you discuss any questions you have with your health care provider.   Preventive Care for Adults Ages 30 and over  Blood pressure check.** / Every 1 to 2 years.  Lipid and cholesterol check.**/ Every 5 years beginning at age 11.  Lung cancer screening. / Every year if you are aged 38-80 years and have a 30-pack-year history of smoking and currently smoke or have quit within the past 15 years. Yearly screening is stopped once you have quit smoking for at least 15 years or develop a health problem that would prevent you from having lung cancer treatment.  Fecal occult blood test (FOBT) of stool. / Every year beginning at age 37 and continuing until age 72. You may not have to do this test if you get a colonoscopy every 10 years.  Flexible sigmoidoscopy** or colonoscopy.** / Every 5 years for a flexible sigmoidoscopy or every 10 years for a colonoscopy beginning at age 83 and continuing until age 76.  Hepatitis C blood test.** / For all people born from [birth year] through 1965 and any individual with known risks for hepatitis C.  Abdominal aortic aneurysm (AAA) screening.** / A one-time screening for ages 39 to 51 years who are current or former smokers.  Skin self-exam. / Monthly.  Influenza vaccine. / Every year.  Tetanus, diphtheria, and acellular pertussis (Tdap/Td) vaccine.** / 1 dose of Td every 10 years.  Varicella vaccine.** / Consult your health care provider.  Zoster vaccine.** / 1 dose for adults aged 55 years or older.  Pneumococcal 13-valent conjugate (PCV13) vaccine.** / Consult your health care provider.  Pneumococcal  polysaccharide (PPSV23) vaccine.** / 1 dose for all adults aged 51 years and older.  Meningococcal vaccine.** / Consult your health care provider.  Hepatitis A vaccine.** / Consult your health care provider.  Hepatitis B vaccine.** / Consult your health care provider.  Haemophilus influenzae type b (Hib) vaccine.** / Consult your health care provider. **Family history and personal history of risk and conditions may change your health care provider's recommendations. Document Released: 01/08/2002 Document Revised: 11/17/2013 Document Reviewed: 04/09/2011 [facility] Patient Information 2015 [place], [place]. This information is not intended to replace advice given to you by your health care provider. Make sure you discuss any questions you have with your health care provider.

## 2016-09-14 NOTE — Assessment & Plan Note (Addendum)
Td 2008; pneumonia shot 2008;prevnar 2015; zostavax 2012 Flu shot  today Female care: per gyn H/o breast ca, last MMG 09-2015  Cscope @ [facility] 01-2008 : 2 polyps ----> tubular adenomas w/  low grade dysplasia Cscope 08/2010, Dr Henrene Pastor, very small polyps Last Cscope 08-2016  Counseled regards fall prevention, healthcare power of attorney. Is  doing great w/ diet, loosing some weight

## 2016-09-14 NOTE — Progress Notes (Signed)
Pre visit review using our clinic review tool, if applicable. No additional management support is needed unless otherwise documented below in the visit note. 

## 2016-09-14 NOTE — Progress Notes (Signed)
Subjective:    Patient ID: Lori Joseph, female    DOB: 08-30-1941, 75 y.o.   MRN: [id number]  DOS:  09/14/2016 Type of visit - description : CPX Interval history: No major concerns HTN: Good med compliance, ambulatory BPs occasionally high but in the office they're always within normal High cholesterol: On statins, no apparent side effects History of breast cancer: Good compliance with Arimidex Thyroid disease: Good compliance with Synthroid History of BCC, and family history of melanoma, has not seen dermatology in 5 years.    Review of Systems Constitutional: No fever. No chills. No unexplained wt changes. No unusual sweats  HEENT: No dental problems, no ear discharge, no facial swelling, no voice changes. No eye discharge, no eye  redness , no  intolerance to light   Respiratory: No wheezing , no  difficulty breathing. No cough , no mucus production  Cardiovascular: No CP, no leg swelling , no  Palpitations  GI: no nausea, no vomiting, no diarrhea , no  abdominal pain.  No blood in the stools. No dysphagia, no odynophagia    Endocrine: No polyphagia, no polyuria , no polydipsia  GU: No dysuria, gross hematuria, difficulty urinating. No urinary urgency, no frequency.  Musculoskeletal: No joint swellings or unusual aches or pains  Skin: No change in the color of the skin, palor , no  Rash  Allergic, immunologic: No environmental allergies , no  food allergies  Neurological: No dizziness no  syncope. No headaches. No diplopia, no slurred, no slurred speech, no motor deficits, no facial  Numbness  Hematological: No enlarged lymph nodes, no easy bruising , no unusual bleedings  Psychiatry: No suicidal ideas, no hallucinations, no beavior problems, no confusion.  No unusual/severe anxiety, no depression   Past Medical History:  Diagnosis Date  . Allergic rhinitis   . Anemia   . Cancer [facility])    breast -left  . Family history of anesthesia complication    daughter  hard to wake up  . Glaucoma   . H/O acute pancreatitis   . H/O retinal detachment   . Hurthle cell neoplasm of thyroid    Right   . HYPERLIPIDEMIA   . Hypertension   . Hypothyroidism 05/31/2015  . OSTEOPENIA   . Primary cancer of upper outer quadrant of left female breast ([place])    Dr. Lindi Adie  . Radiation 11/11/15-12/30/14   left breast 50.4 gray, lumpectomy cavity boosted to 62.4 gray    Past Surgical History:  Procedure Laterality Date  . BREAST BIOPSY Left 08/27/14  . CHOLECYSTECTOMY  2000  . DILATION AND CURETTAGE OF UTERUS    . laser eye surgery, detached retina Left   . RADIOACTIVE SEED GUIDED MASTECTOMY WITH AXILLARY SENTINEL LYMPH NODE BIOPSY Left 09/16/2014   Procedure: RADIOACTIVE SEED GUIDED PARTIAL MASTECTOMY WITH AXILLARY SENTINEL LYMPH NODE BIOPSY;  Surgeon: [name], MD;  Location: [facility];  Service: General;  Laterality: Left;  . thyoidectomy Left    partial  . THYROID LOBECTOMY N/A 02/10/2015   Procedure: RIGHT THYROID LOBECTOMY;  Surgeon: [name], MD;  Location: [facility];  Service: General;  Laterality: N/A;  . TONSILLECTOMY    . TUBAL LIGATION      Social History   Social History  . Marital status: Married    Spouse name: Ruthann Cancer  . Number of children: 2  . Years of education: N/A   Occupational History  . 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 Smoking status: Never Smoker  . Smokeless tobacco: Never Used  . Alcohol use 0.0 oz/week     Comment: rarely wine  . Drug use: No  . Sexual activity: Not on file   Other Topics Concern  . Not on file   Social History Narrative   Lives w/ husband     Family History  Problem Relation Age of Onset  . Breast cancer Sister 42  . Cancer Father     lung cancer ; smoker  . Cancer Brother     3 brothers with lung cancer, all smokers  . Cancer Paternal Uncle     2 pat uncles with lung cancer, smokers  . Kidney cancer Sister 23  .  Melanoma Sister 52  . Ovarian cancer Other 37    niece with ovarian cancer (related through sister with breast cancer)  . Colon cancer Neg Hx   . CAD Neg Hx   . Esophageal cancer Neg Hx   . Stomach cancer Neg Hx   . Rectal cancer Neg Hx        Medication List       Accurate as of 09/14/16  1:56 PM. Always use your most recent med list.          anastrozole 1 MG tablet Commonly known as:  ARIMIDEX TAKE 1 TABLET BY MOUTH EVERY DAY   aspirin 81 MG EC tablet Take 81 mg by mouth every morning.   calcium carbonate 1250 MG capsule Take 2,500 mg by mouth every morning.   cholecalciferol 1000 units tablet Commonly known as:  VITAMIN D Take 2,500 Units by mouth every morning.   dorzolamide-timolol 22.3-6.8 MG/ML ophthalmic solution Commonly known as:  COSOPT Place 1 drop into both eyes every morning.   fluticasone 0.005 % ointment Commonly known as:  CUTIVATE Apply 1 application topically 3 (three) times daily as needed.   latanoprost 0.005 % ophthalmic solution Commonly known as:  XALATAN Place 1 drop into both eyes at bedtime.   levothyroxine 25 MCG tablet Commonly known as:  SYNTHROID, LEVOTHROID Take 1 tablet (25 mcg total) by mouth daily before breakfast.   multivitamin tablet Take 1 tablet by mouth every morning.   olmesartan 40 MG tablet Commonly known as:  BENICAR Take 1 tablet (40 mg total) by mouth daily.   pravastatin 40 MG tablet Commonly known as:  PRAVACHOL Take 1 tablet (40 mg total) by mouth daily.          Objective:   Physical Exam BP 126/74 (BP Location: Right Arm, Patient Position: Sitting, Cuff Size: Normal)   Pulse (!) 58   Temp 97.8 F (36.6 C) (Oral)   Resp 14   Ht 5\' 3"  (1.6 m)   Wt 214 lb 6 oz (97.2 kg)   SpO2 97%   BMI 37.97 kg/m   General:   Well developed, well nourished . NAD.  Neck: No mass or LADs. HEENT:  Normocephalic . Face symmetric, atraumatic Lungs:  CTA B Normal respiratory effort, no intercostal  retractions, no accessory muscle use. Heart: RRR,  no murmur.  No pretibial edema bilaterally  Abdomen:  Not distended, soft, non-tender. No rebound or rigidity.   Skin: Exposed areas without rash. Not pale. Not jaundice Neurologic:  alert & oriented X3.  Speech normal, gait appropriate for age and unassisted Strength symmetric and appropriate for age.  Psych: Cognition and judgment appear intact.  Cooperative with normal attention span and concentration.  Behavior appropriate. No anxious or  depressed appearing.    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 CXR 03/27/2016: No acute. Pulmonary: f/u prn ++ FH Lung Ca (pt is not a smoker)  PLAN: HTN: Seems controlled, continue Benicar High cholesterol: On Pravachol, last FLP satisfactory Hypothyroidism: Last TSH satisfactory, continue Synthroid Osteopenia:  On Ca and vitamin D History of BCC, FH of melanoma: Not seen in 5 years, refer to Dr. Allyson Sabal. RTC 6 months.

## 2016-09-14 NOTE — Assessment & Plan Note (Signed)
HTN: Seems controlled, continue Benicar High cholesterol: On Pravachol, last FLP satisfactory Hypothyroidism: Last TSH satisfactory, continue Synthroid Osteopenia:  On Ca and vitamin D History of BCC, FH of melanoma: Not seen in 5 years, refer to Dr. Allyson Sabal. RTC 6 months.

## 2016-09-18 ENCOUNTER — Telehealth: Payer: Self-pay | Admitting: Internal Medicine

## 2016-09-18 ENCOUNTER — Encounter: Payer: Self-pay | Admitting: Internal Medicine

## 2016-09-18 NOTE — Telephone Encounter (Signed)
Relation to [id number] Call back number:[phone] [id number] - [place], [place].  Reason for call:  Patient would like to discuss voucher for olmesartan (BENICAR) 40 MG tablet, please advise. Patient states spouse is seeing Dr. Larose Kells today and if possible would like voucher to be given to spouse but patient would like to discuss prior. Please advise.

## 2016-09-18 NOTE — Telephone Encounter (Signed)
Spoke w/ Pt, informed her that we did not currently have any discount cards for Benicar. Informed she may be able to locate one online via manufacturers website. Pt verbalized understanding.

## 2016-09-22 ENCOUNTER — Other Ambulatory Visit: Payer: Self-pay | Admitting: Internal Medicine

## 2016-09-26 ENCOUNTER — Other Ambulatory Visit: Payer: Self-pay | Admitting: Hematology and Oncology

## 2016-09-26 DIAGNOSIS — Z853 Personal history of malignant neoplasm of breast: Secondary | ICD-10-CM

## 2016-10-09 DIAGNOSIS — D1801 Hemangioma of skin and subcutaneous tissue: Secondary | ICD-10-CM | POA: Diagnosis not present

## 2016-10-09 DIAGNOSIS — L821 Other seborrheic keratosis: Secondary | ICD-10-CM | POA: Diagnosis not present

## 2016-10-09 DIAGNOSIS — D225 Melanocytic nevi of trunk: Secondary | ICD-10-CM | POA: Diagnosis not present

## 2016-10-09 DIAGNOSIS — L814 Other melanin hyperpigmentation: Secondary | ICD-10-CM | POA: Diagnosis not present

## 2016-10-12 ENCOUNTER — Telehealth: Payer: Self-pay | Admitting: Internal Medicine

## 2016-10-12 MED ORDER — BENICAR 40 MG PO TABS: 40.0000 mg | ORAL_TABLET | Freq: Every day | ORAL | 1 refills | Status: DC

## 2016-10-12 NOTE — Telephone Encounter (Signed)
Patient called stating that if [facility] does not take the card she does have a fax number [phone].

## 2016-10-12 NOTE — Telephone Encounter (Signed)
Pt says that she is in a donut whole. Pt says that she is taking Olmesartan and it is very expensive. She says that she would like to know if provider could write her a Rx for 22 pills at 40MG  until January and then she will go back to getting her normal 3 months supply. Pt would like a call back to discuss further.    CB: [phone]

## 2016-10-12 NOTE — Telephone Encounter (Signed)
Please advise 

## 2016-10-12 NOTE — Telephone Encounter (Signed)
Spoke w/ Pt, she is currently in donut hole and can't afford 90 day supply of Benicar and unable to take generic Olmesartan. Found savings coupon online for Benicar, will print 30 day supply of brand name Benicar 40mg  and [facility]/ [facility] card [facility]. Informed Pt where she can also find the savings card online.

## 2016-10-12 NOTE — Telephone Encounter (Signed)
Ok to write a Rx as requested by patient . rec not to run out of meds

## 2016-10-12 NOTE — Telephone Encounter (Signed)
Rx and [facility] card [facility]ed to [facility].

## 2016-10-15 ENCOUNTER — Ambulatory Visit
Admission: RE | Admit: 2016-10-15 | Discharge: 2016-10-15 | Disposition: A | Discharge status: Home / Self Care | Payer: Medicare Other | Source: Ambulatory Visit | Attending: Hematology and Oncology | Admitting: Hematology and Oncology

## 2016-10-15 DIAGNOSIS — R922 Inconclusive mammogram: Secondary | ICD-10-CM | POA: Diagnosis not present

## 2016-10-15 DIAGNOSIS — Z853 Personal history of malignant neoplasm of breast: Secondary | ICD-10-CM

## 2016-11-05 ENCOUNTER — Encounter: Payer: Self-pay | Admitting: Internal Medicine

## 2016-11-05 ENCOUNTER — Other Ambulatory Visit: Payer: Self-pay | Admitting: Hematology and Oncology

## 2016-11-05 ENCOUNTER — Ambulatory Visit (INDEPENDENT_AMBULATORY_CARE_PROVIDER_SITE_OTHER): Payer: Medicare Other | Admitting: Internal Medicine

## 2016-11-05 VITALS — BP 128/78 | HR 91 | Temp 98.1°F | Resp 14 | Ht 63.0 in | Wt 211.4 lb

## 2016-11-05 DIAGNOSIS — L237 Allergic contact dermatitis due to plants, except food: Secondary | ICD-10-CM | POA: Diagnosis not present

## 2016-11-05 DIAGNOSIS — R928 Other abnormal and inconclusive findings on diagnostic imaging of breast: Secondary | ICD-10-CM | POA: Diagnosis not present

## 2016-11-05 DIAGNOSIS — R921 Mammographic calcification found on diagnostic imaging of breast: Secondary | ICD-10-CM

## 2016-11-05 MED ORDER — BETAMETHASONE DIPROPIONATE AUG 0.05 % EX CREA: TOPICAL_CREAM | Freq: Two times a day (BID) | CUTANEOUS | 0 refills | Status: DC

## 2016-11-05 NOTE — Progress Notes (Signed)
Subjective:    Patient ID: Lori Joseph, female    DOB: [birth year], 75 y.o.   MRN: [id number]  DOS:  11/05/2016 Type of visit - description : Acute visit Interval history: Few days ago, she help her to clear some bushes. Shortly after developed achy rash on different areas. Using OTC calamine, little help. Likes to discuss her most recent mammogram  Review of Systems Denies fever, chills.  Past Medical History:  Diagnosis Date  . Allergic rhinitis   . Anemia   . Cancer [facility])    breast -left  . Family history of anesthesia complication    daughter hard to wake up  . Glaucoma   . H/O acute pancreatitis   . H/O retinal detachment   . Hurthle cell neoplasm of thyroid    Right   . HYPERLIPIDEMIA   . Hypertension   . Hypothyroidism 05/31/2015  . OSTEOPENIA   . Primary cancer of upper outer quadrant of left female breast ([place])    Dr. Lindi Adie  . Radiation 11/11/15-12/30/14   left breast 50.4 gray, lumpectomy cavity boosted to 62.4 gray    Past Surgical History:  Procedure Laterality Date  . BREAST BIOPSY Left 08/27/14  . CHOLECYSTECTOMY  2000  . DILATION AND CURETTAGE OF UTERUS    . laser eye surgery, detached retina Left   . RADIOACTIVE SEED GUIDED MASTECTOMY WITH AXILLARY SENTINEL LYMPH NODE BIOPSY Left 09/16/2014   Procedure: RADIOACTIVE SEED GUIDED PARTIAL MASTECTOMY WITH AXILLARY SENTINEL LYMPH NODE BIOPSY;  Surgeon: [name], MD;  Location: [facility];  Service: General;  Laterality: Left;  . thyoidectomy Left    partial  . THYROID LOBECTOMY N/A 02/10/2015   Procedure: RIGHT THYROID LOBECTOMY;  Surgeon: [name], MD;  Location: [facility];  Service: General;  Laterality: N/A;  . TONSILLECTOMY    . TUBAL LIGATION      Social History   Social History  . Marital status: Married    Spouse name: Ruthann Cancer  . Number of children: 2  . Years of education: N/A   Occupational History  . 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 Smoking status: Never Smoker  . Smokeless tobacco: Never Used  . Alcohol use 0.0 oz/week     Comment: rarely wine  . Drug use: No  . Sexual activity: Not on file   Other Topics Concern  . Not on file   Social History Narrative   Lives w/ husband        Medication List       Accurate as of 11/05/16  1:13 PM. Always use your most recent med list.          anastrozole 1 MG tablet Commonly known as:  ARIMIDEX TAKE 1 TABLET BY MOUTH EVERY DAY   aspirin 81 MG EC tablet Take 81 mg by mouth every morning.   BENICAR 40 MG tablet Generic drug:  olmesartan Take 1 tablet (40 mg total) by mouth daily.   calcium carbonate 1250 MG capsule Take 2,500 mg by mouth every morning.   cholecalciferol 1000 units tablet Commonly known as:  VITAMIN D Take 2,500 Units by mouth every morning.   dorzolamide-timolol 22.3-6.8 MG/ML ophthalmic solution Commonly known as:  COSOPT Place 1 drop into both eyes every morning.   fluticasone 0.005 % ointment Commonly known as:  CUTIVATE Apply 1 application topically 3 (three) times daily as needed.   latanoprost 0.005 % ophthalmic solution Commonly known  as:  XALATAN Place 1 drop into both eyes at bedtime.   levothyroxine 25 MCG tablet Commonly known as:  SYNTHROID, LEVOTHROID Take 1 tablet (25 mcg total) by mouth daily before breakfast.   multivitamin tablet Take 1 tablet by mouth every morning.   pravastatin 40 MG tablet Commonly known as:  PRAVACHOL Take 1 tablet (40 mg total) by mouth daily.          Objective:   Physical Exam BP 128/78 (BP Location: Right Arm, Patient Position: Sitting, Cuff Size: Normal)   Pulse 91   Temp 98.1 F (36.7 C) (Oral)   Resp 14   Ht 5\' 3"  (1.6 m)   Wt 211 lb 6 oz (95.9 kg)   SpO2 98%   BMI 37.44 kg/m  General:   Well developed, well nourished . NAD.  HEENT:  Normocephalic . Face symmetric, atraumatic Skin:  Several areas of maculopapular redness, few blisters,  many are linear no more than 3 cm in size. They are on the neck, both forearms, right flank. Neurologic:  alert & oriented X3.  Speech normal, gait appropriate for age and unassisted Psych--  Cognition and judgment appear intact.  Cooperative with normal attention span and concentration.  Behavior appropriate. No anxious or depressed appearing.      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 CXR 03/27/2016: No acute. Pulmonary: f/u prn ++ FH Lung Ca (pt is not a smoker)  PLAN: Poison ivy: Findings c/w  poison ivy, declined it oral prednisone. Will treat with TOPICAL steroids, Benadryl, avoidance >>cleaning her furniture and clothing. Call if no better Abnormal mammogram: Patient wonders what she should do regards abnormal mammogram, her options are recheck in  6 months or do a biopsy. She is high risk, there is no right or wrong decision however at the end of our conversation she decided she will proceed with a biopsy within the next few weeks.

## 2016-11-05 NOTE — Patient Instructions (Signed)
Apply the cream twice a day as needed  OTC Benadryl as needed for itching  Wash all your clothing and clean all your furniture  Call if not gradually improving

## 2016-11-05 NOTE — Progress Notes (Signed)
Pre visit review using our clinic review tool, if applicable. No additional management support is needed unless otherwise documented below in the visit note. 

## 2016-11-06 NOTE — Assessment & Plan Note (Signed)
Poison ivy: Findings c/w  poison ivy, declined it oral prednisone. Will treat with TOPICAL steroids, Benadryl, avoidance >>cleaning her furniture and clothing. Call if no better Abnormal mammogram: Patient wonders what she should do regards abnormal mammogram, her options are recheck in  6 months or do a biopsy. She is high risk, there is no right or wrong decision however at the end of our conversation she decided she will proceed with a biopsy within the next few weeks.

## 2016-11-26 HISTORY — PX: OTHER SURGICAL HISTORY: SHX169

## 2016-11-27 ENCOUNTER — Ambulatory Visit
Admission: RE | Admit: 2016-11-27 | Discharge: 2016-11-27 | Disposition: A | Discharge status: Home / Self Care | Payer: Medicare Other | Source: Ambulatory Visit | Attending: Hematology and Oncology | Admitting: Hematology and Oncology

## 2016-11-27 DIAGNOSIS — R921 Mammographic calcification found on diagnostic imaging of breast: Secondary | ICD-10-CM

## 2016-11-27 DIAGNOSIS — N6012 Diffuse cystic mastopathy of left breast: Secondary | ICD-10-CM | POA: Diagnosis not present

## 2016-11-27 HISTORY — PX: BREAST BIOPSY: SHX20

## 2016-11-28 ENCOUNTER — Other Ambulatory Visit: Payer: Self-pay

## 2016-11-28 MED ORDER — BENICAR 40 MG PO TABS: 40.0000 mg | ORAL_TABLET | Freq: Every day | ORAL | 1 refills | Status: DC

## 2016-12-19 DIAGNOSIS — Z124 Encounter for screening for malignant neoplasm of cervix: Secondary | ICD-10-CM | POA: Diagnosis not present

## 2016-12-19 DIAGNOSIS — Z01419 Encounter for gynecological examination (general) (routine) without abnormal findings: Secondary | ICD-10-CM | POA: Diagnosis not present

## 2016-12-21 NOTE — Progress Notes (Deleted)
Subjective:   Lori Joseph is a 76 y.o. female who presents for Medicare Annual (Subsequent) preventive examination.  Review of Systems:  No ROS.  Medicare Wellness Visit.   Sleep patterns: {SX; SLEEP PATTERNS:18802::"feels rested on waking","does not get up to void","gets up *** times nightly to void","*** hours nightly"}.   Home Safety/Smoke Alarms:   Living environment; residence and Firearm Safety: {Rehab home environment / accessibility:30080::"no firearms","firearms stored safely"}. Seat Belt Safety/Bike Helmet: Wears seat belt.   Counseling:   Eye Exam-  Dental-  Female:   Pap-       Mammo- Last 10/15/16: BI-RADS CATEGORY  3: Probably benign.       Dexa scan- Last 07/02/12: Osteopenia     CCS- Last 09/12/16: Precancerous polyps completely removed. No routine screening recommended due to age.     Objective:     Vitals: There were no vitals taken for this visit.  There is no height or weight on file to calculate BMI.   Tobacco History  Smoking Status  . Never Smoker  Smokeless Tobacco  . Never Used     Counseling given: Not Answered   Past Medical History:  Diagnosis Date  . Allergic rhinitis   . Anemia   . Cancer [facility])    breast -left  . Family history of anesthesia complication    daughter hard to wake up  . Glaucoma   . H/O acute pancreatitis   . H/O retinal detachment   . Hurthle cell neoplasm of thyroid    Right   . HYPERLIPIDEMIA   . Hypertension   . Hypothyroidism 05/31/2015  . OSTEOPENIA   . Primary cancer of upper outer quadrant of left female breast ([place])    Dr. Lindi Adie  . Radiation 11/11/15-12/30/14   left breast 50.4 gray, lumpectomy cavity boosted to 62.4 gray   Past Surgical History:  Procedure Laterality Date  . BREAST BIOPSY Left 08/27/14  . CHOLECYSTECTOMY  2000  . DILATION AND CURETTAGE OF UTERUS    . laser eye surgery, detached retina Left   . RADIOACTIVE SEED GUIDED MASTECTOMY WITH AXILLARY SENTINEL LYMPH NODE BIOPSY Left  09/16/2014   Procedure: RADIOACTIVE SEED GUIDED PARTIAL MASTECTOMY WITH AXILLARY SENTINEL LYMPH NODE BIOPSY;  Surgeon: [name], MD;  Location: [facility];  Service: General;  Laterality: Left;  . thyoidectomy Left    partial  . THYROID LOBECTOMY N/A 02/10/2015   Procedure: RIGHT THYROID LOBECTOMY;  Surgeon: [name], MD;  [facility];  Service: General;  Laterality: N/A;  . TONSILLECTOMY    . TUBAL LIGATION     Family History  Problem Relation Age of Onset  . Breast cancer Sister 31  . Cancer Father     lung cancer ; smoker  . Cancer Brother     3 brothers with lung cancer, all smokers  . Cancer Paternal Uncle     2 pat uncles with lung cancer, smokers  . Kidney cancer Sister 58  . Melanoma Sister 74  . Ovarian cancer Other 59    niece with ovarian cancer (related through sister with breast cancer)  . Colon cancer Neg Hx   . CAD Neg Hx   . Esophageal cancer Neg Hx   . Stomach cancer Neg Hx   . Rectal cancer Neg Hx    History  Sexual Activity  . Sexual activity: Not on file    Outpatient Encounter Prescriptions as of 12/25/2016  Medication Sig  . anastrozole (ARIMIDEX) 1 MG tablet  TAKE 1 TABLET BY MOUTH EVERY DAY  . aspirin 81 MG EC tablet Take 81 mg by mouth every morning.   Marland Kitchen augmented betamethasone dipropionate (DIPROLENE-AF) 0.05 % cream Apply topically 2 (two) times daily.  Marland Kitchen BENICAR 40 MG tablet Take 1 tablet (40 mg total) by mouth daily.  . calcium carbonate 1250 MG capsule Take 2,500 mg by mouth every morning.   . cholecalciferol (VITAMIN D) 1000 UNITS tablet Take 2,500 Units by mouth every morning.   . dorzolamide-timolol (COSOPT) 22.3-6.8 MG/ML ophthalmic solution Place 1 drop into both eyes every morning.   . fluticasone (CUTIVATE) 0.005 % ointment Apply 1 application topically 3 (three) times daily as needed.  . latanoprost (XALATAN) 0.005 % ophthalmic solution Place 1 drop into both eyes at bedtime.  Marland Kitchen levothyroxine (SYNTHROID,  LEVOTHROID) 25 MCG tablet Take 1 tablet (25 mcg total) by mouth daily before breakfast.  . Multiple Vitamin (MULTIVITAMIN) tablet Take 1 tablet by mouth every morning.   . pravastatin (PRAVACHOL) 40 MG tablet Take 1 tablet (40 mg total) by mouth daily.   Facility-Administered Encounter Medications as of 12/25/2016  Medication  . 0.9 %  sodium chloride infusion    Activities of Daily Living In your present state of health, do you have any difficulty performing the following activities: 09/14/2016 07/10/2016  Hearing? N N  Vision? N N  Difficulty concentrating or making decisions? N N  Walking or climbing stairs? N N  Dressing or bathing? N N  Doing errands, shopping? N N  Some recent data might be hidden    Patient Care Team: Colon Branch, MD as PCP - General [name], MD as Consulting Physician (Hematology and Oncology) [name], MD as Consulting Physician (General Surgery) Marylynn Pearson, MD as Consulting Physician (Obstetrics and Gynecology) Irene Shipper, MD as Consulting Physician (Gastroenterology) Gery Pray, MD as Consulting Physician (Radiation Oncology) Druscilla Brownie, MD as Consulting Physician (Dermatology)    Assessment:    Physical assessment deferred to PCP.  Exercise Activities and Dietary recommendations   Diet (meal preparation, eat out, water intake, caffeinated beverages, dairy products, fruits and vegetables): {Desc; diets:16563} Breakfast: Lunch:  Dinner:      Goals    None     Fall Risk Fall Risk  09/14/2016 07/10/2016 02/21/2016 11/23/2015 11/10/2015  Falls in the past year? No No No No No  Number falls in past yr: - - - - -  Injury with Fall? - - - - -  Risk for fall due to : - - - - -   Depression Screen PHQ 2/9 Scores 09/14/2016 07/10/2016 02/21/2016 10/31/2015  PHQ - 2 Score 0 0 0 0  Exception Documentation - - - Patient refusal     Cognitive Function        Immunization History  Administered Date(s) Administered  .  Influenza Whole 10/06/2008, 09/28/2009, 08/22/2010  . Influenza, High Dose Seasonal PF 10/16/2013, 08/24/2015, 09/14/2016  . Influenza,inj,Quad PF,36+ Mos 08/05/2014  . Pneumococcal Conjugate-13 08/05/2014  . Pneumococcal Polysaccharide-23 10/15/2007  . Td 10/15/2007   Screening Tests Health Maintenance  Topic Date Due  . DEXA SCAN  07/02/2014  . TETANUS/TDAP  10/14/2017  . COLONOSCOPY  09/12/2026  . INFLUENZA VACCINE  Completed  . PNA vac Low Risk Adult  Completed      Plan:   *** During the course of the visit the patient was educated and counseled about the following appropriate screening and preventive services:   Vaccines to include Pneumoccal, Influenza, Hepatitis  B, Td, Zostavax, HCV  Electrocardiogram  Cardiovascular Disease  Colorectal cancer screening  Bone density screening  Diabetes screening  Glaucoma screening  Mammography/PAP  Nutrition counseling   Patient Instructions (the written plan) was given to the patient.   Shela Nevin, [place]  12/21/2016

## 2016-12-21 NOTE — Progress Notes (Deleted)
Pre visit review using our clinic review tool, if applicable. No additional management support is needed unless otherwise documented below in the visit note. 

## 2016-12-23 NOTE — Assessment & Plan Note (Signed)
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 N0 M0 Oncotype DX recurrence score 15, 9% ROR Currently on anastrozole 1 mg daily started 01/25/2015.  Anastrozole toxicities: Patient denies any problems or concerns taking Anastrozole. She denies any hot flashes or myalgias. Breast cancer surveillance: 1. Breast exam 12/24/16 is normal, Dryness of the skin around the left nipple improved with Vaseline. 2. Mammograms 10/14/2015: Benign  Right flank pain: resolved with a spine injection.  Return to clinic in 6 months for follow up

## 2016-12-24 ENCOUNTER — Encounter: Payer: Self-pay | Admitting: Hematology and Oncology

## 2016-12-24 ENCOUNTER — Telehealth: Payer: Self-pay | Admitting: Internal Medicine

## 2016-12-24 ENCOUNTER — Ambulatory Visit (HOSPITAL_BASED_OUTPATIENT_CLINIC_OR_DEPARTMENT_OTHER): Payer: Medicare Other | Admitting: Hematology and Oncology

## 2016-12-24 DIAGNOSIS — C50412 Malignant neoplasm of upper-outer quadrant of left female breast: Secondary | ICD-10-CM | POA: Diagnosis not present

## 2016-12-24 DIAGNOSIS — Z170 Estrogen receptor positive status [ER+]: Secondary | ICD-10-CM | POA: Diagnosis not present

## 2016-12-24 MED ORDER — ANASTROZOLE 1 MG PO TABS: 1.0000 mg | ORAL_TABLET | Freq: Every day | ORAL | 3 refills | Status: DC

## 2016-12-24 MED ORDER — OSELTAMIVIR PHOSPHATE 75 MG PO CAPS: 75.0000 mg | ORAL_CAPSULE | Freq: Every day | ORAL | 0 refills | Status: DC

## 2016-12-24 NOTE — Telephone Encounter (Signed)
If she has no sx, to take tamiflu 75 mg 1 po qd #10, no RF If she has sx let me know

## 2016-12-24 NOTE — Telephone Encounter (Signed)
Rx sent. Spoke w/ Pt, informed her of recommendations.

## 2016-12-24 NOTE — Telephone Encounter (Signed)
°  Relation to [id number] Call back number: [phone]  Pharmacy: [id number] - [place], [address] [place]. [phone] (Phone) [phone] (Fax)     Reason for call:  Patient spouse was Dx with the flu, requesting TamiFlu, please advise

## 2016-12-24 NOTE — Telephone Encounter (Signed)
Please advise 

## 2016-12-24 NOTE — Progress Notes (Signed)
Patient Care Team: Colon Branch, MD as PCP - General [name], MD as Consulting Physician (Hematology and Oncology) [name], MD as Consulting Physician (General Surgery) Marylynn Pearson, MD as Consulting Physician (Obstetrics and Gynecology) Irene Shipper, MD as Consulting Physician (Gastroenterology) Gery Pray, MD as Consulting Physician (Radiation Oncology) Druscilla Brownie, MD as Consulting Physician (Dermatology)  DIAGNOSIS:  Encounter Diagnosis  Name Primary?  . 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 N0 M0 Oncotype DX recurrence score 15, 9% ROR      01/25/2015 -  Anti-estrogen oral therapy    Anastrozole 1 mg daily 5 years      02/10/2015 Surgery    Right thyroid lobectomy: Hurthle cell neoplasm 2.3 cm, adenomatous nodules       CHIEF COMPLIANT:   INTERVAL HISTORY: Lori Joseph is a  REVIEW OF SYSTEMS:   Constitutional: Denies fevers, chills or abnormal weight loss Eyes: Denies blurriness of vision Ears, nose, mouth, throat, and face: Denies mucositis or sore throat Respiratory: Denies cough, dyspnea or wheezes Cardiovascular: Denies palpitation, chest discomfort Gastrointestinal:  Denies nausea, heartburn or change in bowel habits Skin: Denies abnormal skin rashes Lymphatics: Denies new lymphadenopathy or easy bruising Neurological:Denies numbness, tingling or new weaknesses Behavioral/Psych: Mood is stable, no new changes  Extremities: No lower extremity edema Breast:  denies any pain or lumps or nodules in either breasts All other systems were reviewed with the patient and are negative.  I have reviewed the past  medical history, past surgical history, social history and family history with the patient and they are unchanged from previous note.  ALLERGIES:  is allergic to codeine; hyaluronic acid  [collagen-chond-hyaluronic acid]; carvedilol; felodipine; lisinopril; losartan potassium; and radiaplexrx [pyridoxine-zinc picolinate].  MEDICATIONS:  Current Outpatient Prescriptions  Medication Sig Dispense Refill  . anastrozole (ARIMIDEX) 1 MG tablet Take 1 tablet (1 mg total) by mouth daily. 90 tablet 3  . aspirin 81 MG EC tablet Take 81 mg by mouth every morning.     Marland Kitchen augmented betamethasone dipropionate (DIPROLENE-AF) 0.05 % cream Apply topically 2 (two) times daily. 60 g 0  . BENICAR 40 MG tablet Take 1 tablet (40 mg total) by mouth daily. 90 tablet 1  . calcium carbonate 1250 MG capsule Take 2,500 mg by mouth every morning.     . cholecalciferol (VITAMIN D) 1000 UNITS tablet Take 2,500 Units by mouth every morning.     . dorzolamide-timolol (COSOPT) 22.3-6.8 MG/ML ophthalmic solution Place 1 drop into both eyes every morning.     . fluticasone (CUTIVATE) 0.005 % ointment Apply 1 application topically 3 (three) times daily as needed. 60 g 3  . latanoprost (XALATAN) 0.005 % ophthalmic solution Place 1 drop into both eyes at bedtime.    Marland Kitchen levothyroxine (SYNTHROID, LEVOTHROID) 25 MCG tablet Take 1 tablet (25 mcg total) by mouth daily before breakfast. 90 tablet 1  . Multiple Vitamin (MULTIVITAMIN) tablet Take 1 tablet by mouth every morning.     Marland Kitchen oseltamivir (TAMIFLU) 75 MG capsule Take 1 capsule (75 mg total) by mouth daily. 10 capsule 0  . pravastatin (PRAVACHOL) 40 MG tablet Take 1 tablet (40  mg total) by mouth daily. 90 tablet 1   Current Facility-Administered Medications  Medication Dose Route Frequency Provider Last Rate Last Dose  . 0.9 %  sodium chloride infusion  500 mL Intravenous Continuous Irene Shipper, MD        PHYSICAL EXAMINATION: ECOG PERFORMANCE STATUS: 1 - Symptomatic but  completely ambulatory  Vitals:   12/24/16 1410  BP: (!) 131/57  Pulse: 65  Resp: 18  Temp: 97.9 F (36.6 C)   Filed Weights   12/24/16 1410  Weight: 214 lb 6.4 oz (97.3 kg)    GENERAL:alert, no distress and comfortable SKIN: skin color, texture, turgor are normal, no rashes or significant lesions EYES: normal, Conjunctiva are pink and non-injected, sclera clear OROPHARYNX:no exudate, no erythema and lips, buccal mucosa, and tongue normal  NECK: supple, thyroid normal size, non-tender, without nodularity LYMPH:  no palpable lymphadenopathy in the cervical, axillary or inguinal LUNGS: clear to auscultation and percussion with normal breathing effort HEART: regular rate & rhythm and no murmurs and no lower extremity edema ABDOMEN:abdomen soft, non-tender and normal bowel sounds MUSCULOSKELETAL:no cyanosis of digits and no clubbing  NEURO: alert & oriented x 3 with fluent speech, no focal motor/sensory deficits EXTREMITIES: No lower extremity edema BREAST: Bruising and hematoma from recent biopsy. (exam performed in the presence of a chaperone)  LABORATORY DATA:  I have reviewed the data as listed   Chemistry      Component Value Date/Time   NA 141 07/10/2016 0926   NA 140 11/10/2015 1129   K 4.4 07/10/2016 0926   K 4.0 11/10/2015 1129   CL 105 07/10/2016 0926   CO2 27 07/10/2016 0926   CO2 23 11/10/2015 1129   BUN 14 07/10/2016 0926   BUN 17.0 11/10/2015 1129   CREATININE 0.86 07/10/2016 0926   CREATININE 0.8 11/10/2015 1129      Component Value Date/Time   CALCIUM 9.7 07/10/2016 0926   CALCIUM 9.2 11/10/2015 1129   ALKPHOS 100 11/10/2015 1129   AST 15 11/10/2015 1129   ALT 14 11/10/2015 1129   BILITOT 0.47 11/10/2015 1129       Lab Results  Component Value Date   WBC 4.4 01/30/2016   HGB 11.3 (L) 01/30/2016   HCT 33.2 (L) 01/30/2016   MCV 91.0 01/30/2016   PLT 186.0 01/30/2016   NEUTROABS 3.0 01/30/2016    ASSESSMENT & PLAN:  Breast cancer of  upper-outer quadrant of left female breast Left breast lumpectomy 09/14/2014: 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 N0 M0 Oncotype DX recurrence score 15, 9% ROR Currently on anastrozole 1 mg daily started 01/25/2015.  Anastrozole toxicities: Patient denies any problems or concerns taking Anastrozole. She denies any hot flashes or myalgias. Breast cancer surveillance: 1. Breast exam 12/24/16  has evidence of recent biopsy with a small hematoma or bruising. Breast biopsy on 12/19/2016 was benign. 2. Mammograms 10/15/2016: Calcifications biopsy: Benign  Right flank pain: resolved with a spine injection.  Return to clinic in 1 yr for follow up   I spent 25 minutes talking to the patient of which more than half was spent in counseling and coordination of care.  No orders of the defined types were placed in this encounter.  The patient has a good understanding of the overall plan. she agrees with it. she will call with any problems that may develop before the next visit here.   Lindi Adie,  Tyler Deis, MD 12/24/16

## 2016-12-25 ENCOUNTER — Ambulatory Visit: Payer: Medicare Other | Admitting: *Deleted

## 2016-12-27 ENCOUNTER — Encounter: Payer: Self-pay | Admitting: Family Medicine

## 2016-12-27 ENCOUNTER — Ambulatory Visit (INDEPENDENT_AMBULATORY_CARE_PROVIDER_SITE_OTHER): Payer: Medicare Other | Admitting: Family Medicine

## 2016-12-27 VITALS — BP 140/76 | HR 85 | Temp 98.7°F | Ht 63.0 in | Wt 211.6 lb

## 2016-12-27 DIAGNOSIS — R6889 Other general symptoms and signs: Secondary | ICD-10-CM

## 2016-12-27 MED ORDER — OSELTAMIVIR PHOSPHATE 75 MG PO CAPS: 75.0000 mg | ORAL_CAPSULE | Freq: Two times a day (BID) | ORAL | 0 refills | Status: DC

## 2016-12-27 NOTE — Progress Notes (Signed)
Pre visit review using our clinic review tool, if applicable. No additional management support is needed unless otherwise documented below in the visit note. 

## 2016-12-27 NOTE — Progress Notes (Signed)
[place] at [facility] [address], [place], [place] [address] [phone]  Date:  12/27/2016   Name:  Lori Joseph   DOB:  [birth year]   MRN:  [id number]  PCP:  Kathlene November, MD    Chief Complaint: Nasal Congestion (c/o nasal congestion, body aches, chills, fever, dry cough, dull headache and fatigue. Pt states that sx's started last night. Pt's husband was sick recently. )   History of Present Illness:  Lori Joseph is a 76 y.o. very pleasant female patient who presents with the following:  Here today with illness- she got sick about 48 hours ago. Her temp was 101 She has also noted chills, body aches, fatigue, chest congestion, cough, ST, runny nose and sneezing No vomiting She is able to eat but not much appetite She took some robitussin DM, tylenol.   Last dose of tylenol this am  Last CXR in May was normal  Her husband was ill starting on Sunday (today is Thursday)- he had very similar symptoms but did not seek care.  He is now feeling better We called in prophylactic tamiflu for her at that time but she did not take it  Patient Active Problem List   Diagnosis Date Noted  . Pulmonary infiltrates on CXR 01/30/2016  . Upper airway cough syndrome 01/30/2016  . PCP NOTES >>>>>>>>>>>>>>>> 08/25/2015  . Hypothyroidism 05/31/2015  . Family history of malignant neoplasm of breast 03/11/2015  . Family history of malignant neoplasm of ovary 03/11/2015  . Breast cancer ([place]) 03/11/2015  . PCP comments--R Chest wall and flank pain 12/31/2014  . 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/2014  . Chest pain 12/31/2014  . Breast cancer of upper-outer quadrant of left female breast ([place] [place]) 09/08/2014  . Eczema 08/05/2014  . Annual physical exam 06/18/2012  . BCC (basal cell carcinoma of skin) 06/18/2012  . OTHER&UNSPECIFIED DISEASES THE ORAL SOFT TISSUES 02/10/2011  .  HYPERLIPIDEMIA 01/12/2011  . OSTEOPENIA 09/28/2009  . 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 OBESITY 05/18/2009  . Essential hypertension 10/15/2007  . PANCREATITIS, HX OF 03/13/2007    Past Medical History:  Diagnosis Date  . Allergic rhinitis   . Anemia   . Cancer [facility])    breast -left  . Family history of anesthesia complication    daughter hard to wake up  . Glaucoma   . H/O acute pancreatitis   . H/O retinal detachment   . Hurthle cell neoplasm of thyroid    Right   . HYPERLIPIDEMIA   . Hypertension   . Hypothyroidism 05/31/2015  . OSTEOPENIA   . Primary cancer of upper outer quadrant of left female breast ([place])    Dr. Lindi Adie  . Radiation 11/11/15-12/30/14   left breast 50.4 gray, lumpectomy cavity boosted to 62.4 gray    Past Surgical History:  Procedure Laterality Date  . BREAST BIOPSY Left 08/27/14  . CHOLECYSTECTOMY  2000  . DILATION AND CURETTAGE OF UTERUS    . laser eye surgery, detached retina Left   . RADIOACTIVE SEED GUIDED MASTECTOMY WITH AXILLARY SENTINEL LYMPH NODE BIOPSY Left 09/16/2014   Procedure: RADIOACTIVE SEED GUIDED PARTIAL MASTECTOMY WITH AXILLARY SENTINEL LYMPH NODE BIOPSY;  Surgeon: [name], MD;  Location: [facility];  Service: General;  Laterality: Left;  . thyoidectomy Left    partial  . THYROID LOBECTOMY N/A 02/10/2015   Procedure: RIGHT THYROID LOBECTOMY;  Surgeon: [name], MD;  Location: [facility];  Service: General;  Laterality: N/A;  . TONSILLECTOMY    . TUBAL LIGATION      Social History  Substance Use Topics  . Smoking status: Never Smoker  . Smokeless tobacco: Never Used  . Alcohol use 0.0 oz/week     Comment: rarely wine    Family History  Problem Relation Age of Onset  . Breast cancer Sister 36  . Cancer Father     lung cancer ; smoker  . Cancer Brother     3 brothers with lung cancer, all smokers  . Cancer Paternal Uncle     2 pat uncles with lung cancer, smokers  . Kidney cancer Sister 20  .  Melanoma Sister 21  . Ovarian cancer Other 60    niece with ovarian cancer (related through sister with breast cancer)  . Colon cancer Neg Hx   . CAD Neg Hx   . Esophageal cancer Neg Hx   . Stomach cancer Neg Hx   . Rectal cancer Neg Hx     Allergies  Allergen Reactions  . Codeine Anaphylaxis  . Hyaluronic Acid  [Collagen-Chond-Hyaluronic Acid] Itching and Rash  . Carvedilol     REACTION: leg pain, edema  . Felodipine     REACTION: HA, insomnia  . Lisinopril     REACTION: HA, SWELLING  . Losartan Potassium     REACTION: cough  . Radiaplexrx [Pyridoxine-Zinc Picolinate] Itching and Rash    Medication list has been reviewed and updated.  Current Outpatient Prescriptions on File Prior to Visit  Medication Sig Dispense Refill  . anastrozole (ARIMIDEX) 1 MG tablet Take 1 tablet (1 mg total) by mouth daily. 90 tablet 3  . aspirin 81 MG EC tablet Take 81 mg by mouth every morning.     Marland Kitchen augmented betamethasone dipropionate (DIPROLENE-AF) 0.05 % cream Apply topically 2 (two) times daily. 60 g 0  . BENICAR 40 MG tablet Take 1 tablet (40 mg total) by mouth daily. 90 tablet 1  . calcium carbonate 1250 MG capsule Take 2,500 mg by mouth every morning.     . cholecalciferol (VITAMIN D) 1000 UNITS tablet Take 2,500 Units by mouth every morning.     . dorzolamide-timolol (COSOPT) 22.3-6.8 MG/ML ophthalmic solution Place 1 drop into both eyes every morning.     . fluticasone (CUTIVATE) 0.005 % ointment Apply 1 application topically 3 (three) times daily as needed. 60 g 3  . latanoprost (XALATAN) 0.005 % ophthalmic solution Place 1 drop into both eyes at bedtime.    Marland Kitchen levothyroxine (SYNTHROID, LEVOTHROID) 25 MCG tablet Take 1 tablet (25 mcg total) by mouth daily before breakfast. 90 tablet 1  . Multiple Vitamin (MULTIVITAMIN) tablet Take 1 tablet by mouth every morning.     . pravastatin (PRAVACHOL) 40 MG tablet Take 1 tablet (40 mg total) by mouth daily. 90 tablet 1   Current  Facility-Administered Medications on File Prior to Visit  Medication Dose Route Frequency Provider Last Rate Last Dose  . 0.9 %  sodium chloride infusion  500 mL Intravenous Continuous Irene Shipper, MD        Review of Systems:  As per HPI- otherwise negative.   Physical Examination: Vitals:   12/27/16 1808  BP: 140/76  Pulse: 85  Temp: 98.7 F (37.1 C)   Vitals:   12/27/16 1808  Weight: 211 lb 9.6 oz (96 kg)  Height: 5\' 3"  (1.6 m)   Body mass index  is 37.48 kg/m. Ideal Body Weight: Weight in (lb) to have BMI = 25: 140.8  GEN: WDWN, NAD, Non-toxic, A & O x 3, obese, otherwise looks well HEENT: Atraumatic, Normocephalic. Neck supple. No masses, No LAD.  Bilateral TM wnl, oropharynx normal.  PEERL,EOMI.   Ears and Nose: No external deformity. CV: RRR, No M/G/R. No JVD. No thrill. No extra heart sounds. PULM: CTA B, no wheezes, crackles, rhonchi. No retractions. No resp. distress. No accessory muscle use. ABD: S, NT, ND. No rebound. No HSM. EXTR: No c/c/e NEURO Normal gait.  PSYCH: Normally interactive. Conversant. Not depressed or anxious appearing.  Calm demeanor.   We are out of flu swabs and cannot test her today Assessment and Plan: Flu-like symptoms - Plan: oseltamivir (TAMIFLU) 75 MG capsule, DISCONTINUED: oseltamivir (TAMIFLU) 75 MG capsule  Here today with likely flu- we are not able to test her today but her sx are history are typical rx for tamiflu Supportive care She will seek care if not getting better as expected   Signed Lamar Blinks, MD

## 2016-12-27 NOTE — Patient Instructions (Signed)
You likely have the flu Rest, drink plenty of fluids and use tylenol as needed.  OTC cough syrup is also ok Use the tamiflu as directed If you are not getting better- or if you are getting worse- please seek care!

## 2016-12-28 NOTE — Progress Notes (Signed)
Pre visit review using our clinic review tool, if applicable. No additional management support is needed unless otherwise documented below in the visit note. 

## 2016-12-28 NOTE — Progress Notes (Addendum)
Subjective:   Lori Joseph is a 76 y.o. female who presents for Medicare Annual (Subsequent) preventive examination.  Review of Systems:  No ROS.  Medicare Wellness Visit.  Cardiac Risk Factors include: advanced age (>19men, >2 women);hypertension;dyslipidemia;obesity (BMI >30kg/m2);sedentary lifestyle Sleep patterns: Takes Melatonin 5mg  to help sleep as needed. Sleeps 6-7 hrs per night. Feels rested. Home Safety/Smoke Alarms:  Feels safe in home. Smoke alarms in place.  Living environment; residence and Firearm Safety: Lives at home with husband in 1 story home. Guns safely stored. Seat Belt Safety/Bike Helmet: Wears seat belt.   Counseling:   Eye Exam-Wears reading glasses. Dr.Bond annually.  Dental- Dr.Duck every year.   Female:   Pap- Pt states last pap 12/21/16- With Marylynn Pearson. Will request document.       Mammo- Last 10/15/16: BI-RADS CATEGORY 3: Probably benign.      Dexa scan- last 07/02/12: Osteopenia.--- Pt states MD told her she does not need at this time.   CCS- Last 09/12/16: Polyp removed. Diverticulosis. Otherwise normal. Per report: repeat no recommended due to age.      Objective:     Vitals: BP 140/80 (BP Location: Right Arm, Patient Position: Sitting, Cuff Size: Large)   Pulse (!) 58   Resp (!) 98   Ht 5\' 3"  (1.6 m)   Wt 213 lb 3.2 oz (96.7 kg)   BMI 37.77 kg/m   Body mass index is 37.77 kg/m.   Tobacco History  Smoking Status  . Never Smoker  Smokeless Tobacco  . Never Used     Counseling given: No   Past Medical History:  Diagnosis Date  . Allergic rhinitis   . Anemia   . Cancer [facility])    breast -left  . Family history of anesthesia complication    daughter hard to wake up  . Glaucoma   . H/O acute pancreatitis   . H/O retinal detachment   . Hurthle cell neoplasm of thyroid    Right   . HYPERLIPIDEMIA   . Hypertension   . Hypothyroidism 05/31/2015  . OSTEOPENIA   . Primary cancer of upper outer quadrant of left female breast  ([place])    Dr. Lindi Adie  . Radiation 11/11/15-12/30/14   left breast 50.4 gray, lumpectomy cavity boosted to 62.4 gray   Past Surgical History:  Procedure Laterality Date  . BREAST BIOPSY Left 08/27/14  . CHOLECYSTECTOMY  2000  . DILATION AND CURETTAGE OF UTERUS    . laser eye surgery, detached retina Left   . RADIOACTIVE SEED GUIDED MASTECTOMY WITH AXILLARY SENTINEL LYMPH NODE BIOPSY Left 09/16/2014   Procedure: RADIOACTIVE SEED GUIDED PARTIAL MASTECTOMY WITH AXILLARY SENTINEL LYMPH NODE BIOPSY;  Surgeon: [name], MD;  Location: [facility] [facility];  Service: General;  Laterality: Left;  . thyoidectomy Left    partial  . THYROID LOBECTOMY N/A 02/10/2015   Procedure: RIGHT THYROID LOBECTOMY;  Surgeon: [name], MD;  [facility];  Service: General;  Laterality: N/A;  . TONSILLECTOMY    . TUBAL LIGATION     Family History  Problem Relation Age of Onset  . Breast cancer Sister 42  . Cancer Father     lung cancer ; smoker  . Cancer Brother     3 brothers with lung cancer, all smokers  . Cancer Paternal Uncle     2 pat uncles with lung cancer, smokers  . Kidney cancer Sister 26  . Melanoma Sister 49  . Ovarian cancer Other 85  niece with ovarian cancer (related through sister with breast cancer)  . Colon cancer Neg Hx   . CAD Neg Hx   . Esophageal cancer Neg Hx   . Stomach cancer Neg Hx   . Rectal cancer Neg Hx    History  Sexual Activity  . Sexual activity: Not on file    Outpatient Encounter Prescriptions as of 01/01/2017  Medication Sig  . anastrozole (ARIMIDEX) 1 MG tablet Take 1 tablet (1 mg total) by mouth daily.  Marland Kitchen aspirin 81 MG EC tablet Take 81 mg by mouth every morning.   Marland Kitchen augmented betamethasone dipropionate (DIPROLENE-AF) 0.05 % cream Apply topically 2 (two) times daily.  Marland Kitchen BENICAR 40 MG tablet Take 1 tablet (40 mg total) by mouth daily.  . calcium carbonate 1250 MG capsule Take 2,500 mg by mouth every morning.   . cholecalciferol (VITAMIN D)  1000 UNITS tablet Take 2,500 Units by mouth every morning.   . dorzolamide-timolol (COSOPT) 22.3-6.8 MG/ML ophthalmic solution Place 1 drop into both eyes every morning.   . fluticasone (CUTIVATE) 0.005 % ointment Apply 1 application topically 3 (three) times daily as needed.  . latanoprost (XALATAN) 0.005 % ophthalmic solution Place 1 drop into both eyes at bedtime.  Marland Kitchen levothyroxine (SYNTHROID, LEVOTHROID) 25 MCG tablet Take 1 tablet (25 mcg total) by mouth daily before breakfast.  . Multiple Vitamin (MULTIVITAMIN) tablet Take 1 tablet by mouth every morning.   . pravastatin (PRAVACHOL) 40 MG tablet Take 1 tablet (40 mg total) by mouth daily.  Marland Kitchen latanoprost (XALATAN) 0.005 % ophthalmic solution PLACE 1 DROP INTO BOTH EYES NIGHTLY.  . [DISCONTINUED] oseltamivir (TAMIFLU) 75 MG capsule Take 1 capsule (75 mg total) by mouth 2 (two) times daily.   Facility-Administered Encounter Medications as of 01/01/2017  Medication  . 0.9 %  sodium chloride infusion    Activities of Daily Living In your present state of health, do you have any difficulty performing the following activities: 01/01/2017 09/14/2016  Hearing? N N  Vision? N N  Difficulty concentrating or making decisions? N N  Walking or climbing stairs? N N  Dressing or bathing? N N  Doing errands, shopping? N N  Preparing Food and eating ? N -  Using the Toilet? N -  In the past six months, have you accidently leaked urine? N -  Do you have problems with loss of bowel control? N -  Managing your Medications? N -  Managing your Finances? N -  Housekeeping or managing your Housekeeping? N -  Some recent data might be hidden    Patient Care Team: Colon Branch, MD as PCP - General [name], MD as Consulting Physician (Hematology and Oncology) [name], MD as Consulting Physician (General Surgery) Marylynn Pearson, MD as Consulting Physician (Obstetrics and Gynecology) Irene Shipper, MD as Consulting Physician  (Gastroenterology) Gery Pray, MD as Consulting Physician (Radiation Oncology) Druscilla Brownie, MD as Consulting Physician (Dermatology)    Assessment:    Physical assessment deferred to PCP.  Exercise Activities and Dietary recommendations   Diet (meal preparation, eat out, water intake, caffeinated beverages, dairy products, fruits and vegetables): in general, a "healthy" diet  . Drinks water.   Goals    . Weight (lb) < 200 lb (90.7 kg)    . Will attend silver sneakers 2x/week      Fall Risk Fall Risk  01/01/2017 09/14/2016 07/10/2016 02/21/2016 11/23/2015  Falls in the past year? No No No No No  Number falls in past  yr: - - - - -  Injury with Fall? - - - - -  Risk for fall due to : - - - - -   Depression Screen PHQ 2/9 Scores 01/01/2017 09/14/2016 07/10/2016 02/21/2016  PHQ - 2 Score 0 0 0 0  Exception Documentation - - - -     Cognitive Function MMSE - Mini Mental State Exam 01/01/2017  Orientation to time 5  Orientation to Place 5  Registration 3  Attention/ Calculation 5  Recall 3  Language- name 2 objects 2  Language- repeat 0  Language- follow 3 step command 3  Language- read & follow direction 1  Write a sentence 1  Copy design 1  Total score 29        Immunization History  Administered Date(s) Administered  . Influenza Whole 10/06/2008, 09/28/2009, 08/22/2010  . Influenza, High Dose Seasonal PF 10/16/2013, 08/24/2015, 09/14/2016  . Influenza,inj,Quad PF,36+ Mos 08/05/2014  . Pneumococcal Conjugate-13 08/05/2014  . Pneumococcal Polysaccharide-23 10/15/2007  . Td 10/15/2007   Screening Tests Health Maintenance  Topic Date Due  . DEXA SCAN  07/02/2014  . TETANUS/TDAP  10/14/2017  . COLONOSCOPY  09/12/2026  . INFLUENZA VACCINE  Completed  . PNA vac Low Risk Adult  Completed      Plan:     Continue to eat heart healthy diet (full of fruits, vegetables, whole grains, lean protein, water--limit salt, fat, and sugar intake) and increase physical  activity as tolerated.   During the course of the visit the patient was educated and counseled about the following appropriate screening and preventive services:   Vaccines to include Pneumoccal, Influenza, Hepatitis B, Td, Zostavax, HCV-UTD  Cardiovascular Disease  Colorectal cancer screening-UTD  Bone density screening-pt declines today  Diabetes screening  Glaucoma screening  Mammography/PAP-UTD  Nutrition counseling   Patient Instructions (the written plan) was given to the patient.   Naaman Plummer [place], [place]  01/01/2017  Kathlene November, MD

## 2016-12-29 ENCOUNTER — Other Ambulatory Visit: Payer: Self-pay | Admitting: Hematology and Oncology

## 2017-01-01 ENCOUNTER — Ambulatory Visit (INDEPENDENT_AMBULATORY_CARE_PROVIDER_SITE_OTHER): Payer: Medicare Other | Admitting: *Deleted

## 2017-01-01 ENCOUNTER — Encounter: Payer: Self-pay | Admitting: *Deleted

## 2017-01-01 VITALS — BP 140/80 | HR 58 | Resp 98 | Ht 63.0 in | Wt 213.2 lb

## 2017-01-01 DIAGNOSIS — Z0000 Encounter for general adult medical examination without abnormal findings: Secondary | ICD-10-CM

## 2017-01-01 NOTE — Patient Instructions (Addendum)
Continue to eat heart healthy diet (full of fruits, vegetables, whole grains, lean protein, water--limit salt, fat, and sugar intake) and increase physical activity as tolerated.  Health Maintenance, Female Introduction Adopting a healthy lifestyle and getting preventive care can go a long way to promote health and wellness. Talk with your health care provider about what schedule of regular examinations is right for you. This is a good chance for you to check in with your provider about disease prevention and staying healthy. In between checkups, there are plenty of things you can do on your own. Experts have done a lot of research about which lifestyle changes and preventive measures are most likely to keep you healthy. Ask your health care provider for more information. Weight and diet Eat a healthy diet  Be sure to include plenty of vegetables, fruits, low-fat dairy products, and lean protein.  Do not eat a lot of foods high in solid fats, added sugars, or salt.  Get regular exercise. This is one of the most important things you can do for your health.  Most adults should exercise for at least 150 minutes each week. The exercise should increase your heart rate and make you sweat (moderate-intensity exercise).  Most adults should also do strengthening exercises at least twice a week. This is in addition to the moderate-intensity exercise. Maintain a healthy weight  Body mass index (BMI) is a measurement that can be used to identify possible weight problems. It estimates body fat based on height and weight. Your health care provider can help determine your BMI and help you achieve or maintain a healthy weight.  For females 70 years of age and older:  A BMI below 18.5 is considered underweight.  A BMI of 18.5 to 24.9 is normal.  A BMI of 25 to 29.9 is considered overweight.  A BMI of 30 and above is considered obese. Watch levels of cholesterol and blood lipids  You should  start having your blood tested for lipids and cholesterol at 76 years of age, then have this test every 5 years.  You may need to have your cholesterol levels checked more often if:  Your lipid or cholesterol levels are high.  You are older than 76 years of age.  You are at high risk for heart disease. Cancer screening Lung Cancer  Lung cancer screening is recommended for adults 63-37 years old who are at high risk for lung cancer because of a history of smoking.  A yearly low-dose CT scan of the lungs is recommended for people who:  Currently smoke.  Have quit within the past 15 years.  Have at least a 30-pack-year history of smoking. A pack year is smoking an average of one pack of cigarettes a day for 1 year.  Yearly screening should continue until it has been 15 years since you quit.  Yearly screening should stop if you develop a health problem that would prevent you from having lung cancer treatment. Breast Cancer  Practice breast self-awareness. This means understanding how your breasts normally appear and feel.  It also means doing regular breast self-exams. Let your health care provider know about any changes, no matter how small.  If you are in your 20s or 30s, you should have a clinical breast exam (CBE) by a health care provider every 1-3 years as part of a regular health exam.  If you are 78 or older, have a CBE every year. Also consider having a breast X-ray (mammogram) every  year.  If you have a family history of breast cancer, talk to your health care provider about genetic screening.  If you are at high risk for breast cancer, talk to your health care provider about having an MRI and a mammogram every year.  Breast cancer gene (BRCA) assessment is recommended for women who have family members with BRCA-related cancers. BRCA-related cancers include:  Breast.  Ovarian.  Tubal.  Peritoneal cancers.  Results of the assessment will determine the need for  genetic counseling and BRCA1 and BRCA2 testing. Cervical Cancer  Your health care provider may recommend that you be screened regularly for cancer of the pelvic organs (ovaries, uterus, and vagina). This screening involves a pelvic examination, including checking for microscopic changes to the surface of your cervix (Pap test). You may be encouraged to have this screening done every 3 years, beginning at age 54.  For women ages 63-65, health care providers may recommend pelvic exams and Pap testing every 3 years, or they may recommend the Pap and pelvic exam, combined with testing for human papilloma virus (HPV), every 5 years. Some types of HPV increase your risk of cervical cancer. Testing for HPV may also be done on women of any age with unclear Pap test results.  Other health care providers may not recommend any screening for nonpregnant women who are considered low risk for pelvic cancer and who do not have symptoms. Ask your health care provider if a screening pelvic exam is right for you.  If you have had past treatment for cervical cancer or a condition that could lead to cancer, you need Pap tests and screening for cancer for at least 20 years after your treatment. If Pap tests have been discontinued, your risk factors (such as having a new sexual partner) need to be reassessed to determine if screening should resume. Some women have medical problems that increase the chance of getting cervical cancer. In these cases, your health care provider may recommend more frequent screening and Pap tests. Colorectal Cancer  This type of cancer can be detected and often prevented.  Routine colorectal cancer screening usually begins at 76 years of age and continues through 76 years of age.  Your health care provider may recommend screening at an earlier age if you have risk factors for colon cancer.  Your health care provider may also recommend using home test kits to check for hidden blood in the  stool.  A small camera at the end of a tube can be used to examine your colon directly (sigmoidoscopy or colonoscopy). This is done to check for the earliest forms of colorectal cancer.  Routine screening usually begins at age 106.  Direct examination of the colon should be repeated every 5-10 years through 76 years of age. However, you may need to be screened more often if early forms of precancerous polyps or small growths are found. Skin Cancer  Check your skin from head to toe regularly.  Tell your health care provider about any new moles or changes in moles, especially if there is a change in a mole's shape or color.  Also tell your health care provider if you have a mole that is larger than the size of a pencil eraser.  Always use sunscreen. Apply sunscreen liberally and repeatedly throughout the day.  Protect yourself by wearing long sleeves, pants, a wide-brimmed hat, and sunglasses whenever you are outside. Heart disease, diabetes, and high blood pressure  High blood pressure causes heart disease and increases  the risk of stroke. High blood pressure is more likely to develop in:  People who have blood pressure in the high end of the normal range (130-139/85-89 mm Hg).  People who are overweight or obese.  People who are African American.  If you are 60-10 years of age, have your blood pressure checked every 3-5 years. If you are 19 years of age or older, have your blood pressure checked every year. You should have your blood pressure measured twice-once when you are at a hospital or clinic, and once when you are not at a hospital or clinic. Record the average of the two measurements. To check your blood pressure when you are not at a hospital or clinic, you can use:  An automated blood pressure machine at a pharmacy.  A home blood pressure monitor.  If you are between 46 years and 49 years old, ask your health care provider if you should take aspirin to prevent  strokes.  Have regular diabetes screenings. This involves taking a blood sample to check your fasting blood sugar level.  If you are at a normal weight and have a low risk for diabetes, have this test once every three years after 76 years of age.  If you are overweight and have a high risk for diabetes, consider being tested at a younger age or more often. Preventing infection Hepatitis B  If you have a higher risk for hepatitis B, you should be screened for this virus. You are considered at high risk for hepatitis B if:  You were born in a country where hepatitis B is common. Ask your health care provider which countries are considered high risk.  Your parents were born in a high-risk country, and you have not been immunized against hepatitis B (hepatitis B vaccine).  You have HIV or AIDS.  You use needles to inject street drugs.  You live with someone who has hepatitis B.  You have had sex with someone who has hepatitis B.  You get hemodialysis treatment.  You take certain medicines for conditions, including cancer, organ transplantation, and autoimmune conditions. Hepatitis C  Blood testing is recommended for:  Everyone born from [birth year] through 1965.  Anyone with known risk factors for hepatitis C. Sexually transmitted infections (STIs)  You should be screened for sexually transmitted infections (STIs) including gonorrhea and chlamydia if:  You are sexually active and are younger than 76 years of age.  You are older than 76 years of age and your health care provider tells you that you are at risk for this type of infection.  Your sexual activity has changed since you were last screened and you are at an increased risk for chlamydia or gonorrhea. Ask your health care provider if you are at risk.  If you do not have HIV, but are at risk, it may be recommended that you take a prescription medicine daily to prevent HIV infection. This is called pre-exposure prophylaxis  (PrEP). You are considered at risk if:  You are sexually active and do not regularly use condoms or know the HIV status of your partner(s).  You take drugs by injection.  You are sexually active with a partner who has HIV. Talk with your health care provider about whether you are at high risk of being infected with HIV. If you choose to begin PrEP, you should first be tested for HIV. You should then be tested every 3 months for as long as you are taking PrEP. Pregnancy  If  you are premenopausal and you may become pregnant, ask your health care provider about preconception counseling.  If you may become pregnant, take 400 to 800 micrograms (mcg) of folic acid every day.  If you want to prevent pregnancy, talk to your health care provider about birth control (contraception). Osteoporosis and menopause  Osteoporosis is a disease in which the bones lose minerals and strength with aging. This can result in serious bone fractures. Your risk for osteoporosis can be identified using a bone density scan.  If you are 67 years of age or older, or if you are at risk for osteoporosis and fractures, ask your health care provider if you should be screened.  Ask your health care provider whether you should take a calcium or vitamin D supplement to lower your risk for osteoporosis.  Menopause may have certain physical symptoms and risks.  Hormone replacement therapy may reduce some of these symptoms and risks. Talk to your health care provider about whether hormone replacement therapy is right for you. Follow these instructions at home:  Schedule regular health, dental, and eye exams.  Stay current with your immunizations.  Do not use any tobacco products including cigarettes, chewing tobacco, or electronic cigarettes.  If you are pregnant, do not drink alcohol.  If you are breastfeeding, limit how much and how often you drink alcohol.  Limit alcohol intake to no more than 1 drink per day for  nonpregnant women. One drink equals 12 ounces of beer, 5 ounces of wine, or 1 ounces of hard liquor.  Do not use street drugs.  Do not share needles.  Ask your health care provider for help if you need support or information about quitting drugs.  Tell your health care provider if you often feel depressed.  Tell your health care provider if you have ever been abused or do not feel safe at home. This information is not intended to replace advice given to you by your health care provider. Make sure you discuss any questions you have with your health care provider. Document Released: 05/28/2011 Document Revised: 04/19/2016 Document Reviewed: 08/16/2015  2017 [employer]vier

## 2017-01-09 DIAGNOSIS — N870 Mild cervical dysplasia: Secondary | ICD-10-CM | POA: Diagnosis not present

## 2017-01-09 DIAGNOSIS — R87610 Atypical squamous cells of undetermined significance on cytologic smear of cervix (ASC-US): Secondary | ICD-10-CM | POA: Diagnosis not present

## 2017-01-11 DIAGNOSIS — H40023 Open angle with borderline findings, high risk, bilateral: Secondary | ICD-10-CM | POA: Diagnosis not present

## 2017-02-06 DIAGNOSIS — H40023 Open angle with borderline findings, high risk, bilateral: Secondary | ICD-10-CM | POA: Diagnosis not present

## 2017-03-18 ENCOUNTER — Other Ambulatory Visit: Payer: Self-pay

## 2017-03-18 MED ORDER — BENICAR 40 MG PO TABS: 40.0000 mg | ORAL_TABLET | Freq: Every day | ORAL | 1 refills | Status: DC

## 2017-03-20 ENCOUNTER — Other Ambulatory Visit: Payer: Self-pay | Admitting: Internal Medicine

## 2017-03-26 DIAGNOSIS — I4891 Unspecified atrial fibrillation: Secondary | ICD-10-CM

## 2017-03-26 DIAGNOSIS — I480 Paroxysmal atrial fibrillation: Secondary | ICD-10-CM

## 2017-03-26 HISTORY — DX: Unspecified atrial fibrillation: I48.91

## 2017-03-26 HISTORY — DX: Paroxysmal atrial fibrillation: I48.0

## 2017-04-16 ENCOUNTER — Other Ambulatory Visit: Payer: Self-pay | Admitting: Internal Medicine

## 2017-04-16 DIAGNOSIS — R921 Mammographic calcification found on diagnostic imaging of breast: Secondary | ICD-10-CM

## 2017-04-18 DIAGNOSIS — C50412 Malignant neoplasm of upper-outer quadrant of left female breast: Secondary | ICD-10-CM | POA: Diagnosis not present

## 2017-04-25 ENCOUNTER — Ambulatory Visit (INDEPENDENT_AMBULATORY_CARE_PROVIDER_SITE_OTHER): Payer: Medicare Other | Admitting: Internal Medicine

## 2017-04-25 ENCOUNTER — Encounter: Payer: Self-pay | Admitting: Internal Medicine

## 2017-04-25 VITALS — BP 134/82 | HR 57 | Temp 98.2°F | Resp 14 | Ht 63.0 in | Wt 217.2 lb

## 2017-04-25 DIAGNOSIS — R399 Unspecified symptoms and signs involving the genitourinary system: Secondary | ICD-10-CM

## 2017-04-25 DIAGNOSIS — R5383 Other fatigue: Secondary | ICD-10-CM | POA: Diagnosis not present

## 2017-04-25 DIAGNOSIS — I4891 Unspecified atrial fibrillation: Secondary | ICD-10-CM

## 2017-04-25 DIAGNOSIS — R739 Hyperglycemia, unspecified: Secondary | ICD-10-CM | POA: Diagnosis not present

## 2017-04-25 LAB — URINALYSIS, ROUTINE W REFLEX MICROSCOPIC
Bilirubin Urine: NEGATIVE
HGB URINE DIPSTICK: NEGATIVE
Ketones, ur: NEGATIVE
NITRITE: NEGATIVE
RBC / HPF: NONE SEEN (ref 0–?)
Specific Gravity, Urine: 1.02 (ref 1.000–1.030)
Total Protein, Urine: NEGATIVE
Urine Glucose: NEGATIVE
Urobilinogen, UA: 0.2 (ref 0.0–1.0)
pH: 6 (ref 5.0–8.0)

## 2017-04-25 LAB — CBC WITH DIFFERENTIAL/PLATELET
BASOS ABS: 0 10*3/uL (ref 0.0–0.1)
Basophils Relative: 0.5 % (ref 0.0–3.0)
EOS PCT: 1.9 % (ref 0.0–5.0)
Eosinophils Absolute: 0.1 10*3/uL (ref 0.0–0.7)
HCT: 39.7 % (ref 36.0–46.0)
Hemoglobin: 13.3 g/dL (ref 12.0–15.0)
LYMPHS ABS: 1.7 10*3/uL (ref 0.7–4.0)
Lymphocytes Relative: 31.1 % (ref 12.0–46.0)
MCHC: 33.6 g/dL (ref 30.0–36.0)
MCV: 94.7 fl (ref 78.0–100.0)
MONO ABS: 0.4 10*3/uL (ref 0.1–1.0)
Monocytes Relative: 7.1 % (ref 3.0–12.0)
NEUTROS PCT: 59.4 % (ref 43.0–77.0)
Neutro Abs: 3.2 10*3/uL (ref 1.4–7.7)
Platelets: 189 10*3/uL (ref 150.0–400.0)
RBC: 4.19 Mil/uL (ref 3.87–5.11)
RDW: 14 % (ref 11.5–15.5)
WBC: 5.3 10*3/uL (ref 4.0–10.5)

## 2017-04-25 LAB — POC URINALSYSI DIPSTICK (AUTOMATED)
Bilirubin, UA: NEGATIVE
Blood, UA: NEGATIVE
GLUCOSE UA: NEGATIVE
KETONES UA: NEGATIVE
Nitrite, UA: NEGATIVE
Protein, UA: NEGATIVE
Urobilinogen, UA: 0.2 E.U./dL
pH, UA: 6 (ref 5.0–8.0)

## 2017-04-25 LAB — HEMOGLOBIN A1C: HEMOGLOBIN A1C: 5.8 % (ref 4.6–6.5)

## 2017-04-25 LAB — COMPREHENSIVE METABOLIC PANEL
ALK PHOS: 86 U/L (ref 39–117)
ALT: 15 U/L (ref 0–35)
AST: 16 U/L (ref 0–37)
Albumin: 4 g/dL (ref 3.5–5.2)
BUN: 26 mg/dL — AB (ref 6–23)
CO2: 28 mEq/L (ref 19–32)
CREATININE: 0.93 mg/dL (ref 0.40–1.20)
Calcium: 9.8 mg/dL (ref 8.4–10.5)
Chloride: 107 mEq/L (ref 96–112)
GFR: 62.3 mL/min (ref >60.00)
GLUCOSE: 94 mg/dL (ref 70–99)
Potassium: 4.8 mEq/L (ref 3.5–5.1)
Sodium: 140 mEq/L (ref 135–145)
TOTAL PROTEIN: 6.5 g/dL (ref 6.0–8.3)
Total Bilirubin: 0.5 mg/dL (ref 0.2–1.2)

## 2017-04-25 LAB — TSH: TSH: 2.95 u[IU]/mL (ref 0.35–4.50)

## 2017-04-25 MED ORDER — METOPROLOL TARTRATE 25 MG PO TABS: 25.0000 mg | ORAL_TABLET | Freq: Two times a day (BID) | ORAL | 3 refills | Status: DC

## 2017-04-25 MED ORDER — RIVAROXABAN 20 MG PO TABS: 20.0000 mg | ORAL_TABLET | Freq: Every day | ORAL | 3 refills | Status: DC

## 2017-04-25 NOTE — Progress Notes (Signed)
Pre visit review using our clinic review tool, if applicable. No additional management support is needed unless otherwise documented below in the visit note. 

## 2017-04-25 NOTE — Progress Notes (Signed)
Subjective:    Patient ID: Lori Joseph, female    DOB: 07/20/1941, 76 y.o.   MRN: [id number]  DOS:  04/25/2017 Type of visit - description : acute Interval history:  Patient here for a UTI however her chief complaint is fatigue. Reports that for the last 3 days she is profoundly fatigue, tired, "almost to the point I'm going to pass out". She denies dizziness per se. She thought that the above symptoms could be due to to a UTI however she denies dysuria or gross hematuria. She did say that she can't  Empty her bladder completely.  Her heart rate was noted to be irregular.  Review of Systems Denies fever chills No dyspnea on exertion. No chest pain or palpitations  No unusual myalgias or arthralgias. No diplopia although sometimes her vision gets "dark". Denies slurred speech, face numbness, motor deficits or headaches. No nausea, vomiting or blood in the stools Not taking any new medications No recent ambulatory BPs.  Past Medical History:  Diagnosis Date  . Allergic rhinitis   . Anemia   . Atrial fibrillation ([place]) 03/2017  . Family history of anesthesia complication    daughter hard to wake up  . Glaucoma   . H/O acute pancreatitis   . H/O retinal detachment   . Hurthle cell neoplasm of thyroid    Right   . HYPERLIPIDEMIA   . Hypertension   . Hypothyroidism 05/31/2015  . OSTEOPENIA   . Primary cancer of upper outer quadrant of left female breast ([place])    Dr. Lindi Adie  . Radiation 11/11/15-12/30/14   left breast 50.4 gray, lumpectomy cavity boosted to 62.4 gray    Past Surgical History:  Procedure Laterality Date  . BREAST BIOPSY Left 08/27/14  . CHOLECYSTECTOMY  2000  . DILATION AND CURETTAGE OF UTERUS    . laser eye surgery, detached retina Left   . RADIOACTIVE SEED GUIDED MASTECTOMY WITH AXILLARY SENTINEL LYMPH NODE BIOPSY Left 09/16/2014   Procedure: RADIOACTIVE SEED GUIDED PARTIAL MASTECTOMY WITH AXILLARY SENTINEL LYMPH NODE BIOPSY;  Surgeon: [name], MD;   Location: [place];  Service: General;  Laterality: Left;  . thyoidectomy Left    partial  . THYROID LOBECTOMY N/A 02/10/2015   Procedure: RIGHT THYROID LOBECTOMY;  Surgeon: [name], MD;  Location: [facility];  Service: General;  Laterality: N/A;  . TONSILLECTOMY    . TUBAL LIGATION      Social History   Social History  . Marital status: Married    Spouse name: Ruthann Cancer  . Number of children: 2  . Years of education: N/A   Occupational History  . 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 Smoking status: Never Smoker  . Smokeless tobacco: Never Used  . Alcohol use No  . Drug use: No  . Sexual activity: Not on file   Other Topics Concern  . Not on file   Social History Narrative   Lives w/ husband      Allergies as of 04/25/2017      Reactions   Codeine Anaphylaxis   Hyaluronic Acid  [collagen-chond-hyaluronic Acid] Itching, Rash   Carvedilol    REACTION: leg pain, edema   Felodipine    REACTION: HA, insomnia   Lisinopril    REACTION: HA, SWELLING   Losartan Potassium    REACTION: cough   Radiaplexrx [pyridoxine-zinc Picolinate] Itching, Rash      Medication List  Accurate as of 04/25/17 11:59 PM. Always use your most recent med list.          anastrozole 1 MG tablet Commonly known as:  ARIMIDEX Take 1 tablet (1 mg total) by mouth daily.   augmented betamethasone dipropionate 0.05 % cream Commonly known as:  DIPROLENE-AF Apply topically 2 (two) times daily.   BENICAR 40 MG tablet Generic drug:  olmesartan Take 1 tablet (40 mg total) by mouth daily.   calcium carbonate 1250 MG capsule Take 2,500 mg by mouth every morning.   cholecalciferol 1000 units tablet Commonly known as:  VITAMIN D Take 2,500 Units by mouth every morning.   dorzolamide-timolol 22.3-6.8 MG/ML ophthalmic solution Commonly known as:  COSOPT Place 1 drop into both eyes every morning.   fluticasone 0.005 %  ointment Commonly known as:  CUTIVATE Apply 1 application topically 3 (three) times daily as needed.   latanoprost 0.005 % ophthalmic solution Commonly known as:  XALATAN PLACE 1 DROP INTO BOTH EYES NIGHTLY.   levothyroxine 25 MCG tablet Commonly known as:  SYNTHROID, LEVOTHROID Take 1 tablet (25 mcg total) by mouth daily before breakfast.   metoprolol tartrate 25 MG tablet Commonly known as:  LOPRESSOR Take 1 tablet (25 mg total) by mouth 2 (two) times daily.   multivitamin tablet Take 1 tablet by mouth every morning.   pravastatin 40 MG tablet Commonly known as:  PRAVACHOL Take 1 tablet (40 mg total) by mouth daily.   rivaroxaban 20 MG Tabs tablet Commonly known as:  XARELTO Take 1 tablet (20 mg total) by mouth daily with supper.          Objective:   Physical Exam BP 134/82 (BP Location: Right Arm, Patient Position: Sitting, Cuff Size: Normal)   Pulse (!) 57   Temp 98.2 F (36.8 C) (Oral)   Resp 14   Ht 5\' 3"  (1.6 m)   Wt 217 lb 4 oz (98.5 kg)   SpO2 97%   BMI 38.48 kg/m   General:   Well developed, well nourished . NAD.  Neck: No JVD at 45  HEENT:  Normocephalic . Face symmetric, atraumatic Lungs:  CTA B Normal respiratory effort, no intercostal retractions, no accessory muscle use. Heart: Seems irregular,  no murmur.  No pretibial edema bilaterally  Abdomen:  Not distended, soft, non-tender. No rebound or rigidity.   Skin: Exposed areas without rash. Not pale. Not jaundice Neurologic:  alert & oriented X3.  Speech normal, gait appropriate for age and unassisted Strength symmetric and appropriate for age. EOMI, face symmetric. Psych: Cognition and judgment appear intact.  Cooperative with normal attention span and concentration.  Behavior appropriate. No anxious or depressed appearing.    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 CXR 03/27/2016: No acute. Pulmonary: f/u prn ++ FH Lung Ca (pt is not a smoker)  PLAN: New onset of A Fib: D/w  cardiology, patient is clinically stable. Will get appropriate labs, start Xarelto 20 mg daily and metoprolol 25 mg twice a day. Cardiology will call her soon for a visit. Will also schedule echo. What is a fibrillation, what to expect, risks of bleeding with Xarelto discussed with the patient. ER if problems. Patient is actually knowledgeable already regards atrial fib. Labs: CMP, CBC, TSH, A1c (hyperglycemia) Also will hold aspirin UTI: Urine dip positive, will send a UA urine culture RTC 3 months

## 2017-04-25 NOTE — Patient Instructions (Signed)
GO TO THE LAB : Get the blood work     GO TO THE FRONT DESK Schedule your next appointment for a  checkup with me in 3 months    Cardiology will call you for an office visit. They also Mona schedule an echocardiogram  Hold aspirin  Start Xarelto 20 mg one tablet. This increase your risk of bleeding, if you have any unusual or  severe bleeding either call or go to the ER  Start metoprolol 25 mg twice a day to slow down your heart  Check your blood pressure daily, if he is less than 110 please let us know.  Go to the ER if: Chest pain, difficulty breathing, lower extremity swelling, feeling worse.

## 2017-04-26 DIAGNOSIS — I4891 Unspecified atrial fibrillation: Secondary | ICD-10-CM | POA: Insufficient documentation

## 2017-04-26 LAB — URINE CULTURE

## 2017-04-26 NOTE — Assessment & Plan Note (Signed)
New onset of A Fib: D/w cardiology, patient is clinically stable. Will get appropriate labs, start Xarelto 20 mg daily and metoprolol 25 mg twice a day. Cardiology will call her soon for a visit. Will also schedule echo. What is a fibrillation, what to expect, risks of bleeding with Xarelto discussed with the patient. ER if problems. Patient is actually knowledgeable already regards atrial fib. Labs: CMP, CBC, TSH, A1c (hyperglycemia) Also will hold aspirin UTI: Urine dip positive, will send a UA urine culture RTC 3 months

## 2017-04-29 ENCOUNTER — Telehealth: Payer: Self-pay | Admitting: *Deleted

## 2017-04-29 NOTE — Telephone Encounter (Signed)
Pt notified of instructions and verbalized understanding. States she checked BP again since last call and it was 125/72.

## 2017-04-29 NOTE — Telephone Encounter (Signed)
Recommend to continue checking BPs, if she has persistent BPs above 140s/80s let us know. Also definitely call if she has persistent difficulty breathing.

## 2017-04-29 NOTE — Telephone Encounter (Signed)
Spoke w pt, she denies bleeding, CP, palpitations.  Last night, had 2 transient episodes of SOB that resolved spontaneously. Currently, breathing is at baseline. She checked her BP when this happened and it was 186/82. She tried to calm herself down, and then BP came down some.  Today, BP is 146/82, which she reports is still higher than her baseline. She is taking metoprolol and Xarelto as directed. She has echo scheduled for 6/13 and cards appt 6/20.  Please advise?

## 2017-05-08 ENCOUNTER — Ambulatory Visit (HOSPITAL_COMMUNITY): Payer: Medicare Other | Attending: Cardiology

## 2017-05-08 ENCOUNTER — Other Ambulatory Visit: Payer: Self-pay

## 2017-05-08 DIAGNOSIS — I700 Atherosclerosis of aorta: Secondary | ICD-10-CM | POA: Insufficient documentation

## 2017-05-08 DIAGNOSIS — I4891 Unspecified atrial fibrillation: Secondary | ICD-10-CM | POA: Diagnosis not present

## 2017-05-08 DIAGNOSIS — I081 Rheumatic disorders of both mitral and tricuspid valves: Secondary | ICD-10-CM | POA: Insufficient documentation

## 2017-05-12 NOTE — Progress Notes (Signed)
Cardiology Office Note   Date:  05/12/2017   ID:  AMILEY SHISHIDO, DOB [birth year], MRN [id number]  PCP:  Colon Branch, MD  Cardiologist:   Jenkins Rouge, MD   No chief complaint on file.     History of Present Illness: Lori Joseph is a 76 y.o. female who presents for consultation by Dr Larose Kells for new onset atrial fibrillation.  Seen by Dr Larose Kells 04/25/17 for UTI and fatigue.  So tired I might pass out.  She was started on metoprolol and xaretlto  Echo ordered and reviewed EF 55-60% mild LAE trivial MR She has hypothyroidism TSH noral 04/25/17 2.95 CRF;s include HTN and elevated lipids.    This patients CHA2DS2-VASc Score and unadjusted Ischemic Stroke Rate (% per year) is equal to 4.8 % stroke rate/year from a score of 4  Above score calculated as 1 point each if present [CHF, HTN, DM, Vascular=MI/PAD/Aortic Plaque, Age if 65-74, or Female] Above score calculated as 2 points each if present [Age > 75, or Stroke/TIA/TE]  Must have converted on her own as she feels fine now    Past Medical History:  Diagnosis Date  . Allergic rhinitis   . Anemia   . Atrial fibrillation ([place]) 03/2017  . Family history of anesthesia complication    daughter hard to wake up  . Glaucoma   . H/O acute pancreatitis   . H/O retinal detachment   . Hurthle cell neoplasm of thyroid    Right   . HYPERLIPIDEMIA   . Hypertension   . Hypothyroidism 05/31/2015  . OSTEOPENIA   . Primary cancer of upper outer quadrant of left female breast ([place])    Dr. Lindi Adie  . Radiation 11/11/15-12/30/14   left breast 50.4 gray, lumpectomy cavity boosted to 62.4 gray    Past Surgical History:  Procedure Laterality Date  . BREAST BIOPSY Left 08/27/14  . CHOLECYSTECTOMY  2000  . DILATION AND CURETTAGE OF UTERUS    . laser eye surgery, detached retina Left   . RADIOACTIVE SEED GUIDED MASTECTOMY WITH AXILLARY SENTINEL LYMPH NODE BIOPSY Left 09/16/2014   Procedure: RADIOACTIVE SEED GUIDED PARTIAL MASTECTOMY WITH AXILLARY SENTINEL  LYMPH NODE BIOPSY;  Surgeon: [name], MD;  Location: [facility];  Service: General;  Laterality: Left;  . thyoidectomy Left    partial  . THYROID LOBECTOMY N/A 02/10/2015   Procedure: RIGHT THYROID LOBECTOMY;  Surgeon: [name], MD;  Location: [facility];  Service: General;  Laterality: N/A;  . TONSILLECTOMY    . TUBAL LIGATION       Current Outpatient Prescriptions  Medication Sig Dispense Refill  . anastrozole (ARIMIDEX) 1 MG tablet Take 1 tablet (1 mg total) by mouth daily. 90 tablet 3  . augmented betamethasone dipropionate (DIPROLENE-AF) 0.05 % cream Apply topically 2 (two) times daily. 60 g 0  . BENICAR 40 MG tablet Take 1 tablet (40 mg total) by mouth daily. 90 tablet 1  . calcium carbonate 1250 MG capsule Take 2,500 mg by mouth every morning.     . cholecalciferol (VITAMIN D) 1000 UNITS tablet Take 2,500 Units by mouth every morning.     . dorzolamide-timolol (COSOPT) 22.3-6.8 MG/ML ophthalmic solution Place 1 drop into both eyes every morning.     . fluticasone (CUTIVATE) 0.005 % ointment Apply 1 application topically 3 (three) times daily as needed. 60 g 3  . latanoprost (XALATAN) 0.005 % ophthalmic solution PLACE 1 DROP INTO BOTH EYES NIGHTLY.    Marland Kitchen  levothyroxine (SYNTHROID, LEVOTHROID) 25 MCG tablet Take 1 tablet (25 mcg total) by mouth daily before breakfast. 90 tablet 0  . metoprolol tartrate (LOPRESSOR) 25 MG tablet Take 1 tablet (25 mg total) by mouth 2 (two) times daily. 60 tablet 3  . Multiple Vitamin (MULTIVITAMIN) tablet Take 1 tablet by mouth every morning.     . pravastatin (PRAVACHOL) 40 MG tablet Take 1 tablet (40 mg total) by mouth daily. 90 tablet 0  . rivaroxaban (XARELTO) 20 MG TABS tablet Take 1 tablet (20 mg total) by mouth daily with supper. 30 tablet 3   Current Facility-Administered Medications  Medication Dose Route Frequency Provider Last Rate Last Dose  . 0.9 %  sodium chloride infusion  500 mL Intravenous Continuous Lori Shipper, MD         Allergies:   Codeine; Hyaluronic acid  [collagen-chond-hyaluronic acid]; Carvedilol; Felodipine; Lisinopril; Losartan potassium; and Radiaplexrx [pyridoxine-zinc picolinate]    Social History:  The patient  reports that she has never smoked. She has never used smokeless tobacco. She reports that she does not drink alcohol or use drugs.   Family History:  The patient's family history includes Breast cancer (age of onset: 12) in her sister; Cancer in her brother, father, and paternal uncle; Kidney cancer (age of onset: 20) in her sister; Melanoma (age of onset: 53) in her sister; Ovarian cancer (age of onset: 56) in her other.    ROS:  Please see the history of present illness.   Otherwise, review of systems are positive for none.   All other systems are reviewed and negative.    PHYSICAL EXAM: VS:  There were no vitals taken for this visit. , BMI There is no height or weight on file to calculate BMI. Affect appropriate Overweight looks younger than stated age  50: normal Neck supple with no adenopathy JVP normal no bruits no thyromegaly Lungs clear with no wheezing and good diaphragmatic motion Heart:  S1/S2 no murmur, no rub, gallop or click PMI normal Abdomen: benighn, BS positve, no tenderness, no AAA no bruit.  No HSM or HJR Distal pulses intact with no bruits Plus one edema varicosities right greater than left  Neuro non-focal Skin warm and dry No muscular weakness    EKG:  afib rate 119 otherwise normal  05/15/17  SR rate 54 flat ST segments inferior lateral    Recent Labs: 04/25/2017: ALT 15; BUN 26; Creatinine, Ser 0.93; Hemoglobin 13.3; Platelets 189.0; Potassium 4.8; Sodium 140; TSH 2.95    Lipid Panel    Component Value Date/Time   CHOL 160 07/10/2016 0926   TRIG 114.0 07/10/2016 0926   HDL 57.70 07/10/2016 0926   CHOLHDL 3 07/10/2016 0926   VLDL 22.8 07/10/2016 0926   LDLCALC 80 07/10/2016 0926   LDLDIRECT 177.2 08/04/2013 1147      Wt Readings  from Last 3 Encounters:  04/25/17 98.5 kg (217 lb 4 oz)  01/01/17 96.7 kg (213 lb 3.2 oz)  12/27/16 96 kg (211 lb 9.6 oz)      Other studies Reviewed: Additional studies/ records that were reviewed today include: Notes DR Larose Kells labs and echo see HPI .    ASSESSMENT AND PLAN:  1.  Atrial Fibrillation continue xarelto and lopressor myovue to r/o ischemia echo ok non valvular PAF  2. HTN: Well controlled.  Continue current medications and low sodium Dash type diet.   3. HL: continue statin labs with Dr Larose Kells 4. Thyroid on replacement TSH normal  5. Fatigue  improved with conversion to NSR likely from rapid rate    Current medicines are reviewed at length with the patient today.  The patient does not have concerns regarding medicines.  The following changes have been made:  no change  Labs/ tests ordered today include: Ex Myovue  No orders of the defined types were placed in this encounter.    Disposition:   FU with me in 6 months      Signed, Jenkins Rouge, MD  05/12/2017 3:10 PM    [facility] [address], [place], [place]  [address] Phone: [phone]; Fax: [phone]

## 2017-05-14 DIAGNOSIS — M25551 Pain in right hip: Secondary | ICD-10-CM | POA: Diagnosis not present

## 2017-05-15 ENCOUNTER — Ambulatory Visit (INDEPENDENT_AMBULATORY_CARE_PROVIDER_SITE_OTHER): Payer: Medicare Other | Admitting: Cardiovascular Disease

## 2017-05-15 ENCOUNTER — Encounter: Payer: Self-pay | Admitting: Cardiovascular Disease

## 2017-05-15 ENCOUNTER — Encounter (INDEPENDENT_AMBULATORY_CARE_PROVIDER_SITE_OTHER): Payer: Self-pay

## 2017-05-15 VITALS — BP 132/88 | HR 54 | Ht 63.0 in | Wt 219.0 lb

## 2017-05-15 DIAGNOSIS — I480 Paroxysmal atrial fibrillation: Secondary | ICD-10-CM

## 2017-05-15 DIAGNOSIS — R0602 Shortness of breath: Secondary | ICD-10-CM | POA: Diagnosis not present

## 2017-05-15 NOTE — Patient Instructions (Addendum)
Medication Instructions:  Your physician recommends that you continue on your current medications as directed. Please refer to the Current Medication list given to you today.  Labwork: NONE  Testing/Procedures: Your physician has requested that you have en exercise stress myoview. For further information please visit [email or web address]. Please follow instruction sheet, as given.  Follow-Up: Your physician wants you to follow-up in: 6 months with Dr. Nishan. You will receive a reminder letter in the mail two months in advance. If you don't receive a letter, please call our office to schedule the follow-up appointment.   If you need a refill on your cardiac medications before your next appointment, please call your pharmacy.   

## 2017-05-20 ENCOUNTER — Telehealth (HOSPITAL_COMMUNITY): Payer: Self-pay | Admitting: *Deleted

## 2017-05-20 NOTE — Telephone Encounter (Signed)
Left message on voicemail per DPR in reference to upcoming appointment scheduled on 05/22/17 at 0945. with detailed instructions given per Myocardial Perfusion Study Information Sheet for the test. LM to arrive 15 minutes early, and that it is imperative to arrive on time for appointment to keep from having the test rescheduled. If you need to cancel or reschedule your appointment, please call the office within 24 hours of your appointment. Failure to do so may result in a cancellation of your appointment, and a $50 no show fee. Phone number given for call back for any questions.

## 2017-05-22 ENCOUNTER — Ambulatory Visit (HOSPITAL_COMMUNITY): Payer: Medicare Other | Attending: Cardiovascular Disease

## 2017-05-22 DIAGNOSIS — R0602 Shortness of breath: Secondary | ICD-10-CM

## 2017-05-22 DIAGNOSIS — R5383 Other fatigue: Secondary | ICD-10-CM | POA: Insufficient documentation

## 2017-05-22 DIAGNOSIS — I480 Paroxysmal atrial fibrillation: Secondary | ICD-10-CM

## 2017-05-22 DIAGNOSIS — I10 Essential (primary) hypertension: Secondary | ICD-10-CM | POA: Diagnosis not present

## 2017-05-22 DIAGNOSIS — R9439 Abnormal result of other cardiovascular function study: Secondary | ICD-10-CM | POA: Insufficient documentation

## 2017-05-22 DIAGNOSIS — I4891 Unspecified atrial fibrillation: Secondary | ICD-10-CM | POA: Diagnosis not present

## 2017-05-22 DIAGNOSIS — R0609 Other forms of dyspnea: Secondary | ICD-10-CM | POA: Insufficient documentation

## 2017-05-22 LAB — MYOCARDIAL PERFUSION IMAGING
CHL CUP NUCLEAR SDS: 6
CHL CUP RESTING HR STRESS: 53 {beats}/min
CSEPPHR: 78 {beats}/min
LHR: 0.29
LVDIAVOL: 112 mL (ref 46–106)
LVSYSVOL: 49 mL
SRS: 9
SSS: 15
TID: 1.03

## 2017-05-22 MED ORDER — TECHNETIUM TC 99M TETROFOSMIN IV KIT
10.2000 | PACK | Freq: Once | INTRAVENOUS | Status: AC | PRN
  Administered 2017-05-22: 10.2 via INTRAVENOUS
  Filled 2017-05-22: qty 11

## 2017-05-22 MED ORDER — TECHNETIUM TC 99M TETROFOSMIN IV KIT
32.9000 | PACK | Freq: Once | INTRAVENOUS | Status: AC | PRN
  Administered 2017-05-22: 32.9 via INTRAVENOUS
  Filled 2017-05-22: qty 33

## 2017-05-22 MED ORDER — REGADENOSON 0.4 MG/5ML IV SOLN
0.4000 mg | Freq: Once | INTRAVENOUS | Status: AC
  Administered 2017-05-22: 0.4 mg via INTRAVENOUS

## 2017-05-27 ENCOUNTER — Other Ambulatory Visit: Payer: Self-pay

## 2017-05-27 MED ORDER — METOPROLOL TARTRATE 25 MG PO TABS: 25.0000 mg | ORAL_TABLET | Freq: Two times a day (BID) | ORAL | 0 refills | Status: DC

## 2017-05-27 MED ORDER — RIVAROXABAN 20 MG PO TABS: 20.0000 mg | ORAL_TABLET | Freq: Every day | ORAL | 0 refills | Status: DC

## 2017-05-28 DIAGNOSIS — M25551 Pain in right hip: Secondary | ICD-10-CM | POA: Diagnosis not present

## 2017-05-28 DIAGNOSIS — M76891 Other specified enthesopathies of right lower limb, excluding foot: Secondary | ICD-10-CM | POA: Diagnosis not present

## 2017-06-03 ENCOUNTER — Ambulatory Visit
Admission: RE | Admit: 2017-06-03 | Discharge: 2017-06-03 | Disposition: A | Discharge status: Home / Self Care | Payer: Medicare Other | Source: Ambulatory Visit | Attending: Internal Medicine | Admitting: Internal Medicine

## 2017-06-03 DIAGNOSIS — R922 Inconclusive mammogram: Secondary | ICD-10-CM | POA: Diagnosis not present

## 2017-06-03 DIAGNOSIS — R921 Mammographic calcification found on diagnostic imaging of breast: Secondary | ICD-10-CM

## 2017-06-22 ENCOUNTER — Other Ambulatory Visit: Payer: Self-pay | Admitting: Internal Medicine

## 2017-06-24 NOTE — Telephone Encounter (Signed)
Rxs sent

## 2017-06-24 NOTE — Telephone Encounter (Signed)
Patient is very low on meds, would like this called in today

## 2017-07-15 ENCOUNTER — Telehealth: Payer: Self-pay | Admitting: Internal Medicine

## 2017-07-15 NOTE — Telephone Encounter (Signed)
Patient Name: Lori Joseph  DOB: 11-08-1941    Initial Comment Caller BP is 200/95, needs to know what to do.    Nurse Assessment  Nurse: Raphael Gibney, RN, Vanita Ingles Date/Time Eilene Ghazi Time): 07/15/2017 2:15:52 PM  Confirm and document reason for call. If symptomatic, describe symptoms. ---Caller states her BP is 200/95. She feels fine. Not light headed or headache. Blood pressure has been high all weekend. Recently started on another BP medications and xarelto.  Does the patient have any new or worsening symptoms? ---Yes  Will a triage be completed? ---Yes  Related visit to physician within the last 2 weeks? ---No  Does the PT have any chronic conditions? (i.e. diabetes, asthma, etc.) ---Yes  List chronic conditions. ---HTN; heart problems  Is this a behavioral health or substance abuse call? ---No     Guidelines    Guideline Title Affirmed Question Affirmed Notes  High Blood Pressure [8] Systolic BP >= 333 OR Diastolic >= 832 AND [9] having NO cardiac or neurologic symptoms    Final Disposition User   See Physician within 4 Hours (or PCP triage) Raphael Gibney, RN, Vera    Comments  No appts available with Dr. Larose Kells within 4 hrs. Does not want to see another doctor or go to urgent care. has appt tomorrow at 10:30 am with Dr. Larose Kells. Wants to know if she needs to take extra BP medication today until her appt tomorrow. Please call pt back regarding her medication.   Referrals  REFERRED TO PCP OFFICE   Disagree/Comply: Disagree  Disagree/Comply Reason: Disagree with instructions

## 2017-07-16 ENCOUNTER — Encounter: Payer: Self-pay | Admitting: Internal Medicine

## 2017-07-16 ENCOUNTER — Ambulatory Visit (INDEPENDENT_AMBULATORY_CARE_PROVIDER_SITE_OTHER): Payer: Medicare Other | Admitting: Internal Medicine

## 2017-07-16 VITALS — BP 156/92 | HR 54 | Temp 98.0°F | Resp 14 | Ht 63.0 in | Wt 220.5 lb

## 2017-07-16 DIAGNOSIS — I10 Essential (primary) hypertension: Secondary | ICD-10-CM | POA: Diagnosis not present

## 2017-07-16 DIAGNOSIS — I4891 Unspecified atrial fibrillation: Secondary | ICD-10-CM

## 2017-07-16 DIAGNOSIS — Z09 Encounter for follow-up examination after completed treatment for conditions other than malignant neoplasm: Secondary | ICD-10-CM | POA: Diagnosis not present

## 2017-07-16 LAB — CBC WITH DIFFERENTIAL/PLATELET
BASOS ABS: 0 10*3/uL (ref 0.0–0.1)
Basophils Relative: 1 % (ref 0.0–3.0)
Eosinophils Absolute: 0.1 10*3/uL (ref 0.0–0.7)
Eosinophils Relative: 2.1 % (ref 0.0–5.0)
HCT: 38.6 % (ref 36.0–46.0)
Hemoglobin: 12.9 g/dL (ref 12.0–15.0)
LYMPHS ABS: 1.2 10*3/uL (ref 0.7–4.0)
Lymphocytes Relative: 26.2 % (ref 12.0–46.0)
MCHC: 33.5 g/dL (ref 30.0–36.0)
MCV: 95.2 fl (ref 78.0–100.0)
MONO ABS: 0.3 10*3/uL (ref 0.1–1.0)
Monocytes Relative: 5.8 % (ref 3.0–12.0)
NEUTROS PCT: 64.9 % (ref 43.0–77.0)
Neutro Abs: 2.9 10*3/uL (ref 1.4–7.7)
Platelets: 177 10*3/uL (ref 150.0–400.0)
RBC: 4.05 Mil/uL (ref 3.87–5.11)
RDW: 13.6 % (ref 11.5–15.5)
WBC: 4.5 10*3/uL (ref 4.0–10.5)

## 2017-07-16 LAB — BASIC METABOLIC PANEL
BUN: 16 mg/dL (ref 6–23)
CALCIUM: 9.4 mg/dL (ref 8.4–10.5)
CO2: 30 mEq/L (ref 19–32)
Chloride: 104 mEq/L (ref 96–112)
Creatinine, Ser: 0.88 mg/dL (ref 0.40–1.20)
GFR: 66.37 mL/min (ref >60.00)
GLUCOSE: 107 mg/dL — AB (ref 70–99)
Potassium: 4 mEq/L (ref 3.5–5.1)
Sodium: 138 mEq/L (ref 135–145)

## 2017-07-16 MED ORDER — AMLODIPINE BESYLATE 5 MG PO TABS: 5.0000 mg | ORAL_TABLET | Freq: Every day | ORAL | 3 refills | Status: DC

## 2017-07-16 NOTE — Progress Notes (Addendum)
Subjective:    Patient ID: Lori Joseph, female    DOB: 1941-09-23, 76 y.o.   MRN: [id number]  DOS:  07/16/2017 Type of visit - description :  f/u Interval history: Patient main concern today is her blood pressure. Checks BPs from time to time, readings elevated  in the last 5 days, yesterday was 200/95. Good medication compliance. Her BP cuff is very tight on her arm. Denies any recent increase in sodium intake, no NSAIDs  Atrial fibrillation: New onset since the last visit, chart reviewed. Also complained of not sleeping well "for a while". She was told she has mild snoring. Occasionally feels tired but not necessarily sleepy and that she can tell.   BP Readings from Last 3 Encounters:  07/16/17 (!) 156/92  05/15/17 132/88  04/25/17 134/82     Review of Systems Denies chest pain, difficulty breathing. No lower extremity edema. No headache or dizziness.   Past Medical History:  Diagnosis Date  . Allergic rhinitis   . Anemia   . Atrial fibrillation ([place]) 03/2017  . Family history of anesthesia complication    daughter hard to wake up  . Glaucoma   . H/O acute pancreatitis   . H/O retinal detachment   . Hurthle cell neoplasm of thyroid    Right   . HYPERLIPIDEMIA   . Hypertension   . Hypothyroidism 05/31/2015  . OSTEOPENIA   . Primary cancer of upper outer quadrant of left female breast ([place])    Dr. Lindi Adie  . Radiation 11/11/15-12/30/14   left breast 50.4 gray, lumpectomy cavity boosted to 62.4 gray    Past Surgical History:  Procedure Laterality Date  . BREAST BIOPSY Left 08/27/14  . CHOLECYSTECTOMY  2000  . DILATION AND CURETTAGE OF UTERUS    . laser eye surgery, detached retina Left   . RADIOACTIVE SEED GUIDED MASTECTOMY WITH AXILLARY SENTINEL LYMPH NODE BIOPSY Left 09/16/2014   Procedure: RADIOACTIVE SEED GUIDED PARTIAL MASTECTOMY WITH AXILLARY SENTINEL LYMPH NODE BIOPSY;  Surgeon: [name], MD;  Location: [facility];  Service: General;   Laterality: Left;  . thyoidectomy Left    partial  . THYROID LOBECTOMY N/A 02/10/2015   Procedure: RIGHT THYROID LOBECTOMY;  Surgeon: [name], MD;  Location: [facility];  Service: General;  Laterality: N/A;  . TONSILLECTOMY    . TUBAL LIGATION      Social History   Social History  . Marital status: Married    Spouse name: Ruthann Cancer  . Number of children: 2  . Years of education: N/A   Occupational History  . 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 Smoking status: Never Smoker  . Smokeless tobacco: Never Used  . Alcohol use No  . Drug use: No  . Sexual activity: Not on file   Other Topics Concern  . Not on file   Social History Narrative   Lives w/ husband      Allergies as of 07/16/2017      Reactions   Codeine Anaphylaxis   Hyaluronic Acid  [collagen-chond-hyaluronic Acid] Itching, Rash   Carvedilol    REACTION: leg pain, edema   Felodipine    REACTION: HA, insomnia   Lisinopril    REACTION: HA, SWELLING   Losartan Potassium    REACTION: cough   Radiaplexrx [pyridoxine-zinc Picolinate] Itching, Rash      Medication List       Accurate as of 07/16/17  8:09 PM. Always use  your most recent med list.          amLODipine 5 MG tablet Commonly known as:  NORVASC Take 1 tablet (5 mg total) by mouth daily.   anastrozole 1 MG tablet Commonly known as:  ARIMIDEX Take 1 tablet (1 mg total) by mouth daily.   augmented betamethasone dipropionate 0.05 % cream Commonly known as:  DIPROLENE-AF Apply topically 2 (two) times daily.   BENICAR 40 MG tablet Generic drug:  olmesartan Take 1 tablet (40 mg total) by mouth daily.   calcium carbonate 1250 MG capsule Take 2,500 mg by mouth every morning.   cholecalciferol 1000 units tablet Commonly known as:  VITAMIN D Take 2,500 Units by mouth every morning.   dorzolamide-timolol 22.3-6.8 MG/ML ophthalmic solution Commonly known as:  COSOPT Place 1 drop into both  eyes every morning.   fluticasone 0.005 % ointment Commonly known as:  CUTIVATE Apply 1 application topically 3 (three) times daily as needed.   latanoprost 0.005 % ophthalmic solution Commonly known as:  XALATAN PLACE 1 DROP INTO BOTH EYES NIGHTLY.   levothyroxine 25 MCG tablet Commonly known as:  SYNTHROID, LEVOTHROID Take 1 tablet (25 mcg total) by mouth daily before breakfast.   metoprolol tartrate 25 MG tablet Commonly known as:  LOPRESSOR Take 1 tablet (25 mg total) by mouth 2 (two) times daily.   multivitamin tablet Take 1 tablet by mouth every morning.   pravastatin 40 MG tablet Commonly known as:  PRAVACHOL Take 1 tablet (40 mg total) by mouth daily.   rivaroxaban 20 MG Tabs tablet Commonly known as:  XARELTO Take 1 tablet (20 mg total) by mouth daily with supper.          Objective:   Physical Exam BP (!) 156/92 (BP Location: Right Arm, Patient Position: Sitting, Cuff Size: Normal)   Pulse (!) 54   Temp 98 F (36.7 C) (Oral)   Resp 14   Ht 5\' 3"  (1.6 m)   Wt 220 lb 8 oz (100 kg)   LMP  (LMP Unknown)   SpO2 97%   BMI 39.06 kg/m  General:   Well developed, well nourished . NAD.  BP check on the right arm by me, manually: 155/70 HEENT:  Normocephalic . Face symmetric, atraumatic Lungs:  CTA B Normal respiratory effort, no intercostal retractions, no accessory muscle use. Heart: Seems regular today,  no murmur.  No pretibial edema bilaterally  Skin: Not pale. Not jaundice Neurologic:  alert & oriented X3.  Speech normal, gait appropriate for age and unassisted Psych--  Cognition and judgment appear intact.  Cooperative with normal attention span and concentration.  Behavior appropriate. No anxious or depressed appearing.      Assessment & Plan:  Assessment  HTN Hyperlipidemia Hypothyroidism Osteopenia: T score 2010 normal, T score 2016 (1.2), T score 05-2012 (-1.2), on calcium and vitamin D CV: -A. fib, new onset 03-2017 -Low risk stress  test 04-2017 Oncology: --Breast cancer, left, dx  2015, XRT 10/2015 to 12/2014, on Arimidex --Hurthle Cell neoplasm of the thyroid, thyroidectomy 01-2015 SKIN: ---Eczema ---[place] 2012, Dr Allyson Sabal ANEMIA- saw hematology, rx IV iron 10-2015 Chronic right-sided chest pain:  x-rays CT abdomen and pelvis 2016 negative, saw pain mngmt, ortho ---> better with Lidoderm patch and a local injection in the back Glaucoma   H/o acute pancreatitis H/o retinal  detachment Pulmonary: Abnormal chest x-ray and CT chest: Last CT chest 01-2016: See report. CXR 03/27/2016: No acute. Pulmonary: f/u prn ++ FH Lung Ca (pt is  not a smoker)  PLAN: HTN: Currently on metoprolol (pulse 55) , Benicar. BP increasing lately at home, could be d/t cuff size but here BP also slt elevated; unclear etiology, good medication compliance. We'll get a BMP, CBC and start amlodipine 5 mg. Patient to continue monitoring BPs with a large cuff. See AVS A. fib: chart reviewed' Since the last visit, labs were okay, stress test low risk, saw cardiology, EKGs show actually sinus rhythm.  Currently on rate control and Xarelto.  Clinically she seems to be in sinus rhythm today Obesity-Morbid, mild snoring: Epworth scale negative (score 2) RTC 3 months

## 2017-07-16 NOTE — Assessment & Plan Note (Addendum)
HTN: Currently on metoprolol (pulse 55) , Benicar. BP increasing lately at home, could be d/t cuff size but here BP also slt elevated; unclear etiology, good medication compliance. We'll get a BMP, CBC and start amlodipine 5 mg. Patient to continue monitoring BPs with a large cuff. See AVS A. fib: chart reviewed' Since the last visit, labs were okay, stress test low risk, saw cardiology, EKGs show actually sinus rhythm.  Currently on rate control and Xarelto.  Clinically she seems to be in sinus rhythm today Obesity-Morbid, mild snoring: Epworth scale negative (score 2) RTC 3 months

## 2017-07-16 NOTE — Progress Notes (Signed)
Pre visit review using our clinic review tool, if applicable. No additional management support is needed unless otherwise documented below in the visit note. 

## 2017-07-16 NOTE — Patient Instructions (Signed)
GO TO THE LAB : Get the blood work     GO TO THE FRONT DESK Schedule your next appointment for a  Check up in 3 months   ADD amlodipine 5 mg one tablet every day.   Check the  blood pressure   daily Be sure your blood pressure is between 110/65 and  145/85. If it is consistently higher or lower, let me know  Use large cuff, rest 15 minutes before any blood pressure checking

## 2017-08-05 DIAGNOSIS — H40003 Preglaucoma, unspecified, bilateral: Secondary | ICD-10-CM | POA: Diagnosis not present

## 2017-08-05 DIAGNOSIS — H40023 Open angle with borderline findings, high risk, bilateral: Secondary | ICD-10-CM | POA: Diagnosis not present

## 2017-08-06 ENCOUNTER — Ambulatory Visit: Payer: Medicare Other | Admitting: Internal Medicine

## 2017-08-08 ENCOUNTER — Other Ambulatory Visit: Payer: Self-pay | Admitting: Internal Medicine

## 2017-08-27 ENCOUNTER — Other Ambulatory Visit: Payer: Self-pay

## 2017-08-27 MED ORDER — RIVAROXABAN 20 MG PO TABS
20.0000 mg | ORAL_TABLET | Freq: Every day | ORAL | 0 refills | Status: DC
Start: 2017-08-27 — End: 2017-11-21

## 2017-09-06 ENCOUNTER — Other Ambulatory Visit: Payer: Self-pay

## 2017-09-06 MED ORDER — AMLODIPINE BESYLATE 5 MG PO TABS: 5.0000 mg | ORAL_TABLET | Freq: Every day | ORAL | 0 refills | Status: DC

## 2017-09-13 ENCOUNTER — Other Ambulatory Visit: Payer: Self-pay | Admitting: Internal Medicine

## 2017-09-25 ENCOUNTER — Encounter: Payer: Self-pay | Admitting: Internal Medicine

## 2017-09-25 ENCOUNTER — Ambulatory Visit (INDEPENDENT_AMBULATORY_CARE_PROVIDER_SITE_OTHER): Payer: Medicare Other | Admitting: Internal Medicine

## 2017-09-25 VITALS — BP 128/78 | HR 50 | Temp 97.8°F | Resp 14 | Ht 63.0 in | Wt 222.4 lb

## 2017-09-25 DIAGNOSIS — E039 Hypothyroidism, unspecified: Secondary | ICD-10-CM

## 2017-09-25 DIAGNOSIS — E785 Hyperlipidemia, unspecified: Secondary | ICD-10-CM | POA: Diagnosis not present

## 2017-09-25 DIAGNOSIS — Z0000 Encounter for general adult medical examination without abnormal findings: Secondary | ICD-10-CM

## 2017-09-25 DIAGNOSIS — I10 Essential (primary) hypertension: Secondary | ICD-10-CM

## 2017-09-25 DIAGNOSIS — M8580 Other specified disorders of bone density and structure, unspecified site: Secondary | ICD-10-CM | POA: Diagnosis not present

## 2017-09-25 DIAGNOSIS — Z23 Encounter for immunization: Secondary | ICD-10-CM

## 2017-09-25 MED ORDER — CLONIDINE HCL 0.1 MG PO TABS: 0.1000 mg | ORAL_TABLET | Freq: Two times a day (BID) | ORAL | 6 refills | Status: DC

## 2017-09-25 NOTE — Patient Instructions (Addendum)
GO TO THE LAB : Get the blood work     GO TO THE FRONT DESK Schedule your next appointment for a  checkup in 6 months   Stop amlodipine  Start clonidine 0.1 mg one tablet twice a day  Check the  blood pressure 2 or 3 times a week Be sure your blood pressure is between 110/65 and  145/85. If it is consistently higher or lower, let me know  Schedule a Medicare Wellness at your convenience

## 2017-09-25 NOTE — Assessment & Plan Note (Addendum)
Td 2008: due, will do at next opportunity; pneumonia shot 2008;prevnar 2015; zostavax 2012; shingrix discussed; Flu shot  today -Female care:  per gyn, next app 11-2017 H/o breast ca, doing MMGs, next schedule 11-2017 -[facility]: Cscope @ [facility] 01-2008 : 2 polyps ----> tubular adenomas w/  low grade dysplasia Cscope 08/2010, Dr Henrene Pastor, very small polyps Last Cscope 08-2016  Diet- Exercise discussed Labs: FLP, AST, ALT, TSH

## 2017-09-25 NOTE — Progress Notes (Signed)
Pre visit review using our clinic review tool, if applicable. No additional management support is needed unless otherwise documented below in the visit note. 

## 2017-09-25 NOTE — Progress Notes (Signed)
Subjective:    Patient ID: Lori Joseph, female    DOB: 10-Jan-1941, 76 y.o.   MRN: [id number]  DOS:  09/25/2017 Type of visit - description : CPX Interval history: In general feeling well. Start amlodipine  BP improved however has developed significant feet edema, mostly when she is inactive, request to change amlodipine.  Wt Readings from Last 3 Encounters:  09/25/17 222 lb 6 oz (100.9 kg)  07/16/17 220 lb 8 oz (100 kg)  05/22/17 219 lb (99.3 kg)     Review of Systems   Other than above, a 14 point review of systems is negative     Past Medical History:  Diagnosis Date  . Allergic rhinitis   . Anemia   . Atrial fibrillation ([place]) 03/2017  . Family history of anesthesia complication    daughter hard to wake up  . Glaucoma   . H/O acute pancreatitis   . H/O retinal detachment   . Hurthle cell neoplasm of thyroid    Right   . HYPERLIPIDEMIA   . Hypertension   . Hypothyroidism 05/31/2015  . OSTEOPENIA   . Primary cancer of upper outer quadrant of left female breast ([name])    Dr. Lindi Adie  . Radiation 11/11/15-12/30/14   left breast 50.4 gray, lumpectomy cavity boosted to 62.4 gray    Past Surgical History:  Procedure Laterality Date  . BREAST BIOPSY Left 08/27/14  . CHOLECYSTECTOMY  2000  . DILATION AND CURETTAGE OF UTERUS    . laser eye surgery, detached retina Left   . RADIOACTIVE SEED GUIDED PARTIAL MASTECTOMY WITH AXILLARY SENTINEL LYMPH NODE BIOPSY Left 09/16/2014   Procedure: RADIOACTIVE SEED GUIDED PARTIAL MASTECTOMY WITH AXILLARY SENTINEL LYMPH NODE BIOPSY;  Surgeon: [name], MD;  Location: [facility];  Service: General;  Laterality: Left;  . thyoidectomy Left    partial  . THYROID LOBECTOMY N/A 02/10/2015   Procedure: RIGHT THYROID LOBECTOMY;  Surgeon: [name], MD;  Location: [facility];  Service: General;  Laterality: N/A;  . TONSILLECTOMY    . TUBAL LIGATION      Social History   Social History  . Marital status: Married    Spouse  name: Ruthann Cancer  . Number of children: 2  . Years of education: N/A   Occupational History  . RETIRED-- property management [employer]   Social History Main Topics  . Smoking status: Never Smoker  . Smokeless tobacco: Never Used  . Alcohol use No  . Drug use: No  . Sexual activity: Not on file   Other Topics Concern  . Not on file   Social History Narrative   Lives w/ husband     Family History  Problem Relation Age of Onset  . Breast cancer Sister 92  . Cancer Father        lung cancer ; smoker  . Cancer Brother        3 brothers with lung cancer, all smokers  . Cancer Paternal Uncle        2 pat uncles with lung cancer, smokers  . Kidney cancer Sister 93  . Melanoma Sister 67  . Ovarian cancer Other 77       niece with ovarian cancer (related through sister with breast cancer)  . Colon cancer Neg Hx   . CAD Neg Hx   . Esophageal cancer Neg Hx   . Stomach cancer Neg Hx   . Rectal cancer Neg Hx      Allergies as of  09/25/2017      Reactions   Codeine Anaphylaxis   Hyaluronic Acid  [collagen-chond-hyaluronic Acid] Itching, Rash   Carvedilol    REACTION: leg pain, edema   Felodipine    REACTION: HA, insomnia   Lisinopril    REACTION: HA, SWELLING   Losartan Potassium    REACTION: cough   Radiaplexrx [pyridoxine-zinc Picolinate] Itching, Rash      Medication List       Accurate as of 09/25/17 11:59 PM. Always use your most recent med list.          anastrozole 1 MG tablet Commonly known as:  ARIMIDEX Take 1 tablet (1 mg total) by mouth daily.   augmented betamethasone dipropionate 0.05 % cream Commonly known as:  DIPROLENE-AF Apply topically 2 (two) times daily.   BENICAR 40 MG tablet Generic drug:  olmesartan Take 1 tablet (40 mg total) by mouth daily.   calcium carbonate 1250 MG capsule Take 2,500 mg by mouth every morning.   cholecalciferol 1000 units tablet Commonly known as:  VITAMIN D Take 2,500 Units by mouth every morning.    cloNIDine 0.1 MG tablet Commonly known as:  CATAPRES Take 1 tablet (0.1 mg total) by mouth 2 (two) times daily.   dorzolamide-timolol 22.3-6.8 MG/ML ophthalmic solution Commonly known as:  COSOPT Place 1 drop into both eyes every morning.   fluticasone 0.005 % ointment Commonly known as:  CUTIVATE Apply 1 application topically 3 (three) times daily as needed.   latanoprost 0.005 % ophthalmic solution Commonly known as:  XALATAN PLACE 1 DROP INTO BOTH EYES NIGHTLY.   levothyroxine 25 MCG tablet Commonly known as:  SYNTHROID, LEVOTHROID Take 1 tablet (25 mcg total) by mouth daily before breakfast.   metoprolol tartrate 25 MG tablet Commonly known as:  LOPRESSOR Take 1 tablet (25 mg total) by mouth 2 (two) times daily.   multivitamin tablet Take 1 tablet by mouth every morning.   pravastatin 40 MG tablet Commonly known as:  PRAVACHOL Take 1 tablet (40 mg total) by mouth daily.   rivaroxaban 20 MG Tabs tablet Commonly known as:  XARELTO Take 1 tablet (20 mg total) by mouth daily with supper.          Objective:   Physical Exam BP 128/78 (BP Location: Right Arm, Patient Position: Sitting, Cuff Size: Normal)   Pulse (!) 50   Temp 97.8 F (36.6 C) (Oral)   Resp 14   Ht 5\' 3"  (1.6 m)   Wt 222 lb 6 oz (100.9 kg)   LMP  (LMP Unknown)   SpO2 98%   BMI 39.39 kg/m  General:   Well developed, well nourished . NAD.  HEENT:  Normocephalic . Face symmetric, atraumatic Lungs:  CTA B Normal respiratory effort, no intercostal retractions, no accessory muscle use. Heart: Seems regular today,  no murmur.  No pretibial edema bilaterally  Abdomen:  Not distended, soft, non-tender. No rebound or rigidity.   Skin: Exposed areas without rash. Not pale. Not jaundice Neurologic:  alert & oriented X3.  Speech normal, gait appropriate for age and unassisted Strength symmetric and appropriate for age.  Psych: Cognition and judgment appear intact.  Cooperative with normal  attention span and concentration.  Behavior appropriate. No anxious or depressed appearing.     Assessment & Plan:    Assessment  HTN Hyperlipidemia Hypothyroidism Osteopenia: T score 2010 normal,  T score 05-2012 (-1.2), on calcium and vitamin D CV: -A. fib, new onset 03-2017 -Low risk stress test 04-2017 Oncology: --  Breast cancer, left, dx  2015, XRT 10/2015 to 12/2014, on Arimidex --Hurthle Cell neoplasm of the thyroid, thyroidectomy 01-2015 SKIN: ---Eczema ---[place] 2012, Dr Allyson Sabal ANEMIA- saw hematology, rx IV iron 10-2015 Chronic right-sided chest pain:  x-rays CT abdomen and pelvis 2016 negative, saw pain mngmt, ortho ---> better with Lidoderm patch and a local injection in the back Glaucoma   H/o acute pancreatitis H/o retinal  detachment Pulmonary: Abnormal chest x-ray and CT chest: Last CT chest 01-2016: See report. CXR 03/27/2016: No acute. Pulmonary: f/u prn ++ FH Lung Ca (pt is not a smoker)  PLAN: HTN: Currently on metoprolol, Benicar. Amlodipine added at the last visit, developed edema, pt request a change. Declined to take diuretics. Options are limited. Recommend clonidine 0.1 twice a day. Hyperlipidemia: On Pravachol, check labs Hypothyroidism: On Synthroid, check a TSH Osteopenia : On calcium and vitamin D. Last bone density test 2013: T score -1.2  RTC 6 months, sooner if BP not controlled

## 2017-09-26 ENCOUNTER — Other Ambulatory Visit: Payer: Self-pay | Admitting: Internal Medicine

## 2017-09-26 LAB — LIPID PANEL
Cholesterol: 169 mg/dL (ref 0–200)
HDL: 57.1 mg/dL (ref >39.00)
LDL Cholesterol: 94 mg/dL (ref 0–99)
NONHDL: 112.3
Total CHOL/HDL Ratio: 3
Triglycerides: 92 mg/dL (ref 0.0–149.0)
VLDL: 18.4 mg/dL (ref 0.0–40.0)

## 2017-09-26 LAB — ALT: ALT: 14 U/L (ref 0–35)

## 2017-09-26 LAB — AST: AST: 17 U/L (ref 0–37)

## 2017-09-26 LAB — TSH: TSH: 3.07 u[IU]/mL (ref 0.35–4.50)

## 2017-09-27 NOTE — Assessment & Plan Note (Signed)
HTN: Currently on metoprolol, Benicar. Amlodipine added at the last visit, developed edema, pt request a change. Declined to take diuretics. Options are limited. Recommend clonidine 0.1 twice a day. Hyperlipidemia: On Pravachol, check labs Hypothyroidism: On Synthroid, check a TSH Osteopenia : On calcium and vitamin D. Last bone density test 2013: T score -1.2  RTC 6 months, sooner if BP not controlled

## 2017-09-30 ENCOUNTER — Telehealth: Payer: Self-pay | Admitting: Internal Medicine

## 2017-09-30 DIAGNOSIS — I10 Essential (primary) hypertension: Secondary | ICD-10-CM

## 2017-09-30 MED ORDER — HYDROCHLOROTHIAZIDE 25 MG PO TABS: 25.0000 mg | ORAL_TABLET | Freq: Every day | ORAL | 0 refills | Status: DC

## 2017-09-30 NOTE — Telephone Encounter (Signed)
Please advise 

## 2017-09-30 NOTE — Telephone Encounter (Signed)
Stop clonidine HCTZ 25 mg 1 p.o. daily #30 no refills Monitor BPs BMP in 2 weeks.

## 2017-09-30 NOTE — Telephone Encounter (Signed)
Spoke w/ Pt, informed of recommendations. HCTZ 25mg  sent to [facility]. Lab appt scheduled 10/15/2017. BMP ordered.

## 2017-09-30 NOTE — Telephone Encounter (Signed)
Patient states that she does not want to take clonidine, it makes her feel light headed, like she is in a daze. Patient states that she would like to go back to Amlodipine & small diuretic. Please advise.

## 2017-10-15 ENCOUNTER — Ambulatory Visit (INDEPENDENT_AMBULATORY_CARE_PROVIDER_SITE_OTHER): Payer: Medicare Other

## 2017-10-15 ENCOUNTER — Other Ambulatory Visit (INDEPENDENT_AMBULATORY_CARE_PROVIDER_SITE_OTHER): Payer: Medicare Other

## 2017-10-15 ENCOUNTER — Other Ambulatory Visit: Payer: Self-pay | Admitting: Internal Medicine

## 2017-10-15 DIAGNOSIS — Z23 Encounter for immunization: Secondary | ICD-10-CM

## 2017-10-15 DIAGNOSIS — I10 Essential (primary) hypertension: Secondary | ICD-10-CM | POA: Diagnosis not present

## 2017-10-15 LAB — BASIC METABOLIC PANEL
BUN: 26 mg/dL — AB (ref 6–23)
CHLORIDE: 105 meq/L (ref 96–112)
CO2: 26 meq/L (ref 19–32)
CREATININE: 1 mg/dL (ref 0.40–1.20)
Calcium: 9.4 mg/dL (ref 8.4–10.5)
GFR: 57.23 mL/min — ABNORMAL LOW (ref >60.00)
GLUCOSE: 146 mg/dL — AB (ref 70–99)
Potassium: 3.8 mEq/L (ref 3.5–5.1)
Sodium: 139 mEq/L (ref 135–145)

## 2017-10-15 NOTE — Progress Notes (Signed)
Pre visit review using our clinic review tool, if applicable. No additional management support is needed unless otherwise documented below in the visit note.   Pt here for TD vaccination. Administered R deltoid. Pt tolerated injection well.   Pt also due for booster on Pneumovax-23. Nurse visit scheduled for ~30 days to receive.

## 2017-10-16 MED ORDER — HYDROCHLOROTHIAZIDE 25 MG PO TABS: 25.0000 mg | ORAL_TABLET | Freq: Every day | ORAL | 5 refills | Status: DC

## 2017-10-16 NOTE — Addendum Note (Signed)
Addended byDamita Dunnings D on: 10/16/2017 02:12 PM   Modules accepted: Orders

## 2017-11-11 ENCOUNTER — Telehealth: Payer: Self-pay | Admitting: Internal Medicine

## 2017-11-11 DIAGNOSIS — Z780 Asymptomatic menopausal state: Secondary | ICD-10-CM

## 2017-11-11 DIAGNOSIS — M8580 Other specified disorders of bone density and structure, unspecified site: Secondary | ICD-10-CM

## 2017-11-11 NOTE — Telephone Encounter (Signed)
FYI

## 2017-11-11 NOTE — Telephone Encounter (Signed)
Pt stating that since she was started on diuretic, Hydrochlorothiazide, she has noticed that he DPP has been in the 50s. Her SBP has remained in the 140s and had no complaints of other symptoms. Pt wanted to make Dr. Larose Kells aware of BPs and wanted to  know if BP could be checked tomorrow when she comes in to have a pneumonia vaccine at 10:30am.

## 2017-11-11 NOTE — Telephone Encounter (Signed)
Noted in appt notes; bone density ordered.

## 2017-11-11 NOTE — Addendum Note (Signed)
Addended byDamita Dunnings D on: 11/11/2017 05:04 PM   Modules accepted: Orders

## 2017-11-11 NOTE — Telephone Encounter (Signed)
1.  Please check her blood pressure when she comes tomorrow 2.  Also, needs a bone density test, DX osteopenia, please arrange

## 2017-11-12 ENCOUNTER — Telehealth: Payer: Self-pay

## 2017-11-12 ENCOUNTER — Ambulatory Visit (INDEPENDENT_AMBULATORY_CARE_PROVIDER_SITE_OTHER): Payer: Medicare Other | Admitting: Internal Medicine

## 2017-11-12 VITALS — BP 131/68 | HR 57

## 2017-11-12 DIAGNOSIS — I10 Essential (primary) hypertension: Secondary | ICD-10-CM

## 2017-11-12 DIAGNOSIS — Z23 Encounter for immunization: Secondary | ICD-10-CM

## 2017-11-12 NOTE — Progress Notes (Signed)
Pre visit review using our clinic tool,if applicable. No additional management support is needed unless otherwise documented below in the visit note.   Patient in for BP check per order from Dr. Larose Kells due to patient stating shew has had elevated BP since starting Hctz. Patietn also in fpr PneumoVax 23 which was given in Right arm and patient tolerated well.  BP today =131/68 P = 57  Per Dr. Larose Kells BP acceptable. Check BP's  5-6 times per week if lower than 110/65 or higher than 140/85 contact office for appointment. Continue medications as ordered.

## 2017-11-12 NOTE — Telephone Encounter (Signed)
Completed.

## 2017-11-12 NOTE — Progress Notes (Signed)
See phone note from yesterday, patient concerned about her BP not being well controlled. No symptoms. BP today 131/68 which is acceptable, pulse 57. Advised patient: BP today is good, no changes   Check the  blood pressure 5 to 6 tomes per  Week  Be sure your blood pressure is between 110/65 and  140/85.  if it is consistently higher or lower, let me know  JP

## 2017-11-21 ENCOUNTER — Other Ambulatory Visit: Payer: Self-pay

## 2017-11-21 MED ORDER — RIVAROXABAN 20 MG PO TABS: 20.0000 mg | ORAL_TABLET | Freq: Every day | ORAL | 0 refills | Status: DC

## 2017-11-22 NOTE — Progress Notes (Signed)
Cardiology Office Note   Date:  12/02/2017   ID:  Lori Joseph, DOB [birth year], MRN [id number]  PCP:  Colon Branch, MD  Cardiologist:   Jenkins Rouge, MD   No chief complaint on file.     History of Present Illness: Lori Joseph is a 76 y.o. female first seen in June 2018 for new onset atrial fibrillation.  Seen by Dr Larose Kells 04/25/17 for UTI and fatigue.  So tired I might pass out.  She was started on metoprolol and xaretlto  Echo ordered and reviewed EF 55-60% mild LAE trivial MR She has hypothyroidism TSH noral 04/25/17 2.95 CRF;s include HTN and elevated lipids.    This patients CHA2DS2-VASc Score and unadjusted Ischemic Stroke Rate (% per year) is equal to 4.8 % stroke rate/year from a score of 4  Above score calculated as 1 point each if present [CHF, HTN, DM, Vascular=MI/PAD/Aortic Plaque, Age if 65-74, or Female] Above score calculated as 2 points each if present [Age > 75, or Stroke/TIA/TE]  Converted on her own was in NSR when initially seen in June 3 weeks after primary visit    F/U myovue reviewed and noraml except breast attenuation EF 56% no ischemia   Doing well no palpitations compliant with meds Discussed phone apps and iwatch apps for PAF    Past Medical History:  Diagnosis Date  . Allergic rhinitis   . Anemia   . Atrial fibrillation ([place]) 03/2017  . Family history of anesthesia complication    daughter hard to wake up  . Glaucoma   . H/O acute pancreatitis   . H/O retinal detachment   . Hurthle cell neoplasm of thyroid    Right   . HYPERLIPIDEMIA   . Hypertension   . Hypothyroidism 05/31/2015  . OSTEOPENIA   . Primary cancer of upper outer quadrant of left female breast ([place])    Dr. Lindi Adie  . Radiation 11/11/15-12/30/14   left breast 50.4 gray, lumpectomy cavity boosted to 62.4 gray    Past Surgical History:  Procedure Laterality Date  . BREAST BIOPSY Left 08/27/14  . CHOLECYSTECTOMY  2000  . DILATION AND CURETTAGE OF UTERUS    . laser eye surgery,  detached retina Left   . RADIOACTIVE SEED GUIDED PARTIAL MASTECTOMY WITH AXILLARY SENTINEL LYMPH NODE BIOPSY Left 09/16/2014   Procedure: RADIOACTIVE SEED GUIDED PARTIAL MASTECTOMY WITH AXILLARY SENTINEL LYMPH NODE BIOPSY;  Surgeon: [name], MD;  Location: [facility];  Service: General;  Laterality: Left;  . thyoidectomy Left    partial  . THYROID LOBECTOMY N/A 02/10/2015   Procedure: RIGHT THYROID LOBECTOMY;  Surgeon: [name], MD;  Location: [facility];  Service: General;  Laterality: N/A;  . TONSILLECTOMY    . TUBAL LIGATION       Current Outpatient Medications  Medication Sig Dispense Refill  . anastrozole (ARIMIDEX) 1 MG tablet Take 1 tablet (1 mg total) by mouth daily. 90 tablet 3  . calcium carbonate 1250 MG capsule Take 2,500 mg by mouth every morning.     . cholecalciferol (VITAMIN D) 1000 UNITS tablet Take 2,500 Units by mouth every morning.     . dorzolamide-timolol (COSOPT) 22.3-6.8 MG/ML ophthalmic solution Place 1 drop into both eyes every morning.     . hydrochlorothiazide (HYDRODIURIL) 25 MG tablet Take 1 tablet (25 mg total) by mouth daily. 30 tablet 5  . latanoprost (XALATAN) 0.005 % ophthalmic solution PLACE 1 DROP INTO BOTH EYES NIGHTLY.    Marland Kitchen  levothyroxine (SYNTHROID, LEVOTHROID) 25 MCG tablet Take 1 tablet (25 mcg total) by mouth daily before breakfast. 90 tablet 1  . metoprolol tartrate (LOPRESSOR) 25 MG tablet Take 1 tablet (25 mg total) by mouth 2 (two) times daily. 180 tablet 2  . Multiple Vitamin (MULTIVITAMIN) tablet Take 1 tablet by mouth every morning.     . olmesartan (BENICAR) 40 MG tablet Take 1 tablet (40 mg total) daily by mouth. 90 tablet 1  . pravastatin (PRAVACHOL) 40 MG tablet Take 1 tablet (40 mg total) by mouth daily. 90 tablet 1  . rivaroxaban (XARELTO) 20 MG TABS tablet Take 1 tablet (20 mg total) by mouth daily with supper. 90 tablet 0   Current Facility-Administered Medications  Medication Dose Route Frequency Provider Last  Rate Last Dose  . 0.9 %  sodium chloride infusion  500 mL Intravenous Continuous Irene Shipper, MD        Allergies:   Codeine; Hyaluronic acid  [collagen-chond-hyaluronic acid]; Carvedilol; Felodipine; Lisinopril; Losartan potassium; and Radiaplexrx [pyridoxine-zinc picolinate]    Social History:  The patient  reports that  has never smoked. she has never used smokeless tobacco. She reports that she does not drink alcohol or use drugs.   Family History:  The patient's family history includes Breast cancer (age of onset: 39) in her sister; Cancer in her brother, father, and paternal uncle; Kidney cancer (age of onset: 28) in her sister; Melanoma (age of onset: 65) in her sister; Ovarian cancer (age of onset: 15) in her other.    ROS:  Please see the history of present illness.   Otherwise, review of systems are positive for none.   All other systems are reviewed and negative.    PHYSICAL EXAM: VS:  BP 138/72   Pulse (!) 51   Ht 5\' 3"  (1.6 m)   Wt 223 lb 12 oz (101.5 kg)   LMP  (LMP Unknown)   SpO2 99%   BMI 39.64 kg/m  , BMI Body mass index is 39.64 kg/m. Affect appropriate Overweight looks younger than stated age  39: normal Neck supple with no adenopathy JVP normal no bruits no thyromegaly Lungs clear with no wheezing and good diaphragmatic motion Heart:  S1/S2 no murmur, no rub, gallop or click PMI normal Abdomen: benighn, BS positve, no tenderness, no AAA no bruit.  No HSM or HJR Distal pulses intact with no bruits Plus one edema varicosities right greater than left  Neuro non-focal Skin warm and dry No muscular weakness    EKG:  04/25/17 afib rate 119 otherwise normal  05/15/17  SR rate 54 flat ST segments inferior lateral    Recent Labs: 07/16/2017: Hemoglobin 12.9; Platelets 177.0 09/25/2017: ALT 14; TSH 3.07 10/15/2017: BUN 26; Creatinine, Ser 1.00; Potassium 3.8; Sodium 139    Lipid Panel    Component Value Date/Time   CHOL 169 09/25/2017 1615   TRIG  92.0 09/25/2017 1615   HDL 57.10 09/25/2017 1615   CHOLHDL 3 09/25/2017 1615   VLDL 18.4 09/25/2017 1615   LDLCALC 94 09/25/2017 1615   LDLDIRECT 177.2 08/04/2013 1147      Wt Readings from Last 3 Encounters:  12/02/17 223 lb 12 oz (101.5 kg)  09/25/17 222 lb 6 oz (100.9 kg)  07/16/17 220 lb 8 oz (100 kg)      Other studies Reviewed: Additional studies/ records that were reviewed today include: Notes DR Larose Kells labs and echo see HPI .    ASSESSMENT AND PLAN:  1.  Atrial  Fibrillation continue xarelto and lopressor maintaining NSR no bleeding issues CBC and BMET normal 09/25/17  2. HTN: Well controlled.  Continue current medications and low sodium Dash type diet.     3. HL: continue statin labs with Dr Larose Kells last LDL 94 with normal LFTls 09/25/17  4. Thyroid on replacement TSH 3.07 09/25/17   5. Fatigue improved with conversion to NSR likely from rapid rate    Current medicines are reviewed at length with the patient today.  The patient does not have concerns regarding medicines.  The following changes have been made:  no change  Labs/ tests ordered today include:   No orders of the defined types were placed in this encounter.    Disposition:   FU with me in a year      Signed, Jenkins Rouge, MD  12/02/2017 10:40 AM    [address] [place], [place], [place]  [address] Phone: [phone]; Fax: [phone]

## 2017-11-28 ENCOUNTER — Other Ambulatory Visit: Payer: Self-pay | Admitting: Internal Medicine

## 2017-11-28 DIAGNOSIS — Z9889 Other specified postprocedural states: Secondary | ICD-10-CM

## 2017-11-29 ENCOUNTER — Telehealth: Payer: Self-pay | Admitting: *Deleted

## 2017-11-29 NOTE — Telephone Encounter (Signed)
Form signed and faxed to [facility] at [phone]. Form sent for scanning.

## 2017-11-29 NOTE — Telephone Encounter (Signed)
Received Physician Orders from [place]; forwarded to provider/SLS 01/04

## 2017-12-01 ENCOUNTER — Telehealth: Payer: Self-pay | Admitting: Hematology and Oncology

## 2017-12-01 NOTE — Telephone Encounter (Signed)
Called pt to inform of appt time chg on 1/29 from 2pm to 10.15am. No answer and vm not set up. Mailed new appt calendar.

## 2017-12-02 ENCOUNTER — Encounter: Payer: Self-pay | Admitting: Cardiovascular Disease

## 2017-12-02 ENCOUNTER — Ambulatory Visit: Payer: Medicare Other | Admitting: Cardiovascular Disease

## 2017-12-02 VITALS — BP 138/72 | HR 51 | Ht 63.0 in | Wt 223.8 lb

## 2017-12-02 DIAGNOSIS — E785 Hyperlipidemia, unspecified: Secondary | ICD-10-CM

## 2017-12-02 DIAGNOSIS — I10 Essential (primary) hypertension: Secondary | ICD-10-CM

## 2017-12-02 DIAGNOSIS — I480 Paroxysmal atrial fibrillation: Secondary | ICD-10-CM

## 2017-12-02 NOTE — Patient Instructions (Addendum)

## 2017-12-10 ENCOUNTER — Ambulatory Visit
Admission: RE | Admit: 2017-12-10 | Discharge: 2017-12-10 | Disposition: A | Discharge status: Home / Self Care | Payer: Medicare Other | Source: Ambulatory Visit | Attending: Internal Medicine | Admitting: Internal Medicine

## 2017-12-10 ENCOUNTER — Telehealth: Payer: Self-pay | Admitting: Internal Medicine

## 2017-12-10 DIAGNOSIS — R922 Inconclusive mammogram: Secondary | ICD-10-CM | POA: Diagnosis not present

## 2017-12-10 DIAGNOSIS — Z9889 Other specified postprocedural states: Secondary | ICD-10-CM

## 2017-12-10 HISTORY — DX: Personal history of irradiation: Z92.3

## 2017-12-10 NOTE — Telephone Encounter (Signed)
Attempted to call patient to schedule AWV. Mrs. Bober did not answer. Could not leave voicemail. Will try to call patient again at a later time. SF

## 2017-12-21 ENCOUNTER — Other Ambulatory Visit: Payer: Self-pay | Admitting: Internal Medicine

## 2017-12-21 ENCOUNTER — Other Ambulatory Visit: Payer: Self-pay | Admitting: Hematology and Oncology

## 2017-12-23 NOTE — Assessment & Plan Note (Signed)
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 N0 M0 Oncotype DX recurrence score 15, 9% ROR Currently on anastrozole 1 mg daily started 01/25/2015.  Anastrozole toxicities: Patient denies any problems or concerns taking Anastrozole. She denies any hot flashes or myalgias. Breast cancer surveillance: 1. Breast exam 12/24/17 has evidence of recent biopsy with a small hematoma or bruising. Breast biopsy on 12/19/2016 was benign. 2. Mammograms 10/15/2017: Calcifications biopsy: Benign  Right flank pain: resolved with a spine injection.  Return to clinic in 1 yrfor follow up

## 2017-12-24 ENCOUNTER — Telehealth: Payer: Self-pay | Admitting: Hematology and Oncology

## 2017-12-24 ENCOUNTER — Inpatient Hospital Stay: Payer: Medicare Other | Attending: Hematology and Oncology | Admitting: Hematology and Oncology

## 2017-12-24 DIAGNOSIS — Z170 Estrogen receptor positive status [ER+]: Secondary | ICD-10-CM | POA: Insufficient documentation

## 2017-12-24 DIAGNOSIS — I4891 Unspecified atrial fibrillation: Secondary | ICD-10-CM

## 2017-12-24 DIAGNOSIS — C50412 Malignant neoplasm of upper-outer quadrant of left female breast: Secondary | ICD-10-CM | POA: Diagnosis not present

## 2017-12-24 MED ORDER — ANASTROZOLE 1 MG PO TABS: 1.0000 mg | ORAL_TABLET | Freq: Every day | ORAL | 3 refills | Status: DC

## 2017-12-24 NOTE — Telephone Encounter (Signed)
Gave patient avs and calendar with appts per 1/29 los.  °

## 2017-12-24 NOTE — Progress Notes (Signed)
Patient Care Team: Colon Branch, MD as PCP - General [name], MD as Consulting Physician (Hematology and Oncology) [name], MD as Consulting Physician (General Surgery) Marylynn Pearson, MD as Consulting Physician (Obstetrics and Gynecology) Irene Shipper, MD as Consulting Physician (Gastroenterology) Gery Pray, MD as Consulting Physician (Radiation Oncology) Druscilla Brownie, MD as Consulting Physician (Dermatology)  DIAGNOSIS:  Encounter Diagnosis  Name Primary?  . 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  She is tolerating anastrozole extremely well.  She does not have any hot flashes or myalgias.  She denies any lumps or nodules in the breast.   REVIEW OF SYSTEMS:   Constitutional: Denies fevers, chills or abnormal weight loss Eyes: Denies blurriness of vision Ears, nose, mouth, throat, and face: Denies mucositis or sore throat Respiratory: Denies cough, dyspnea or wheezes Cardiovascular: Denies palpitation, chest discomfort Gastrointestinal:  Denies nausea, heartburn or change in bowel habits Skin: Denies abnormal skin rashes Lymphatics: Denies new lymphadenopathy or easy  bruising Neurological:Denies numbness, tingling or new weaknesses Behavioral/Psych: Mood is stable, no new changes  Extremities: No lower extremity edema Breast:  denies any pain or lumps or nodules in either breasts All other systems were reviewed with the patient and are negative.  I have reviewed the past medical history, past surgical history, social history and family history with the patient and they are unchanged from previous note.  ALLERGIES:  is allergic to codeine; hyaluronic acid  [collagen-chond-hyaluronic acid]; carvedilol; felodipine; lisinopril; losartan potassium; and radiaplexrx [pyridoxine-zinc picolinate].  MEDICATIONS:  Current Outpatient Medications  Medication Sig Dispense Refill  . anastrozole (ARIMIDEX) 1 MG tablet TAKE 1 TABLET (1 MG TOTAL) BY MOUTH DAILY. 90 tablet 3  . calcium carbonate 1250 MG capsule Take 2,500 mg by mouth every morning.     . cholecalciferol (VITAMIN D) 1000 UNITS tablet Take 2,500 Units by mouth every morning.     . dorzolamide-timolol (COSOPT) 22.3-6.8 MG/ML ophthalmic solution Place 1 drop into both eyes every morning.     . hydrochlorothiazide (HYDRODIURIL) 25 MG tablet Take 1 tablet (25 mg total) by mouth daily. 30 tablet 5  . latanoprost (XALATAN) 0.005 % ophthalmic solution PLACE 1 DROP INTO BOTH EYES NIGHTLY.    Marland Kitchen levothyroxine (SYNTHROID, LEVOTHROID) 25 MCG tablet Take 1 tablet (25 mcg total) by mouth daily before breakfast. 90 tablet 1  . metoprolol tartrate (LOPRESSOR) 25 MG tablet Take 1 tablet (25 mg total) by mouth 2 (two) times daily. 180 tablet 2  . Multiple Vitamin (MULTIVITAMIN) tablet Take 1 tablet by mouth every morning.     . olmesartan (BENICAR) 40 MG tablet Take 1 tablet (40 mg total) daily  by mouth. 90 tablet 1  . pravastatin (PRAVACHOL) 40 MG tablet Take 1 tablet (40 mg total) by mouth daily. 90 tablet 1  . rivaroxaban (XARELTO) 20 MG TABS tablet Take 1 tablet (20 mg total) by mouth daily with supper. 90 tablet 0    Current Facility-Administered Medications  Medication Dose Route Frequency Provider Last Rate Last Dose  . 0.9 %  sodium chloride infusion  500 mL Intravenous Continuous Irene Shipper, MD        PHYSICAL EXAMINATION: ECOG PERFORMANCE STATUS: 0 - Asymptomatic  Vitals:   12/24/17 1020  BP: (!) 144/63  Pulse: (!) 55  Resp: 18  Temp: 98.6 F (37 C)  SpO2: 97%   Filed Weights   12/24/17 1020  Weight: 225 lb 3.2 oz (102.2 kg)    GENERAL:alert, no distress and comfortable SKIN: skin color, texture, turgor are normal, no rashes or significant lesions EYES: normal, Conjunctiva are pink and non-injected, sclera clear OROPHARYNX:no exudate, no erythema and lips, buccal mucosa, and tongue normal  NECK: supple, thyroid normal size, non-tender, without nodularity LYMPH:  no palpable lymphadenopathy in the cervical, axillary or inguinal LUNGS: clear to auscultation and percussion with normal breathing effort HEART: Irregular heart rate from atrial fibrillation ABDOMEN:abdomen soft, non-tender and normal bowel sounds MUSCULOSKELETAL:no cyanosis of digits and no clubbing  NEURO: alert & oriented x 3 with fluent speech, no focal motor/sensory deficits EXTREMITIES: No lower extremity edema BREAST: No palpable masses or nodules in either right or left breasts. No palpable axillary supraclavicular or infraclavicular adenopathy no breast tenderness or nipple discharge. (exam performed in the presence of a chaperone)  LABORATORY DATA:  I have reviewed the data as listed CMP Latest Ref Rng & Units 10/15/2017 09/25/2017 07/16/2017  Glucose 70 - 99 mg/dL 146(H) - 107(H)  BUN 6 - 23 mg/dL 26(H) - 16  Creatinine 0.40 - 1.20 mg/dL 1.00 - 0.88  Sodium 135 - 145 mEq/L 139 - 138  Potassium 3.5 - 5.1 mEq/L 3.8 - 4.0  Chloride 96 - 112 mEq/L 105 - 104  CO2 19 - 32 mEq/L 26 - 30  Calcium 8.4 - 10.5 mg/dL 9.4 - 9.4  Total Protein 6.0 - 8.3 g/dL - - -  Total Bilirubin 0.2 - 1.2 mg/dL - - -  Alkaline  Phos 39 - 117 U/L - - -  AST 0 - 37 U/L - 17 -  ALT 0 - 35 U/L - 14 -    Lab Results  Component Value Date   WBC 4.5 07/16/2017   HGB 12.9 07/16/2017   HCT 38.6 07/16/2017   MCV 95.2 07/16/2017   PLT 177.0 07/16/2017   NEUTROABS 2.9 07/16/2017    ASSESSMENT & PLAN:  Breast cancer of upper-outer quadrant of left female breast Left breast lumpectomy 09/14/2014: 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 N0 M0 Oncotype DX recurrence score 15, 9% ROR Currently on anastrozole 1 mg daily started 01/25/2015.  Anastrozole toxicities: Patient denies any problems or concerns taking Anastrozole. She denies any hot flashes or myalgias. Breast cancer surveillance: 1. Breast exam 12/24/17  no palpable lumps or nodules.  Breast biopsy on 12/19/2016 was benign. 2. Mammograms 10/15/2017: Calcifications biopsy: Benign Her primary care physician has ordered a bone density test.  I instructed the patient to go and have the test  New onset atrial fibrillation: Currently on cardiac medications on Xarelto. I discussed with her about  importance of exercise and losing weight.  Instructed her to go to a gym.  She tells me that she is retired and goes to shopping for exercise.  Return to clinic in 1 yrfor follow up  I spent 25 minutes talking to the patient of which more than half was spent in counseling and coordination of care.  No orders of the defined types were placed in this encounter.  The patient has a good understanding of the overall plan. she agrees with it. she will call with any problems that may develop before the next visit here.   Harriette Ohara, MD 12/24/17

## 2017-12-26 DIAGNOSIS — R87612 Low grade squamous intraepithelial lesion on cytologic smear of cervix (LGSIL): Secondary | ICD-10-CM | POA: Diagnosis not present

## 2017-12-26 DIAGNOSIS — Z01419 Encounter for gynecological examination (general) (routine) without abnormal findings: Secondary | ICD-10-CM | POA: Diagnosis not present

## 2017-12-26 DIAGNOSIS — Z124 Encounter for screening for malignant neoplasm of cervix: Secondary | ICD-10-CM | POA: Diagnosis not present

## 2018-03-03 ENCOUNTER — Other Ambulatory Visit: Payer: Self-pay | Admitting: Internal Medicine

## 2018-03-04 ENCOUNTER — Telehealth: Payer: Self-pay | Admitting: Internal Medicine

## 2018-03-04 MED ORDER — RIVAROXABAN 20 MG PO TABS: 20.0000 mg | ORAL_TABLET | Freq: Every day | ORAL | 1 refills | Status: DC

## 2018-03-04 NOTE — Telephone Encounter (Signed)
Copied from [place] [id number]. Topic: Quick Communication - Rx Refill/Question >> Mar 04, 2018  8:38 AM Lennox Solders wrote: Medication: rivaroxaban Has the patient contacted their pharmacy yes [facility]erred [facility] [address]. Pt has one pill left for today

## 2018-03-21 DIAGNOSIS — H40023 Open angle with borderline findings, high risk, bilateral: Secondary | ICD-10-CM | POA: Diagnosis not present

## 2018-03-25 ENCOUNTER — Encounter: Payer: Self-pay | Admitting: Internal Medicine

## 2018-03-25 ENCOUNTER — Ambulatory Visit (INDEPENDENT_AMBULATORY_CARE_PROVIDER_SITE_OTHER): Payer: Medicare Other | Admitting: Internal Medicine

## 2018-03-25 VITALS — BP 126/80 | HR 53 | Temp 98.1°F | Resp 14 | Ht 63.0 in | Wt 225.2 lb

## 2018-03-25 DIAGNOSIS — I10 Essential (primary) hypertension: Secondary | ICD-10-CM

## 2018-03-25 DIAGNOSIS — D34 Benign neoplasm of thyroid gland: Secondary | ICD-10-CM | POA: Diagnosis not present

## 2018-03-25 DIAGNOSIS — E039 Hypothyroidism, unspecified: Secondary | ICD-10-CM | POA: Diagnosis not present

## 2018-03-25 DIAGNOSIS — Z780 Asymptomatic menopausal state: Secondary | ICD-10-CM | POA: Diagnosis not present

## 2018-03-25 NOTE — Progress Notes (Signed)
Pre visit review using our clinic review tool, if applicable. No additional management support is needed unless otherwise documented below in the visit note. 

## 2018-03-25 NOTE — Progress Notes (Signed)
Subjective:    Patient ID: Lori Joseph, female    DOB: 06/18/1941, 77 y.o.   MRN: [id number]  DOS:  03/25/2018 Type of visit - description : Follow-up Interval history: Atrial fibrillation: Good compliance with medications, no symptoms HTN: Ambulatory BPs when check are normal, could not tell me any readings Osteopenia: Chart reviewed, due for a bone density test Obesity, concerned about her weight   Review of Systems Reports no chest pain, shortness of breath, palpitation or lower extremity edema  Past Medical History:  Diagnosis Date  . Allergic rhinitis   . Anemia   . Atrial fibrillation ([place]) 03/2017  . Family history of anesthesia complication    daughter hard to wake up  . Glaucoma   . H/O acute pancreatitis   . H/O retinal detachment   . Hurthle cell neoplasm of thyroid    Right   . HYPERLIPIDEMIA   . Hypertension   . Hypothyroidism 05/31/2015  . OSTEOPENIA   . Personal history of radiation therapy 2015  . Primary cancer of upper outer quadrant of left female breast ([place])    Dr. Lindi Adie  . Radiation 11/11/15-12/30/14   left breast 50.4 gray, lumpectomy cavity boosted to 62.4 gray    Past Surgical History:  Procedure Laterality Date  . BREAST BIOPSY Left 08/27/14  . BREAST BIOPSY Left 11/27/2016  . BREAST LUMPECTOMY Left 09/15/2014  . CHOLECYSTECTOMY  2000  . DILATION AND CURETTAGE OF UTERUS    . laser eye surgery, detached retina Left   . RADIOACTIVE SEED GUIDED PARTIAL MASTECTOMY WITH AXILLARY SENTINEL LYMPH NODE BIOPSY Left 09/16/2014   Procedure: RADIOACTIVE SEED GUIDED PARTIAL MASTECTOMY WITH AXILLARY SENTINEL LYMPH NODE BIOPSY;  Surgeon: [name], MD;  Location: [facility];  Service: General;  Laterality: Left;  . thyoidectomy Left    partial  . THYROID LOBECTOMY N/A 02/10/2015   Procedure: RIGHT THYROID LOBECTOMY;  Surgeon: [name], MD;  Location: [facility];  Service: General;  Laterality: N/A;  . TONSILLECTOMY    . TUBAL LIGATION        Social History   Socioeconomic History  . Marital status: Married    Spouse name: Ruthann Cancer  . Number of children: 2  . Years of education: Not on file  . Highest education level: Not on file  Occupational History  . Occupation: RETIRED-- Retail banker: [employer]  Social Needs  . Financial resource strain: Not on file  . Food insecurity:    Worry: Not on file    Inability: Not on file  . Transportation needs:    Medical: Not on file    Non-medical: Not on file  Tobacco Use  . Smoking status: Never Smoker  . Smokeless tobacco: Never Used  Substance and Sexual Activity  . Alcohol use: No    Alcohol/week: 0.0 oz  . Drug use: No  . Sexual activity: Not on file  Lifestyle  . Physical activity:    Days per week: Not on file    Minutes per session: Not on file  . Stress: Not on file  Relationships  . Social connections:    Talks on phone: Not on file    Gets together: Not on file    Attends religious service: Not on file    Active member of club or organization: Not on file    Attends meetings of clubs or organizations: Not on file    Relationship status: Not on file  . Intimate partner  violence:    Fear of current or ex partner: Not on file    Emotionally abused: Not on file    Physically abused: Not on file    Forced sexual activity: Not on file  Other Topics Concern  . Not on file  Social History Narrative   Lives w/ husband      Allergies as of 03/25/2018      Reactions   Codeine Anaphylaxis   Hyaluronic Acid  [collagen-chond-hyaluronic Acid] Itching, Rash   Carvedilol    REACTION: leg pain, edema   Felodipine    REACTION: HA, insomnia   Lisinopril    REACTION: HA, SWELLING   Losartan Potassium    REACTION: cough   Radiaplexrx [pyridoxine-zinc Picolinate] Itching, Rash      Medication List        Accurate as of 03/25/18 11:59 PM. Always use your most recent med list.          anastrozole 1 MG tablet Commonly known  as:  ARIMIDEX Take 1 tablet (1 mg total) by mouth daily.   calcium carbonate 1250 MG capsule Take 2,500 mg by mouth every morning.   cholecalciferol 1000 units tablet Commonly known as:  VITAMIN D Take 2,500 Units by mouth every morning.   dorzolamide-timolol 22.3-6.8 MG/ML ophthalmic solution Commonly known as:  COSOPT Place 1 drop into both eyes every morning.   hydrochlorothiazide 25 MG tablet Commonly known as:  HYDRODIURIL Take 1 tablet (25 mg total) by mouth daily.   latanoprost 0.005 % ophthalmic solution Commonly known as:  XALATAN PLACE 1 DROP INTO BOTH EYES NIGHTLY.   levothyroxine 25 MCG tablet Commonly known as:  SYNTHROID, LEVOTHROID Take 1 tablet (25 mcg total) by mouth daily before breakfast.   metoprolol tartrate 25 MG tablet Commonly known as:  LOPRESSOR Take 1 tablet (25 mg total) by mouth 2 (two) times daily.   multivitamin tablet Take 1 tablet by mouth every morning.   olmesartan 40 MG tablet Commonly known as:  BENICAR Take 1 tablet (40 mg total) daily by mouth.   pravastatin 40 MG tablet Commonly known as:  PRAVACHOL Take 1 tablet (40 mg total) by mouth daily.   rivaroxaban 20 MG Tabs tablet Commonly known as:  XARELTO Take 1 tablet (20 mg total) by mouth daily with supper.          Objective:   Physical Exam BP 126/80 (BP Location: Right Arm, Patient Position: Sitting, Cuff Size: Normal)   Pulse (!) 53   Temp 98.1 F (36.7 C) (Oral)   Resp 14   Ht 5\' 3"  (1.6 m)   Wt 225 lb 4 oz (102.2 kg)   LMP  (LMP Unknown)   SpO2 91%   BMI 39.90 kg/m  General:   Well developed, obese appearing female. NAD.  HEENT:  Normocephalic . Face symmetric, atraumatic Neck: No mass.  No LAD Lungs:  CTA B Normal respiratory effort, no intercostal retractions, no accessory muscle use. Heart: seems regular today,  no murmur.  No pretibial edema bilaterally  Skin: Not pale. Not jaundice Neurologic:  alert & oriented X3.  Speech normal, gait  appropriate for age and unassisted Psych--  Cognition and judgment appear intact.  Cooperative with normal attention span and concentration.  Behavior appropriate. No anxious or depressed appearing.      Assessment & Plan:   Assessment  HTN Hyperlipidemia Hypothyroidism Osteopenia: T score 2010 normal,  T score 05-2012 (-1.2), on calcium and vitamin D CV: -A. fib, new onset  03-2017 -Low risk stress test 04-2017 Oncology: --Breast cancer, left, dx  2015, XRT 10/2015 to 12/2014, on Arimidex --Hurthle Cell neoplasm of the thyroid, thyroidectomy 01-2015 SKIN: ---Eczema ---[place] 2012, Dr Allyson Sabal ANEMIA- saw hematology, rx IV iron 10-2015 Chronic right-sided chest pain:  x-rays CT abdomen and pelvis 2016 negative, saw pain mngmt, ortho ---> better with Lidoderm patch and a local injection in the back Glaucoma   H/o acute pancreatitis H/o retinal  detachment Pulmonary: Abnormal chest x-ray and CT chest: Last CT chest 01-2016: See report. CXR 03/27/2016: No acute. Pulmonary: f/u prn ++ FH Lung Ca (pt is not a smoker)  PLAN: HTN: Seems controlled on HCTZ, Lopressor, Benicar.  Check a BMP Hypothyroidism: On Synthroid, check a TSH History of thyroid neoplasm: Exam is benign, check ultrasound Osteopenia: On calcium, vitamin D, check a bone density test Obesity: Interested in weight loss, recommend to see one of the bariatric clinics in town RTC 10-19 CPX

## 2018-03-25 NOTE — Patient Instructions (Signed)
GO TO THE LAB : Get the blood work     GO TO THE FRONT DESK Schedule your next appointment for a   physical exam by October 2019 

## 2018-03-26 LAB — BASIC METABOLIC PANEL
BUN: 26 mg/dL — ABNORMAL HIGH (ref 6–23)
CALCIUM: 9.5 mg/dL (ref 8.4–10.5)
CO2: 26 mEq/L (ref 19–32)
Chloride: 106 mEq/L (ref 96–112)
Creatinine, Ser: 1.05 mg/dL (ref 0.40–1.20)
GFR: 54.03 mL/min — AB (ref >60.00)
Glucose, Bld: 89 mg/dL (ref 70–99)
Potassium: 4.1 mEq/L (ref 3.5–5.1)
SODIUM: 141 meq/L (ref 135–145)

## 2018-03-26 LAB — TSH: TSH: 3.11 u[IU]/mL (ref 0.35–4.50)

## 2018-03-26 NOTE — Assessment & Plan Note (Signed)
HTN: Seems controlled on HCTZ, Lopressor, Benicar.  Check a BMP Hypothyroidism: On Synthroid, check a TSH History of thyroid neoplasm: Exam is benign, check ultrasound Osteopenia: On calcium, vitamin D, check a bone density test Obesity: Interested in weight loss, recommend to see one of the bariatric clinics in town RTC 10-19 CPX

## 2018-03-28 ENCOUNTER — Telehealth: Payer: Self-pay | Admitting: Internal Medicine

## 2018-03-28 MED ORDER — ACYCLOVIR 5 % EX OINT: 1.0000 "application " | TOPICAL_OINTMENT | CUTANEOUS | 0 refills | Status: DC

## 2018-03-28 NOTE — Telephone Encounter (Signed)
Copied from [place] [phone]. Topic: Quick Communication - See Telephone Encounter >> Mar 28, 2018 10:05 AM Aurelio Brash B wrote: CRM for notification. See Telephone encounter for: 03/28/18. PT is requesting lab results

## 2018-03-28 NOTE — Telephone Encounter (Signed)
Spoke w/ Pt- informed of lab results. Results also released to [place]. Pt verbalized understanding.

## 2018-03-28 NOTE — Telephone Encounter (Signed)
Copied from [place] [phone]. Topic: Quick Communication - See Telephone Encounter >> Mar 28, 2018 10:24 AM Vernona Rieger wrote: CRM for notification. See Telephone encounter for: 03/28/18.  Patient said that she woke up yesterday 5/2 with a fever blister. She said she has tried OTC creams and they are not working. She wants to know if Dr Larose Kells will call her in a prescription cream.   [id number] [place], [place].

## 2018-03-28 NOTE — Telephone Encounter (Signed)
Please advise 

## 2018-03-28 NOTE — Telephone Encounter (Signed)
Advise pt, labs look good

## 2018-03-28 NOTE — Telephone Encounter (Signed)
Spoke w/ Pt, informed that ointment has been sent- she is to let us know if not improving.

## 2018-03-28 NOTE — Telephone Encounter (Signed)
Prescription for Zovirax sent, call if not gradually better, use the ointment for the next 5 days

## 2018-03-31 ENCOUNTER — Ambulatory Visit (HOSPITAL_BASED_OUTPATIENT_CLINIC_OR_DEPARTMENT_OTHER)
Admission: RE | Admit: 2018-03-31 | Discharge: 2018-03-31 | Disposition: A | Discharge status: Home / Self Care | Payer: Medicare Other | Source: Ambulatory Visit | Attending: Internal Medicine | Admitting: Internal Medicine

## 2018-03-31 DIAGNOSIS — D34 Benign neoplasm of thyroid gland: Secondary | ICD-10-CM | POA: Diagnosis not present

## 2018-03-31 DIAGNOSIS — M85852 Other specified disorders of bone density and structure, left thigh: Secondary | ICD-10-CM | POA: Diagnosis not present

## 2018-03-31 DIAGNOSIS — Z780 Asymptomatic menopausal state: Secondary | ICD-10-CM | POA: Insufficient documentation

## 2018-03-31 DIAGNOSIS — M85851 Other specified disorders of bone density and structure, right thigh: Secondary | ICD-10-CM | POA: Diagnosis not present

## 2018-03-31 DIAGNOSIS — E042 Nontoxic multinodular goiter: Secondary | ICD-10-CM | POA: Diagnosis not present

## 2018-04-07 ENCOUNTER — Other Ambulatory Visit (HOSPITAL_BASED_OUTPATIENT_CLINIC_OR_DEPARTMENT_OTHER): Payer: Medicare Other

## 2018-04-09 DIAGNOSIS — H40023 Open angle with borderline findings, high risk, bilateral: Secondary | ICD-10-CM | POA: Diagnosis not present

## 2018-04-09 DIAGNOSIS — H2513 Age-related nuclear cataract, bilateral: Secondary | ICD-10-CM | POA: Diagnosis not present

## 2018-04-14 ENCOUNTER — Telehealth: Payer: Self-pay | Admitting: Internal Medicine

## 2018-04-22 MED ORDER — OLMESARTAN MEDOXOMIL 20 MG PO TABS: 40.0000 mg | ORAL_TABLET | Freq: Every day | ORAL | 1 refills | Status: DC

## 2018-04-22 NOTE — Telephone Encounter (Signed)
Patient states that her pharmacy will not be manufacturing olmesartan (BENICAR) [facility] tablet [facility] [address] She said they have the 20mg  if she could take that and just double it. She said that no pharmacy's in [place] have the 40mg . Please advise

## 2018-04-22 NOTE — Addendum Note (Signed)
Addended byDamita Dunnings D on: 04/22/2018 12:55 PM   Modules accepted: Orders

## 2018-04-22 NOTE — Telephone Encounter (Signed)
Rx for olmesartan 20mg  2 tabs [facility]

## 2018-04-22 NOTE — Telephone Encounter (Signed)
Ok to do olmesartan 20 mg 2 po qd, send a RX

## 2018-04-22 NOTE — Telephone Encounter (Signed)
Patient is going out of town tomorrow and asked if it could be done by tomorrow

## 2018-04-22 NOTE — Telephone Encounter (Signed)
Please advise 

## 2018-04-28 DIAGNOSIS — E041 Nontoxic single thyroid nodule: Secondary | ICD-10-CM | POA: Diagnosis not present

## 2018-04-28 DIAGNOSIS — C50412 Malignant neoplasm of upper-outer quadrant of left female breast: Secondary | ICD-10-CM | POA: Diagnosis not present

## 2018-04-28 DIAGNOSIS — D34 Benign neoplasm of thyroid gland: Secondary | ICD-10-CM | POA: Diagnosis not present

## 2018-04-30 ENCOUNTER — Telehealth: Payer: Self-pay | Admitting: Internal Medicine

## 2018-04-30 DIAGNOSIS — I10 Essential (primary) hypertension: Secondary | ICD-10-CM

## 2018-04-30 NOTE — Telephone Encounter (Signed)
Copied from [place] [phone]. Topic: Quick Communication - See Telephone Encounter >> Apr 30, 2018  2:12 PM Conception Chancy, NT wrote: CRM for notification. See Telephone encounter for: 04/30/18.  Patient is calling and states that her pharmacy only gave her 2 weeks worth of olmesartan (BENICAR) 20 MG tablet. States the manufacture is not going to be making this anymore and she needs something else called in.  [id number] [address], [place] [address] Phone: [phone] Fax: [phone]

## 2018-05-05 NOTE — Telephone Encounter (Signed)
Pt calling to check status on her olmesartan. Pt would Kaylan regarding her medication.

## 2018-05-06 MED ORDER — TELMISARTAN 40 MG PO TABS: 40.0000 mg | ORAL_TABLET | Freq: Every day | ORAL | 1 refills | Status: DC

## 2018-05-06 NOTE — Telephone Encounter (Signed)
Tried calling Pt- no answer- unable to leave voice message. MyChart message sent to Pt informing of medication change and to call office to schedule lab appt to be completed in 2 weeks after she switches medications. BMP ordered.

## 2018-05-06 NOTE — Telephone Encounter (Signed)
Please advise 

## 2018-05-06 NOTE — Telephone Encounter (Signed)
Cannot take losartan or lisinopril. Recommend telmisartan 40 mg 1 tablet daily #30 and 1 refill. Recommend to check BPs to be sure they remain well controlled. Check on BMP 2 weeks after she is switched.

## 2018-05-29 ENCOUNTER — Other Ambulatory Visit: Payer: Self-pay | Admitting: Internal Medicine

## 2018-06-13 ENCOUNTER — Other Ambulatory Visit: Payer: Self-pay | Admitting: Internal Medicine

## 2018-06-29 ENCOUNTER — Other Ambulatory Visit: Payer: Self-pay | Admitting: Internal Medicine

## 2018-07-07 ENCOUNTER — Other Ambulatory Visit: Payer: Self-pay | Admitting: Internal Medicine

## 2018-07-07 MED ORDER — RIVAROXABAN 20 MG PO TABS: 20.0000 mg | ORAL_TABLET | Freq: Every day | ORAL | 1 refills | Status: DC

## 2018-07-07 NOTE — Telephone Encounter (Signed)
Patient is requesting a 17month supply of  rivaroxaban (XARELTO) 20 MG TABS tablet   Preferred Pharmacy  [id number] [address], [place]. [phone] (Phone) [phone] (Fax)

## 2018-07-07 NOTE — Telephone Encounter (Signed)
Rx sent 

## 2018-07-07 NOTE — Telephone Encounter (Signed)
Copied from [place] [phone]. Topic: Quick Communication - Rx Refill/Question >> Jul 07, 2018 12:51 PM Neva Seat wrote: Medication: ***  Has the patient contacted their pharmacy?  (Agent: If no, request that the patient contact the pharmacy for the refill.) (Agent: If yes, when and what did the pharmacy advise?)  Preferred Pharmacy (with phone number or street name): ***  Agent: Please be advised that RX refills may take up to 3 business days. We ask that you follow-up with your pharmacy.

## 2018-07-07 NOTE — Telephone Encounter (Signed)
What is needed?

## 2018-07-24 DIAGNOSIS — M5442 Lumbago with sciatica, left side: Secondary | ICD-10-CM | POA: Diagnosis not present

## 2018-08-07 DIAGNOSIS — M7062 Trochanteric bursitis, left hip: Secondary | ICD-10-CM | POA: Diagnosis not present

## 2018-08-19 ENCOUNTER — Encounter: Payer: Self-pay | Admitting: Internal Medicine

## 2018-08-19 ENCOUNTER — Ambulatory Visit (HOSPITAL_BASED_OUTPATIENT_CLINIC_OR_DEPARTMENT_OTHER)
Admission: RE | Admit: 2018-08-19 | Discharge: 2018-08-19 | Disposition: A | Discharge status: Home / Self Care | Payer: Medicare Other | Source: Ambulatory Visit | Attending: Internal Medicine | Admitting: Internal Medicine

## 2018-08-19 ENCOUNTER — Ambulatory Visit (INDEPENDENT_AMBULATORY_CARE_PROVIDER_SITE_OTHER): Payer: Medicare Other | Admitting: Internal Medicine

## 2018-08-19 VITALS — BP 136/70 | HR 55 | Temp 97.5°F | Resp 16 | Ht 63.0 in | Wt 221.5 lb

## 2018-08-19 DIAGNOSIS — E890 Postprocedural hypothyroidism: Secondary | ICD-10-CM

## 2018-08-19 DIAGNOSIS — R5383 Other fatigue: Secondary | ICD-10-CM | POA: Insufficient documentation

## 2018-08-19 DIAGNOSIS — R0609 Other forms of dyspnea: Secondary | ICD-10-CM

## 2018-08-19 DIAGNOSIS — R739 Hyperglycemia, unspecified: Secondary | ICD-10-CM | POA: Diagnosis not present

## 2018-08-19 DIAGNOSIS — R0602 Shortness of breath: Secondary | ICD-10-CM | POA: Insufficient documentation

## 2018-08-19 DIAGNOSIS — Z23 Encounter for immunization: Secondary | ICD-10-CM

## 2018-08-19 NOTE — Progress Notes (Signed)
Subjective:    Patient ID: Lori Joseph, female    DOB: January 16, 1941, 77 y.o.   MRN: [id number]  DOS:  08/19/2018 Type of visit - description : acute Interval history: Her main concern is fatigue, states that she is "really tired". Symptoms of started about 3 or 4 weeks ago, has good days and bad days. The only thing different is that she went to see orthopedic surgery with left lower back pain, had injection there, that is better.   Review of Systems  Denies fever or chills No headaches No nausea, vomiting, diarrhea.  No blood in the stools No problems with depression or anxiety at this point Denies chest pain, palpitations, edema or orthopnea. I asked about DOE  and she feels that she is getting more tired when she go upstairs and question of mild shortness of breath when she walks more than usual. Denies of snoring or feeling sleepy No cough or wheezing.  Past Medical History:  Diagnosis Date  . Allergic rhinitis   . Anemia   . Atrial fibrillation ([place]) 03/2017  . Family history of anesthesia complication    daughter hard to wake up  . Glaucoma   . H/O acute pancreatitis   . H/O retinal detachment   . Hurthle cell neoplasm of thyroid    Right   . HYPERLIPIDEMIA   . Hypertension   . Hypothyroidism 05/31/2015  . OSTEOPENIA   . Personal history of radiation therapy 2015  . Primary cancer of upper outer quadrant of left female breast ([place])    Dr. Lindi Adie  . Radiation 11/11/15-12/30/14   left breast 50.4 gray, lumpectomy cavity boosted to 62.4 gray    Past Surgical History:  Procedure Laterality Date  . BREAST BIOPSY Left 08/27/14  . BREAST BIOPSY Left 11/27/2016  . BREAST LUMPECTOMY Left 09/15/2014  . CHOLECYSTECTOMY  2000  . DILATION AND CURETTAGE OF UTERUS    . laser eye surgery, detached retina Left   . RADIOACTIVE SEED GUIDED PARTIAL MASTECTOMY WITH AXILLARY SENTINEL LYMPH NODE BIOPSY Left 09/16/2014   Procedure: RADIOACTIVE SEED GUIDED PARTIAL MASTECTOMY WITH  AXILLARY SENTINEL LYMPH NODE BIOPSY;  Surgeon: [name], MD;  Location: [facility];  Service: General;  Laterality: Left;  . thyoidectomy Left    partial  . THYROID LOBECTOMY N/A 02/10/2015   Procedure: RIGHT THYROID LOBECTOMY;  Surgeon: [name], MD;  Location: [facility];  Service: General;  Laterality: N/A;  . TONSILLECTOMY    . TUBAL LIGATION      Social History   Socioeconomic History  . Marital status: Married    Spouse name: Ruthann Cancer  . Number of children: 2  . Years of education: Not on file  . Highest education level: Not on file  Occupational History  . Occupation: RETIRED-- Retail banker: [employer]  Social Needs  . Financial resource strain: Not on file  . Food insecurity:    Worry: Not on file    Inability: Not on file  . Transportation needs:    Medical: Not on file    Non-medical: Not on file  Tobacco Use  . Smoking status: Never Smoker  . Smokeless tobacco: Never Used  Substance and Sexual Activity  . Alcohol use: No    Alcohol/week: 0.0 standard drinks  . Drug use: No  . Sexual activity: Not on file  Lifestyle  . Physical activity:    Days per week: Not on file    Minutes per session: Not  on file  . Stress: Not on file  Relationships  . Social connections:    Talks on phone: Not on file    Gets together: Not on file    Attends religious service: Not on file    Active member of club or organization: Not on file    Attends meetings of clubs or organizations: Not on file    Relationship status: Not on file  . Intimate partner violence:    Fear of current or ex partner: Not on file    Emotionally abused: Not on file    Physically abused: Not on file    Forced sexual activity: Not on file  Other Topics Concern  . Not on file  Social History Narrative   Lives w/ husband      Allergies as of 08/19/2018      Reactions   Codeine Anaphylaxis   Hyaluronic Acid  [collagen-chond-hyaluronic Acid] Itching,  Rash   Carvedilol    REACTION: leg pain, edema   Felodipine    REACTION: HA, insomnia   Lisinopril    REACTION: HA, SWELLING   Losartan Potassium    REACTION: cough   Radiaplexrx [pyridoxine-zinc Picolinate] Itching, Rash      Medication List        Accurate as of 08/19/18 11:59 PM. Always use your most recent med list.          acyclovir ointment 5 % Commonly known as:  ZOVIRAX Apply 1 application topically every 3 (three) hours.   anastrozole 1 MG tablet Commonly known as:  ARIMIDEX Take 1 tablet (1 mg total) by mouth daily.   calcium carbonate 1250 MG capsule Take 2,500 mg by mouth every morning.   cholecalciferol 1000 units tablet Commonly known as:  VITAMIN D Take 2,500 Units by mouth every morning.   dorzolamide-timolol 22.3-6.8 MG/ML ophthalmic solution Commonly known as:  COSOPT Place 1 drop into both eyes every morning.   hydrochlorothiazide 25 MG tablet Commonly known as:  HYDRODIURIL Take 1 tablet (25 mg total) by mouth daily.   latanoprost 0.005 % ophthalmic solution Commonly known as:  XALATAN PLACE 1 DROP INTO BOTH EYES NIGHTLY.   levothyroxine 25 MCG tablet Commonly known as:  SYNTHROID, LEVOTHROID Take 1 tablet (25 mcg total) by mouth daily before breakfast.   metoprolol tartrate 25 MG tablet Commonly known as:  LOPRESSOR Take 1 tablet (25 mg total) by mouth 2 (two) times daily.   multivitamin tablet Take 1 tablet by mouth every morning.   pravastatin 40 MG tablet Commonly known as:  PRAVACHOL Take 1 tablet (40 mg total) by mouth daily.   rivaroxaban 20 MG Tabs tablet Commonly known as:  XARELTO Take 1 tablet (20 mg total) by mouth daily with supper.   telmisartan 40 MG tablet Commonly known as:  MICARDIS Take 1 tablet (40 mg total) by mouth daily.          Objective:   Physical Exam BP 136/70 (BP Location: Right Arm, Patient Position: Sitting, Cuff Size: Normal)   Pulse (!) 55   Temp (!) 97.5 F (36.4 C) (Oral)   Resp 16    Ht 5\' 3"  (1.6 m)   Wt 221 lb 8 oz (100.5 kg)   LMP  (LMP Unknown)   SpO2 97%   BMI 39.24 kg/m  General:   Well developed, NAD, see BMI.  HEENT:  Normocephalic . Face symmetric, atraumatic. Neck: No JVD.  No thyromegaly.   Lungs:  CTA B Normal respiratory effort, no  intercostal retractions, no accessory muscle use. Heart: RRR,  no murmur.  no pretibial edema bilaterally  Abdomen:  Not distended, soft, non-tender. No rebound or rigidity.   Skin: Not pale. Not jaundice Neurologic:  alert & oriented X3.  Speech normal, gait appropriate for age and unassisted Psych--  Cognition and judgment appear intact.  Cooperative with normal attention span and concentration.  Behavior appropriate. No anxious or depressed appearing.     Assessment & Plan:   Assessment  HTN Hyperlipidemia Hypothyroidism Osteopenia: T score 2010 normal,  T score 05-2012 (-1.2), on calcium and vitamin D CV: -A. fib, new onset 03-2017 -Low risk stress test 04-2017 Oncology: --Breast cancer, left, dx  2015, XRT 10/2015 to 12/2014, on Arimidex --Hurthle Cell neoplasm of the thyroid, thyroidectomy 01-2015 SKIN: ---Eczema ---[place] 2012, Dr Allyson Sabal ANEMIA- saw hematology, rx IV iron 10-2015 Chronic right-sided chest pain:  x-rays CT abdomen and pelvis 2016 negative, saw pain mngmt, ortho ---> better with Lidoderm patch and a local injection in the back Glaucoma   H/o acute pancreatitis H/o retinal  detachment Pulmonary: Abnormal chest x-ray and CT chest: Last CT chest 01-2016: See report. CXR 03/27/2016: No acute. Pulmonary: f/u prn ++ FH Lung Ca (pt is not a smoker)  PLAN: Fatigue: As described above.  Review of system benign, physical exam benign.  Some DOE. EKG show sinus bradycardia, bradycardia is not new. Epworth scale 2 which is negative, no evidence of CHF on clinical grounds, no depression. Etiology not clear, will start w/u  by doing a chest x-ray, CMP, CBC, TSH.  Also A1c due to history of  hyperglycemia.  B1, J88, folic acid and vitamin D. Recommend to come back sooner than her next appointment in November if she is not feeling better particularly if she is more DOE. Follow-up November 5 as already scheduled.

## 2018-08-19 NOTE — Progress Notes (Signed)
Pre visit review using our clinic review tool, if applicable. No additional management support is needed unless otherwise documented below in the visit note. 

## 2018-08-19 NOTE — Patient Instructions (Addendum)
GO TO THE LAB : Get the blood work     STOP BY THE FIRST FLOOR:  get the XR    Will see you again in November, if you continue to feel unwell or you get more shortness of breath: Call anytime

## 2018-08-20 NOTE — Assessment & Plan Note (Signed)
Fatigue: As described above.  Review of system benign, physical exam benign.  Some DOE. EKG show sinus bradycardia, bradycardia is not new. Epworth scale 2 which is negative, no evidence of CHF on clinical grounds, no depression. Etiology not clear, will start w/u  by doing a chest x-ray, CMP, CBC, TSH.  Also A1c due to history of hyperglycemia.  B1, K08, folic acid and vitamin D. Recommend to come back sooner than her next appointment in November if she is not feeling better particularly if she is more DOE. Follow-up November 5 as already scheduled.

## 2018-09-30 ENCOUNTER — Encounter: Payer: Medicare Other | Admitting: Internal Medicine

## 2018-10-09 ENCOUNTER — Encounter: Payer: Self-pay | Admitting: Internal Medicine

## 2018-10-09 ENCOUNTER — Ambulatory Visit (INDEPENDENT_AMBULATORY_CARE_PROVIDER_SITE_OTHER): Payer: Medicare Other | Admitting: Internal Medicine

## 2018-10-09 VITALS — BP 134/70 | HR 56 | Temp 97.6°F | Resp 16 | Ht 63.0 in | Wt 222.4 lb

## 2018-10-09 DIAGNOSIS — M899 Disorder of bone, unspecified: Secondary | ICD-10-CM | POA: Diagnosis not present

## 2018-10-09 DIAGNOSIS — E890 Postprocedural hypothyroidism: Secondary | ICD-10-CM

## 2018-10-09 DIAGNOSIS — I4891 Unspecified atrial fibrillation: Secondary | ICD-10-CM

## 2018-10-09 DIAGNOSIS — Z0000 Encounter for general adult medical examination without abnormal findings: Secondary | ICD-10-CM | POA: Diagnosis not present

## 2018-10-09 DIAGNOSIS — Z8639 Personal history of other endocrine, nutritional and metabolic disease: Secondary | ICD-10-CM

## 2018-10-09 DIAGNOSIS — M7918 Myalgia, other site: Secondary | ICD-10-CM

## 2018-10-09 DIAGNOSIS — Z853 Personal history of malignant neoplasm of breast: Secondary | ICD-10-CM

## 2018-10-09 LAB — CBC WITH DIFFERENTIAL/PLATELET
BASOS PCT: 0.7 % (ref 0.0–3.0)
Basophils Absolute: 0 10*3/uL (ref 0.0–0.1)
EOS ABS: 0.1 10*3/uL (ref 0.0–0.7)
EOS PCT: 2 % (ref 0.0–5.0)
HCT: 35.5 % — ABNORMAL LOW (ref 36.0–46.0)
Hemoglobin: 11.9 g/dL — ABNORMAL LOW (ref 12.0–15.0)
LYMPHS ABS: 1.1 10*3/uL (ref 0.7–4.0)
Lymphocytes Relative: 25.4 % (ref 12.0–46.0)
MCHC: 33.4 g/dL (ref 30.0–36.0)
MCV: 94 fl (ref 78.0–100.0)
MONO ABS: 0.3 10*3/uL (ref 0.1–1.0)
Monocytes Relative: 6.7 % (ref 3.0–12.0)
NEUTROS PCT: 65.2 % (ref 43.0–77.0)
Neutro Abs: 2.9 10*3/uL (ref 1.4–7.7)
Platelets: 195 10*3/uL (ref 150.0–400.0)
RBC: 3.78 Mil/uL — ABNORMAL LOW (ref 3.87–5.11)
RDW: 13.6 % (ref 11.5–15.5)
WBC: 4.4 10*3/uL (ref 4.0–10.5)

## 2018-10-09 LAB — COMPREHENSIVE METABOLIC PANEL
ALBUMIN: 4 g/dL (ref 3.5–5.2)
ALT: 15 U/L (ref 0–35)
AST: 18 U/L (ref 0–37)
Alkaline Phosphatase: 88 U/L (ref 39–117)
BILIRUBIN TOTAL: 0.9 mg/dL (ref 0.2–1.2)
BUN: 22 mg/dL (ref 6–23)
CALCIUM: 9.4 mg/dL (ref 8.4–10.5)
CO2: 27 meq/L (ref 19–32)
CREATININE: 0.87 mg/dL (ref 0.40–1.20)
Chloride: 104 mEq/L (ref 96–112)
GFR: 67.03 mL/min (ref >60.00)
Glucose, Bld: 105 mg/dL — ABNORMAL HIGH (ref 70–99)
Potassium: 3.9 mEq/L (ref 3.5–5.1)
SODIUM: 140 meq/L (ref 135–145)
Total Protein: 6.1 g/dL (ref 6.0–8.3)

## 2018-10-09 LAB — LIPID PANEL
Cholesterol: 174 mg/dL (ref 0–200)
HDL: 53.3 mg/dL (ref >39.00)
LDL CALC: 98 mg/dL (ref 0–99)
NONHDL: 121.13
TRIGLYCERIDES: 117 mg/dL (ref 0.0–149.0)
Total CHOL/HDL Ratio: 3
VLDL: 23.4 mg/dL (ref 0.0–40.0)

## 2018-10-09 LAB — TSH: TSH: 3.11 u[IU]/mL (ref 0.35–4.50)

## 2018-10-09 LAB — HEMOGLOBIN A1C: HEMOGLOBIN A1C: 6 % (ref 4.6–6.5)

## 2018-10-09 MED ORDER — ZOSTER VAC RECOMB ADJUVANTED 50 MCG/0.5ML IM SUSR: 0.5000 mL | Freq: Once | INTRAMUSCULAR | 1 refills | Status: AC

## 2018-10-09 MED ORDER — TETANUS-DIPHTH-ACELL PERTUSSIS 5-2.5-18.5 LF-MCG/0.5 IM SUSP: 0.5000 mL | Freq: Once | INTRAMUSCULAR | 0 refills | Status: AC

## 2018-10-09 NOTE — Patient Instructions (Addendum)
Please schedule your Medicare Wellness with [facility].   GO TO THE LAB : Get the blood work     GO TO THE FRONT DESK Schedule your next appointment for a  check up in 6 months

## 2018-10-09 NOTE — Progress Notes (Signed)
Pre visit review using our clinic review tool, if applicable. No additional management support is needed unless otherwise documented below in the visit note. 

## 2018-10-09 NOTE — Progress Notes (Signed)
Subjective:    Patient ID: Lori Joseph, female    DOB: Sep 08, 1941, 77 y.o.   MRN: [id number]  DOS:  10/09/2018 Type of visit - description : cpx Interval history: Fatigue : see last OV, sx are much improved, she thinks sxs were d/t lack of sleep.  Currently takes a Tylenol PM, sleeps several hours and feels better.  Did not proceed with labs. Also, several months history of pain in the left deltoid area.  Only with certain movements particularly when she reaches  back.  Overall pain is gradually getting worse.  No symptoms on the R side.  Review of Systems No neck pain, no arm swelling.  Other than above, a 14 point review of systems is negative    Past Medical History:  Diagnosis Date  . Allergic rhinitis   . Anemia   . Atrial fibrillation ([place]) 03/2017  . Family history of anesthesia complication    daughter hard to wake up  . Glaucoma   . H/O acute pancreatitis   . H/O retinal detachment   . Hurthle cell neoplasm of thyroid    Right   . HYPERLIPIDEMIA   . Hypertension   . Hypothyroidism 05/31/2015  . OSTEOPENIA   . Personal history of radiation therapy 2015  . Primary cancer of upper outer quadrant of left female breast ([place])    Dr. Lindi Adie  . Radiation 11/11/15-12/30/14   left breast 50.4 gray, lumpectomy cavity boosted to 62.4 gray    Past Surgical History:  Procedure Laterality Date  . BREAST BIOPSY Left 08/27/14  . BREAST BIOPSY Left 11/27/2016  . BREAST LUMPECTOMY Left 09/15/2014  . CHOLECYSTECTOMY  2000  . DILATION AND CURETTAGE OF UTERUS    . laser eye surgery, detached retina Left   . RADIOACTIVE SEED GUIDED PARTIAL MASTECTOMY WITH AXILLARY SENTINEL LYMPH NODE BIOPSY Left 09/16/2014   Procedure: RADIOACTIVE SEED GUIDED PARTIAL MASTECTOMY WITH AXILLARY SENTINEL LYMPH NODE BIOPSY;  Surgeon: [name], MD;  Location: [facility];  Service: General;  Laterality: Left;  . thyoidectomy Left    partial  . THYROID LOBECTOMY N/A 02/10/2015   Procedure: RIGHT THYROID LOBECTOMY;  Surgeon: [name], MD;  Location: [facility];  Service: General;  Laterality: N/A;  . TONSILLECTOMY    . TUBAL LIGATION      Social History   Socioeconomic History  . Marital status: Married    Spouse name: Ruthann Cancer  . Number of children: 2  . Years of education: Not on file  . Highest education level: Not on file  Occupational History  . Occupation: RETIRED-- Retail banker: [employer]  Social Needs  . Financial resource strain: Not on file  . Food insecurity:    Worry: Not on file    Inability: Not on file  . Transportation needs:    Medical: Not on file    Non-medical: Not on file  Tobacco Use  . Smoking status: Never Smoker  . Smokeless tobacco: Never Used  Substance and Sexual Activity  . Alcohol use: No    Alcohol/week: 0.0 standard drinks  . Drug use: No  . Sexual activity: Not on file  Lifestyle  . Physical activity:    Days per week: Not on file    Minutes per session: Not on file  . Stress: Not on file  Relationships  . Social connections:    Talks on phone: Not on file    Gets together: Not on file  Attends religious service: Not on file    Active member of club or organization: Not on file    Attends meetings of clubs or organizations: Not on file    Relationship status: Not on file  . Intimate partner violence:    Fear of current or ex partner: Not on file    Emotionally abused: Not on file    Physically abused: Not on file    Forced sexual activity: Not on file  Other Topics Concern  . Not on file  Social History Narrative   Lives w/ husband     Family History  Problem Relation Age of Onset  . Breast cancer Sister 86  . Cancer Father        lung cancer ; smoker  . Cancer Brother        3 brothers with lung cancer, all smokers  . Cancer Paternal Uncle        2 pat uncles with lung cancer, smokers  . Kidney cancer Sister 30  . Melanoma Sister 75  . Ovarian cancer Other 48        niece with ovarian cancer (related through sister with breast cancer)  . Colon cancer Neg Hx   . CAD Neg Hx   . Esophageal cancer Neg Hx   . Stomach cancer Neg Hx   . Rectal cancer Neg Hx      Allergies as of 10/09/2018      Reactions   Codeine Anaphylaxis   Hyaluronic Acid  [collagen-chond-hyaluronic Acid] Itching, Rash   Carvedilol    REACTION: leg pain, edema   Felodipine    REACTION: HA, insomnia   Lisinopril    REACTION: HA, SWELLING   Losartan Potassium    REACTION: cough   Radiaplexrx [pyridoxine-zinc Picolinate] Itching, Rash      Medication List        Accurate as of 10/09/18 11:59 PM. Always use your most recent med list.          acyclovir ointment 5 % Commonly known as:  ZOVIRAX Apply 1 application topically every 3 (three) hours.   anastrozole 1 MG tablet Commonly known as:  ARIMIDEX Take 1 tablet (1 mg total) by mouth daily.   calcium carbonate 1250 MG capsule Take 2,500 mg by mouth every morning.   cholecalciferol 1000 units tablet Commonly known as:  VITAMIN D Take 2,500 Units by mouth every morning.   dorzolamide-timolol 22.3-6.8 MG/ML ophthalmic solution Commonly known as:  COSOPT Place 1 drop into both eyes every morning.   hydrochlorothiazide 25 MG tablet Commonly known as:  HYDRODIURIL Take 1 tablet (25 mg total) by mouth daily.   latanoprost 0.005 % ophthalmic solution Commonly known as:  XALATAN PLACE 1 DROP INTO BOTH EYES NIGHTLY.   levothyroxine 25 MCG tablet Commonly known as:  SYNTHROID, LEVOTHROID Take 1 tablet (25 mcg total) by mouth daily before breakfast.   metoprolol tartrate 25 MG tablet Commonly known as:  LOPRESSOR Take 1 tablet (25 mg total) by mouth 2 (two) times daily.   multivitamin tablet Take 1 tablet by mouth every morning.   pravastatin 40 MG tablet Commonly known as:  PRAVACHOL Take 1 tablet (40 mg total) by mouth daily.   rivaroxaban 20 MG Tabs tablet Commonly known as:  XARELTO Take 1 tablet  (20 mg total) by mouth daily with supper.   Tdap 5-2.5-18.5 LF-MCG/0.5 injection Commonly known as:  BOOSTRIX Inject 0.5 mLs into the muscle once for 1 dose.   telmisartan 40 MG  tablet Commonly known as:  MICARDIS Take 1 tablet (40 mg total) by mouth daily.   Zoster Vaccine Adjuvanted injection Commonly known as:  SHINGRIX Inject 0.5 mLs into the muscle once for 1 dose.          Objective:   Physical Exam BP 134/70 (BP Location: Right Arm, Patient Position: Sitting, Cuff Size: Normal)   Pulse (!) 56   Temp 97.6 F (36.4 C) (Oral)   Resp 16   Ht 5\' 3"  (1.6 m)   Wt 222 lb 6 oz (100.9 kg)   LMP  (LMP Unknown)   SpO2 96%   BMI 39.39 kg/m  General: Well developed, NAD, BMI noted Neck: No  thyromegaly.  No TTP of the cervical spine HEENT:  Normocephalic . Face symmetric, atraumatic Lungs:  CTA B Normal respiratory effort, no intercostal retractions, no accessory muscle use. Heart: RRR,  no murmur.  No pretibial edema bilaterally  Abdomen:  Not distended, soft, non-tender. No rebound or rigidity. MSK: Inspection and palpation of the shoulders, bicep, triceps: Normal, symmetric, no redness, no peau-d-orange or other abnormalities.  No swelling. Left armpit: No mass or swelling. Range of motion of the right shoulder normal.  Left shoulder motion limited by pain particularly when she reaches back. Skin: Exposed areas without rash. Not pale. Not jaundice Neurologic:  alert & oriented X3.  Speech normal, gait appropriate for age and unassisted Strength symmetric and appropriate for age.  Psych: Cognition and judgment appear intact.  Cooperative with normal attention span and concentration.  Behavior appropriate. No anxious or depressed appearing.     Assessment & Plan:    Assessment  HTN Hyperlipidemia Hypothyroidism Osteopenia: T score 2010 normal,  T score 05-2012 (-1.2), on calcium and vitamin D Morbid Obesity CV: -A. fib, new onset 03-2017 -Low risk stress  test 04-2017 Oncology: --Breast cancer, left, dx  2015, XRT 10/2015 to 12/2014, on Arimidex --Hurthle Cell neoplasm of the thyroid, thyroidectomy 01-2015 SKIN: ---Eczema ---[place] 2012, Dr Allyson Sabal ANEMIA- saw hematology, rx IV iron 10-2015 Chronic right-sided chest pain:  x-rays CT abdomen and pelvis 2016 negative, saw pain mngmt, ortho ---> better with Lidoderm patch and a local injection in the back Glaucoma   H/o acute pancreatitis H/o retinal  detachment Pulmonary: Abnormal chest x-ray and CT chest: Last CT chest 01-2016: See report. CXR 03/27/2016: No acute. Pulmonary: f/u prn ++ FH Lung Ca (pt is not a smoker)  PLAN: HTN, hyperlipidemia, hypothyroidism: Continue  HCTZ, metoprolol, micardis, Pravachol, Synthroid.  Checking labs A. fib: On Xarelto. Left deltoid pain: Refer to sports medicine Fatigue: Sxs resolved with better sleep, did not pursue labs. Vitamin D deficiency: Checking labs History of thyroid cancer: Last visit with surgery was 04/2018, new thyroid nodule noted on [name] , they plan is to recheck a [name] in 1 year. Thyroid exam today benign RTC 6 months

## 2018-10-09 NOTE — Assessment & Plan Note (Addendum)
Td 2018; needs a Tdap rx printed : pnm 23 2008 and 2018 ;prevnar 2015; zostavax 2012; shingrix discussed, rx printed  ; Flu shot  today -Female care:  per gyn  H/o breast ca, due for a  MMG  11-2018 -[facility]: Cscope @ [facility] 01-2008 : 2 polyps ----> tubular adenomas w/  low grade dysplasia Cscope 08/2010, Dr Henrene Pastor, very small polyps Last Cscope 08-2016 --Labs: CMP, FLP, CBC, A1c, TSH.  Also vitamin D, history of deficiency. - diet exercise discussed

## 2018-10-10 ENCOUNTER — Other Ambulatory Visit: Payer: Self-pay | Admitting: Internal Medicine

## 2018-10-12 NOTE — Assessment & Plan Note (Signed)
HTN, hyperlipidemia, hypothyroidism: Continue  HCTZ, metoprolol, micardis, Pravachol, Synthroid.  Checking labs A. fib: On Xarelto. Left deltoid pain: Refer to sports medicine Fatigue: Sxs resolved with better sleep, did not pursue labs. Vitamin D deficiency: Checking labs History of thyroid cancer: Last visit with surgery was 04/2018, new thyroid nodule noted on [name] , they plan is to recheck a [name] in 1 year. Thyroid exam today benign RTC 6 months

## 2018-10-13 LAB — VITAMIN D 1,25 DIHYDROXY
VITAMIN D3 1, 25 (OH): 19 pg/mL
Vitamin D 1, 25 (OH)2 Total: 27 pg/mL (ref 18–72)
Vitamin D2 1, 25 (OH)2: 8 pg/mL

## 2018-10-14 ENCOUNTER — Encounter: Payer: Self-pay | Admitting: Family Medicine

## 2018-10-14 ENCOUNTER — Ambulatory Visit: Payer: Medicare Other | Admitting: Family Medicine

## 2018-10-14 ENCOUNTER — Telehealth: Payer: Self-pay | Admitting: Internal Medicine

## 2018-10-14 VITALS — BP 151/75 | HR 60 | Ht 63.0 in | Wt 220.0 lb

## 2018-10-14 DIAGNOSIS — M25512 Pain in left shoulder: Secondary | ICD-10-CM

## 2018-10-14 MED ORDER — METHYLPREDNISOLONE ACETATE 40 MG/ML IJ SUSP
40.0000 mg | Freq: Once | INTRAMUSCULAR | Status: AC
  Administered 2018-10-14: 40 mg via INTRA_ARTICULAR

## 2018-10-14 NOTE — Progress Notes (Signed)
PCP and consultation requested by: Colon Branch, MD  Subjective:   HPI: Patient is a 77 y.o. female here for left shoulder pain.  Patient reports she's had several months of lateral left shoulder pain. Thinks this may date back to when she had radiation and had to have left arm behind her head for treatments but she's unsure. Pain is worse reaching back, trying to fasten bra. Pain level 10/10 and sharp. Not tried any treatment to date.  No night pain. No skin changes, numbness.  Past Medical History:  Diagnosis Date  . Allergic rhinitis   . Anemia   . Atrial fibrillation ([place]) 03/2017  . Family history of anesthesia complication    daughter hard to wake up  . Glaucoma   . H/O acute pancreatitis   . H/O retinal detachment   . Hurthle cell neoplasm of thyroid    Right   . HYPERLIPIDEMIA   . Hypertension   . Hypothyroidism 05/31/2015  . OSTEOPENIA   . Personal history of radiation therapy 2015  . Primary cancer of upper outer quadrant of left female breast ([place])    Dr. Lindi Adie  . Radiation 11/11/15-12/30/14   left breast 50.4 gray, lumpectomy cavity boosted to 62.4 gray    Current Outpatient Medications on File Prior to Visit  Medication Sig Dispense Refill  . acyclovir ointment (ZOVIRAX) 5 % Apply 1 application topically every 3 (three) hours. 30 g 0  . anastrozole (ARIMIDEX) 1 MG tablet Take 1 tablet (1 mg total) by mouth daily. 90 tablet 3  . calcium carbonate 1250 MG capsule Take 2,500 mg by mouth every morning.     . cholecalciferol (VITAMIN D) 1000 UNITS tablet Take 2,500 Units by mouth every morning.     . dorzolamide-timolol (COSOPT) 22.3-6.8 MG/ML ophthalmic solution Place 1 drop into both eyes every morning.     . hydrochlorothiazide (HYDRODIURIL) 25 MG tablet Take 1 tablet (25 mg total) by mouth daily. 90 tablet 2  . latanoprost (XALATAN) 0.005 % ophthalmic solution PLACE 1 DROP INTO BOTH EYES NIGHTLY.    Marland Kitchen levothyroxine (SYNTHROID, LEVOTHROID) 25 MCG tablet Take 1  tablet (25 mcg total) by mouth daily before breakfast. 90 tablet 1  . metoprolol tartrate (LOPRESSOR) 25 MG tablet Take 1 tablet (25 mg total) by mouth 2 (two) times daily. 180 tablet 2  . Multiple Vitamin (MULTIVITAMIN) tablet Take 1 tablet by mouth every morning.     . pravastatin (PRAVACHOL) 40 MG tablet Take 1 tablet (40 mg total) by mouth daily. 90 tablet 1  . rivaroxaban (XARELTO) 20 MG TABS tablet Take 1 tablet (20 mg total) by mouth daily with supper. 90 tablet 1  . telmisartan (MICARDIS) 40 MG tablet Take 1 tablet (40 mg total) by mouth daily. 30 tablet 5   No current facility-administered medications on file prior to visit.     Past Surgical History:  Procedure Laterality Date  . BREAST BIOPSY Left 08/27/14  . BREAST BIOPSY Left 11/27/2016  . BREAST LUMPECTOMY Left 09/15/2014  . CHOLECYSTECTOMY  2000  . DILATION AND CURETTAGE OF UTERUS    . laser eye surgery, detached retina Left   . RADIOACTIVE SEED GUIDED PARTIAL MASTECTOMY WITH AXILLARY SENTINEL LYMPH NODE BIOPSY Left 09/16/2014   Procedure: RADIOACTIVE SEED GUIDED PARTIAL MASTECTOMY WITH AXILLARY SENTINEL LYMPH NODE BIOPSY;  Surgeon: [name], MD;  [facility];  Service: General;  Laterality: Left;  . thyoidectomy Left    partial  . THYROID LOBECTOMY N/A 02/10/2015  Procedure: RIGHT THYROID LOBECTOMY;  Surgeon: [name], MD;  Location: [facility];  Service: General;  Laterality: N/A;  . TONSILLECTOMY    . TUBAL LIGATION      Allergies  Allergen Reactions  . Codeine Anaphylaxis  . Hyaluronic Acid  [Collagen-Chond-Hyaluronic Acid] Itching and Rash  . Carvedilol     REACTION: leg pain, edema  . Felodipine     REACTION: HA, insomnia  . Lisinopril     REACTION: HA, SWELLING  . Losartan Potassium     REACTION: cough  . Radiaplexrx [Pyridoxine-Zinc Picolinate] Itching and Rash    Social History   Socioeconomic History  . Marital status: Married    Spouse name: Ruthann Cancer  . Number of  children: 2  . Years of education: Not on file  . Highest education level: Not on file  Occupational History  . Occupation: RETIRED-- Retail banker: [employer]  Social Needs  . Financial resource strain: Not on file  . Food insecurity:    Worry: Not on file    Inability: Not on file  . Transportation needs:    Medical: Not on file    Non-medical: Not on file  Tobacco Use  . Smoking status: Never Smoker  . Smokeless tobacco: Never Used  Substance and Sexual Activity  . Alcohol use: No    Alcohol/week: 0.0 standard drinks  . Drug use: No  . Sexual activity: Not on file  Lifestyle  . Physical activity:    Days per week: Not on file    Minutes per session: Not on file  . Stress: Not on file  Relationships  . Social connections:    Talks on phone: Not on file    Gets together: Not on file    Attends religious service: Not on file    Active member of club or organization: Not on file    Attends meetings of clubs or organizations: Not on file    Relationship status: Not on file  . Intimate partner violence:    Fear of current or ex partner: Not on file    Emotionally abused: Not on file    Physically abused: Not on file    Forced sexual activity: Not on file  Other Topics Concern  . Not on file  Social History Narrative   Lives w/ husband    Family History  Problem Relation Age of Onset  . Breast cancer Sister 59  . Cancer Father        lung cancer ; smoker  . Cancer Brother        3 brothers with lung cancer, all smokers  . Cancer Paternal Uncle        2 pat uncles with lung cancer, smokers  . Kidney cancer Sister 67  . Melanoma Sister 53  . Ovarian cancer Other 19       niece with ovarian cancer (related through sister with breast cancer)  . Colon cancer Neg Hx   . CAD Neg Hx   . Esophageal cancer Neg Hx   . Stomach cancer Neg Hx   . Rectal cancer Neg Hx     LMP  (LMP Unknown)   Review of Systems: See HPI above.     Objective:   Physical Exam:  Gen: NAD, comfortable in exam room  Left shoulder: No swelling, ecchymoses.  No gross deformity. No TTP AC joint, biceps tendon. Active flexion and abduction to 90 degrees, painful.  Full IR and ER. Positive  Wynonia Musty. Negative Yergasons. Strength 4/5 with empty can and 5/5 resisted internal/external rotation.  Pain with empty can. NV intact distally.  Right shoulder: No deformity. FROM with 5/5 strength. No tenderness to palpation. NVI distally.   Assessment & Plan:  1. Left shoulder pain - 2/2 rotator cuff impingement.  Unable to take NSAIDs with her xarelto.  Given subacromial injection today.  She opted for home exercises instead of physical therapy for now.  She will contact us if not improving to go ahead with this.  F/u in 6 weeks.  After informed written consent timeout was performed, patient was seated in chair in exam room. Left shoulder was prepped with alcohol swab and utilizing lateral approach with ultrasound guidance, patient's left subacromial space was injected with 3:1 bupivicaine: depomedrol. Patient tolerated the procedure well without immediate complications.

## 2018-10-14 NOTE — Addendum Note (Signed)
Addended by: Sherrie George F on: 10/14/2018 04:32 PM   Modules accepted: Orders

## 2018-10-14 NOTE — Telephone Encounter (Signed)
Just an FYI

## 2018-10-14 NOTE — Patient Instructions (Signed)
You have rotator cuff impingement Try to avoid painful activities (overhead activities, lifting with extended arm) as much as possible. Tylenol 500mg  1-2 tabs three times a day if needed. Topical capsaicin or aspercreme may help up to 4 times a day. Subacromial injection may be beneficial to help with pain and to decrease inflammation - you were given this today Consider physical therapy with transition to home exercise program. Do home exercise program with theraband and scapular stabilization exercises daily 3 sets of 10 once a day. If not improving at follow-up we will consider further imaging, physical therapy, and/or nitro patches. Follow up with me in 6 weeks.

## 2018-10-14 NOTE — Telephone Encounter (Signed)
Copied from [place] [id number]. Topic: Quick Communication - See Telephone Encounter >> Oct 14, 2018  2:24 PM Rosalin Hawking wrote: CRM for notification. See Telephone encounter for: 10/14/18.   Pt came in office stating was seen today at [facility], and wanted provider to know that pt did not have rotator cuff rupture and that Dr Larose Kells was right. Pt was really happy to know that and that at [facility] they gave her an injection to help her with her pain. Pt just wanted to inform provider and let him know that she appreciated his help.

## 2018-10-14 NOTE — Telephone Encounter (Signed)
thx

## 2018-10-29 ENCOUNTER — Ambulatory Visit: Payer: Self-pay

## 2018-10-29 ENCOUNTER — Encounter (HOSPITAL_BASED_OUTPATIENT_CLINIC_OR_DEPARTMENT_OTHER): Payer: Self-pay

## 2018-10-29 ENCOUNTER — Emergency Department (HOSPITAL_BASED_OUTPATIENT_CLINIC_OR_DEPARTMENT_OTHER): Payer: Medicare Other

## 2018-10-29 ENCOUNTER — Emergency Department (HOSPITAL_BASED_OUTPATIENT_CLINIC_OR_DEPARTMENT_OTHER)
Admission: EM | Admit: 2018-10-29 | Discharge: 2018-10-29 | Disposition: A | Discharge status: Home / Self Care | Payer: Medicare Other | Attending: Emergency Medicine | Admitting: Emergency Medicine

## 2018-10-29 DIAGNOSIS — Z7901 Long term (current) use of anticoagulants: Secondary | ICD-10-CM | POA: Insufficient documentation

## 2018-10-29 DIAGNOSIS — Z9049 Acquired absence of other specified parts of digestive tract: Secondary | ICD-10-CM | POA: Diagnosis not present

## 2018-10-29 DIAGNOSIS — E039 Hypothyroidism, unspecified: Secondary | ICD-10-CM | POA: Insufficient documentation

## 2018-10-29 DIAGNOSIS — I10 Essential (primary) hypertension: Secondary | ICD-10-CM | POA: Insufficient documentation

## 2018-10-29 DIAGNOSIS — Z853 Personal history of malignant neoplasm of breast: Secondary | ICD-10-CM | POA: Diagnosis not present

## 2018-10-29 DIAGNOSIS — R0981 Nasal congestion: Secondary | ICD-10-CM | POA: Diagnosis present

## 2018-10-29 DIAGNOSIS — B9789 Other viral agents as the cause of diseases classified elsewhere: Secondary | ICD-10-CM

## 2018-10-29 DIAGNOSIS — Z79899 Other long term (current) drug therapy: Secondary | ICD-10-CM | POA: Insufficient documentation

## 2018-10-29 DIAGNOSIS — J069 Acute upper respiratory infection, unspecified: Secondary | ICD-10-CM

## 2018-10-29 DIAGNOSIS — R05 Cough: Secondary | ICD-10-CM | POA: Diagnosis not present

## 2018-10-29 LAB — GROUP A STREP BY PCR: Group A Strep by PCR: NOT DETECTED

## 2018-10-29 MED ORDER — TETRACAINE HCL 0.5 % OP SOLN
2.0000 [drp] | Freq: Once | OPHTHALMIC | Status: AC
  Administered 2018-10-29: 2 [drp] via OPHTHALMIC

## 2018-10-29 MED ORDER — TETRACAINE HCL 0.5 % OP SOLN
OPHTHALMIC | Status: AC
  Filled 2018-10-29: qty 4

## 2018-10-29 MED ORDER — FLUORESCEIN SODIUM 1 MG OP STRP
1.0000 | ORAL_STRIP | Freq: Once | OPHTHALMIC | Status: AC
  Administered 2018-10-29: 1 via OPHTHALMIC
  Filled 2018-10-29: qty 1

## 2018-10-29 NOTE — ED Notes (Signed)
Pt refused xray 

## 2018-10-29 NOTE — Telephone Encounter (Signed)
Please advise 

## 2018-10-29 NOTE — Telephone Encounter (Signed)
Pt. called to report sudden onset of symptoms yesterday with sore throat, puffy, red, crusty eyes, headache, nasal drainage, soreness in cheeks, and intermittent dry cough.  C/o chills.  Has not checked temp.  Reported brown mucus with streaks of blood noted when blowing her nose and clearing her throat.  Stated "I think I have a sinus infection."  Reported she has yellow-green, crusty drainage from her eyes.  Reported eyes are red and itch.  Stated she doesn't feel well and needs to see Dr. Larose Kells today.  No available appts. At PCP office today.  Pt. Stated she will accept appt. On 12/6, and will call back if symptoms worsen.  Advised will make Dr. Larose Kells aware of her eye symptoms, to see if something can be ordered prior to her appt. On Friday.         Reason for Disposition . [1] Eye with yellow/green discharge or eyelashes stick together AND [2] NO PCP standing order to call in antibiotic eye drops . [1] Sinus pain (not just congestion) AND [2] fever    C/o headache, some nasal drainage brown with streaks of blood, puffy, crusty eyes, and soreness in cheeks; c/o chills  Answer Assessment - Initial Assessment Questions 1. ONSET: "When did the nasal discharge start?"     Small amt. Of nasal drainage; "brown with streaks blood in it"  2. AMOUNT: "How much discharge is there?"     Small amt 3. COUGH: "Do you have a cough?" If yes, ask: "Describe the color of your sputum" (clear, white, yellow, green)    Occasional/ dry  4. RESPIRATORY DISTRESS: "Describe your breathing."      Denied  5. FEVER: "Do you have a fever?" If so, ask: "What is your temperature, how was it measured, and when did it start?"     Chills  6. SEVERITY: "Overall, how bad are you feeling right now?" (e.g., doesn't interfere with normal activities, staying home from school/work, staying in bed)      "I feel bad"  I just need an appt.  7. OTHER SYMPTOMS: "Do you have any other symptoms?" (e.g., sore throat, earache, wheezing,  vomiting)     Sore throat, headache, chills, eyes are puffy and crusty  8. PREGNANCY: "Is there any chance you are pregnant?" "When was your last menstrual period?"     N/a  Answer Assessment - Initial Assessment Questions 1. EYE DISCHARGE: "Is the discharge in one or both eyes?" "What color is it?" "How much is there?" "When did the discharge start?"      Crusty drainage; started yesterday 2. REDNESS OF SCLERA: "Is the redness in one or both eyes?" "When did the redness start?"      yes 3. EYELIDS: "Are the eyelids red or swollen?" If so, ask: "How much?"      puffy 4. VISION: "Is there any difficulty seeing clearly?"      *No Answer* 5. PAIN: "Is there any pain? If so, ask: "How bad is it?" (Scale 1-10; or mild, moderate, severe)    - MILD (1-3): doesn't interfere with normal activities     - MODERATE (4-7): interferes with normal activities or awakens from sleep    - SEVERE (8-10): excruciating pain, unable to do any normal activities       *No Answer* 6. CONTACT LENS: "Do you wear contacts?"     *No Answer* 7. OTHER SYMPTOMS: "Do you have any other symptoms?" (e.g., fever, runny nose, cough)     Chills, headache,  dry cough,nasal drainage brown with streaks of blood  8. PREGNANCY: "Is there any chance you are pregnant?" "When was your last menstrual period?"     N/a  Protocols used: EYE - PUS OR [place], COMMON COLD-A-AH

## 2018-10-29 NOTE — Telephone Encounter (Signed)
Recommend OTCs such as Mucinex DM for cough, Flonase for nasal congestion, artificial tears and warm compresses to help with the eye symptoms.

## 2018-10-29 NOTE — Discharge Instructions (Signed)
I recommend taking Flonase, Zyrtec, and nasal saline as prescribed over-the-counter.  You can take this with Mucinex.  Please follow-up with your doctor on Friday if your symptoms are not improving.  Please return the emergency department he develop any new or worsening symptoms.

## 2018-10-29 NOTE — ED Triage Notes (Signed)
Pt c/o URI s/s x2 days, productive cough with brown sputum

## 2018-10-29 NOTE — ED Provider Notes (Signed)
[facility] EMERGENCY DEPARTMENT Provider Note   CSN: [id number] Arrival date & time: 10/29/18  1647     History   Chief Complaint Chief Complaint  Patient presents with  . Cough    HPI Lori Joseph is a 77 y.o. female with history of atrial fibrillation anticoagulated on Xarelto, hypertension, hypothyroidism, glaucoma presents with a 2-day history of nasal congestion, bilateral eye pain and redness with drainage, and intermittent cough.  Patient has also had a sore throat.  She denies any injury to her eyes.  She reports she has been coughing up brown, blood-streaked sputum.  She has had some pain in her right ear intermittently.  She took Robitussin without relief.  She has had chills, but no fevers.  HPI  Past Medical History:  Diagnosis Date  . Allergic rhinitis   . Anemia   . Atrial fibrillation ([place]) 03/2017  . Family history of anesthesia complication    daughter hard to wake up  . Glaucoma   . H/O acute pancreatitis   . H/O retinal detachment   . Hurthle cell neoplasm of thyroid    Right   . HYPERLIPIDEMIA   . Hypertension   . Hypothyroidism 05/31/2015  . OSTEOPENIA   . Personal history of radiation therapy 2015  . Primary cancer of upper outer quadrant of left female breast ([place])    Dr. Lindi Adie  . Radiation 11/11/15-12/30/14   left breast 50.4 gray, lumpectomy cavity boosted to 62.4 gray    Patient Active Problem List   Diagnosis Date Noted  . Atrial fibrillation ([place]).Onset 03/2017 04/26/2017  . Pulmonary infiltrates on CXR 01/30/2016  . Upper airway cough syndrome 01/30/2016  . PCP NOTES >>>>>>>>>>>>>>>> 08/25/2015  . Hypothyroidism 05/31/2015  . Family history of malignant neoplasm of breast 03/11/2015  . Family history of malignant neoplasm of ovary 03/11/2015  . Breast cancer ([place]) 03/11/2015  . PCP comments--R Chest wall and flank pain 12/31/2014  . 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 Benign neoplasm of thyroid  12/31/2014  . Chest pain 12/31/2014  . Breast cancer of upper-outer quadrant of left female breast ([place]) 09/08/2014  . Eczema 08/05/2014  . Annual physical exam >>>>>>>>>>>>>>>>>>>>>>>> 06/18/2012  . BCC (basal cell carcinoma of skin) 06/18/2012  . OTHER&UNSPECIFIED DISEASES THE ORAL SOFT TISSUES 02/10/2011  . HYPERLIPIDEMIA 01/12/2011  . OSTEOPENIA 09/28/2009  . 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 Morbid obesity ([place]) 05/18/2009  . Essential hypertension 10/15/2007  . PANCREATITIS, HX OF 03/13/2007    Past Surgical History:  Procedure Laterality Date  . BREAST BIOPSY Left 08/27/14  . BREAST BIOPSY Left 11/27/2016  . BREAST LUMPECTOMY Left 09/15/2014  . CHOLECYSTECTOMY  2000  . DILATION AND CURETTAGE OF UTERUS    . laser eye surgery, detached retina Left   . RADIOACTIVE SEED GUIDED PARTIAL MASTECTOMY WITH AXILLARY SENTINEL LYMPH NODE BIOPSY Left 09/16/2014   Procedure: RADIOACTIVE SEED GUIDED PARTIAL MASTECTOMY WITH AXILLARY SENTINEL LYMPH NODE BIOPSY;  Surgeon: [name], MD;  Location: [facility];  Service: General;  Laterality: Left;  . thyoidectomy Left    partial  . THYROID LOBECTOMY N/A 02/10/2015   Procedure: RIGHT THYROID LOBECTOMY;  Surgeon: [name], MD;  [facility];  Service: General;  Laterality: N/A;  . TONSILLECTOMY    . TUBAL LIGATION       OB History   None      Home Medications    Prior to Admission medications   Medication Sig  Start Date End Date Taking? Authorizing Provider  acyclovir ointment (ZOVIRAX) 5 % Apply 1 application topically every 3 (three) hours. 03/28/18   Colon Branch, MD  anastrozole (ARIMIDEX) 1 MG tablet Take 1 tablet (1 mg total) by mouth daily. 12/24/17   [name], MD  calcium carbonate 1250 MG capsule Take 2,500 mg by mouth every morning.     [provider]  cholecalciferol (VITAMIN D) 1000 UNITS tablet Take 2,500 Units by mouth every morning.     [provider]    dorzolamide-timolol (COSOPT) 22.3-6.8 MG/ML ophthalmic solution Place 1 drop into both eyes every morning.     [provider]  hydrochlorothiazide (HYDRODIURIL) 25 MG tablet Take 1 tablet (25 mg total) by mouth daily. 10/10/18   Colon Branch, MD  latanoprost (XALATAN) 0.005 % ophthalmic solution PLACE 1 DROP INTO BOTH EYES NIGHTLY. 12/03/16   [provider]  levothyroxine (SYNTHROID, LEVOTHROID) 25 MCG tablet Take 1 tablet (25 mcg total) by mouth daily before breakfast. 06/13/18   Colon Branch, MD  metoprolol tartrate (LOPRESSOR) 25 MG tablet Take 1 tablet (25 mg total) by mouth 2 (two) times daily. 10/10/18   Colon Branch, MD  Multiple Vitamin (MULTIVITAMIN) tablet Take 1 tablet by mouth every morning.     [provider]  pravastatin (PRAVACHOL) 40 MG tablet Take 1 tablet (40 mg total) by mouth daily. 06/30/18   Colon Branch, MD  rivaroxaban (XARELTO) 20 MG TABS tablet Take 1 tablet (20 mg total) by mouth daily with supper. 07/07/18   Colon Branch, MD  telmisartan (MICARDIS) 40 MG tablet Take 1 tablet (40 mg total) by mouth daily. 05/30/18   Colon Branch, MD    Family History Family History  Problem Relation Age of Onset  . Breast cancer Sister 29  . Cancer Father        lung cancer ; smoker  . Cancer Brother        3 brothers with lung cancer, all smokers  . Cancer Paternal Uncle        2 pat uncles with lung cancer, smokers  . Kidney cancer Sister 79  . Melanoma Sister 73  . Ovarian cancer Other 36       niece with ovarian cancer (related through sister with breast cancer)  . Colon cancer Neg Hx   . CAD Neg Hx   . Esophageal cancer Neg Hx   . Stomach cancer Neg Hx   . Rectal cancer Neg Hx     Social History Social History   Tobacco Use  . Smoking status: Never Smoker  . Smokeless tobacco: Never Used  Substance Use Topics  . Alcohol use: No    Alcohol/week: 0.0 standard drinks  . Drug use: No     Allergies   Codeine; Hyaluronic acid   [collagen-chond-hyaluronic acid]; Carvedilol; Felodipine; Lisinopril; Losartan potassium; and Radiaplexrx [pyridoxine-zinc picolinate]   Review of Systems Review of Systems  Constitutional: Positive for chills. Negative for fever.  HENT: Positive for congestion, ear pain, sinus pressure and sore throat.   Eyes: Positive for photophobia, pain and redness. Negative for visual disturbance.  Respiratory: Positive for cough. Negative for shortness of breath.   Cardiovascular: Negative for chest pain.  Gastrointestinal: Negative for abdominal pain, nausea and vomiting.     Physical Exam Updated Vital Signs BP (!) 151/65 (BP Location: Right Arm)   Pulse (!) 54   Temp 98.5 F (36.9 C) (Oral)   Resp 18  LMP  (LMP Unknown)   SpO2 99%   Physical Exam  Constitutional: She appears well-developed and well-nourished. No distress.  HENT:  Head: Normocephalic and atraumatic.  Mouth/Throat: Oropharynx is clear and moist. No oropharyngeal exudate.  Mild maxillary facial tenderness  Eyes: Pupils are equal, round, and reactive to light. EOM and lids are normal. Right eye exhibits no discharge and no exudate. Left eye exhibits no discharge and no exudate. Right conjunctiva is injected. Left conjunctiva is injected (worse than right). No scleral icterus.  No uptake on fluorescein's staining Tono-Pen pressures average 16 on the right and 18 on the left   Visual Acuity  Right Eye Distance: 20/70 Left Eye Distance: 20/50 Bilateral Distance: 20/30  Neck: Normal range of motion. Neck supple. No thyromegaly present.  Cardiovascular: Normal rate, regular rhythm, normal heart sounds and intact distal pulses. Exam reveals no gallop and no friction rub.  No murmur heard. Pulmonary/Chest: Effort normal and breath sounds normal. No stridor. No respiratory distress. She has no wheezes. She has no rales.  Abdominal: Soft. Bowel sounds are normal. She exhibits no distension. There is no tenderness. There is no  rebound and no guarding.  Musculoskeletal: She exhibits no edema.  Lymphadenopathy:    She has no cervical adenopathy.  Neurological: She is alert. Coordination normal.  Skin: Skin is warm and dry. No rash noted. She is not diaphoretic. No pallor.  Psychiatric: She has a normal mood and affect.  Nursing note and vitals reviewed.    ED Treatments / Results  Labs (all labs ordered are listed, but only abnormal results are displayed) Labs Reviewed  GROUP A STREP BY PCR    EKG None  Radiology No results found.  Procedures Procedures (including critical care time)  Medications Ordered in ED Medications  tetracaine (PONTOCAINE) 0.5 % ophthalmic solution (has no administration in time range)  fluorescein ophthalmic strip 1 strip (1 strip Both Eyes Given 10/29/18 1756)  tetracaine (PONTOCAINE) 0.5 % ophthalmic solution 2 drop (2 drops Both Eyes Given 10/29/18 1757)     Initial Impression / Assessment and Plan / ED Course  I have reviewed the triage vital signs and the nursing notes.  Pertinent labs & imaging results that were available during my care of the patient were reviewed by me and considered in my medical decision making (see chart for details).     Patient presenting with upper respiratory infection.  She has had bilateral eye redness and drainage with irritation and pain.  Tono-Pen pressures are within normal limits and there is no uptake on fluorescein staining.  Patient has had associated postnasal drip, sore throat, and brown with blood-streaked sputum.  She has mild maxillary facial tenderness.  She declines chest x-ray, but lungs are clear on auscultation.  She reports she believes is also on her face.  Advised Flonase, Zyrtec, nasal saline, and continue Mucinex.  Patient has a follow-up appointment in 2 days with her doctor.  Return precautions discussed.  Patient understands and agrees with plan.  Patient vitals stable throughout ED course and discharged in  satisfactory condition. I discussed patient case with Dr. Laverta Baltimore who guided the patient's management and agrees with plan.   Final Clinical Impressions(s) / ED Diagnoses   Final diagnoses:  Viral URI with cough    ED Discharge Orders    None       Frederica Kuster, PA-C 10/29/18 1856    Margette Fast, MD 10/30/18 1133

## 2018-10-30 NOTE — Telephone Encounter (Signed)
Author phoned pt. to relay Dr. Ethel Rana message. Pt. stated she went to ED last night for her sx, and was told flonase would help her, discharged with viral diagnosis. Author reviewed the flonase recommendation from Dr. Larose Kells, as well as the multiple others he had suggested, and pt. Verbalized understanding.

## 2018-10-31 ENCOUNTER — Ambulatory Visit: Payer: Medicare Other | Admitting: Internal Medicine

## 2018-11-06 ENCOUNTER — Other Ambulatory Visit: Payer: Self-pay

## 2018-11-06 MED ORDER — TELMISARTAN 40 MG PO TABS: 40.0000 mg | ORAL_TABLET | Freq: Every day | ORAL | 1 refills | Status: DC

## 2018-11-25 ENCOUNTER — Encounter: Payer: Self-pay | Admitting: Family Medicine

## 2018-11-25 ENCOUNTER — Ambulatory Visit: Payer: Medicare Other | Admitting: Family Medicine

## 2018-11-25 VITALS — BP 155/92 | HR 62 | Ht 63.0 in | Wt 221.0 lb

## 2018-11-25 DIAGNOSIS — M25512 Pain in left shoulder: Secondary | ICD-10-CM | POA: Diagnosis not present

## 2018-11-25 NOTE — Patient Instructions (Signed)
You're doing great! Call me if you need anything otherwise follow up as needed. 

## 2018-11-25 NOTE — Progress Notes (Signed)
PCP and consultation requested by: Colon Branch, MD  Subjective:   HPI: Patient is a 77 y.o. female here for left shoulder pain.  11/19: Patient reports she's had several months of lateral left shoulder pain. Thinks this may date back to when she had radiation and had to have left arm behind her head for treatments but she's unsure. Pain is worse reaching back, trying to fasten bra. Pain level 10/10 and sharp. Not tried any treatment to date.  No night pain. No skin changes, numbness.  12/31: Patient reports pain has resolved since injection last visit. No issues currently. Able to reach behind, overhead without pain. No skin changes.  Past Medical History:  Diagnosis Date  . Allergic rhinitis   . Anemia   . Atrial fibrillation ([place]) 03/2017  . Family history of anesthesia complication    daughter hard to wake up  . Glaucoma   . H/O acute pancreatitis   . H/O retinal detachment   . Hurthle cell neoplasm of thyroid    Right   . HYPERLIPIDEMIA   . Hypertension   . Hypothyroidism 05/31/2015  . OSTEOPENIA   . Personal history of radiation therapy 2015  . Primary cancer of upper outer quadrant of left female breast ([place])    Dr. Lindi Adie  . Radiation 11/11/15-12/30/14   left breast 50.4 gray, lumpectomy cavity boosted to 62.4 gray    Current Outpatient Medications on File Prior to Visit  Medication Sig Dispense Refill  . acyclovir ointment (ZOVIRAX) 5 % Apply 1 application topically every 3 (three) hours. 30 g 0  . anastrozole (ARIMIDEX) 1 MG tablet Take 1 tablet (1 mg total) by mouth daily. 90 tablet 3  . calcium carbonate 1250 MG capsule Take 2,500 mg by mouth every morning.     . cholecalciferol (VITAMIN D) 1000 UNITS tablet Take 2,500 Units by mouth every morning.     . dorzolamide-timolol (COSOPT) 22.3-6.8 MG/ML ophthalmic solution Place 1 drop into both eyes every morning.     . hydrochlorothiazide (HYDRODIURIL) 25 MG tablet Take 1 tablet (25 mg total) by mouth daily. 90  tablet 2  . latanoprost (XALATAN) 0.005 % ophthalmic solution PLACE 1 DROP INTO BOTH EYES NIGHTLY.    Marland Kitchen levothyroxine (SYNTHROID, LEVOTHROID) 25 MCG tablet Take 1 tablet (25 mcg total) by mouth daily before breakfast. 90 tablet 1  . metoprolol tartrate (LOPRESSOR) 25 MG tablet Take 1 tablet (25 mg total) by mouth 2 (two) times daily. 180 tablet 2  . Multiple Vitamin (MULTIVITAMIN) tablet Take 1 tablet by mouth every morning.     . pravastatin (PRAVACHOL) 40 MG tablet Take 1 tablet (40 mg total) by mouth daily. 90 tablet 1  . rivaroxaban (XARELTO) 20 MG TABS tablet Take 1 tablet (20 mg total) by mouth daily with supper. 90 tablet 1  . telmisartan (MICARDIS) 40 MG tablet Take 1 tablet (40 mg total) by mouth daily. 90 tablet 1   No current facility-administered medications on file prior to visit.     Past Surgical History:  Procedure Laterality Date  . BREAST BIOPSY Left 08/27/14  . BREAST BIOPSY Left 11/27/2016  . BREAST LUMPECTOMY Left 09/15/2014  . CHOLECYSTECTOMY  2000  . DILATION AND CURETTAGE OF UTERUS    . laser eye surgery, detached retina Left   . RADIOACTIVE SEED GUIDED PARTIAL MASTECTOMY WITH AXILLARY SENTINEL LYMPH NODE BIOPSY Left 09/16/2014   Procedure: RADIOACTIVE SEED GUIDED PARTIAL MASTECTOMY WITH AXILLARY SENTINEL LYMPH NODE BIOPSY;  Surgeon: [name], MD;  Location: [facility];  Service: General;  Laterality: Left;  . thyoidectomy Left    partial  . THYROID LOBECTOMY N/A 02/10/2015   Procedure: RIGHT THYROID LOBECTOMY;  Surgeon: [name], MD;  Location: [facility];  Service: General;  Laterality: N/A;  . TONSILLECTOMY    . TUBAL LIGATION      Allergies  Allergen Reactions  . Codeine Anaphylaxis  . Hyaluronic Acid  [Collagen-Chond-Hyaluronic Acid] Itching and Rash  . Carvedilol     REACTION: leg pain, edema  . Felodipine     REACTION: HA, insomnia  . Lisinopril     REACTION: HA, SWELLING  . Losartan Potassium     REACTION: cough  . Radiaplexrx  [Pyridoxine-Zinc Picolinate] Itching and Rash    Social History   Socioeconomic History  . Marital status: Married    Spouse name: Ruthann Cancer  . Number of children: 2  . Years of education: Not on file  . Highest education level: Not on file  Occupational History  . Occupation: RETIRED-- Retail banker: [employer]  Social Needs  . Financial resource strain: Not on file  . Food insecurity:    Worry: Not on file    Inability: Not on file  . Transportation needs:    Medical: Not on file    Non-medical: Not on file  Tobacco Use  . Smoking status: Never Smoker  . Smokeless tobacco: Never Used  Substance and Sexual Activity  . Alcohol use: No    Alcohol/week: 0.0 standard drinks  . Drug use: No  . Sexual activity: Not on file  Lifestyle  . Physical activity:    Days per week: Not on file    Minutes per session: Not on file  . Stress: Not on file  Relationships  . Social connections:    Talks on phone: Not on file    Gets together: Not on file    Attends religious service: Not on file    Active member of club or organization: Not on file    Attends meetings of clubs or organizations: Not on file    Relationship status: Not on file  . Intimate partner violence:    Fear of current or ex partner: Not on file    Emotionally abused: Not on file    Physically abused: Not on file    Forced sexual activity: Not on file  Other Topics Concern  . Not on file  Social History Narrative   Lives w/ husband    Family History  Problem Relation Age of Onset  . Breast cancer Sister 58  . Cancer Father        lung cancer ; smoker  . Cancer Brother        3 brothers with lung cancer, all smokers  . Cancer Paternal Uncle        2 pat uncles with lung cancer, smokers  . Kidney cancer Sister 63  . Melanoma Sister 85  . Ovarian cancer Other 71       niece with ovarian cancer (related through sister with breast cancer)  . Colon cancer Neg Hx   . CAD Neg Hx    . Esophageal cancer Neg Hx   . Stomach cancer Neg Hx   . Rectal cancer Neg Hx     LMP  (LMP Unknown)   Review of Systems: See HPI above.     Objective:  Physical Exam:  Gen: NAD, comfortable in exam room  Left shoulder:  No swelling, ecchymoses.  No gross deformity. No TTP. Lacks about 20 degrees abduction and flexion but not painful. Negative Hawkins, Neers. Strength 5/5 with empty can and resisted internal/external rotation. NV intact distally.   Assessment & Plan:  1. Left shoulder pain - 2/2 rotator cuff impingement.  Pain resolved following subacromial injection.  F/u prn.

## 2018-11-27 ENCOUNTER — Other Ambulatory Visit: Payer: Self-pay | Admitting: Internal Medicine

## 2018-11-27 DIAGNOSIS — Z853 Personal history of malignant neoplasm of breast: Secondary | ICD-10-CM

## 2018-12-01 NOTE — Progress Notes (Signed)
Cardiology Office Note   Date:  12/02/2018   ID:  Lori Joseph, DOB 1941-05-21, MRN [id number]  PCP:  Colon Branch, MD  Cardiologist:   Jenkins Rouge, MD   No chief complaint on file.     History of Present Illness: Lori Joseph is a 78 y.o. female first seen in June 2018 for new onset atrial fibrillation.She was started on metoprolol and xaretlto  Converted spontaneously   June 2018  Echo ordered and reviewed EF 55-60% mild LAE trivial MR Myovue normal EF 56% no ischemia   She has hypothyroidism TSH normal 04/25/17 2.95 CRF;s include HTN and elevated lipids.    This patients CHA2DS2-VASc Score and unadjusted Ischemic Stroke Rate (% per year) is equal to 4.8 % stroke rate/year from a score of 4  Doing well no palpitations compliant with meds Discussed phone apps and iwatch apps for PAF  Had left shoulder pain and had subarcromial steroid injection 11/25/18  Younger sister just diagnosed with brain tumor Being Rx in [place]   Past Medical History:  Diagnosis Date  . Allergic rhinitis   . Anemia   . Atrial fibrillation ([place]) 03/2017  . Family history of anesthesia complication    daughter hard to wake up  . Glaucoma   . H/O acute pancreatitis   . H/O retinal detachment   . Hurthle cell neoplasm of thyroid    Right   . HYPERLIPIDEMIA   . Hypertension   . Hypothyroidism 05/31/2015  . OSTEOPENIA   . Personal history of radiation therapy 2015  . Primary cancer of upper outer quadrant of left female breast ([place])    Dr. Lindi Adie  . Radiation 11/11/15-12/30/14   left breast 50.4 gray, lumpectomy cavity boosted to 62.4 gray    Past Surgical History:  Procedure Laterality Date  . BREAST BIOPSY Left 08/27/14  . BREAST BIOPSY Left 11/27/2016  . BREAST LUMPECTOMY Left 09/15/2014  . CHOLECYSTECTOMY  2000  . DILATION AND CURETTAGE OF UTERUS    . laser eye surgery, detached retina Left   . RADIOACTIVE SEED GUIDED PARTIAL MASTECTOMY WITH AXILLARY SENTINEL LYMPH NODE BIOPSY  Left 09/16/2014   Procedure: RADIOACTIVE SEED GUIDED PARTIAL MASTECTOMY WITH AXILLARY SENTINEL LYMPH NODE BIOPSY;  Surgeon: [name], MD;  Location: [facility];  Service: General;  Laterality: Left;  . thyoidectomy Left    partial  . THYROID LOBECTOMY N/A 02/10/2015   Procedure: RIGHT THYROID LOBECTOMY;  Surgeon: [name], MD;  Location: [facility];  Service: General;  Laterality: N/A;  . TONSILLECTOMY    . TUBAL LIGATION       Current Outpatient Medications  Medication Sig Dispense Refill  . acyclovir ointment (ZOVIRAX) 5 % Apply 1 application topically every 3 (three) hours. 30 g 0  . anastrozole (ARIMIDEX) 1 MG tablet Take 1 tablet (1 mg total) by mouth daily. 90 tablet 3  . calcium carbonate 1250 MG capsule Take 2,500 mg by mouth every morning.     . cholecalciferol (VITAMIN D) 1000 UNITS tablet Take 2,500 Units by mouth every morning.     . dorzolamide-timolol (COSOPT) 22.3-6.8 MG/ML ophthalmic solution Place 1 drop into both eyes every morning.     . hydrochlorothiazide (HYDRODIURIL) 25 MG tablet Take 1 tablet (25 mg total) by mouth daily. 90 tablet 2  . latanoprost (XALATAN) 0.005 % ophthalmic solution PLACE 1 DROP INTO BOTH EYES NIGHTLY.    Marland Kitchen levothyroxine (SYNTHROID, LEVOTHROID) 25 MCG tablet Take 1 tablet (25 mcg total)  by mouth daily before breakfast. 90 tablet 1  . metoprolol tartrate (LOPRESSOR) 25 MG tablet Take 1 tablet (25 mg total) by mouth 2 (two) times daily. 180 tablet 2  . Multiple Vitamin (MULTIVITAMIN) tablet Take 1 tablet by mouth every morning.     . pravastatin (PRAVACHOL) 40 MG tablet Take 1 tablet (40 mg total) by mouth daily. 90 tablet 1  . rivaroxaban (XARELTO) 20 MG TABS tablet Take 1 tablet (20 mg total) by mouth daily with supper. 90 tablet 1  . telmisartan (MICARDIS) 40 MG tablet Take 1 tablet (40 mg total) by mouth daily. 90 tablet 1   No current facility-administered medications for this visit.     Allergies:   Codeine; Hyaluronic  acid  [collagen-chond-hyaluronic acid]; Carvedilol; Felodipine; Lisinopril; Losartan potassium; and Radiaplexrx [pyridoxine-zinc picolinate]    Social History:  The patient  reports that she has never smoked. She has never used smokeless tobacco. She reports that she does not drink alcohol or use drugs.   Family History:  The patient's family history includes Breast cancer (age of onset: 80) in her sister; Cancer in her brother, father, and paternal uncle; Kidney cancer (age of onset: 78) in her sister; Melanoma (age of onset: 31) in her sister; Ovarian cancer (age of onset: 53) in an other family member.    ROS:  Please see the history of present illness.   Otherwise, review of systems are positive for none.   All other systems are reviewed and negative.    PHYSICAL EXAM: VS:  BP (!) 142/86   Pulse 77   Ht 5\' 3"  (1.6 m)   Wt 223 lb 1.9 oz (101.2 kg)   LMP  (LMP Unknown)   SpO2 99%   BMI 39.52 kg/m  , BMI Body mass index is 39.52 kg/m. Affect appropriate Overweight looks younger than stated age  53: normal Neck supple with no adenopathy JVP normal no bruits no thyromegaly Lungs clear with no wheezing and good diaphragmatic motion Heart:  S1/S2 no murmur, no rub, gallop or click PMI normal Abdomen: benighn, BS positve, no tenderness, no AAA no bruit.  No HSM or HJR Distal pulses intact with no bruits Plus one edema varicosities right greater than left  Neuro non-focal Skin warm and dry No muscular weakness    EKG:  12/02/18 SR rate 77 Normal    Recent Labs: 10/09/2018: ALT 15; BUN 22; Creatinine, Ser 0.87; Hemoglobin 11.9; Platelets 195.0; Potassium 3.9; Sodium 140; TSH 3.11    Lipid Panel    Component Value Date/Time   CHOL 174 10/09/2018 1124   TRIG 117.0 10/09/2018 1124   HDL 53.30 10/09/2018 1124   CHOLHDL 3 10/09/2018 1124   VLDL 23.4 10/09/2018 1124   LDLCALC 98 10/09/2018 1124   LDLDIRECT 177.2 08/04/2013 1147      Wt Readings from Last 3 Encounters:    12/02/18 223 lb 1.9 oz (101.2 kg)  11/25/18 221 lb (100.2 kg)  10/14/18 220 lb (99.8 kg)      Other studies Reviewed: Additional studies/ records that were reviewed today include: Notes DR Larose Kells labs and echo see HPI .    ASSESSMENT AND PLAN:  1.  Atrial Fibrillation continue xarelto and lopressor maintaining NSR no bleeding issues CBC and BMET normal 10/09/18  2. HTN: Well controlled.  Continue current medications and low sodium Dash type diet.     3. HL: continue statin labs with Dr Larose Kells last LDL 98 with normal LFTls 10/09/18  4. Thyroid on  replacement TSH  Normal 3.1 continue current dose  5. Fatigue improved with conversion to NSR likely from rapid rate    Current medicines are reviewed at length with the patient today.  The patient does not have concerns regarding medicines.  The following changes have been made:  no change  Labs/ tests ordered today include:    Orders Placed This Encounter  Procedures  . EKG 12-Lead     Disposition:   FU with me in a year      Signed, Jenkins Rouge, MD  12/02/2018 2:20 [facility] [address], [place], [place]  [address] Phone: [phone]; Fax: [phone]

## 2018-12-02 ENCOUNTER — Encounter: Payer: Self-pay | Admitting: Cardiovascular Disease

## 2018-12-02 ENCOUNTER — Ambulatory Visit: Payer: Medicare Other | Admitting: Cardiovascular Disease

## 2018-12-02 VITALS — BP 142/86 | HR 77 | Ht 63.0 in | Wt 223.1 lb

## 2018-12-02 DIAGNOSIS — E785 Hyperlipidemia, unspecified: Secondary | ICD-10-CM | POA: Diagnosis not present

## 2018-12-02 DIAGNOSIS — I480 Paroxysmal atrial fibrillation: Secondary | ICD-10-CM

## 2018-12-02 DIAGNOSIS — I10 Essential (primary) hypertension: Secondary | ICD-10-CM

## 2018-12-02 NOTE — Patient Instructions (Addendum)

## 2018-12-10 DIAGNOSIS — Z9889 Other specified postprocedural states: Secondary | ICD-10-CM | POA: Diagnosis not present

## 2018-12-10 DIAGNOSIS — H40023 Open angle with borderline findings, high risk, bilateral: Secondary | ICD-10-CM | POA: Diagnosis not present

## 2018-12-10 DIAGNOSIS — H2513 Age-related nuclear cataract, bilateral: Secondary | ICD-10-CM | POA: Diagnosis not present

## 2018-12-10 DIAGNOSIS — H35371 Puckering of macula, right eye: Secondary | ICD-10-CM | POA: Diagnosis not present

## 2018-12-11 ENCOUNTER — Other Ambulatory Visit: Payer: Self-pay | Admitting: Internal Medicine

## 2018-12-12 ENCOUNTER — Ambulatory Visit
Admission: RE | Admit: 2018-12-12 | Discharge: 2018-12-12 | Disposition: A | Discharge status: Home / Self Care | Payer: Medicare Other | Source: Ambulatory Visit | Attending: Internal Medicine | Admitting: Internal Medicine

## 2018-12-12 DIAGNOSIS — R922 Inconclusive mammogram: Secondary | ICD-10-CM | POA: Diagnosis not present

## 2018-12-12 DIAGNOSIS — Z853 Personal history of malignant neoplasm of breast: Secondary | ICD-10-CM

## 2018-12-25 ENCOUNTER — Telehealth: Payer: Self-pay | Admitting: Hematology and Oncology

## 2018-12-25 ENCOUNTER — Encounter (INDEPENDENT_AMBULATORY_CARE_PROVIDER_SITE_OTHER): Payer: Self-pay

## 2018-12-25 NOTE — Telephone Encounter (Signed)
VG PAL 2/4 - moved f/u to 2/18. Spoke with patient.

## 2018-12-29 ENCOUNTER — Encounter: Payer: Self-pay | Admitting: Internal Medicine

## 2018-12-30 ENCOUNTER — Ambulatory Visit: Payer: Medicare Other | Admitting: Hematology and Oncology

## 2018-12-31 DIAGNOSIS — Z124 Encounter for screening for malignant neoplasm of cervix: Secondary | ICD-10-CM | POA: Diagnosis not present

## 2018-12-31 DIAGNOSIS — R87610 Atypical squamous cells of undetermined significance on cytologic smear of cervix (ASC-US): Secondary | ICD-10-CM | POA: Diagnosis not present

## 2019-01-01 ENCOUNTER — Ambulatory Visit (INDEPENDENT_AMBULATORY_CARE_PROVIDER_SITE_OTHER): Payer: Medicare Other | Admitting: Family Medicine

## 2019-01-01 ENCOUNTER — Encounter (INDEPENDENT_AMBULATORY_CARE_PROVIDER_SITE_OTHER): Payer: Self-pay | Admitting: Family Medicine

## 2019-01-01 VITALS — BP 136/79 | HR 55 | Temp 97.8°F | Ht 63.0 in | Wt 217.0 lb

## 2019-01-01 DIAGNOSIS — Z1331 Encounter for screening for depression: Secondary | ICD-10-CM

## 2019-01-01 DIAGNOSIS — R5383 Other fatigue: Secondary | ICD-10-CM | POA: Diagnosis not present

## 2019-01-01 DIAGNOSIS — Z0289 Encounter for other administrative examinations: Secondary | ICD-10-CM

## 2019-01-01 DIAGNOSIS — Z6838 Body mass index (BMI) 38.0-38.9, adult: Secondary | ICD-10-CM

## 2019-01-01 DIAGNOSIS — I10 Essential (primary) hypertension: Secondary | ICD-10-CM | POA: Diagnosis not present

## 2019-01-01 DIAGNOSIS — R0602 Shortness of breath: Secondary | ICD-10-CM | POA: Diagnosis not present

## 2019-01-01 DIAGNOSIS — Z853 Personal history of malignant neoplasm of breast: Secondary | ICD-10-CM | POA: Diagnosis not present

## 2019-01-02 ENCOUNTER — Other Ambulatory Visit: Payer: Self-pay | Admitting: Internal Medicine

## 2019-01-02 LAB — COMPREHENSIVE METABOLIC PANEL
ALT: 16 IU/L (ref 0–32)
AST: 16 IU/L (ref 0–40)
Albumin/Globulin Ratio: 1.9 (ref 1.2–2.2)
Albumin: 4.2 g/dL (ref 3.7–4.7)
Alkaline Phosphatase: 103 IU/L (ref 39–117)
BUN / CREAT RATIO: 29 — AB (ref 12–28)
BUN: 29 mg/dL — ABNORMAL HIGH (ref 8–27)
Bilirubin Total: 0.8 mg/dL (ref 0.0–1.2)
CO2: 22 mmol/L (ref 20–29)
Calcium: 9.5 mg/dL (ref 8.7–10.3)
Chloride: 105 mmol/L (ref 96–106)
Creatinine, Ser: 1 mg/dL (ref 0.57–1.00)
GFR calc Af Amer: 63 mL/min/{1.73_m2} (ref >59)
GFR calc non Af Amer: 54 mL/min/{1.73_m2} — ABNORMAL LOW (ref >59)
Globulin, Total: 2.2 g/dL (ref 1.5–4.5)
Glucose: 111 mg/dL — ABNORMAL HIGH (ref 65–99)
Potassium: 4.3 mmol/L (ref 3.5–5.2)
Sodium: 143 mmol/L (ref 134–144)
Total Protein: 6.4 g/dL (ref 6.0–8.5)

## 2019-01-02 LAB — T4, FREE: Free T4: 1.3 ng/dL (ref 0.82–1.77)

## 2019-01-02 LAB — INSULIN, RANDOM: INSULIN: 15.9 u[IU]/mL (ref 2.6–24.9)

## 2019-01-02 LAB — T3: T3, Total: 123 ng/dL (ref 71–180)

## 2019-01-03 NOTE — Progress Notes (Signed)
Office: [phone]  /  Fax: [phone]   Dear Dr. Larose Joseph,   Thank you for referring Lori Joseph to our clinic. The following note includes my evaluation and treatment recommendations.  HPI:   Chief Complaint: OBESITY    Lori Joseph has been referred by Lori Branch, MD for consultation regarding her obesity and obesity related comorbidities.    Lori Joseph (MR# [id number]) is a 78 y.o. female who presents on 01/01/2019 for obesity evaluation and treatment. Current BMI is Body mass index is 38.44 kg/m.Marland Kitchen Lori Joseph has been struggling with her weight for many years and has been unsuccessful in either losing weight, maintaining weight loss, or reaching her healthy weight goal.     Lori Joseph has a history of phentermine use.     Lori Joseph attended our information session and states she is currently in the action stage of change and ready to dedicate time achieving and maintaining a healthier weight. Lori Joseph is interested in becoming our patient and working on intensive lifestyle modifications including (but not limited to) diet, exercise and weight loss.    Lori Joseph states her family eats meals together she thinks her family will eat healthier with  her her desired weight loss is 42 lbs she started gaining weight 20 years ago her heaviest weight ever was 222 lbs she has significant food cravings issues  she snacks frequently in the evenings she skips meals frequently she is frequently drinking liquids with calories she frequently makes poor food choices she frequently eats larger portions than normal  she has binge eating behaviors she struggles with emotional eating    Lori Joseph feels her energy is lower than it should be. This has worsened with weight gain and has not worsened recently. [name] denies daytime somnolence and  admits to waking up still tired. Patient is at risk for obstructive sleep apnea. Patient generally gets 8 hours of sleep per night, and states they generally have generally  restful sleep. Snoring is not present. Apneic episodes are not present. Epworth Sleepiness Score is 0.  Dyspnea on exertion Lori Joseph notes increasing shortness of breath with exercising and seems to be worsening over time with weight gain. She notes getting out of breath sooner with activity than she used to. This has not gotten worse recently. EKG-Normal sinus rhythm, done on 12/02/2018. Lori Joseph denies orthopnea.  Hypertension Lori Joseph is a 78 y.o. female with hypertension. Lori Joseph is on Telmisartan, metoprolol, and hydrochlorothiazide. Her previous EKG showed normal sinus rhythm. She denies chest pain. She is working on weight loss to help control her blood pressure with the goal of decreasing her risk of heart attack and stroke. Lori Joseph's blood pressure is not currently controlled.  History of Breast Cancer (Invasive Ductal Carcinoma) Lori Joseph has a history of breast cancer and has been in remission for 4 years. She is on Anastrozole.  Depression Screen Lori Joseph's Food and Mood (modified PHQ-9) score was  Depression screen PHQ 2/9 01/01/2019  Decreased Interest 3  Down, Depressed, Hopeless 0  PHQ - 2 Score 3  Altered sleeping 0  Tired, decreased energy 3  Change in appetite 1  Feeling bad or failure about yourself  0  Trouble concentrating 3  Moving slowly or fidgety/restless 3  Suicidal thoughts 0  PHQ-9 Score 13  Difficult doing work/chores Not difficult at all  Some recent data might be hidden    ASSESSMENT AND PLAN:  Other Lori - Plan: Comprehensive metabolic panel, Insulin, random, T3, T4, free  Shortness of  breath on exertion  Essential hypertension  History of breast cancer  Depression screening  Class 2 severe obesity with serious comorbidity and body mass index (BMI) of 38.0 to 38.9 in adult, unspecified obesity type (Lori Joseph)  PLAN:  Lori Lori Joseph was informed that her Lori may be related to obesity, depression or many other causes. Labs will be ordered, and in the  meanwhile Lori Joseph has agreed to work on diet, exercise and weight loss to help with Lori. Proper sleep hygiene was discussed including the need for 7-8 hours of quality sleep each night. A sleep study was not ordered based on symptoms and Epworth score.  Dyspnea on exertion Lori Joseph's shortness of breath appears to be obesity related and exercise induced. She has agreed to work on weight loss and gradually increase exercise to treat her exercise induced shortness of breath. If Lori Joseph follows our instructions and loses weight without improvement of her shortness of breath, we will plan to refer to pulmonology. We will monitor this condition regularly. Lori Joseph agrees to this plan.  Hypertension We discussed sodium restriction, working on healthy weight loss, and a regular exercise program as the means to achieve improved blood pressure control. Lori Joseph agreed with this plan and agreed to follow up as directed. We will continue to monitor her blood pressure as well as her progress with the above lifestyle modifications. Lori Joseph will continue her medications as prescribed and will watch for signs of hypotension as she continues her lifestyle modifications. We will check CMP today. Lori Joseph agrees to follow up with our clinic in 2 weeks.  History of Breast Cancer (Invasive Ductal Carcinoma) Lori Joseph is to follow up with her doctor as needed, and she agrees to follow up with our clinic in 2 weeks.  Depression Screen Lori Joseph had a moderately positive depression screening. Depression is commonly associated with obesity and often results in emotional eating behaviors. We will monitor this closely and work on CBT to help improve the non-hunger eating patterns. Referral to Psychology may be required if no improvement is seen as she continues in our clinic.  Obesity Lori Joseph is currently in the action stage of change and her goal is to continue with weight loss efforts. I recommend Lori Joseph begin the structured treatment plan as  follows:  She has agreed to follow the Category 1 plan + 100 calories with 40 calories for creamer and 1 coke per week Detria has been instructed to eventually work up to a goal of 150 minutes of combined cardio and strengthening exercise per week for weight loss and overall health benefits. We discussed the following Behavioral Modification Strategies today: increasing lean protein intake, increasing vegetables, work on meal planning and easy cooking plans, better snacking choices, and planning for success   She was informed of the importance of frequent follow up visits to maximize her success with intensive lifestyle modifications for her multiple health conditions. She was informed we would discuss her lab results at her next visit unless there is a critical issue that needs to be addressed sooner. Dolce agreed to keep her next visit at the agreed upon time to discuss these results.  ALLERGIES: Allergies  Allergen Reactions  . Codeine Anaphylaxis  . Hyaluronic Acid  [Collagen-Chond-Hyaluronic Acid] Itching and Rash  . Carvedilol     REACTION: leg pain, edema  . Felodipine     REACTION: HA, insomnia  . Lisinopril     REACTION: HA, SWELLING  . Losartan Potassium     REACTION: cough  . Radiaplexrx [Pyridoxine-Zinc  Picolinate] Itching and Rash    MEDICATIONS: Current Outpatient Medications on File Prior to Visit  Medication Sig Dispense Refill  . anastrozole (ARIMIDEX) 1 MG tablet Take 1 tablet (1 mg total) by mouth daily. 90 tablet 3  . calcium carbonate 1250 MG capsule Take 2,500 mg by mouth every morning.     . cholecalciferol (VITAMIN D) 1000 UNITS tablet Take 2,500 Units by mouth every morning.     . dorzolamide-timolol (COSOPT) 22.3-6.8 MG/ML ophthalmic solution Place 1 drop into both eyes every morning.     . hydrochlorothiazide (HYDRODIURIL) 25 MG tablet Take 1 tablet (25 mg total) by mouth daily. 90 tablet 2  . latanoprost (XALATAN) 0.005 % ophthalmic solution PLACE 1 DROP  INTO BOTH EYES NIGHTLY.    Marland Kitchen levothyroxine (SYNTHROID, LEVOTHROID) 25 MCG tablet Take 1 tablet (25 mcg total) by mouth daily before breakfast. 90 tablet 1  . metoprolol tartrate (LOPRESSOR) 25 MG tablet Take 1 tablet (25 mg total) by mouth 2 (two) times daily. 180 tablet 2  . Multiple Vitamin (MULTIVITAMIN) tablet Take 1 tablet by mouth every morning.     . rivaroxaban (XARELTO) 20 MG TABS tablet Take 1 tablet (20 mg total) by mouth daily with supper. 90 tablet 1  . telmisartan (MICARDIS) 40 MG tablet Take 1 tablet (40 mg total) by mouth daily. 90 tablet 1   No current facility-administered medications on file prior to visit.     PAST MEDICAL HISTORY: Past Medical History:  Diagnosis Date  . Allergic rhinitis   . Anemia   . Atrial fibrillation ([place]) 03/2017  . Family history of anesthesia complication    daughter hard to wake up  . Glaucoma   . H/O acute pancreatitis   . H/O retinal detachment   . Hurthle cell neoplasm of thyroid    Right   . HYPERLIPIDEMIA   . Hypertension   . Hypothyroidism 05/31/2015  . OSTEOPENIA   . Personal history of radiation therapy 2015  . 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 Lindi Adie  . Radiation 11/11/15-12/30/14   left breast 50.4 gray, lumpectomy cavity boosted to 62.4 gray    PAST SURGICAL HISTORY: Past Surgical History:  Procedure Laterality Date  . BREAST BIOPSY Left 08/27/14  . BREAST BIOPSY Left 11/27/2016  . BREAST LUMPECTOMY Left 09/15/2014  . CHOLECYSTECTOMY  2000  . DILATION AND CURETTAGE OF UTERUS    . laser eye surgery, detached retina Left   . RADIOACTIVE SEED GUIDED PARTIAL MASTECTOMY WITH AXILLARY SENTINEL LYMPH NODE BIOPSY Left 09/16/2014   Procedure: RADIOACTIVE SEED GUIDED PARTIAL MASTECTOMY WITH AXILLARY SENTINEL LYMPH NODE BIOPSY;  Surgeon: [name], MD;  Location: [facility];  Service: General;  Laterality: Left;  . thyoidectomy Left    partial  . THYROID LOBECTOMY N/A  02/10/2015   Procedure: RIGHT THYROID LOBECTOMY;  Surgeon: [name], MD;  Location: [facility];  Service: General;  Laterality: N/A;  . TONSILLECTOMY    . TUBAL LIGATION      SOCIAL HISTORY: Social History   Tobacco Use  . Smoking status: Never Smoker  . Smokeless tobacco: Never Used  Substance Use Topics  . Alcohol use: No    Alcohol/week: 0.0 standard drinks  . Drug use: No    FAMILY HISTORY: Family History  Problem Relation Age of Onset  . Breast cancer Sister 70  . Cancer Father        lung cancer ; smoker  .  Cancer Brother        3 brothers with lung cancer, all smokers  . Cancer Paternal Uncle        2 pat uncles with lung cancer, smokers  . Kidney cancer Sister 71  . Melanoma Sister 60  . Ovarian cancer Other 20       niece with ovarian cancer (related through sister with breast cancer)  . Lori cancer Neg Hx   . CAD Neg Hx   . Esophageal cancer Neg Hx   . Stomach cancer Neg Hx   . Rectal cancer Neg Hx     ROS: Review of Systems  Constitutional: Positive for malaise/Lori. Negative for weight loss.       + Trouble sleeping + Breasts pain  HENT:       + Dentures  Eyes:       + Wear glasses or contacts  Respiratory: Positive for shortness of breath (with exertion).   Cardiovascular: Negative for chest pain and orthopnea.       + Leg cramping + Very cold feet or hands  Neurological: Positive for weakness.    PHYSICAL EXAM: Blood pressure 136/79, pulse (!) 55, temperature 97.8 F (36.6 C), temperature source Oral, height 5\' 3"  (1.6 m), weight 217 lb (98.4 kg), SpO2 97 %. Body mass index is 38.44 kg/m. Physical Exam Vitals signs reviewed.  Constitutional:      Appearance: Normal appearance. She is obese.  HENT:     Head: Normocephalic and atraumatic.     Nose: Nose normal.  Eyes:     General: No scleral icterus.    Extraocular Movements: Extraocular movements intact.  Neck:     Musculoskeletal: Normal range of motion and neck supple.      Comments: No thyromegaly present Cardiovascular:     Rate and Rhythm: Normal rate and regular rhythm.     Pulses: Normal pulses.     Heart sounds: Normal heart sounds.  Pulmonary:     Effort: Pulmonary effort is normal. No respiratory distress.     Breath sounds: Normal breath sounds.  Abdominal:     Palpations: Abdomen is soft.     Tenderness: There is no abdominal tenderness.     Comments: + Obesity  Musculoskeletal: Normal range of motion.     Right lower leg: No edema.     Left lower leg: No edema.  Skin:    General: Skin is warm and dry.  Neurological:     Mental Status: She is alert and oriented to person, place, and time.     Coordination: Coordination normal.  Psychiatric:        Mood and Affect: Mood normal.        Behavior: Behavior normal.     RECENT LABS AND TESTS: BMET    Component Value Date/Time   NA 143 01/01/2019 1336   NA 140 11/10/2015 1129   K 4.3 01/01/2019 1336   K 4.0 11/10/2015 1129   CL 105 01/01/2019 1336   CO2 22 01/01/2019 1336   CO2 23 11/10/2015 1129   GLUCOSE 111 (H) 01/01/2019 1336   GLUCOSE 105 (H) 10/09/2018 1124   GLUCOSE 98 11/10/2015 1129   BUN 29 (H) 01/01/2019 1336   BUN 17.0 11/10/2015 1129   CREATININE 1.00 01/01/2019 1336   CREATININE 0.8 11/10/2015 1129   CALCIUM 9.5 01/01/2019 1336   CALCIUM 9.2 11/10/2015 1129   GFRNONAA 54 (L) 01/01/2019 1336   GFRAA 63 01/01/2019 1336   Lab Results  Component Value Date   HGBA1C 6.0 10/09/2018   Lab Results  Component Value Date   INSULIN 15.9 01/01/2019   CBC    Component Value Date/Time   WBC 4.4 10/09/2018 1124   RBC 3.78 (L) 10/09/2018 1124   HGB 11.9 (L) 10/09/2018 1124   HGB 10.0 (L) 11/10/2015 1128   HGB 12.3 12/24/2014 1514   HCT 35.5 (L) 10/09/2018 1124   HCT 31.8 (L) 11/10/2015 1128   HCT 38.0 12/24/2014 1514   PLT 195.0 10/09/2018 1124   PLT 191 11/10/2015 1128   PLT 176 12/24/2014 1514   MCV 94.0 10/09/2018 1124   MCV 93 11/10/2015 1128   MCV 92.2  12/24/2014 1514   MCH 29.2 11/10/2015 1128   MCH 29.3 10/14/2015 1543   MCHC 33.4 10/09/2018 1124   RDW 13.6 10/09/2018 1124   RDW 14.5 11/10/2015 1128   RDW 13.7 12/24/2014 1514   LYMPHSABS 1.1 10/09/2018 1124   LYMPHSABS 0.9 11/10/2015 1128   LYMPHSABS 1.1 12/24/2014 1514   MONOABS 0.3 10/09/2018 1124   MONOABS 0.3 12/24/2014 1514   EOSABS 0.1 10/09/2018 1124   EOSABS 0.2 11/10/2015 1128   BASOSABS 0.0 10/09/2018 1124   BASOSABS 0.0 11/10/2015 1128   BASOSABS 0.0 12/24/2014 1514   Iron/TIBC/Ferritin/ %Sat    Component Value Date/Time   IRON 30 (L) 11/10/2015 1128   TIBC 265 11/10/2015 1128   FERRITIN 154 11/10/2015 1128   IRONPCTSAT 11 (L) 11/10/2015 1128   Lipid Panel     Component Value Date/Time   CHOL 174 10/09/2018 1124   TRIG 117.0 10/09/2018 1124   HDL 53.30 10/09/2018 1124   CHOLHDL 3 10/09/2018 1124   VLDL 23.4 10/09/2018 1124   LDLCALC 98 10/09/2018 1124   LDLDIRECT 177.2 08/04/2013 1147   Hepatic Function Panel     Component Value Date/Time   PROT 6.4 01/01/2019 1336   PROT 6.3 (L) 11/10/2015 1129   ALBUMIN 4.2 01/01/2019 1336   ALBUMIN 3.1 (L) 11/10/2015 1129   AST 16 01/01/2019 1336   AST 15 11/10/2015 1129   ALT 16 01/01/2019 1336   ALT 14 11/10/2015 1129   ALKPHOS 103 01/01/2019 1336   ALKPHOS 100 11/10/2015 1129   BILITOT 0.8 01/01/2019 1336   BILITOT 0.47 11/10/2015 1129   BILIDIR 0.2 10/15/2007 0000      Component Value Date/Time   TSH 3.11 10/09/2018 1124   TSH 3.11 03/25/2018 1435   TSH 3.07 09/25/2017 1615    ECG  shows NSR with a rate of 77 BPM INDIRECT CALORIMETER done today shows a VO2 of 132 and a REE of 916.  Her calculated basal metabolic rate is 7425 thus her basal metabolic rate is worse than expected.       OBESITY BEHAVIORAL INTERVENTION VISIT  Today's visit was # 1   Starting weight: 217 lbs Starting date: 01/01/2019 Today's weight : 217 lbs  Today's date: 01/01/2019 Total lbs lost to date: 0 At least 15  minutes were spent on discussing the following behavioral intervention visit.   ASK: We discussed the diagnosis of obesity with Lori Joseph today and Jeannene agreed to give [name] permission to discuss obesity behavioral modification therapy today.  ASSESS: Tanaja has the diagnosis of obesity and her BMI today is 38.45 Jessica is in the action stage of change   ADVISE: Sameeha was educated on the multiple health risks of obesity as well as the benefit of weight loss to improve her health. She was advised of  the need for long term treatment and the importance of lifestyle modifications to improve her current health and to decrease her risk of future health problems.  AGREE: Multiple dietary modification options and treatment options were discussed and  Rania agreed to follow the recommendations documented in the above note.  ARRANGE: Richardine was educated on the importance of frequent visits to treat obesity as outlined per CMS and USPSTF guidelines and agreed to schedule her next follow up appointment today.  I, Trixie Dredge, am acting as transcriptionist for Ilene Qua, MD   I have reviewed the above documentation for accuracy and completeness, and I agree with the above. - Ilene Qua, MD

## 2019-01-05 ENCOUNTER — Ambulatory Visit: Payer: Self-pay | Admitting: *Deleted

## 2019-01-05 NOTE — Telephone Encounter (Signed)
Middle finger tip looks white and feels numb when she touches something cold or is outside in the cold. Denies any other symptoms. When she warms the finger the feeling and color returns. Advice care reviewed with patient.   Reason for Disposition . Brief (now gone) pallor from being cold  Answer Assessment - Initial Assessment Questions 1. COLOR of SKIN: "What best describes the color of the skin?"     Tip of middle finger appears white and feels numb when she touches something cold or is out in the cold.  2. CHANGES: "Is this a change?" If so, ask: "How much of a change?"     Just began to notice. 3. PALLOR ELSEWHERE: "Has the color of the lips, gums or nailbeds also changed?     no 4. ONSET: When did the paleness begin?     Recently 5. CAUSE: What do you think is causing the pale skin?      6. BLEEDING: "Any recent blood loss?"     no 7. OTHER SYMPTOMS: "Do you have any other symptoms?" (e.g., weakness, dizziness, passing out, melena)     Denies all 8. PREGNANCY: "Is there any chance you are pregnant?" "When was your last menstrual period?"     na  Protocols used: PALE SKIN-A-AH

## 2019-01-07 NOTE — Progress Notes (Signed)
Patient Care Team: Colon Branch, MD as PCP - General Josue Hector, MD as PCP - Cardiology (Cardiology) [name], MD as Consulting Physician (Hematology and Oncology) [name], MD as Consulting Physician (General Surgery) Marylynn Pearson, MD as Consulting Physician (Obstetrics and Gynecology) Irene Shipper, MD as Consulting Physician (Gastroenterology) Gery Pray, MD as Consulting Physician (Radiation Oncology) Druscilla Brownie, MD as Consulting Physician (Dermatology)  DIAGNOSIS:    ICD-10-CM   1. 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 with above-mentioned history of left breast cancer treated with lumpectomy and is currently on anastrozole therapy. I last saw the patient one year ago. Her most recent mammogram from 12/12/18 showed no evidence of malignancy. She presents to the clinic alone today. She is currently in the [facility] weight loss program and has lost 5 pounds by altering her diet. She denies any pain in her breasts. She notes she doesn't have the energy to walk or exercise a lot. Her sister recently died from breast cancer that she refused treatment for and it has caused her a lot of stress.   REVIEW OF SYSTEMS:   Constitutional: Denies fevers, chills or abnormal weight loss Eyes: Denies  blurriness of vision Ears, nose, mouth, throat, and face: Denies mucositis or sore throat Respiratory: Denies cough, dyspnea or wheezes Cardiovascular: Denies palpitation, chest discomfort Gastrointestinal: Denies nausea, heartburn or change in bowel habits Skin: Denies abnormal skin rashes Lymphatics: Denies new lymphadenopathy or easy bruising Neurological: Denies numbness, tingling or new weaknesses Behavioral/Psych: Mood is stable, no new changes  Extremities: No lower extremity edema Breast: denies any pain or lumps or nodules in either breasts All other systems were reviewed with the patient and are negative.  I have reviewed the past medical history, past surgical history, social history and family history with the patient and they are unchanged from previous note.  ALLERGIES:  is allergic to codeine; hyaluronic acid  [collagen-chond-hyaluronic acid]; carvedilol; felodipine; lisinopril; losartan potassium; and radiaplexrx [pyridoxine-zinc picolinate].  MEDICATIONS:  Current Outpatient Medications  Medication Sig Dispense Refill  . anastrozole (ARIMIDEX) 1 MG tablet Take 1 tablet (1 mg total) by mouth daily. 90 tablet 3  . calcium carbonate 1250 MG capsule Take 2,500 mg by mouth every morning.     . cholecalciferol (VITAMIN D) 1000 UNITS tablet Take 2,500 Units by mouth every morning.     . dorzolamide-timolol (COSOPT) 22.3-6.8 MG/ML ophthalmic solution Place 1 drop into both eyes every morning.     . hydrochlorothiazide (HYDRODIURIL) 25 MG tablet Take 1 tablet (25 mg total) by mouth daily. 90 tablet 2  . latanoprost (XALATAN) 0.005 % ophthalmic solution PLACE 1 DROP INTO BOTH EYES NIGHTLY.    Marland Kitchen levothyroxine (SYNTHROID, LEVOTHROID) 25 MCG tablet Take 1  tablet (25 mcg total) by mouth daily before breakfast. 90 tablet 1  . metoprolol tartrate (LOPRESSOR) 25 MG tablet Take 1 tablet (25 mg total) by mouth 2 (two) times daily. 180 tablet 2  . Multiple Vitamin (MULTIVITAMIN) tablet Take  1 tablet by mouth every morning.     . pravastatin (PRAVACHOL) 40 MG tablet TAKE 1 TABLET (40 MG TOTAL) BY MOUTH DAILY. 90 tablet 1  . rivaroxaban (XARELTO) 20 MG TABS tablet Take 1 tablet (20 mg total) by mouth daily with supper. 90 tablet 1  . telmisartan (MICARDIS) 40 MG tablet Take 1 tablet (40 mg total) by mouth daily. 90 tablet 1   No current facility-administered medications for this visit.     PHYSICAL EXAMINATION: ECOG PERFORMANCE STATUS: 1 - Symptomatic but completely ambulatory  Vitals:   01/13/19 1547  BP: 122/78  Pulse: (!) 58  Resp: 18  Temp: 98.7 F (37.1 C)  SpO2: 100%   Filed Weights   01/13/19 1547  Weight: 216 lb 9.6 oz (98.2 kg)    GENERAL: alert, no distress and comfortable SKIN: skin color, texture, turgor are normal, no rashes or significant lesions EYES: normal, Conjunctiva are pink and non-injected, sclera clear OROPHARYNX: no exudate, no erythema and lips, buccal mucosa, and tongue normal  NECK: supple, thyroid normal size, non-tender, without nodularity LYMPH: no palpable lymphadenopathy in the cervical, axillary or inguinal LUNGS: clear to auscultation and percussion with normal breathing effort HEART: Atrial fibrillation ABDOMEN: abdomen soft, non-tender and normal bowel sounds MUSCULOSKELETAL: no cyanosis of digits and no clubbing  NEURO: alert & oriented x 3 with fluent speech, no focal motor/sensory deficits EXTREMITIES: No lower extremity edema BREAST: No palpable masses or nodules in either right or left breasts. No palpable axillary supraclavicular or infraclavicular adenopathy no breast tenderness or nipple discharge. (exam performed in the presence of a chaperone)   LABORATORY DATA:  I have reviewed the data as listed CMP Latest Ref Rng & Units 01/01/2019 10/09/2018 03/25/2018  Glucose 65 - 99 mg/dL 111(H) 105(H) 89  BUN 8 - 27 mg/dL 29(H) 22 26(H)  Creatinine 0.57 - 1.00 mg/dL 1.00 0.87 1.05  Sodium 134 - 144 mmol/L 143 140 141    Potassium 3.5 - 5.2 mmol/L 4.3 3.9 4.1  Chloride 96 - 106 mmol/L 105 104 106  CO2 20 - 29 mmol/L 22 27 26   Calcium 8.7 - 10.3 mg/dL 9.5 9.4 9.5  Total Protein 6.0 - 8.5 g/dL 6.4 6.1 -  Total Bilirubin 0.0 - 1.2 mg/dL 0.8 0.9 -  Alkaline Phos 39 - 117 IU/L 103 88 -  AST 0 - 40 IU/L 16 18 -  ALT 0 - 32 IU/L 16 15 -    Lab Results  Component Value Date   WBC 4.4 10/09/2018   HGB 11.9 (L) 10/09/2018   HCT 35.5 (L) 10/09/2018   MCV 94.0 10/09/2018   PLT 195.0 10/09/2018   NEUTROABS 2.9 10/09/2018    ASSESSMENT & PLAN:  Breast cancer of upper-outer quadrant of left female breast Left breast lumpectomy 09/14/2014: 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 N0 M0 Oncotype DX recurrence score 15, 9% ROR Currently on anastrozole 1 mg daily started 01/25/2015.  Anastrozole toxicities: Patient denies any problems or concerns taking Anastrozole. She denies any hot flashes or myalgias. Breast cancer surveillance: 1. Breast exam2/18/2020 no palpable lumps or nodules.  Breast biopsy on 01/24/2018was benign. 2. Mammograms  12/12/2018:C no evidence of malignancy, breast density category C   Her sister recently passed away with bilateral breast cancers because she did not seek any treatment.  Patient is very sad about that. Atrial fibrillation: Currently on cardiac medications on Xarelto.   Return to clinic in1 yrfor follow up    No orders of the defined types were placed in this encounter.  The patient has a good understanding of the overall plan. she agrees with it. she will call with any problems that may develop before the next visit here.  [name], MD 01/13/2019  Julious Oka Dorshimer am acting as scribe for Dr. [name].  I have reviewed the above documentation for accuracy and completeness, and I agree with the above.

## 2019-01-13 ENCOUNTER — Inpatient Hospital Stay: Payer: Medicare Other | Attending: Hematology and Oncology | Admitting: Hematology and Oncology

## 2019-01-13 ENCOUNTER — Telehealth: Payer: Self-pay | Admitting: Hematology and Oncology

## 2019-01-13 DIAGNOSIS — Z170 Estrogen receptor positive status [ER+]: Secondary | ICD-10-CM | POA: Insufficient documentation

## 2019-01-13 DIAGNOSIS — Z79811 Long term (current) use of aromatase inhibitors: Secondary | ICD-10-CM | POA: Diagnosis not present

## 2019-01-13 DIAGNOSIS — Z7901 Long term (current) use of anticoagulants: Secondary | ICD-10-CM

## 2019-01-13 DIAGNOSIS — I4891 Unspecified atrial fibrillation: Secondary | ICD-10-CM | POA: Diagnosis not present

## 2019-01-13 DIAGNOSIS — Z85850 Personal history of malignant neoplasm of thyroid: Secondary | ICD-10-CM | POA: Diagnosis not present

## 2019-01-13 DIAGNOSIS — C50412 Malignant neoplasm of upper-outer quadrant of left female breast: Secondary | ICD-10-CM

## 2019-01-13 MED ORDER — ANASTROZOLE 1 MG PO TABS: 1.0000 mg | ORAL_TABLET | Freq: Every day | ORAL | 3 refills | Status: DC

## 2019-01-13 NOTE — Assessment & Plan Note (Signed)
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 N0 M0 Oncotype DX recurrence score 15, 9% ROR Currently on anastrozole 1 mg daily started 01/25/2015.  Anastrozole toxicities: Patient denies any problems or concerns taking Anastrozole. She denies any hot flashes or myalgias. Breast cancer surveillance: 1. Breast exam2/18/2020 no palpable lumps or nodules.  Breast biopsy on 01/24/2018was benign. 2. Mammograms 12/12/2018:C no evidence of malignancy, breast density category C    Atrial fibrillation: Currently on cardiac medications on Xarelto.   Return to clinic in1 yrfor follow up

## 2019-01-13 NOTE — Telephone Encounter (Signed)
Gave avs and calendar ° °

## 2019-01-15 ENCOUNTER — Ambulatory Visit (INDEPENDENT_AMBULATORY_CARE_PROVIDER_SITE_OTHER): Payer: Medicare Other | Admitting: Family Medicine

## 2019-01-15 ENCOUNTER — Encounter (INDEPENDENT_AMBULATORY_CARE_PROVIDER_SITE_OTHER): Payer: Self-pay | Admitting: Family Medicine

## 2019-01-15 VITALS — BP 112/68 | HR 74 | Temp 98.1°F | Ht 63.0 in | Wt 212.0 lb

## 2019-01-15 DIAGNOSIS — R7303 Prediabetes: Secondary | ICD-10-CM | POA: Diagnosis not present

## 2019-01-15 DIAGNOSIS — Z6837 Body mass index (BMI) 37.0-37.9, adult: Secondary | ICD-10-CM

## 2019-01-15 DIAGNOSIS — E559 Vitamin D deficiency, unspecified: Secondary | ICD-10-CM | POA: Diagnosis not present

## 2019-01-15 DIAGNOSIS — I10 Essential (primary) hypertension: Secondary | ICD-10-CM

## 2019-01-15 MED ORDER — VITAMIN D (ERGOCALCIFEROL) 1.25 MG (50000 UNIT) PO CAPS: 50000.0000 [IU] | ORAL_CAPSULE | ORAL | 0 refills | Status: DC

## 2019-01-19 ENCOUNTER — Telehealth: Payer: Self-pay | Admitting: *Deleted

## 2019-01-19 NOTE — Telephone Encounter (Signed)
Patient called and stated that one of her family members was having a baby and was told that she needs to get the shot with the pertussis in it.  Reviewed chart and she only got the Td.  Advised patient that she needed to go to pharmacy (pt has medicare) to get vaccine for TDAP.

## 2019-01-19 NOTE — Progress Notes (Signed)
Office: [phone]  /  Fax: [phone]   HPI:   Chief Complaint: OBESITY Lori Joseph is here to discuss her progress with her obesity treatment plan. She is on the Category 1 plan + 100 calories with 40 calories for creamer and 1 coke per week and is following her eating plan approximately 90-95 % of the time. She states she is exercising 0 minutes 0 times per week. Lori Joseph is following the meal plan loosely, not weighing meat and is doing 2 eggs with [place] sausage or bacon. Her husband reports that she isn't eating enough meat.  Her weight is 212 lb (96.2 kg) today and has had a weight loss of 5 pounds over a period of 2 weeks since her last visit. She has lost 5 lbs since starting treatment with [name].  Hypertension Lori Joseph is a 78 y.o. female with hypertension. Lori Joseph's blood pressure is controlled. She denies chest pain, chest pressure, or headaches. She is working on weight loss to help control her blood pressure with the goal of decreasing her risk of heart attack and stroke.   Pre-Diabetes Lori Joseph has a diagnosis of pre-diabetes based on her elevated Hgb A1c and was informed this puts her at greater risk of developing diabetes. She is not on medications and notes carbohydrate cravings but better controlled. She continues to work on diet and exercise to decrease risk of diabetes. She denies nausea or hypoglycemia.  Vitamin D Deficiency Lori Joseph has a diagnosis of vitamin D deficiency. She is currently taking OTC Vit D 2,000 IU daily. She notes fatigue and denies nausea, vomiting or muscle weakness.  ASSESSMENT AND PLAN:  Essential hypertension  Prediabetes  Vitamin D deficiency - Plan: Vitamin D, Ergocalciferol, (DRISDOL) 1.25 MG (50000 UT) CAPS capsule  Class 2 severe obesity with serious comorbidity and body mass index (BMI) of 37.0 to 37.9 in adult, unspecified obesity type ([place])  PLAN:  Hypertension We discussed sodium restriction, working on healthy weight loss, and a regular  exercise program as the means to achieve improved blood pressure control. Lori Joseph agreed with this plan and agreed to follow up as directed. We will continue to monitor her blood pressure as well as her progress with the above lifestyle modifications. Lori Joseph will continue her current medications and will watch for signs of hypotension as she continues her lifestyle modifications. Lori Joseph agrees to follow up with our clinic in 2 weeks.  Pre-Diabetes Lori Joseph will continue to work on weight loss, exercise, and decreasing simple carbohydrates in her diet to help decrease the risk of diabetes. We dicussed metformin including benefits and risks. She was informed that eating too many simple carbohydrates or too many calories at one sitting increases the likelihood of GI side effects. [name] declined metformin for now and a prescription was not written today. She would like to retest Hgb A1c and insulin in 3 months. Lori Joseph agrees to follow up with our clinic in 2 weeks as directed to monitor her progress.  Vitamin D Deficiency Lori Joseph was informed that low vitamin D levels contributes to fatigue and are associated with obesity, breast, and colon cancer. Lori Joseph agrees to start prescription Vit D @50 ,000 IU every week #4 with no refills. She will follow up for routine testing of vitamin D, at least 2-3 times per year. She was informed of the risk of over-replacement of vitamin D and agrees to not increase her dose unless she discusses this with [name] first. Lori Joseph agrees to follow up with our clinic in 2 weeks.  Obesity Lori Joseph is currently in the action stage of change. As such, her goal is to continue with weight loss efforts She has agreed to follow the Category 1 plan Lori Joseph has been instructed to work up to a goal of 150 minutes of combined cardio and strengthening exercise per week for weight loss and overall health benefits. We discussed the following Behavioral Modification Strategies today: increasing lean protein intake,  increasing vegetables and work on meal planning and easy cooking plans, and planning for success   Lori Joseph has agreed to follow up with our clinic in 2 weeks. She was informed of the importance of frequent follow up visits to maximize her success with intensive lifestyle modifications for her multiple health conditions.  ALLERGIES: Allergies  Allergen Reactions  . Codeine Anaphylaxis  . Hyaluronic Acid  [Collagen-Chond-Hyaluronic Acid] Itching and Rash  . Carvedilol     REACTION: leg pain, edema  . Felodipine     REACTION: HA, insomnia  . Lisinopril     REACTION: HA, SWELLING  . Losartan Potassium     REACTION: cough  . Radiaplexrx [Pyridoxine-Zinc Picolinate] Itching and Rash    MEDICATIONS: Current Outpatient Medications on File Prior to Visit  Medication Sig Dispense Refill  . anastrozole (ARIMIDEX) 1 MG tablet Take 1 tablet (1 mg total) by mouth daily. 90 tablet 3  . calcium carbonate 1250 MG capsule Take 2,500 mg by mouth every morning.     . cholecalciferol (VITAMIN D) 1000 UNITS tablet Take 2,500 Units by mouth every morning.     . dorzolamide-timolol (COSOPT) 22.3-6.8 MG/ML ophthalmic solution Place 1 drop into both eyes every morning.     . hydrochlorothiazide (HYDRODIURIL) 25 MG tablet Take 1 tablet (25 mg total) by mouth daily. 90 tablet 2  . latanoprost (XALATAN) 0.005 % ophthalmic solution PLACE 1 DROP INTO BOTH EYES NIGHTLY.    Marland Kitchen levothyroxine (SYNTHROID, LEVOTHROID) 25 MCG tablet Take 1 tablet (25 mcg total) by mouth daily before breakfast. 90 tablet 1  . metoprolol tartrate (LOPRESSOR) 25 MG tablet Take 1 tablet (25 mg total) by mouth 2 (two) times daily. 180 tablet 2  . Multiple Vitamin (MULTIVITAMIN) tablet Take 1 tablet by mouth every morning.     . pravastatin (PRAVACHOL) 40 MG tablet TAKE 1 TABLET (40 MG TOTAL) BY MOUTH DAILY. 90 tablet 1  . rivaroxaban (XARELTO) 20 MG TABS tablet Take 1 tablet (20 mg total) by mouth daily with supper. 90 tablet 1  . telmisartan  (MICARDIS) 40 MG tablet Take 1 tablet (40 mg total) by mouth daily. 90 tablet 1   No current facility-administered medications on file prior to visit.     PAST MEDICAL HISTORY: Past Medical History:  Diagnosis Date  . Allergic rhinitis   . Anemia   . Atrial fibrillation ([place]) 03/2017  . Family history of anesthesia complication    daughter hard to wake up  . Glaucoma   . H/O acute pancreatitis   . H/O retinal detachment   . Hurthle cell neoplasm of thyroid    Right   . HYPERLIPIDEMIA   . Hypertension   . Hypothyroidism 05/31/2015  . OSTEOPENIA   . Personal history of radiation therapy 2015  . Primary cancer of upper outer quadrant of left female breast ([place])    Dr. Lindi Adie  . Radiation 11/11/15-12/30/14   left breast 50.4 gray, lumpectomy cavity boosted to 62.4 gray    PAST SURGICAL HISTORY: Past Surgical History:  Procedure Laterality Date  . BREAST BIOPSY Left 08/27/14  .  BREAST BIOPSY Left 11/27/2016  . BREAST LUMPECTOMY Left 09/15/2014  . CHOLECYSTECTOMY  2000  . DILATION AND CURETTAGE OF UTERUS    . laser eye surgery, detached retina Left   . RADIOACTIVE SEED GUIDED PARTIAL MASTECTOMY WITH AXILLARY SENTINEL LYMPH NODE BIOPSY Left 09/16/2014   Procedure: RADIOACTIVE SEED GUIDED PARTIAL MASTECTOMY WITH AXILLARY SENTINEL LYMPH NODE BIOPSY;  Surgeon: [name], MD;  Location: [facility];  Service: General;  Laterality: Left;  . thyoidectomy Left    partial  . THYROID LOBECTOMY N/A 02/10/2015   Procedure: RIGHT THYROID LOBECTOMY;  Surgeon: [name], MD;  Location: [facility];  Service: General;  Laterality: N/A;  . TONSILLECTOMY    . TUBAL LIGATION      SOCIAL HISTORY: Social History   Tobacco Use  . Smoking status: Never Smoker  . Smokeless tobacco: Never Used  Substance Use Topics  . Alcohol use: No    Alcohol/week: 0.0 standard drinks  . Drug use: No    FAMILY HISTORY: Family History  Problem Relation Age of Onset  . Breast cancer Sister 76   . Cancer Father        lung cancer ; smoker  . Cancer Brother        3 brothers with lung cancer, all smokers  . Cancer Paternal Uncle        2 pat uncles with lung cancer, smokers  . Kidney cancer Sister 62  . Melanoma Sister 45  . Ovarian cancer Other 85       niece with ovarian cancer (related through sister with breast cancer)  . Colon cancer Neg Hx   . CAD Neg Hx   . Esophageal cancer Neg Hx   . Stomach cancer Neg Hx   . Rectal cancer Neg Hx     ROS: Review of Systems  Constitutional: Positive for malaise/fatigue and weight loss.  Cardiovascular: Negative for chest pain.       Negative chest pressure  Gastrointestinal: Negative for nausea and vomiting.  Musculoskeletal:       Negative muscle weakness  Neurological: Negative for headaches.  Endo/Heme/Allergies:       Negative hypoglycemia    PHYSICAL EXAM: Blood pressure 112/68, pulse 74, temperature 98.1 F (36.7 C), temperature source Oral, height 5\' 3"  (1.6 m), weight 212 lb (96.2 kg), SpO2 91 %. Body mass index is 37.55 kg/m. Physical Exam Vitals signs reviewed.  Constitutional:      Appearance: Normal appearance. She is obese.  Cardiovascular:     Rate and Rhythm: Normal rate.     Pulses: Normal pulses.  Pulmonary:     Effort: Pulmonary effort is normal.     Breath sounds: Normal breath sounds.  Musculoskeletal: Normal range of motion.  Skin:    General: Skin is warm and dry.  Neurological:     Mental Status: She is alert and oriented to person, place, and time.  Psychiatric:        Mood and Affect: Mood normal.        Behavior: Behavior normal.     RECENT LABS AND TESTS: BMET    Component Value Date/Time   NA 143 01/01/2019 1336   NA 140 11/10/2015 1129   K 4.3 01/01/2019 1336   K 4.0 11/10/2015 1129   CL 105 01/01/2019 1336   CO2 22 01/01/2019 1336   CO2 23 11/10/2015 1129   GLUCOSE 111 (H) 01/01/2019 1336   GLUCOSE 105 (H) 10/09/2018 1124   GLUCOSE 98 11/10/2015 1129  BUN 29 (H)  01/01/2019 1336   BUN 17.0 11/10/2015 1129   CREATININE 1.00 01/01/2019 1336   CREATININE 0.8 11/10/2015 1129   CALCIUM 9.5 01/01/2019 1336   CALCIUM 9.2 11/10/2015 1129   GFRNONAA 54 (L) 01/01/2019 1336   GFRAA 63 01/01/2019 1336   Lab Results  Component Value Date   HGBA1C 6.0 10/09/2018   HGBA1C 5.8 04/25/2017   HGBA1C 5.7 08/24/2015   Lab Results  Component Value Date   INSULIN 15.9 01/01/2019   CBC    Component Value Date/Time   WBC 4.4 10/09/2018 1124   RBC 3.78 (L) 10/09/2018 1124   HGB 11.9 (L) 10/09/2018 1124   HGB 10.0 (L) 11/10/2015 1128   HGB 12.3 12/24/2014 1514   HCT 35.5 (L) 10/09/2018 1124   HCT 31.8 (L) 11/10/2015 1128   HCT 38.0 12/24/2014 1514   PLT 195.0 10/09/2018 1124   PLT 191 11/10/2015 1128   PLT 176 12/24/2014 1514   MCV 94.0 10/09/2018 1124   MCV 93 11/10/2015 1128   MCV 92.2 12/24/2014 1514   MCH 29.2 11/10/2015 1128   MCH 29.3 10/14/2015 1543   MCHC 33.4 10/09/2018 1124   RDW 13.6 10/09/2018 1124   RDW 14.5 11/10/2015 1128   RDW 13.7 12/24/2014 1514   LYMPHSABS 1.1 10/09/2018 1124   LYMPHSABS 0.9 11/10/2015 1128   LYMPHSABS 1.1 12/24/2014 1514   MONOABS 0.3 10/09/2018 1124   MONOABS 0.3 12/24/2014 1514   EOSABS 0.1 10/09/2018 1124   EOSABS 0.2 11/10/2015 1128   BASOSABS 0.0 10/09/2018 1124   BASOSABS 0.0 11/10/2015 1128   BASOSABS 0.0 12/24/2014 1514   Iron/TIBC/Ferritin/ %Sat    Component Value Date/Time   IRON 30 (L) 11/10/2015 1128   TIBC 265 11/10/2015 1128   FERRITIN 154 11/10/2015 1128   IRONPCTSAT 11 (L) 11/10/2015 1128   Lipid Panel     Component Value Date/Time   CHOL 174 10/09/2018 1124   TRIG 117.0 10/09/2018 1124   HDL 53.30 10/09/2018 1124   CHOLHDL 3 10/09/2018 1124   VLDL 23.4 10/09/2018 1124   LDLCALC 98 10/09/2018 1124   LDLDIRECT 177.2 08/04/2013 1147   Hepatic Function Panel     Component Value Date/Time   PROT 6.4 01/01/2019 1336   PROT 6.3 (L) 11/10/2015 1129   ALBUMIN 4.2 01/01/2019 1336    ALBUMIN 3.1 (L) 11/10/2015 1129   AST 16 01/01/2019 1336   AST 15 11/10/2015 1129   ALT 16 01/01/2019 1336   ALT 14 11/10/2015 1129   ALKPHOS 103 01/01/2019 1336   ALKPHOS 100 11/10/2015 1129   BILITOT 0.8 01/01/2019 1336   BILITOT 0.47 11/10/2015 1129   BILIDIR 0.2 10/15/2007 0000      Component Value Date/Time   TSH 3.11 10/09/2018 1124   TSH 3.11 03/25/2018 1435   TSH 3.07 09/25/2017 1615      OBESITY BEHAVIORAL INTERVENTION VISIT  Today's visit was # 2   Starting weight: 217 lbs Starting date: 01/01/2019 Today's weight : 212 lbs Today's date: 01/15/2019 Total lbs lost to date: 5 At least 15 minutes were spent on discussing the following behavioral intervention visit.   ASK: We discussed the diagnosis of obesity with Lori Joseph today and Lori Joseph agreed to give [name] permission to discuss obesity behavioral modification therapy today.  ASSESS: Lori Joseph has the diagnosis of obesity and her BMI today is 37.56 Lori Joseph is in the action stage of change   ADVISE: Lori Joseph was educated on the multiple health risks of  obesity as well as the benefit of weight loss to improve her health. She was advised of the need for long term treatment and the importance of lifestyle modifications to improve her current health and to decrease her risk of future health problems.  AGREE: Multiple dietary modification options and treatment options were discussed and  Lori Joseph agreed to follow the recommendations documented in the above note.  ARRANGE: Lori Joseph was educated on the importance of frequent visits to treat obesity as outlined per CMS and USPSTF guidelines and agreed to schedule her next follow up appointment today.  I, Trixie Dredge, am acting as transcriptionist for Ilene Qua, MD  I have reviewed the above documentation for accuracy and completeness, and I agree with the above. - Ilene Qua, MD

## 2019-01-29 DIAGNOSIS — N870 Mild cervical dysplasia: Secondary | ICD-10-CM | POA: Diagnosis not present

## 2019-02-03 ENCOUNTER — Ambulatory Visit (INDEPENDENT_AMBULATORY_CARE_PROVIDER_SITE_OTHER): Payer: Medicare Other | Admitting: Family Medicine

## 2019-02-03 ENCOUNTER — Encounter (INDEPENDENT_AMBULATORY_CARE_PROVIDER_SITE_OTHER): Payer: Self-pay | Admitting: Family Medicine

## 2019-02-03 VITALS — BP 126/77 | HR 71 | Temp 98.1°F | Ht 63.0 in | Wt 207.0 lb

## 2019-02-03 DIAGNOSIS — Z6836 Body mass index (BMI) 36.0-36.9, adult: Secondary | ICD-10-CM | POA: Diagnosis not present

## 2019-02-03 DIAGNOSIS — E559 Vitamin D deficiency, unspecified: Secondary | ICD-10-CM | POA: Diagnosis not present

## 2019-02-03 DIAGNOSIS — K5909 Other constipation: Secondary | ICD-10-CM | POA: Diagnosis not present

## 2019-02-03 MED ORDER — VITAMIN D (ERGOCALCIFEROL) 1.25 MG (50000 UNIT) PO CAPS: 50000.0000 [IU] | ORAL_CAPSULE | ORAL | 0 refills | Status: DC

## 2019-02-03 NOTE — Progress Notes (Signed)
Office: [phone]  /  Fax: [phone]   HPI:   Chief Complaint: OBESITY Lori Joseph is here to discuss her progress with her obesity treatment plan. She is on the Category 1 plan and is following her eating plan approximately 75 % of the time. She states she is exercising 0 minutes 0 times per week. Lori Joseph is working on getting protein in, but she is normally falling short on protein of lunch and dinner. She is voicing that she has had some issues with constipation. She is barely getting 4 oz of meat total.  Her weight is 207 lb (93.9 kg) today and has had a weight loss of 5 pounds over a period of 2 to 3 weeks since her last visit. She has lost 10 lbs since starting treatment with [name].  Vitamin D Deficiency Lori Joseph has a diagnosis of vitamin D deficiency. She is currently taking prescription Vit D. She notes fatigue and denies nausea, vomiting or muscle weakness.  Constipation Lori Joseph notes constipation and a decrease in frequency of BM. She notes minimal straining. She denies hematochezia or melena. She denies drinking less H20 recently.  ASSESSMENT AND PLAN:  Vitamin D deficiency - Plan: Vitamin D, Ergocalciferol, (DRISDOL) 1.25 MG (50000 UT) CAPS capsule  Other constipation  Class 2 severe obesity with serious comorbidity and body mass index (BMI) of 36.0 to 36.9 in adult, unspecified obesity type (Lori Joseph)  PLAN:  Vitamin D Deficiency Lori Joseph was informed that low vitamin D levels contributes to fatigue and are associated with obesity, breast, and colon cancer. Lori Joseph agrees to continue taking prescription Vit D @50 ,000 IU every week, no refill needed. She will follow up for routine testing of vitamin D, at least 2-3 times per year. She was informed of the risk of over-replacement of vitamin D and agrees to not increase her dose unless she discusses this with [name] first. Lori Joseph agrees to follow up with our clinic in 2 weeks.  Constipation Lori Joseph was informed decrease bowel movement frequency is  normal while losing weight, but stools should not be hard or painful. She was advised to increase her H20 intake and work on increasing her fiber intake. High fiber foods were discussed today. Lori Joseph is to try Benefiber 3 to 4 days, then miralax 17 g PO daily for 3 to 4 days #1 bottle, with no refills. Lori Joseph agrees to follow up with our clinic in 2 weeks.  Obesity Lori Joseph is currently in the action stage of change. As such, her goal is to continue with weight loss efforts She has agreed to follow the Category 1 plan with Pescatarian dinner options Lori Joseph has been instructed to work up to a goal of 150 minutes of combined cardio and strengthening exercise per week for weight loss and overall health benefits. We discussed the following Behavioral Modification Strategies today: increasing lean protein intake, increasing vegetables, work on meal planning and easy cooking plans, better snacking choices, and planning for success   Lori Joseph has agreed to follow up with our clinic in 2 weeks. She was informed of the importance of frequent follow up visits to maximize her success with intensive lifestyle modifications for her multiple health conditions.  ALLERGIES: Allergies  Allergen Reactions  . Codeine Anaphylaxis  . Hyaluronic Acid  [Collagen-Chond-Hyaluronic Acid] Itching and Rash  . Carvedilol     REACTION: leg pain, edema  . Felodipine     REACTION: HA, insomnia  . Lisinopril     REACTION: HA, SWELLING  . Losartan Potassium  REACTION: cough  . Radiaplexrx [Pyridoxine-Zinc Picolinate] Itching and Rash    MEDICATIONS: Current Outpatient Medications on File Prior to Visit  Medication Sig Dispense Refill  . anastrozole (ARIMIDEX) 1 MG tablet Take 1 tablet (1 mg total) by mouth daily. 90 tablet 3  . calcium carbonate 1250 MG capsule Take 2,500 mg by mouth every morning.     . cholecalciferol (VITAMIN D) 1000 UNITS tablet Take 2,500 Units by mouth every morning.     . dorzolamide-timolol  (COSOPT) 22.3-6.8 MG/ML ophthalmic solution Place 1 drop into both eyes every morning.     . hydrochlorothiazide (HYDRODIURIL) 25 MG tablet Take 1 tablet (25 mg total) by mouth daily. 90 tablet 2  . latanoprost (XALATAN) 0.005 % ophthalmic solution PLACE 1 DROP INTO BOTH EYES NIGHTLY.    Marland Kitchen levothyroxine (SYNTHROID, LEVOTHROID) 25 MCG tablet Take 1 tablet (25 mcg total) by mouth daily before breakfast. 90 tablet 1  . metoprolol tartrate (LOPRESSOR) 25 MG tablet Take 1 tablet (25 mg total) by mouth 2 (two) times daily. 180 tablet 2  . Multiple Vitamin (MULTIVITAMIN) tablet Take 1 tablet by mouth every morning.     . pravastatin (PRAVACHOL) 40 MG tablet TAKE 1 TABLET (40 MG TOTAL) BY MOUTH DAILY. 90 tablet 1  . rivaroxaban (XARELTO) 20 MG TABS tablet Take 1 tablet (20 mg total) by mouth daily with supper. 90 tablet 1  . telmisartan (MICARDIS) 40 MG tablet Take 1 tablet (40 mg total) by mouth daily. 90 tablet 1   No current facility-administered medications on file prior to visit.     PAST MEDICAL HISTORY: Past Medical History:  Diagnosis Date  . Allergic rhinitis   . Anemia   . Atrial fibrillation ([place]) 03/2017  . Family history of anesthesia complication    daughter hard to wake up  . Glaucoma   . H/O acute pancreatitis   . H/O retinal detachment   . Hurthle cell neoplasm of thyroid    Right   . HYPERLIPIDEMIA   . Hypertension   . Hypothyroidism 05/31/2015  . OSTEOPENIA   . Personal history of radiation therapy 2015  . Primary cancer of upper outer quadrant of left female breast ([place])    Dr. Lindi Adie  . Radiation 11/11/15-12/30/14   left breast 50.4 gray, lumpectomy cavity boosted to 62.4 gray    PAST SURGICAL HISTORY: Past Surgical History:  Procedure Laterality Date  . BREAST BIOPSY Left 08/27/14  . BREAST BIOPSY Left 11/27/2016  . BREAST LUMPECTOMY Left 09/15/2014  . CHOLECYSTECTOMY  2000  . DILATION AND CURETTAGE OF UTERUS    . laser eye surgery, detached retina Left   .  RADIOACTIVE SEED GUIDED PARTIAL MASTECTOMY WITH AXILLARY SENTINEL LYMPH NODE BIOPSY Left 09/16/2014   Procedure: RADIOACTIVE SEED GUIDED PARTIAL MASTECTOMY WITH AXILLARY SENTINEL LYMPH NODE BIOPSY;  Surgeon: [name], MD;  Location: [facility];  Service: General;  Laterality: Left;  . thyoidectomy Left    partial  . THYROID LOBECTOMY N/A 02/10/2015   Procedure: RIGHT THYROID LOBECTOMY;  Surgeon: [name], MD;  Location: [facility];  Service: General;  Laterality: N/A;  . TONSILLECTOMY    . TUBAL LIGATION      SOCIAL HISTORY: Social History   Tobacco Use  . Smoking status: Never Smoker  . Smokeless tobacco: Never Used  Substance Use Topics  . Alcohol use: No    Alcohol/week: 0.0 standard drinks  . Drug use: No    FAMILY HISTORY: Family History  Problem Relation Age  of Onset  . Breast cancer Sister 4  . Cancer Father        lung cancer ; smoker  . Cancer Brother        3 brothers with lung cancer, all smokers  . Cancer Paternal Uncle        2 pat uncles with lung cancer, smokers  . Kidney cancer Sister 26  . Melanoma Sister 67  . Ovarian cancer Other 64       niece with ovarian cancer (related through sister with breast cancer)  . Colon cancer Neg Hx   . CAD Neg Hx   . Esophageal cancer Neg Hx   . Stomach cancer Neg Hx   . Rectal cancer Neg Hx     ROS: Review of Systems  Constitutional: Positive for malaise/fatigue and weight loss.  Gastrointestinal: Positive for constipation. Negative for melena, nausea and vomiting.       Negative hematochezia  Musculoskeletal:       Negative muscle weakness    PHYSICAL EXAM: Blood pressure 126/77, pulse 71, temperature 98.1 F (36.7 C), temperature source Oral, height 5\' 3"  (1.6 m), weight 207 lb (93.9 kg), SpO2 99 %. Body mass index is 36.67 kg/m. Physical Exam Vitals signs reviewed.  Constitutional:      Appearance: Normal appearance. She is obese.  Cardiovascular:     Rate and Rhythm: Normal rate.       Pulses: Normal pulses.  Pulmonary:     Effort: Pulmonary effort is normal.     Breath sounds: Normal breath sounds.  Musculoskeletal: Normal range of motion.  Skin:    General: Skin is warm and dry.  Neurological:     Mental Status: She is alert and oriented to person, place, and time.  Psychiatric:        Mood and Affect: Mood normal.        Behavior: Behavior normal.     RECENT LABS AND TESTS: BMET    Component Value Date/Time   NA 143 01/01/2019 1336   NA 140 11/10/2015 1129   K 4.3 01/01/2019 1336   K 4.0 11/10/2015 1129   CL 105 01/01/2019 1336   CO2 22 01/01/2019 1336   CO2 23 11/10/2015 1129   GLUCOSE 111 (H) 01/01/2019 1336   GLUCOSE 105 (H) 10/09/2018 1124   GLUCOSE 98 11/10/2015 1129   BUN 29 (H) 01/01/2019 1336   BUN 17.0 11/10/2015 1129   CREATININE 1.00 01/01/2019 1336   CREATININE 0.8 11/10/2015 1129   CALCIUM 9.5 01/01/2019 1336   CALCIUM 9.2 11/10/2015 1129   GFRNONAA 54 (L) 01/01/2019 1336   GFRAA 63 01/01/2019 1336   Lab Results  Component Value Date   HGBA1C 6.0 10/09/2018   HGBA1C 5.8 04/25/2017   HGBA1C 5.7 08/24/2015   Lab Results  Component Value Date   INSULIN 15.9 01/01/2019   CBC    Component Value Date/Time   WBC 4.4 10/09/2018 1124   RBC 3.78 (L) 10/09/2018 1124   HGB 11.9 (L) 10/09/2018 1124   HGB 10.0 (L) 11/10/2015 1128   HGB 12.3 12/24/2014 1514   HCT 35.5 (L) 10/09/2018 1124   HCT 31.8 (L) 11/10/2015 1128   HCT 38.0 12/24/2014 1514   PLT 195.0 10/09/2018 1124   PLT 191 11/10/2015 1128   PLT 176 12/24/2014 1514   MCV 94.0 10/09/2018 1124   MCV 93 11/10/2015 1128   MCV 92.2 12/24/2014 1514   MCH 29.2 11/10/2015 1128   MCH 29.3 10/14/2015 1543  MCHC 33.4 10/09/2018 1124   RDW 13.6 10/09/2018 1124   RDW 14.5 11/10/2015 1128   RDW 13.7 12/24/2014 1514   LYMPHSABS 1.1 10/09/2018 1124   LYMPHSABS 0.9 11/10/2015 1128   LYMPHSABS 1.1 12/24/2014 1514   MONOABS 0.3 10/09/2018 1124   MONOABS 0.3 12/24/2014 1514    EOSABS 0.1 10/09/2018 1124   EOSABS 0.2 11/10/2015 1128   BASOSABS 0.0 10/09/2018 1124   BASOSABS 0.0 11/10/2015 1128   BASOSABS 0.0 12/24/2014 1514   Iron/TIBC/Ferritin/ %Sat    Component Value Date/Time   IRON 30 (L) 11/10/2015 1128   TIBC 265 11/10/2015 1128   FERRITIN 154 11/10/2015 1128   IRONPCTSAT 11 (L) 11/10/2015 1128   Lipid Panel     Component Value Date/Time   CHOL 174 10/09/2018 1124   TRIG 117.0 10/09/2018 1124   HDL 53.30 10/09/2018 1124   CHOLHDL 3 10/09/2018 1124   VLDL 23.4 10/09/2018 1124   LDLCALC 98 10/09/2018 1124   LDLDIRECT 177.2 08/04/2013 1147   Hepatic Function Panel     Component Value Date/Time   PROT 6.4 01/01/2019 1336   PROT 6.3 (L) 11/10/2015 1129   ALBUMIN 4.2 01/01/2019 1336   ALBUMIN 3.1 (L) 11/10/2015 1129   AST 16 01/01/2019 1336   AST 15 11/10/2015 1129   ALT 16 01/01/2019 1336   ALT 14 11/10/2015 1129   ALKPHOS 103 01/01/2019 1336   ALKPHOS 100 11/10/2015 1129   BILITOT 0.8 01/01/2019 1336   BILITOT 0.47 11/10/2015 1129   BILIDIR 0.2 10/15/2007 0000      Component Value Date/Time   TSH 3.11 10/09/2018 1124   TSH 3.11 03/25/2018 1435   TSH 3.07 09/25/2017 1615      OBESITY BEHAVIORAL INTERVENTION VISIT  Today's visit was # 3   Starting weight: 217 lbs Starting date: 01/01/2019 Today's weight : 207 lbs Today's date: 02/03/2019 Total lbs lost to date: 10 At least 15 minutes were spent on discussing the following behavioral intervention visit.    02/03/2019  Height 5\' 3"  (1.6 m)  Weight 207 lb (93.9 kg)  BMI (Calculated) 36.68  BLOOD PRESSURE - SYSTOLIC 329  BLOOD PRESSURE - DIASTOLIC 77   Body Fat % 51.8 %  Total Body Water (lbs) 73.4 lbs    ASK: We discussed the diagnosis of obesity with Lori Joseph today and Lori Joseph agreed to give [name] permission to discuss obesity behavioral modification therapy today.  ASSESS: Lori Joseph has the diagnosis of obesity and her BMI today is 36.68 Lori Joseph is in the action stage of  change   ADVISE: Danice was educated on the multiple health risks of obesity as well as the benefit of weight loss to improve her health. She was advised of the need for long term treatment and the importance of lifestyle modifications to improve her current health and to decrease her risk of future health problems.  AGREE: Multiple dietary modification options and treatment options were discussed and  Lori Joseph agreed to follow the recommendations documented in the above note.  ARRANGE: Lori Joseph was educated on the importance of frequent visits to treat obesity as outlined per CMS and USPSTF guidelines and agreed to schedule her next follow up appointment today.  I, Trixie Dredge, am acting as transcriptionist for Ilene Qua, MD  I have reviewed the above documentation for accuracy and completeness, and I agree with the above. - Ilene Qua, MD

## 2019-02-04 MED ORDER — POLYETHYLENE GLYCOL 3350 17 GM/SCOOP PO POWD: 17.0000 g | Freq: Every day | ORAL | 0 refills | Status: DC

## 2019-02-05 ENCOUNTER — Other Ambulatory Visit (INDEPENDENT_AMBULATORY_CARE_PROVIDER_SITE_OTHER): Payer: Self-pay | Admitting: Family Medicine

## 2019-02-05 DIAGNOSIS — E559 Vitamin D deficiency, unspecified: Secondary | ICD-10-CM

## 2019-02-09 ENCOUNTER — Other Ambulatory Visit (INDEPENDENT_AMBULATORY_CARE_PROVIDER_SITE_OTHER): Payer: Self-pay | Admitting: Family Medicine

## 2019-02-09 DIAGNOSIS — E559 Vitamin D deficiency, unspecified: Secondary | ICD-10-CM

## 2019-02-16 ENCOUNTER — Telehealth: Payer: Self-pay | Admitting: Cardiology

## 2019-02-16 ENCOUNTER — Encounter: Payer: Self-pay | Admitting: Internal Medicine

## 2019-02-16 ENCOUNTER — Ambulatory Visit (INDEPENDENT_AMBULATORY_CARE_PROVIDER_SITE_OTHER): Payer: Medicare Other | Admitting: Internal Medicine

## 2019-02-16 ENCOUNTER — Other Ambulatory Visit: Payer: Self-pay

## 2019-02-16 ENCOUNTER — Ambulatory Visit: Payer: Self-pay

## 2019-02-16 VITALS — BP 134/78 | HR 143 | Temp 98.0°F | Resp 16 | Ht 63.0 in | Wt 208.5 lb

## 2019-02-16 DIAGNOSIS — I4891 Unspecified atrial fibrillation: Secondary | ICD-10-CM

## 2019-02-16 DIAGNOSIS — E890 Postprocedural hypothyroidism: Secondary | ICD-10-CM

## 2019-02-16 MED ORDER — METOPROLOL TARTRATE 50 MG PO TABS: 50.0000 mg | ORAL_TABLET | Freq: Two times a day (BID) | ORAL | 1 refills | Status: DC

## 2019-02-16 NOTE — Assessment & Plan Note (Signed)
Atrial fibrillation: Patient presents today with ill-defined fatigue but no chest pain, difficulty breathing, palpitations or lower extremity edema. She has a history of A. fib, typically on sinus rhythm but today EKG showed A. fib with a heart rate of 133.  That is most likely explanation for her symptoms. Plan: BMP, CBC, TSH. Continue Xarelto Decrease HCTZ to 12.5 mg daily, increase metoprolol to 50 mg twice a day. Case discussed with cardiology, they are in agreement, they will contact the patient for an appropriate follow-up. ER if symptoms different/severe.  See AVS. Hypothyroidism: Checking labs Weight loss: Not taking any appetite suppressants, encouraged to continue with her healthier diet RTC 6 weeks for a checkup

## 2019-02-16 NOTE — Progress Notes (Signed)
Subjective:    Patient ID: Lori Joseph, female    DOB: 1940/12/23, 78 y.o.   MRN: [id number]  DOS:  02/16/2019 Type of visit - description: acute She was feeling well until yesterday when she started to feel weak, tired, "about to faint". No actual LOC. She is taking her medications as prescribed.  She is losing weight due to better diet, is not taking any appetite suppressant or OTCs. Good compliance with Xarelto. She has also noted her BPs to be slightly elevated particularly the diastolic BP around 885.  Review of Systems Denies fever chills No chest pain no difficulty breathing. No lower extremity edema Question of palpitations yesterday, none today. No nausea or vomiting.  No diarrhea. No headache or dizziness. No stroke symptoms such as diplopia, slurred speech or motor deficits  Past Medical History:  Diagnosis Date  . Allergic rhinitis   . Anemia   . Atrial fibrillation ([place]) 03/2017  . Family history of anesthesia complication    daughter hard to wake up  . Glaucoma   . H/O acute pancreatitis   . H/O retinal detachment   . Hurthle cell neoplasm of thyroid    Right   . HYPERLIPIDEMIA   . Hypertension   . Hypothyroidism 05/31/2015  . OSTEOPENIA   . Personal history of radiation therapy 2015  . Primary cancer of upper outer quadrant of left female breast ([place])    Dr. Lindi Adie  . Radiation 11/11/15-12/30/14   left breast 50.4 gray, lumpectomy cavity boosted to 62.4 gray    Past Surgical History:  Procedure Laterality Date  . BREAST BIOPSY Left 08/27/14  . BREAST BIOPSY Left 11/27/2016  . BREAST LUMPECTOMY Left 09/15/2014  . CHOLECYSTECTOMY  2000  . DILATION AND CURETTAGE OF UTERUS    . laser eye surgery, detached retina Left   . RADIOACTIVE SEED GUIDED PARTIAL MASTECTOMY WITH AXILLARY SENTINEL LYMPH NODE BIOPSY Left 09/16/2014   Procedure: RADIOACTIVE SEED GUIDED PARTIAL MASTECTOMY WITH AXILLARY SENTINEL LYMPH NODE BIOPSY;  Surgeon: [name], MD;  Location:  [facility];  Service: General;  Laterality: Left;  . thyoidectomy Left    partial  . THYROID LOBECTOMY N/A 02/10/2015   Procedure: RIGHT THYROID LOBECTOMY;  Surgeon: [name], MD;  Location: [facility];  Service: General;  Laterality: N/A;  . TONSILLECTOMY    . TUBAL LIGATION      Social History   Socioeconomic History  . Marital status: Married    Spouse name: Ruthann Cancer  . Number of children: 2  . Years of education: Not on file  . Highest education level: Not on file  Occupational History  . Occupation: RETIRED-- Retail banker: [employer]  Social Needs  . Financial resource strain: Not on file  . Food insecurity:    Worry: Not on file    Inability: Not on file  . Transportation needs:    Medical: Not on file    Non-medical: Not on file  Tobacco Use  . Smoking status: Never Smoker  . Smokeless tobacco: Never Used  Substance and Sexual Activity  . Alcohol use: No    Alcohol/week: 0.0 standard drinks  . Drug use: No  . Sexual activity: Not on file  Lifestyle  . Physical activity:    Days per week: Not on file    Minutes per session: Not on file  . Stress: Not on file  Relationships  . Social connections:    Talks on phone: Not on file  Gets together: Not on file    Attends religious service: Not on file    Active member of club or organization: Not on file    Attends meetings of clubs or organizations: Not on file    Relationship status: Not on file  . Intimate partner violence:    Fear of current or ex partner: Not on file    Emotionally abused: Not on file    Physically abused: Not on file    Forced sexual activity: Not on file  Other Topics Concern  . Not on file  Social History Narrative   Lives w/ husband      Allergies as of 02/16/2019      Reactions   Codeine Anaphylaxis   Hyaluronic Acid  [collagen-chond-hyaluronic Acid] Itching, Rash   Carvedilol    REACTION: leg pain, edema   Felodipine    REACTION:  HA, insomnia   Lisinopril    REACTION: HA, SWELLING   Losartan Potassium    REACTION: cough   Radiaplexrx [pyridoxine-zinc Picolinate] Itching, Rash      Medication List       Accurate as of February 16, 2019  3:28 PM. Always use your most recent med list.        anastrozole 1 MG tablet Commonly known as:  ARIMIDEX Take 1 tablet (1 mg total) by mouth daily.   calcium carbonate 1250 MG capsule Take 2,500 mg by mouth every morning.   cholecalciferol 1000 units tablet Commonly known as:  VITAMIN D Take 2,500 Units by mouth every morning.   dorzolamide-timolol 22.3-6.8 MG/ML ophthalmic solution Commonly known as:  COSOPT Place 1 drop into both eyes every morning.   hydrochlorothiazide 25 MG tablet Commonly known as:  HYDRODIURIL Take 0.5 tablets (12.5 mg total) by mouth daily.   latanoprost 0.005 % ophthalmic solution Commonly known as:  XALATAN PLACE 1 DROP INTO BOTH EYES NIGHTLY.   levothyroxine 25 MCG tablet Commonly known as:  SYNTHROID, LEVOTHROID Take 1 tablet (25 mcg total) by mouth daily before breakfast.   metoprolol tartrate 50 MG tablet Commonly known as:  LOPRESSOR Take 1 tablet (50 mg total) by mouth 2 (two) times daily.   multivitamin tablet Take 1 tablet by mouth every morning.   polyethylene glycol powder powder Commonly known as:  GLYCOLAX/MIRALAX Take 17 g by mouth daily.   pravastatin 40 MG tablet Commonly known as:  PRAVACHOL TAKE 1 TABLET (40 MG TOTAL) BY MOUTH DAILY.   rivaroxaban 20 MG Tabs tablet Commonly known as:  Xarelto Take 1 tablet (20 mg total) by mouth daily with supper.   telmisartan 40 MG tablet Commonly known as:  MICARDIS Take 1 tablet (40 mg total) by mouth daily.   Vitamin D (Ergocalciferol) 1.25 MG (50000 UT) Caps capsule Commonly known as:  DRISDOL Take 1 capsule (50,000 Units total) by mouth every 7 (seven) days.           Objective:   Physical Exam BP 134/78 (BP Location: Right Arm, Patient Position:  Sitting, Cuff Size: Normal)   Pulse (!) 143   Temp 98 F (36.7 C) (Oral)   Resp 16   Ht 5\' 3"  (1.6 m)   Wt 208 lb 8 oz (94.6 kg)   LMP  (LMP Unknown)   SpO2 98%   BMI 36.93 kg/m  General:   Well developed, NAD, BMI noted. HEENT:  Normocephalic . Face symmetric, atraumatic Lungs:  CTA B Normal respiratory effort, no intercostal retractions, no accessory muscle use. Heart: Irregularly irregular,  no murmur.  No pretibial edema bilaterally  Skin: Not pale. Not jaundice Neurologic:  alert & oriented X3.  Speech normal, gait appropriate for age and unassisted Psych--  Cognition and judgment appear intact.  Cooperative with normal attention span and concentration.  Behavior appropriate. No anxious or depressed appearing.      Assessment    Assessment  HTN Hyperlipidemia Hypothyroidism Osteopenia: T score 2010 normal,  T score 05-2012 (-1.2), on calcium and vitamin D Morbid Obesity CV: -A. fib, new onset 03-2017 -Low risk stress test 04-2017 Oncology: --Breast cancer, left, dx  2015, XRT 10/2015 to 12/2014, on Arimidex --Hurthle Cell neoplasm of the thyroid, thyroidectomy 01-2015 SKIN: ---Eczema ---[place] 2012, Dr Allyson Sabal ANEMIA- saw hematology, rx IV iron 10-2015 Chronic right-sided chest pain:  x-rays CT abdomen and pelvis 2016 negative, saw pain mngmt, ortho ---> better with Lidoderm patch and a local injection in the back Glaucoma   H/o acute pancreatitis H/o retinal  detachment Pulmonary: Abnormal chest x-ray and CT chest: Last CT chest 01-2016: See report. CXR 03/27/2016: No acute. Pulmonary: f/u prn ++ FH Lung Ca (pt is not a smoker)  PLAN: Atrial fibrillation: Patient presents today with ill-defined fatigue but no chest pain, difficulty breathing, palpitations or lower extremity edema. She has a history of A. fib, typically on sinus rhythm but today EKG showed A. fib with a heart rate of 133.  That is most likely explanation for her symptoms. Plan: BMP, CBC, TSH.  Continue Xarelto Decrease HCTZ to 12.5 mg daily, increase metoprolol to 50 mg twice a day. Case discussed with cardiology, they are in agreement, they will contact the patient for an appropriate follow-up. ER if symptoms different/severe.  See AVS. Hypothyroidism: Checking labs Weight loss: Not taking any appetite suppressants, encouraged to continue with her healthier diet RTC 6 weeks for a checkup

## 2019-02-16 NOTE — Telephone Encounter (Signed)
   Discussed case with Dr. Larose Kells.  Atrial fibrillation with heart rate in the 120-130 range.  She was feeling increased fatigue.  No chest pain.  No significant shortness of breath.  Currently on Xarelto.  I agree with plan of increasing her metoprolol from 25 twice a day to 50 twice a day and pulling back on her HCTZ 12.5 a day.  We will try to contact her for possible virtual visit if possible next week to check on her.  Candee Furbish, MD

## 2019-02-16 NOTE — Progress Notes (Signed)
Pre visit review using our clinic review tool, if applicable. No additional management support is needed unless otherwise documented below in the visit note. 

## 2019-02-16 NOTE — Patient Instructions (Addendum)
GO TO THE LAB : Get the blood work     GO TO THE FRONT DESK Schedule your next appointment for checkup in 6 weeks  Decrease hydrochlorothiazide to half tablet daily  Increase metoprolol to 50 mg twice a day  Check the  blood pressure twice a day Be sure your blood pressure is between 110/65 and  135/85. If it is consistently higher or lower, let me know   If you have chest pain, difficulty breathing, leg swelling: Call or go to the ER  Cardiology will contact you

## 2019-02-16 NOTE — Telephone Encounter (Signed)
Pt c/o mild lightheadedness that started yesterday. Pt with a h/o atrial fibrillation and this morning her BP128/107 then after she took her BP meds her BP 122/78. HR 90 then was rechecked an was 78. Pt stated that the last time she had these symptoms she was diagnosed with atrial fib. Pt given care advice and office appt made with Dr Larose Kells today. Pt verbalized understanding  Reason for Disposition . [1] MILD dizziness (e.g., walking normally) AND [2] has NOT been evaluated by physician for this  (Exception: dizziness caused by heat exposure, sudden standing, or poor fluid intake)  Answer Assessment - Initial Assessment Questions 1. DESCRIPTION: "Describe your dizziness."     lightheadedness 2. LIGHTHEADED: "Do you feel lightheaded?" (e.g., somewhat faint, woozy, weak upon standing)     yes 3. VERTIGO: "Do you feel like either you or the room is spinning or tilting?" (i.e. vertigo)     no 4. SEVERITY: "How bad is it?"  "Do you feel like you are going to faint?" "Can you stand and walk?"   - MILD - walking normally   - MODERATE - interferes with normal activities (e.g., work, school)    - SEVERE - unable to stand, requires support to walk, feels like passing out now.      mild 5. ONSET:  "When did the dizziness begin?"     yesterday 6. AGGRAVATING FACTORS: "Does anything make it worse?" (e.g., standing, change in head position)     Getting up and standing washing dishes 7. HEART RATE: "Can you tell me your heart rate?" "How many beats in 15 seconds?"  (Note: not all patients can do this)       90 124/96 8. CAUSE: "What do you think is causing the dizziness?"  Thinks related to to weight loss- thinks its her heart 9. RECURRENT SYMPTOM: "Have you had dizziness before?" If so, ask: "When was the last time?" "What happened that time?"     Yes- went to dr and had a fib 10. OTHER SYMPTOMS: "Do you have any other symptoms?" (e.g., fever, chest pain, vomiting, diarrhea, bleeding)       no 11.  PREGNANCY: "Is there any chance you are pregnant?" "When was your last menstrual period?"       n/a  Protocols used: [facility]

## 2019-02-17 LAB — CBC WITH DIFFERENTIAL/PLATELET
Basophils Absolute: 0.1 10*3/uL (ref 0.0–0.1)
Basophils Relative: 1.2 % (ref 0.0–3.0)
EOS PCT: 1.4 % (ref 0.0–5.0)
Eosinophils Absolute: 0.1 10*3/uL (ref 0.0–0.7)
HCT: 40.3 % (ref 36.0–46.0)
Hemoglobin: 13.2 g/dL (ref 12.0–15.0)
Lymphocytes Relative: 30.7 % (ref 12.0–46.0)
Lymphs Abs: 1.7 10*3/uL (ref 0.7–4.0)
MCHC: 32.8 g/dL (ref 30.0–36.0)
MCV: 95.2 fl (ref 78.0–100.0)
Monocytes Absolute: 0.4 10*3/uL (ref 0.1–1.0)
Monocytes Relative: 6.9 % (ref 3.0–12.0)
Neutro Abs: 3.4 10*3/uL (ref 1.4–7.7)
Neutrophils Relative %: 59.8 % (ref 43.0–77.0)
Platelets: 186 10*3/uL (ref 150.0–400.0)
RBC: 4.24 Mil/uL (ref 3.87–5.11)
RDW: 13.5 % (ref 11.5–15.5)
WBC: 5.7 10*3/uL (ref 4.0–10.5)

## 2019-02-17 LAB — BASIC METABOLIC PANEL
BUN: 36 mg/dL — ABNORMAL HIGH (ref 6–23)
CO2: 25 mEq/L (ref 19–32)
Calcium: 9.7 mg/dL (ref 8.4–10.5)
Chloride: 104 mEq/L (ref 96–112)
Creatinine, Ser: 1.1 mg/dL (ref 0.40–1.20)
GFR: 48.06 mL/min — ABNORMAL LOW (ref >60.00)
GLUCOSE: 120 mg/dL — AB (ref 70–99)
Potassium: 4.3 mEq/L (ref 3.5–5.1)
Sodium: 141 mEq/L (ref 135–145)

## 2019-02-17 LAB — TSH: TSH: 3.54 u[IU]/mL (ref 0.35–4.50)

## 2019-02-18 ENCOUNTER — Encounter (INDEPENDENT_AMBULATORY_CARE_PROVIDER_SITE_OTHER): Payer: Self-pay

## 2019-02-20 ENCOUNTER — Encounter: Payer: Self-pay | Admitting: Cardiovascular Disease

## 2019-02-20 NOTE — Telephone Encounter (Signed)
In the setting of the current Covid19 crisis, you are scheduled for a (phone or video) visit with your provider on (date) at (time).  Just as we do with many in-office visits, in order for you to participate in this visit, we must obtain consent.  If you'd like, I can send this to your mychart (if signed up) or email for you to review.  Otherwise, I can obtain your verbal consent now.  All virtual visits are billed to your insurance company just like a normal visit would be.  By agreeing to a virtual visit, we'd like you to understand that the technology does not allow for your provider to perform an examination, and thus may limit your provider's ability to fully assess your condition.  Finally, though the technology is pretty good, we cannot assure that it will always work on either your or our end, and in the setting of a video visit, we may have to convert it to a phone-only visit.  In either situation, we cannot ensure that we have a secure connection.  Are you willing to proceed?  TELEPHONE CALL NOTE  Lori Joseph has been deemed a candidate for a follow-up tele-health visit to limit community exposure during the Covid-19 pandemic. I spoke with the patient via phone to ensure availability of phone/video source, confirm preferred email & phone number, and discuss instructions and expectations.  I reminded Lori Joseph to be prepared with any vital sign and/or heart rhythm information that could potentially be obtained via home monitoring, at the time of her visit. I reminded Lori Joseph to expect an e-mail containing a link for their video-based visit approximately 15 minutes before her visit, or alternatively, a phone call at the time of her visit if her visit is planned to be a phone encounter.  STAFF MUST READ CONSENT VERBATIM TO PATIENT BELOW - Did the patient verbally consent to treatment as below? yes  Lori Barter, RN 02/20/2019 10:09 AM  DOWNLOADING THE SOFTWARE (If applicable)   Download the [employer] app to enable video and telephone visits with your [facility] Provider.   Instructions for downloading [employer]: - Go to https://www.webex.com/downloads.html and follow the instructions - If you have technical difficulties with downloading WebEx, please call WebEx at [phone]. - Once the app is downloaded (can be done on either mobile or desktop computer), go to Settings in the upper left hand corner.  Be sure that camera and audio are enabled.  - You will receive an email message with a link to the meeting with a time to join for your tele-health visit.  - Please download the app and have settings configured prior to the appointment time.    CONSENT FOR TELE-HEALTH VISIT - PLEASE REVIEW  I hereby voluntarily request, consent and authorize [facility] and its employed or contracted physicians, physician assistants, nurse practitioners or other licensed health care professionals (the Practitioner), to provide me with telemedicine health care services (the "Services") as deemed necessary by the treating Practitioner. I acknowledge and consent to receive the Services by the Practitioner via telemedicine. I understand that the telemedicine visit will involve communicating with the Practitioner through live audiovisual communication technology and the disclosure of certain medical information by electronic transmission. I acknowledge that I have been given the opportunity to request an in-person assessment or other available alternative prior to the telemedicine visit and am voluntarily participating in the telemedicine visit.  I understand that I have the right to  withhold or withdraw my consent to the use of telemedicine in the course of my care at any time, without affecting my right to future care or treatment, and that the Practitioner or I may terminate the telemedicine visit at any time. I understand that I have the right to inspect all information obtained  and/or recorded in the course of the telemedicine visit and may receive copies of available information for a reasonable fee.  I understand that some of the potential risks of receiving the Services via telemedicine include:  Marland Kitchen Delay or interruption in medical evaluation due to technological equipment failure or disruption; . Information transmitted may not be sufficient (e.g. poor resolution of images) to allow for appropriate medical decision making by the Practitioner; and/or  . In rare instances, security protocols could fail, causing a breach of personal health information.  Furthermore, I acknowledge that it is my responsibility to provide information about my medical history, conditions and care that is complete and accurate to the best of my ability. I acknowledge that Practitioner's advice, recommendations, and/or decision may be based on factors not within their control, such as incomplete or inaccurate data provided by me or distortions of diagnostic images or specimens that may result from electronic transmissions. I understand that the practice of medicine is not an exact science and that Practitioner makes no warranties or guarantees regarding treatment outcomes. I acknowledge that I will receive a copy of this consent concurrently upon execution via email to the email address I last provided but may also request a printed copy by calling the office of [place].    I understand that my insurance will be billed for this visit.   I have read or had this consent read to me. . I understand the contents of this consent, which adequately explains the benefits and risks of the Services being provided via telemedicine.  . I have been provided ample opportunity to ask questions regarding this consent and the Services and have had my questions answered to my satisfaction. . I give my informed consent for the services to be provided through the use of telemedicine in my medical care  By  participating in this telemedicine visit I agree to the above.  Patient's email is [email or web address]

## 2019-02-23 ENCOUNTER — Telehealth (INDEPENDENT_AMBULATORY_CARE_PROVIDER_SITE_OTHER): Payer: Medicare Other | Admitting: Cardiovascular Disease

## 2019-02-23 ENCOUNTER — Encounter: Payer: Self-pay | Admitting: Cardiovascular Disease

## 2019-02-23 VITALS — BP 136/61 | HR 51 | Ht 63.0 in | Wt 208.0 lb

## 2019-02-23 DIAGNOSIS — I480 Paroxysmal atrial fibrillation: Secondary | ICD-10-CM

## 2019-02-23 NOTE — Progress Notes (Signed)
Virtual Visit via Video Note    Evaluation Performed:  Follow-up visit  This visit type was conducted due to national recommendations for restrictions regarding the COVID-19 Pandemic (e.g. social distancing).  This format is felt to be most appropriate for this patient at this time.  All issues noted in this document were discussed and addressed.  No physical exam was performed (except for noted visual exam findings with Video Visits).  Please refer to the patient's chart (MyChart message for video visits and phone note for telephone visits) for the patient's consent to telehealth for [facility].  Date:  02/23/2019   ID:  Lori Joseph, DOB [birth year], MRN [id number]  Patient Location:  Home  Provider location:   Office   PCP:  Colon Branch, MD  Cardiologist:  Jenkins Rouge, MD   Electrophysiologist:  None   Chief Complaint:  PAF   History of Present Illness:    Lori Joseph is a 78 y.o. female who presents via audio/video conferencing for a telehealth visit today.  For PAF   The patient does not symptoms concerning for COVID-19 infection (fever, chills, cough, or new shortness of breath).   First seen in June 2018 for new onset atrial fibrillation.She was started on metoprolol and xaretlto  Converted spontaneously   June 2018  Echo ordered and reviewed EF 55-60% mild LAE trivial MR Myovue normal EF 56% no ischemia   She has hypothyroidism TSH normal 04/25/17 2.95 CRF;s include HTN and elevated lipids.    This patients CHA2DS2-VASc Score and unadjusted Ischemic Stroke Rate (% per year) is equal to 4.8 % stroke rate/year from a score of 4  Doing well no palpitations compliant with meds Discussed phone apps and iwatch apps for PAF  Had left shoulder pain and had subarcromial steroid injection 11/25/18  Younger sister just diagnosed with brain tumor She has passed  Last 2023-03-07 / Mar 08, 2023 had palpitations and pre syncope seen by PAZ And noted to be in afib rate 133  Lopressor increased and diuretic decreased She has likely been going in/out of PAF as pulse now is in 50's   Discussed options including event monitor and adding AAT if frequent PAF  Prior CV studies:   The following studies were reviewed today:  Echo , ECG and labs   Past Medical History:  Diagnosis Date  . Allergic rhinitis   . Anemia   . Atrial fibrillation ([place]) 03/2017  . Family history of anesthesia complication    daughter hard to wake up  . Glaucoma   . H/O acute pancreatitis   . H/O retinal detachment   . Hurthle cell neoplasm of thyroid    Right   . HYPERLIPIDEMIA   . Hypertension   . Hypothyroidism 05/31/2015  . OSTEOPENIA   . Personal history of radiation therapy 2015  . Primary cancer of upper outer quadrant of left female breast ([place])    Dr. Lindi Adie  . Radiation 11/11/15-12/30/14   left breast 50.4 gray, lumpectomy cavity boosted to 62.4 gray   Past Surgical History:  Procedure Laterality Date  . BREAST BIOPSY Left 08/27/14  . BREAST BIOPSY Left 11/27/2016  . BREAST LUMPECTOMY Left 09/15/2014  . CHOLECYSTECTOMY  2000  . DILATION AND CURETTAGE OF UTERUS    . laser eye surgery, detached retina Left   . RADIOACTIVE SEED GUIDED PARTIAL MASTECTOMY WITH AXILLARY SENTINEL LYMPH NODE BIOPSY Left 09/16/2014   Procedure: RADIOACTIVE SEED GUIDED PARTIAL MASTECTOMY WITH AXILLARY SENTINEL LYMPH NODE BIOPSY;  Surgeon:  [name], MD;  Location: [facility];  Service: General;  Laterality: Left;  . thyoidectomy Left    partial  . THYROID LOBECTOMY N/A 02/10/2015   Procedure: RIGHT THYROID LOBECTOMY;  Surgeon: [name], MD;  Location: [facility];  Service: General;  Laterality: N/A;  . TONSILLECTOMY    . TUBAL LIGATION       Current Meds  Medication Sig  . anastrozole (ARIMIDEX) 1 MG tablet Take 1 tablet (1 mg total) by mouth daily.  . calcium carbonate 1250 MG capsule Take 2,500 mg by mouth every morning.   . cholecalciferol (VITAMIN D) 1000 UNITS tablet  Take 2,500 Units by mouth every morning.   . dorzolamide-timolol (COSOPT) 22.3-6.8 MG/ML ophthalmic solution Place 1 drop into both eyes every morning.   . hydrochlorothiazide (HYDRODIURIL) 25 MG tablet Take 0.5 tablets (12.5 mg total) by mouth daily.  Marland Kitchen latanoprost (XALATAN) 0.005 % ophthalmic solution PLACE 1 DROP INTO BOTH EYES NIGHTLY.  Marland Kitchen levothyroxine (SYNTHROID, LEVOTHROID) 25 MCG tablet Take 1 tablet (25 mcg total) by mouth daily before breakfast.  . metoprolol tartrate (LOPRESSOR) 50 MG tablet Take 1 tablet (50 mg total) by mouth 2 (two) times daily.  . Multiple Vitamin (MULTIVITAMIN) tablet Take 1 tablet by mouth every morning.   . pravastatin (PRAVACHOL) 40 MG tablet TAKE 1 TABLET (40 MG TOTAL) BY MOUTH DAILY.  . rivaroxaban (XARELTO) 20 MG TABS tablet Take 1 tablet (20 mg total) by mouth daily with supper.  . telmisartan (MICARDIS) 40 MG tablet Take 1 tablet (40 mg total) by mouth daily.  . Vitamin D, Ergocalciferol, (DRISDOL) 1.25 MG (50000 UT) CAPS capsule Take 1 capsule (50,000 Units total) by mouth every 7 (seven) days.     Allergies:   Codeine; Hyaluronic acid  [collagen-chond-hyaluronic acid]; Carvedilol; Felodipine; Lisinopril; Losartan potassium; and Radiaplexrx [pyridoxine-zinc picolinate]   Social History   Tobacco Use  . Smoking status: Never Smoker  . Smokeless tobacco: Never Used  Substance Use Topics  . Alcohol use: No    Alcohol/week: 0.0 standard drinks  . Drug use: No     Family Hx: The patient's family history includes Breast cancer (age of onset: 6) in her sister; Cancer in her brother, father, and paternal uncle; Kidney cancer (age of onset: 46) in her sister; Melanoma (age of onset: 67) in her sister; Ovarian cancer (age of onset: 84) in an other family member. There is no history of Colon cancer, CAD, Esophageal cancer, Stomach cancer, or Rectal cancer.  ROS:   Please see the history of present illness.     All other systems reviewed and are negative.    Labs/Other Tests and Data Reviewed:    Recent Labs: 01/01/2019: ALT 16 02/16/2019: BUN 36; Creatinine, Ser 1.10; Hemoglobin 13.2; Platelets 186.0; Potassium 4.3; Sodium 141; TSH 3.54   Recent Lipid Panel Lab Results  Component Value Date/Time   CHOL 174 10/09/2018 11:24 AM   TRIG 117.0 10/09/2018 11:24 AM   HDL 53.30 10/09/2018 11:24 AM   CHOLHDL 3 10/09/2018 11:24 AM   LDLCALC 98 10/09/2018 11:24 AM   LDLDIRECT 177.2 08/04/2013 11:47 AM    Wt Readings from Last 3 Encounters:  02/23/19 94.3 kg  02/16/19 94.6 kg  02/03/19 93.9 kg     Exam:    Vital Signs:  BP 136/61   Pulse (!) 51   Ht 5\' 3"  (1.6 m)   Wt 94.3 kg   LMP  (LMP Unknown)   BMI 36.85 kg/m  No distress Pulse in 50's BP per patient 123/68 No labored breathing  JVP looks normal  No edema Skin warm and dry  ASSESSMENT & PLAN:    1.  PAF:  Event monitor to be mailed to patient Continue xarelto and higher dose lopressor. If frequent PAF start flecainide 50 bid 2. Thyroid on replacement TSH normal 02/16/19 3. Anticoagulation: no bleeding issues Hct stable 35.5 normal renal function 4. Ortho:  Continued left shoulder pain she is not interested in another injection   COVID-19 Education: The signs and symptoms of COVID-19 were discussed with the patient and how to seek care for testing (follow up with PCP or arrange E-visit).  The importance of social distancing was discussed today.  Patient Risk:   After full review of this patients clinical status, I feel that they are at least moderate risk at this time.  Time:   Today, I have spent 30 minutes with the patient with telehealth technology discussing PAF.     Medication Adjustments/Labs and Tests Ordered: Current medicines are reviewed at length with the patient today.  Concerns regarding medicines are outlined above.  Tests Ordered: No orders of the defined types were placed in this encounter.  Medication Changes: No orders of the defined types were  placed in this encounter.   Disposition:  Follow up 30 days video conference  Event monitor to be mailed to patient   Signed, Jenkins Rouge, MD  02/23/2019 10:49 AM    [place]

## 2019-02-23 NOTE — Patient Instructions (Addendum)
Medication Instructions:   If you need a refill on your cardiac medications before your next appointment, please call your pharmacy.   Lab work:  If you have labs (blood work) drawn today and your tests are completely normal, you will receive your results only by: Marland Kitchen MyChart Message (if you have MyChart) OR . A paper copy in the mail If you have any lab test that is abnormal or we need to change your treatment, we will call you to review the results.  Testing/Procedures: Your physician has recommended that you wear an event monitor. Event monitors are medical devices that record the heart's electrical activity. Doctors most often [name] these monitors to diagnose arrhythmias. Arrhythmias are problems with the speed or rhythm of the heartbeat. The monitor is a small, portable device. You can wear one while you do your normal daily activities. This is usually used to diagnose what is causing palpitations/syncope (passing out).  Follow-Up: At [facility], you and your health needs are our priority.  As part of our continuing mission to provide you with exceptional heart care, we have created designated Provider Care Teams.  These Care Teams include your primary Cardiologist (physician) and Advanced Practice Providers (APPs -  Physician Assistants and Nurse Practitioners) who all work together to provide you with the care you need, when you need it. You will need a follow up appointment in 6 weeks for virtual visit.  You may see Jenkins Rouge, MD or one of the following Advanced Practice Providers on your designated Care Team:   Truitt Merle, NP Cecilie Kicks, NP . Kathyrn Drown, NP

## 2019-02-24 ENCOUNTER — Other Ambulatory Visit: Payer: Self-pay

## 2019-02-24 ENCOUNTER — Telehealth: Payer: Self-pay | Admitting: Radiology

## 2019-02-24 ENCOUNTER — Ambulatory Visit (INDEPENDENT_AMBULATORY_CARE_PROVIDER_SITE_OTHER): Payer: Medicare Other | Admitting: Family Medicine

## 2019-02-24 ENCOUNTER — Encounter (INDEPENDENT_AMBULATORY_CARE_PROVIDER_SITE_OTHER): Payer: Self-pay | Admitting: Family Medicine

## 2019-02-24 DIAGNOSIS — R7303 Prediabetes: Secondary | ICD-10-CM

## 2019-02-24 DIAGNOSIS — E038 Other specified hypothyroidism: Secondary | ICD-10-CM

## 2019-02-24 DIAGNOSIS — Z6836 Body mass index (BMI) 36.0-36.9, adult: Secondary | ICD-10-CM

## 2019-02-24 DIAGNOSIS — E559 Vitamin D deficiency, unspecified: Secondary | ICD-10-CM

## 2019-02-24 MED ORDER — VITAMIN D (ERGOCALCIFEROL) 1.25 MG (50000 UNIT) PO CAPS: 50000.0000 [IU] | ORAL_CAPSULE | ORAL | 0 refills | Status: DC

## 2019-02-24 NOTE — Telephone Encounter (Signed)
Event monitor was enrolled to be mailed to patient on 3/31 due to Covid-19. Patient knows to expect a call from [employer] and she will receive the monitor in 5-7 days.

## 2019-02-25 NOTE — Progress Notes (Addendum)
Office: [phone]  /  Fax: [phone] TeleHealth Visit:  Lori Joseph has verbally consented to this TeleHealth visit today. The patient is located at home, the provider is located at the [employer] [facility] office. The participants in this visit include the listed provider and patient, hte patient's husband and provider's assistant. The visit was conducted today via telephone call on Facetime.  HPI:   Chief Complaint: OBESITY Lori Joseph is here to discuss her progress with her obesity treatment plan. She is on the Category 1 plan and is following her eating plan approximately 80 % of the time. She states she is exercising 0 minutes 0 times per week. Lori Joseph went into atrial fibrillation recently and had an E-visit with Dr. Johnsie Cancel. She saw Dr. Larose Kells on 02/16/2019 for a-Fib. She did have 1 indulgent eating of pistachio pudding. She id doing Marine scientist at dinner which is more than she'd eat regularly. She states her blood pressure at home is 126/61 and heart rate of 58. We were unable to weigh the patient today for this TeleHealth visit. She feels as if she has 1 lb since her last visit. She has lost 10-11 lbs since starting treatment with [name].  Vitamin D Deficiency Lori Joseph has a diagnosis of vitamin D deficiency. She is currently taking prescription Vit D. She notes fatigue and denies nausea, vomiting or muscle weakness.  Hypothyroidism Lori Joseph has a diagnosis of hypothyroidism. She is on levothyroxine. She denies hot or cold intolerance or palpitations, but does admit to ongoing fatigue.  Pre-Diabetes Lori Joseph has a diagnosis of pre-diabetes based on her elevated Hgb A1c and was informed this puts her at greater risk of developing diabetes. She is not on medications and notes carbohydrate cravings. She continues to work on diet and exercise to decrease risk of diabetes. She denies nausea or hypoglycemia.  ASSESSMENT AND PLAN:  Vitamin D deficiency - Plan: Vitamin D, Ergocalciferol, (DRISDOL)  1.25 MG (50000 UT) CAPS capsule  Other specified hypothyroidism  Prediabetes  Class 2 severe obesity with serious comorbidity and body mass index (BMI) of 36.0 to 36.9 in adult, unspecified obesity type ([place])  PLAN:  Vitamin D Deficiency Lori Joseph was informed that low vitamin D levels contributes to fatigue and are associated with obesity, breast, and colon cancer. Lori Joseph agrees to continue taking prescription Vit D @50 ,000 IU every week #4 and we will refill for 1 month. She will follow up for routine testing of vitamin D, at least 2-3 times per year. She was informed of the risk of over-replacement of vitamin D and agrees to not increase her dose unless she discusses this with [name] first. Lori Joseph agrees to follow up with our clinic in 2 weeks.  Hypothyroidism Lori Joseph was informed of the importance of good thyroid control to help with weight loss efforts. She was also informed that supertheraputic thyroid levels are dangerous and will not improve weight loss results. Lori Joseph agrees to continue Synthroid at current dose, and she agrees to follow up with our clinic in 2 weeks.  Pre-Diabetes Lori Joseph will continue to work on weight loss, exercise, and decreasing simple carbohydrates in her diet to help decrease the risk of diabetes. We dicussed metformin including benefits and risks. She was informed that eating too many simple carbohydrates or too many calories at one sitting increases the likelihood of GI side effects. Lori Joseph declined metformin for now and a prescription was not written today. We will repeat labs in April. Lori Joseph agrees to follow up with our clinic in  2 weeks as directed to monitor her progress.  Obesity Lori Joseph is currently in the action stage of change. As such, her goal is to continue with weight loss efforts She has agreed to follow the Category 1 plan Lori Joseph has been instructed to work up to a goal of 150 minutes of combined cardio and strengthening exercise per week for weight loss and  overall health benefits. We discussed the following Behavioral Modification Strategies today: increasing lean protein intake, increasing vegetables and work on meal planning and easy cooking plans, better snacking choices, and planning for success Lori Joseph is to work on getting in more protein a day for the next 2 weeks.  Lori Joseph has agreed to follow up with our clinic in 2 weeks. She was informed of the importance of frequent follow up visits to maximize her success with intensive lifestyle modifications for her multiple health conditions.  ALLERGIES: Allergies  Allergen Reactions  . Codeine Anaphylaxis  . Hyaluronic Acid  [Collagen-Chond-Hyaluronic Acid] Itching and Rash  . Carvedilol     REACTION: leg pain, edema  . Felodipine     REACTION: HA, insomnia  . Lisinopril     REACTION: HA, SWELLING  . Losartan Potassium     REACTION: cough  . Radiaplexrx [Pyridoxine-Zinc Picolinate] Itching and Rash    MEDICATIONS: Current Outpatient Medications on File Prior to Visit  Medication Sig Dispense Refill  . anastrozole (ARIMIDEX) 1 MG tablet Take 1 tablet (1 mg total) by mouth daily. 90 tablet 3  . calcium carbonate 1250 MG capsule Take 2,500 mg by mouth every morning.     . cholecalciferol (VITAMIN D) 1000 UNITS tablet Take 2,500 Units by mouth every morning.     . dorzolamide-timolol (COSOPT) 22.3-6.8 MG/ML ophthalmic solution Place 1 drop into both eyes every morning.     . hydrochlorothiazide (HYDRODIURIL) 25 MG tablet Take 0.5 tablets (12.5 mg total) by mouth daily.    Marland Kitchen latanoprost (XALATAN) 0.005 % ophthalmic solution PLACE 1 DROP INTO BOTH EYES NIGHTLY.    Marland Kitchen levothyroxine (SYNTHROID, LEVOTHROID) 25 MCG tablet Take 1 tablet (25 mcg total) by mouth daily before breakfast. 90 tablet 1  . metoprolol tartrate (LOPRESSOR) 50 MG tablet Take 1 tablet (50 mg total) by mouth 2 (two) times daily. 180 tablet 1  . Multiple Vitamin (MULTIVITAMIN) tablet Take 1 tablet by mouth every morning.     .  pravastatin (PRAVACHOL) 40 MG tablet TAKE 1 TABLET (40 MG TOTAL) BY MOUTH DAILY. 90 tablet 1  . rivaroxaban (XARELTO) 20 MG TABS tablet Take 1 tablet (20 mg total) by mouth daily with supper. 90 tablet 1  . telmisartan (MICARDIS) 40 MG tablet Take 1 tablet (40 mg total) by mouth daily. 90 tablet 1   No current facility-administered medications on file prior to visit.     PAST MEDICAL HISTORY: Past Medical History:  Diagnosis Date  . Allergic rhinitis   . Anemia   . Atrial fibrillation ([place]) 03/2017  . Family history of anesthesia complication    daughter hard to wake up  . Glaucoma   . H/O acute pancreatitis   . H/O retinal detachment   . Hurthle cell neoplasm of thyroid    Right   . HYPERLIPIDEMIA   . Hypertension   . Hypothyroidism 05/31/2015  . OSTEOPENIA   . Personal history of radiation therapy 2015  . Primary cancer of upper outer quadrant of left female breast ([place])    Dr. Lindi Adie  . Radiation 11/11/15-12/30/14   left breast 50.4  gray, lumpectomy cavity boosted to 62.4 gray    PAST SURGICAL HISTORY: Past Surgical History:  Procedure Laterality Date  . BREAST BIOPSY Left 08/27/14  . BREAST BIOPSY Left 11/27/2016  . BREAST LUMPECTOMY Left 09/15/2014  . CHOLECYSTECTOMY  2000  . DILATION AND CURETTAGE OF UTERUS    . laser eye surgery, detached retina Left   . RADIOACTIVE SEED GUIDED PARTIAL MASTECTOMY WITH AXILLARY SENTINEL LYMPH NODE BIOPSY Left 09/16/2014   Procedure: RADIOACTIVE SEED GUIDED PARTIAL MASTECTOMY WITH AXILLARY SENTINEL LYMPH NODE BIOPSY;  Surgeon: [name], MD;  Location: [facility];  Service: General;  Laterality: Left;  . thyoidectomy Left    partial  . THYROID LOBECTOMY N/A 02/10/2015   Procedure: RIGHT THYROID LOBECTOMY;  Surgeon: [name], MD;  Location: [facility];  Service: General;  Laterality: N/A;  . TONSILLECTOMY    . TUBAL LIGATION      SOCIAL HISTORY: Social History   Tobacco Use  . Smoking status: Never Smoker  .  Smokeless tobacco: Never Used  Substance Use Topics  . Alcohol use: No    Alcohol/week: 0.0 standard drinks  . Drug use: No    FAMILY HISTORY: Family History  Problem Relation Age of Onset  . Breast cancer Sister 69  . Cancer Father        lung cancer ; smoker  . Cancer Brother        3 brothers with lung cancer, all smokers  . Cancer Paternal Uncle        2 pat uncles with lung cancer, smokers  . Kidney cancer Sister 29  . Melanoma Sister 80  . Ovarian cancer Other 85       niece with ovarian cancer (related through sister with breast cancer)  . Colon cancer Neg Hx   . CAD Neg Hx   . Esophageal cancer Neg Hx   . Stomach cancer Neg Hx   . Rectal cancer Neg Hx     ROS: Review of Systems  Constitutional: Positive for malaise/fatigue and weight loss.  Cardiovascular: Negative for palpitations.  Gastrointestinal: Negative for nausea and vomiting.  Musculoskeletal:       Negative muscle weakness  Endo/Heme/Allergies:       Negative hot/cold intolerance Negative hypoglycemia    PHYSICAL EXAM: Pt in no acute distress  RECENT LABS AND TESTS: BMET    Component Value Date/Time   NA 141 02/16/2019 1508   NA 143 01/01/2019 1336   NA 140 11/10/2015 1129   K 4.3 02/16/2019 1508   K 4.0 11/10/2015 1129   CL 104 02/16/2019 1508   CO2 25 02/16/2019 1508   CO2 23 11/10/2015 1129   GLUCOSE 120 (H) 02/16/2019 1508   GLUCOSE 98 11/10/2015 1129   BUN 36 (H) 02/16/2019 1508   BUN 29 (H) 01/01/2019 1336   BUN 17.0 11/10/2015 1129   CREATININE 1.10 02/16/2019 1508   CREATININE 0.8 11/10/2015 1129   CALCIUM 9.7 02/16/2019 1508   CALCIUM 9.2 11/10/2015 1129   GFRNONAA 54 (L) 01/01/2019 1336   GFRAA 63 01/01/2019 1336   Lab Results  Component Value Date   HGBA1C 6.0 10/09/2018   HGBA1C 5.8 04/25/2017   HGBA1C 5.7 08/24/2015   Lab Results  Component Value Date   INSULIN 15.9 01/01/2019   CBC    Component Value Date/Time   WBC 5.7 02/16/2019 1508   RBC 4.24  02/16/2019 1508   HGB 13.2 02/16/2019 1508   HGB 10.0 (L) 11/10/2015 1128  HGB 12.3 12/24/2014 1514   HCT 40.3 02/16/2019 1508   HCT 31.8 (L) 11/10/2015 1128   HCT 38.0 12/24/2014 1514   PLT 186.0 02/16/2019 1508   PLT 191 11/10/2015 1128   PLT 176 12/24/2014 1514   MCV 95.2 02/16/2019 1508   MCV 93 11/10/2015 1128   MCV 92.2 12/24/2014 1514   MCH 29.2 11/10/2015 1128   MCH 29.3 10/14/2015 1543   MCHC 32.8 02/16/2019 1508   RDW 13.5 02/16/2019 1508   RDW 14.5 11/10/2015 1128   RDW 13.7 12/24/2014 1514   LYMPHSABS 1.7 02/16/2019 1508   LYMPHSABS 0.9 11/10/2015 1128   LYMPHSABS 1.1 12/24/2014 1514   MONOABS 0.4 02/16/2019 1508   MONOABS 0.3 12/24/2014 1514   EOSABS 0.1 02/16/2019 1508   EOSABS 0.2 11/10/2015 1128   BASOSABS 0.1 02/16/2019 1508   BASOSABS 0.0 11/10/2015 1128   BASOSABS 0.0 12/24/2014 1514   Iron/TIBC/Ferritin/ %Sat    Component Value Date/Time   IRON 30 (L) 11/10/2015 1128   TIBC 265 11/10/2015 1128   FERRITIN 154 11/10/2015 1128   IRONPCTSAT 11 (L) 11/10/2015 1128   Lipid Panel     Component Value Date/Time   CHOL 174 10/09/2018 1124   TRIG 117.0 10/09/2018 1124   HDL 53.30 10/09/2018 1124   CHOLHDL 3 10/09/2018 1124   VLDL 23.4 10/09/2018 1124   LDLCALC 98 10/09/2018 1124   LDLDIRECT 177.2 08/04/2013 1147   Hepatic Function Panel     Component Value Date/Time   PROT 6.4 01/01/2019 1336   PROT 6.3 (L) 11/10/2015 1129   ALBUMIN 4.2 01/01/2019 1336   ALBUMIN 3.1 (L) 11/10/2015 1129   AST 16 01/01/2019 1336   AST 15 11/10/2015 1129   ALT 16 01/01/2019 1336   ALT 14 11/10/2015 1129   ALKPHOS 103 01/01/2019 1336   ALKPHOS 100 11/10/2015 1129   BILITOT 0.8 01/01/2019 1336   BILITOT 0.47 11/10/2015 1129   BILIDIR 0.2 10/15/2007 0000      Component Value Date/Time   TSH 3.54 02/16/2019 1508   TSH 3.11 10/09/2018 1124   TSH 3.11 03/25/2018 1435      I, Trixie Dredge, am acting as Location manager for Ilene Qua, MD  I have  reviewed the above documentation for accuracy and completeness, and I agree with the above. - Ilene Qua, MD

## 2019-02-27 ENCOUNTER — Encounter (INDEPENDENT_AMBULATORY_CARE_PROVIDER_SITE_OTHER): Payer: Medicare Other

## 2019-02-27 DIAGNOSIS — I480 Paroxysmal atrial fibrillation: Secondary | ICD-10-CM

## 2019-03-04 ENCOUNTER — Telehealth: Payer: Self-pay | Admitting: Cardiovascular Disease

## 2019-03-04 NOTE — Telephone Encounter (Signed)
New message     Pt c/o medication issue: 1. Name of Medication: metoprolol 2. How are you currently taking this medication (dosage and times per day)? 50mg  in am and 50mg  at night 3. Are you having a reaction (difficulty breathing--STAT)?  no 4. What is your medication issue? Called pt to schedule a 6wk virtual visit per Dr Johnsie Cancel.  Pt told me that the doctor made changes to her medication.  Her bp is 123/57 and her heart rate is 53.  Pt is concerned that the bottom number is too low. She is not having any headache or dizziness.  I told her a nurse would call her back.

## 2019-03-04 NOTE — Telephone Encounter (Signed)
Pt reports that her HR has been in the low 50's (53,54,57) for the past several days.   Pt has recently been instructed to increase her Metoprolol and is concerned if her HR is too low.  She is currently taking her Metoprolol 50mg  tablets twice daily.   Pt reports no dizzy spells unless she gets up too quickly.   Will route to APP for advisement.

## 2019-03-04 NOTE — Telephone Encounter (Signed)
Message received from triage.  I called and spoke with the patient.  Her blood pressure is 123/57.  Her calculated map is within normal limits.  She is asymptomatic with this pressure.  She thinks her A. fib is better controlled on this 50 mg of Lopressor twice daily.  I told her that her blood pressure was okay and since she was doing well without symptoms at this dose I would not like to change it at this time.  I asked her to call back if she started feeling symptoms or if her blood pressure went low.  We discussed parameters.  She is wearing the heart monitor.  She expressed understanding of the plan.    Tami Lin Duke, PA-C 03/04/2019, 3:54 PM

## 2019-03-10 ENCOUNTER — Encounter (INDEPENDENT_AMBULATORY_CARE_PROVIDER_SITE_OTHER): Payer: Self-pay | Admitting: Family Medicine

## 2019-03-10 ENCOUNTER — Other Ambulatory Visit: Payer: Self-pay

## 2019-03-10 ENCOUNTER — Ambulatory Visit (INDEPENDENT_AMBULATORY_CARE_PROVIDER_SITE_OTHER): Payer: Medicare Other | Admitting: Family Medicine

## 2019-03-10 DIAGNOSIS — R7303 Prediabetes: Secondary | ICD-10-CM

## 2019-03-10 DIAGNOSIS — I4891 Unspecified atrial fibrillation: Secondary | ICD-10-CM | POA: Diagnosis not present

## 2019-03-10 DIAGNOSIS — Z6836 Body mass index (BMI) 36.0-36.9, adult: Secondary | ICD-10-CM

## 2019-03-16 NOTE — Progress Notes (Signed)
Office: [phone]  /  Fax: [phone] TeleHealth Visit:  Lori Joseph has verbally consented to this TeleHealth visit today. The patient is located at home, the provider is located at the [employer] [facility] office. The participants in this visit include the listed provider and patient. The visit was conducted today via face time.  HPI:   Chief Complaint: OBESITY Lori Joseph is here to discuss her progress with her obesity treatment plan. She is on the Category 1 plan and is following her eating plan approximately 60 % of the time. She states she is exercising 0 minutes 0 times per week. Lori Joseph has been eating what she had in the freezer and cooking some comfort foods. Her weight this morning is 205. She is eating 2 eggs for breakfast, often skipping lunch, and is getting more protein than she was previously.  We were unable to weigh the patient today for this TeleHealth visit. She feels as if she has 3 lbs since her last visit. She has lost 10-13 lbs since starting treatment with [name].  Pre-Diabetes Lori Joseph has a diagnosis of pre-diabetes based on her elevated Hgb A1c and was informed this puts her at greater risk of developing diabetes. She notes carbohydrate cravings in source of starchy vegetables. She is not on medications and continues to work on diet and to decrease risk of diabetes. She denies nausea or hypoglycemia.  Atrial Fibrillation Lori Joseph is still taking her blood thinner and is currently rate controlled. She has an appointment with her doctor on May 11th with Dr. Marlou Porch.  ASSESSMENT AND PLAN:  Prediabetes  Atrial fibrillation, unspecified type (H[facility])  Class 2 severe obesity with serious comorbidity and body mass index (BMI) of 36.0 to 36.9 in adult, unspecified obesity type [facility])  PLAN:  Pre-Diabetes Lori Joseph will continue to work on weight loss, exercise, and decreasing simple carbohydrates in her diet to help decrease the risk of diabetes. We dicussed metformin including  benefits and risks. She was informed that eating too many simple carbohydrates or too many calories at one sitting increases the likelihood of GI side effects. Ger declined metformin for now and a prescription was not written today. We will repeat labs at the end of May. Lyliana agrees to follow up with our clinic in 2 weeks as directed to monitor her progress.   Atrial Fibrillation Lori Joseph is to keep previously scheduled Cardiology appointment. Lori Joseph agrees to follow up with our clinic in 2 weeks.  Obesity Lori Joseph is currently in the action stage of change. As such, her goal is to continue with weight loss efforts She has agreed to follow the Category 1 plan Lori Joseph has been instructed to work up to a goal of 150 minutes of combined cardio and strengthening exercise per week for weight loss and overall health benefits. We discussed the following Behavioral Modification Strategies today: increasing lean protein intake, decrease eating out, no skipping meals, better snacking choices, and planning for success   Lori Joseph has agreed to follow up with our clinic in 2 weeks. She was informed of the importance of frequent follow up visits to maximize her success with intensive lifestyle modifications for her multiple health conditions.  ALLERGIES: Allergies  Allergen Reactions  . Codeine Anaphylaxis  . Hyaluronic Acid  [Collagen-Chond-Hyaluronic Acid] Itching and Rash  . Carvedilol     REACTION: leg pain, edema  . Felodipine     REACTION: HA, insomnia  . Lisinopril     REACTION: HA, SWELLING  . Losartan Potassium  REACTION: cough  . Radiaplexrx [Pyridoxine-Zinc Picolinate] Itching and Rash    MEDICATIONS: Current Outpatient Medications on File Prior to Visit  Medication Sig Dispense Refill  . anastrozole (ARIMIDEX) 1 MG tablet Take 1 tablet (1 mg total) by mouth daily. 90 tablet 3  . calcium carbonate 1250 MG capsule Take 2,500 mg by mouth every morning.     . cholecalciferol (VITAMIN D) 1000  UNITS tablet Take 2,500 Units by mouth every morning.     . dorzolamide-timolol (COSOPT) 22.3-6.8 MG/ML ophthalmic solution Place 1 drop into both eyes every morning.     . hydrochlorothiazide (HYDRODIURIL) 25 MG tablet Take 0.5 tablets (12.5 mg total) by mouth daily.    Marland Kitchen latanoprost (XALATAN) 0.005 % ophthalmic solution PLACE 1 DROP INTO BOTH EYES NIGHTLY.    Marland Kitchen levothyroxine (SYNTHROID, LEVOTHROID) 25 MCG tablet Take 1 tablet (25 mcg total) by mouth daily before breakfast. 90 tablet 1  . metoprolol tartrate (LOPRESSOR) 50 MG tablet Take 1 tablet (50 mg total) by mouth 2 (two) times daily. 180 tablet 1  . Multiple Vitamin (MULTIVITAMIN) tablet Take 1 tablet by mouth every morning.     . pravastatin (PRAVACHOL) 40 MG tablet TAKE 1 TABLET (40 MG TOTAL) BY MOUTH DAILY. 90 tablet 1  . rivaroxaban (XARELTO) 20 MG TABS tablet Take 1 tablet (20 mg total) by mouth daily with supper. 90 tablet 1  . telmisartan (MICARDIS) 40 MG tablet Take 1 tablet (40 mg total) by mouth daily. 90 tablet 1  . Vitamin D, Ergocalciferol, (DRISDOL) 1.25 MG (50000 UT) CAPS capsule Take 1 capsule (50,000 Units total) by mouth every 7 (seven) days. 4 capsule 0   No current facility-administered medications on file prior to visit.     PAST MEDICAL HISTORY: Past Medical History:  Diagnosis Date  . Allergic rhinitis   . Anemia   . Atrial fibrillation ([place]) 03/2017  . Family history of anesthesia complication    daughter hard to wake up  . Glaucoma   . H/O acute pancreatitis   . H/O retinal detachment   . Hurthle cell neoplasm of thyroid    Right   . HYPERLIPIDEMIA   . Hypertension   . Hypothyroidism 05/31/2015  . OSTEOPENIA   . Personal history of radiation therapy 2015  . Primary cancer of upper outer quadrant of left female breast ([place])    Dr. Lindi Adie  . Radiation 11/11/15-12/30/14   left breast 50.4 gray, lumpectomy cavity boosted to 62.4 gray    PAST SURGICAL HISTORY: Past Surgical History:  Procedure  Laterality Date  . BREAST BIOPSY Left 08/27/14  . BREAST BIOPSY Left 11/27/2016  . BREAST LUMPECTOMY Left 09/15/2014  . CHOLECYSTECTOMY  2000  . DILATION AND CURETTAGE OF UTERUS    . laser eye surgery, detached retina Left   . RADIOACTIVE SEED GUIDED PARTIAL MASTECTOMY WITH AXILLARY SENTINEL LYMPH NODE BIOPSY Left 09/16/2014   Procedure: RADIOACTIVE SEED GUIDED PARTIAL MASTECTOMY WITH AXILLARY SENTINEL LYMPH NODE BIOPSY;  Surgeon: [name], MD;  Location: [facility];  Service: General;  Laterality: Left;  . thyoidectomy Left    partial  . THYROID LOBECTOMY N/A 02/10/2015   Procedure: RIGHT THYROID LOBECTOMY;  Surgeon: [name], MD;  Location: [facility];  Service: General;  Laterality: N/A;  . TONSILLECTOMY    . TUBAL LIGATION      SOCIAL HISTORY: Social History   Tobacco Use  . Smoking status: Never Smoker  . Smokeless tobacco: Never Used  Substance Use Topics  .  Alcohol use: No    Alcohol/week: 0.0 standard drinks  . Drug use: No    FAMILY HISTORY: Family History  Problem Relation Age of Onset  . Breast cancer Sister 46  . Cancer Father        lung cancer ; smoker  . Cancer Brother        3 brothers with lung cancer, all smokers  . Cancer Paternal Uncle        2 pat uncles with lung cancer, smokers  . Kidney cancer Sister 21  . Melanoma Sister 76  . Ovarian cancer Other 71       niece with ovarian cancer (related through sister with breast cancer)  . Colon cancer Neg Hx   . CAD Neg Hx   . Esophageal cancer Neg Hx   . Stomach cancer Neg Hx   . Rectal cancer Neg Hx     ROS: Review of Systems  Constitutional: Positive for weight loss.  Gastrointestinal: Negative for nausea.  Endo/Heme/Allergies:       Negative hypoglycemia    PHYSICAL EXAM: Pt in no acute distress  RECENT LABS AND TESTS: BMET    Component Value Date/Time   NA 141 02/16/2019 1508   NA 143 01/01/2019 1336   NA 140 11/10/2015 1129   K 4.3 02/16/2019 1508   K 4.0  11/10/2015 1129   CL 104 02/16/2019 1508   CO2 25 02/16/2019 1508   CO2 23 11/10/2015 1129   GLUCOSE 120 (H) 02/16/2019 1508   GLUCOSE 98 11/10/2015 1129   BUN 36 (H) 02/16/2019 1508   BUN 29 (H) 01/01/2019 1336   BUN 17.0 11/10/2015 1129   CREATININE 1.10 02/16/2019 1508   CREATININE 0.8 11/10/2015 1129   CALCIUM 9.7 02/16/2019 1508   CALCIUM 9.2 11/10/2015 1129   GFRNONAA 54 (L) 01/01/2019 1336   GFRAA 63 01/01/2019 1336   Lab Results  Component Value Date   HGBA1C 6.0 10/09/2018   HGBA1C 5.8 04/25/2017   HGBA1C 5.7 08/24/2015   Lab Results  Component Value Date   INSULIN 15.9 01/01/2019   CBC    Component Value Date/Time   WBC 5.7 02/16/2019 1508   RBC 4.24 02/16/2019 1508   HGB 13.2 02/16/2019 1508   HGB 10.0 (L) 11/10/2015 1128   HGB 12.3 12/24/2014 1514   HCT 40.3 02/16/2019 1508   HCT 31.8 (L) 11/10/2015 1128   HCT 38.0 12/24/2014 1514   PLT 186.0 02/16/2019 1508   PLT 191 11/10/2015 1128   PLT 176 12/24/2014 1514   MCV 95.2 02/16/2019 1508   MCV 93 11/10/2015 1128   MCV 92.2 12/24/2014 1514   MCH 29.2 11/10/2015 1128   MCH 29.3 10/14/2015 1543   MCHC 32.8 02/16/2019 1508   RDW 13.5 02/16/2019 1508   RDW 14.5 11/10/2015 1128   RDW 13.7 12/24/2014 1514   LYMPHSABS 1.7 02/16/2019 1508   LYMPHSABS 0.9 11/10/2015 1128   LYMPHSABS 1.1 12/24/2014 1514   MONOABS 0.4 02/16/2019 1508   MONOABS 0.3 12/24/2014 1514   EOSABS 0.1 02/16/2019 1508   EOSABS 0.2 11/10/2015 1128   BASOSABS 0.1 02/16/2019 1508   BASOSABS 0.0 11/10/2015 1128   BASOSABS 0.0 12/24/2014 1514   Iron/TIBC/Ferritin/ %Sat    Component Value Date/Time   IRON 30 (L) 11/10/2015 1128   TIBC 265 11/10/2015 1128   FERRITIN 154 11/10/2015 1128   IRONPCTSAT 11 (L) 11/10/2015 1128   Lipid Panel     Component Value Date/Time   CHOL 174  10/09/2018 1124   TRIG 117.0 10/09/2018 1124   HDL 53.30 10/09/2018 1124   CHOLHDL 3 10/09/2018 1124   VLDL 23.4 10/09/2018 1124   LDLCALC 98 10/09/2018  1124   LDLDIRECT 177.2 08/04/2013 1147   Hepatic Function Panel     Component Value Date/Time   PROT 6.4 01/01/2019 1336   PROT 6.3 (L) 11/10/2015 1129   ALBUMIN 4.2 01/01/2019 1336   ALBUMIN 3.1 (L) 11/10/2015 1129   AST 16 01/01/2019 1336   AST 15 11/10/2015 1129   ALT 16 01/01/2019 1336   ALT 14 11/10/2015 1129   ALKPHOS 103 01/01/2019 1336   ALKPHOS 100 11/10/2015 1129   BILITOT 0.8 01/01/2019 1336   BILITOT 0.47 11/10/2015 1129   BILIDIR 0.2 10/15/2007 0000      Component Value Date/Time   TSH 3.54 02/16/2019 1508   TSH 3.11 10/09/2018 1124   TSH 3.11 03/25/2018 1435      I, Trixie Dredge, am acting as transcriptionist for Ilene Qua, MD  I have reviewed the above documentation for accuracy and completeness, and I agree with the above. - Ilene Qua, MD

## 2019-03-19 ENCOUNTER — Telehealth: Payer: Self-pay

## 2019-03-19 NOTE — Telephone Encounter (Signed)
-----   Message from Anson Crofts sent at 03/19/2019  1:25 PM EDT ----- Regarding: Heart monitor Patient left a voicemail asking to speak with you about her heart monitor. Her callback number is [phone].

## 2019-03-19 NOTE — Telephone Encounter (Signed)
Patient calling stating that she has taken the monitor off and will be mailing it back. Informed patient that once we get the results, we will give her a call. Patient verbalized understanding.

## 2019-03-24 ENCOUNTER — Other Ambulatory Visit: Payer: Self-pay

## 2019-03-24 ENCOUNTER — Encounter (INDEPENDENT_AMBULATORY_CARE_PROVIDER_SITE_OTHER): Payer: Self-pay | Admitting: Family Medicine

## 2019-03-24 ENCOUNTER — Ambulatory Visit (INDEPENDENT_AMBULATORY_CARE_PROVIDER_SITE_OTHER): Payer: Medicare Other | Admitting: Family Medicine

## 2019-03-24 DIAGNOSIS — R7303 Prediabetes: Secondary | ICD-10-CM | POA: Diagnosis not present

## 2019-03-24 DIAGNOSIS — E559 Vitamin D deficiency, unspecified: Secondary | ICD-10-CM

## 2019-03-24 DIAGNOSIS — E038 Other specified hypothyroidism: Secondary | ICD-10-CM | POA: Diagnosis not present

## 2019-03-24 DIAGNOSIS — E66812 Obesity, class 2: Secondary | ICD-10-CM

## 2019-03-24 DIAGNOSIS — Z6836 Body mass index (BMI) 36.0-36.9, adult: Secondary | ICD-10-CM

## 2019-03-25 NOTE — Progress Notes (Signed)
Office: [phone]  /  Fax: [phone] TeleHealth Visit:  [name] has verbally consented to this TeleHealth visit today. The patient is located at home, the provider is located at the [employer] [facility] office. The participants in this visit include the listed provider and patient and patient's husband. The visit was conducted today via face time.  HPI:   Chief Complaint: OBESITY Lori Joseph is here to discuss her progress with her obesity treatment plan. She is on the Category 1 plan and is following her eating plan approximately 50 % of the time. She states she is exercising 0 minutes 0 times per week. Lori Joseph voices that she feels she did poorly this past 2 weeks, secondary to casseroles at home and eating off the plan. She is feeling frustrated.  We were unable to weigh the patient today for this TeleHealth visit. She feels as if she has maintained her weight since her last visit. She has lost 10 lbs since starting treatment with [name].  Pre-Diabetes Lori Joseph has a diagnosis of pre-diabetes based on her elevated Hgb A1c at 6.0 on 10/09/18. She was informed this puts her at greater risk of developing diabetes. She is not on any medications and continues to work on diet and exercise to decrease risk of diabetes. She denies nausea or hypoglycemia.  Hypothyroidism Lori Joseph has a diagnosis of hypothyroidism. She is on levothyroxine. Last TSH was within normal limits. She denies hot or cold intolerance or palpitations, but does admit to ongoing fatigue.  Vitamin D Deficiency Lori Joseph has a diagnosis of vitamin D deficiency. She is currently taking prescription Vit D. She notes fatigue and denies nausea, vomiting or muscle weakness.  ASSESSMENT AND PLAN:  Labs orders for today: Hemoglobin A1C, Insulin, Random, Lipid Panel with LDL/HDL Ratio, Vit-D Hydroxy ( Vit D Deficiency Fractures)  Prediabetes  Other specified hypothyroidism  Vitamin D deficiency  Class 2 severe obesity with serious  comorbidity and body mass index (BMI) of 36.0 to 36.9 in adult, unspecified obesity type [name])  PLAN:  Pre-Diabetes Lori Joseph will continue to work on weight loss, exercise, and decreasing simple carbohydrates in her diet to help decrease the risk of diabetes. We dicussed metformin including benefits and risks. She was informed that eating too many simple carbohydrates or too many calories at one sitting increases the likelihood of GI side effects. Lori Joseph declined metformin for now and a prescription was not written today. We will repeat Hgb A1c, insulin, and FLP. Lori Joseph agrees to follow up with our clinic as directed to monitor her progress.  Hypothyroidism Lori Joseph was informed of the importance of good thyroid control to help with weight loss efforts. She was also informed that supertheraputic thyroid levels are dangerous and will not improve weight loss results. We will repeat labs at the end of June. Lori Joseph agrees to follow up with our clinic as directed to monitor her progress.  Vitamin D Deficiency Lori Joseph was informed that low vitamin D levels contributes to fatigue and are associated with obesity, breast, and colon cancer. Lori Joseph agrees to continue taking prescription Vit D @50 ,000 IU every week and will follow up for routine testing of vitamin D, at least 2-3 times per year. She was informed of the risk of over-replacement of vitamin D and agrees to not increase her dose unless she discusses this with [name] first. We will repeat Vit D level. Lori Joseph agrees to follow up with our clinic as directed to monitor her progress.  Obesity Lori Joseph is currently in the action  stage of change. As such, her goal is to continue with weight loss efforts She has agreed to keep a food journal with 350-450 calories and 30+ grams of protein at supper daily and follow the Category 1 plan Lori Joseph has been instructed to work up to a goal of 150 minutes of combined cardio and strengthening exercise per week for weight loss and overall  health benefits. We discussed the following Behavioral Modification Strategies today: increasing lean protein intake, decreasing simple carbohydrates, increasing vegetables, work on meal planning and easy cooking plans, better snacking choices, and planning for success   Lori Joseph has agreed to follow up with our clinic as directed to monitor her progress. She was informed of the importance of frequent follow up visits to maximize her success with intensive lifestyle modifications for her multiple health conditions.  ALLERGIES: Allergies  Allergen Reactions  . Codeine Anaphylaxis  . Hyaluronic Acid  [Collagen-Chond-Hyaluronic Acid] Itching and Rash  . Carvedilol     REACTION: leg pain, edema  . Felodipine     REACTION: HA, insomnia  . Lisinopril     REACTION: HA, SWELLING  . Losartan Potassium     REACTION: cough  . Radiaplexrx [Pyridoxine-Zinc Picolinate] Itching and Rash    MEDICATIONS: Current Outpatient Medications on File Prior to Visit  Medication Sig Dispense Refill  . anastrozole (ARIMIDEX) 1 MG tablet Take 1 tablet (1 mg total) by mouth daily. 90 tablet 3  . calcium carbonate 1250 MG capsule Take 2,500 mg by mouth every morning.     . cholecalciferol (VITAMIN D) 1000 UNITS tablet Take 2,500 Units by mouth every morning.     . dorzolamide-timolol (COSOPT) 22.3-6.8 MG/ML ophthalmic solution Place 1 drop into both eyes every morning.     . hydrochlorothiazide (HYDRODIURIL) 25 MG tablet Take 0.5 tablets (12.5 mg total) by mouth daily.    Marland Kitchen latanoprost (XALATAN) 0.005 % ophthalmic solution PLACE 1 DROP INTO BOTH EYES NIGHTLY.    Marland Kitchen levothyroxine (SYNTHROID, LEVOTHROID) 25 MCG tablet Take 1 tablet (25 mcg total) by mouth daily before breakfast. 90 tablet 1  . metoprolol tartrate (LOPRESSOR) 50 MG tablet Take 1 tablet (50 mg total) by mouth 2 (two) times daily. 180 tablet 1  . Multiple Vitamin (MULTIVITAMIN) tablet Take 1 tablet by mouth every morning.     . pravastatin (PRAVACHOL) 40  MG tablet TAKE 1 TABLET (40 MG TOTAL) BY MOUTH DAILY. 90 tablet 1  . rivaroxaban (XARELTO) 20 MG TABS tablet Take 1 tablet (20 mg total) by mouth daily with supper. 90 tablet 1  . telmisartan (MICARDIS) 40 MG tablet Take 1 tablet (40 mg total) by mouth daily. 90 tablet 1  . Vitamin D, Ergocalciferol, (DRISDOL) 1.25 MG (50000 UT) CAPS capsule Take 1 capsule (50,000 Units total) by mouth every 7 (seven) days. 4 capsule 0   No current facility-administered medications on file prior to visit.     PAST MEDICAL HISTORY: Past Medical History:  Diagnosis Date  . Allergic rhinitis   . Anemia   . Atrial fibrillation ([place]) 03/2017  . Family history of anesthesia complication    daughter hard to wake up  . Glaucoma   . H/O acute pancreatitis   . H/O retinal detachment   . Hurthle cell neoplasm of thyroid    Right   . HYPERLIPIDEMIA   . Hypertension   . Hypothyroidism 05/31/2015  . OSTEOPENIA   . Personal history of radiation therapy 2015  . Primary cancer of upper outer quadrant of left  female breast ([place])    Dr. Lindi Adie  . Radiation 11/11/15-12/30/14   left breast 50.4 gray, lumpectomy cavity boosted to 62.4 gray    PAST SURGICAL HISTORY: Past Surgical History:  Procedure Laterality Date  . BREAST BIOPSY Left 08/27/14  . BREAST BIOPSY Left 11/27/2016  . BREAST LUMPECTOMY Left 09/15/2014  . CHOLECYSTECTOMY  2000  . DILATION AND CURETTAGE OF UTERUS    . laser eye surgery, detached retina Left   . RADIOACTIVE SEED GUIDED PARTIAL MASTECTOMY WITH AXILLARY SENTINEL LYMPH NODE BIOPSY Left 09/16/2014   Procedure: RADIOACTIVE SEED GUIDED PARTIAL MASTECTOMY WITH AXILLARY SENTINEL LYMPH NODE BIOPSY;  Surgeon: [name], MD;  Location: [facility];  Service: General;  Laterality: Left;  . thyoidectomy Left    partial  . THYROID LOBECTOMY N/A 02/10/2015   Procedure: RIGHT THYROID LOBECTOMY;  Surgeon: [name], MD;  Location: [facility];  Service: General;  Laterality: N/A;  .  TONSILLECTOMY    . TUBAL LIGATION      SOCIAL HISTORY: Social History   Tobacco Use  . Smoking status: Never Smoker  . Smokeless tobacco: Never Used  Substance Use Topics  . Alcohol use: No    Alcohol/week: 0.0 standard drinks  . Drug use: No    FAMILY HISTORY: Family History  Problem Relation Age of Onset  . Breast cancer Sister 68  . Cancer Father        lung cancer ; smoker  . Cancer Brother        3 brothers with lung cancer, all smokers  . Cancer Paternal Uncle        2 pat uncles with lung cancer, smokers  . Kidney cancer Sister 4  . Melanoma Sister 68  . Ovarian cancer Other 73       niece with ovarian cancer (related through sister with breast cancer)  . Colon cancer Neg Hx   . CAD Neg Hx   . Esophageal cancer Neg Hx   . Stomach cancer Neg Hx   . Rectal cancer Neg Hx     ROS: Review of Systems  Constitutional: Positive for malaise/fatigue. Negative for weight loss.  Cardiovascular: Negative for palpitations.  Gastrointestinal: Negative for nausea and vomiting.  Musculoskeletal:       Negative muscle weakness  Endo/Heme/Allergies:       Negative hypoglycemia Negative hot/cold intolerance    PHYSICAL EXAM: Pt in no acute distress  RECENT LABS AND TESTS: BMET    Component Value Date/Time   NA 141 02/16/2019 1508   NA 143 01/01/2019 1336   NA 140 11/10/2015 1129   K 4.3 02/16/2019 1508   K 4.0 11/10/2015 1129   CL 104 02/16/2019 1508   CO2 25 02/16/2019 1508   CO2 23 11/10/2015 1129   GLUCOSE 120 (H) 02/16/2019 1508   GLUCOSE 98 11/10/2015 1129   BUN 36 (H) 02/16/2019 1508   BUN 29 (H) 01/01/2019 1336   BUN 17.0 11/10/2015 1129   CREATININE 1.10 02/16/2019 1508   CREATININE 0.8 11/10/2015 1129   CALCIUM 9.7 02/16/2019 1508   CALCIUM 9.2 11/10/2015 1129   GFRNONAA 54 (L) 01/01/2019 1336   GFRAA 63 01/01/2019 1336   Lab Results  Component Value Date   HGBA1C 6.0 10/09/2018   HGBA1C 5.8 04/25/2017   HGBA1C 5.7 08/24/2015   Lab  Results  Component Value Date   INSULIN 15.9 01/01/2019   CBC    Component Value Date/Time   WBC 5.7 02/16/2019 1508   RBC  4.24 02/16/2019 1508   HGB 13.2 02/16/2019 1508   HGB 10.0 (L) 11/10/2015 1128   HGB 12.3 12/24/2014 1514   HCT 40.3 02/16/2019 1508   HCT 31.8 (L) 11/10/2015 1128   HCT 38.0 12/24/2014 1514   PLT 186.0 02/16/2019 1508   PLT 191 11/10/2015 1128   PLT 176 12/24/2014 1514   MCV 95.2 02/16/2019 1508   MCV 93 11/10/2015 1128   MCV 92.2 12/24/2014 1514   MCH 29.2 11/10/2015 1128   MCH 29.3 10/14/2015 1543   MCHC 32.8 02/16/2019 1508   RDW 13.5 02/16/2019 1508   RDW 14.5 11/10/2015 1128   RDW 13.7 12/24/2014 1514   LYMPHSABS 1.7 02/16/2019 1508   LYMPHSABS 0.9 11/10/2015 1128   LYMPHSABS 1.1 12/24/2014 1514   MONOABS 0.4 02/16/2019 1508   MONOABS 0.3 12/24/2014 1514   EOSABS 0.1 02/16/2019 1508   EOSABS 0.2 11/10/2015 1128   BASOSABS 0.1 02/16/2019 1508   BASOSABS 0.0 11/10/2015 1128   BASOSABS 0.0 12/24/2014 1514   Iron/TIBC/Ferritin/ %Sat    Component Value Date/Time   IRON 30 (L) 11/10/2015 1128   TIBC 265 11/10/2015 1128   FERRITIN 154 11/10/2015 1128   IRONPCTSAT 11 (L) 11/10/2015 1128   Lipid Panel     Component Value Date/Time   CHOL 174 10/09/2018 1124   TRIG 117.0 10/09/2018 1124   HDL 53.30 10/09/2018 1124   CHOLHDL 3 10/09/2018 1124   VLDL 23.4 10/09/2018 1124   LDLCALC 98 10/09/2018 1124   LDLDIRECT 177.2 08/04/2013 1147   Hepatic Function Panel     Component Value Date/Time   PROT 6.4 01/01/2019 1336   PROT 6.3 (L) 11/10/2015 1129   ALBUMIN 4.2 01/01/2019 1336   ALBUMIN 3.1 (L) 11/10/2015 1129   AST 16 01/01/2019 1336   AST 15 11/10/2015 1129   ALT 16 01/01/2019 1336   ALT 14 11/10/2015 1129   ALKPHOS 103 01/01/2019 1336   ALKPHOS 100 11/10/2015 1129   BILITOT 0.8 01/01/2019 1336   BILITOT 0.47 11/10/2015 1129   BILIDIR 0.2 10/15/2007 0000      Component Value Date/Time   TSH 3.54 02/16/2019 1508   TSH 3.11  10/09/2018 1124   TSH 3.11 03/25/2018 1435      I, Trixie Dredge, am acting as Location manager for Ilene Qua, MD  I have reviewed the above documentation for accuracy and completeness, and I agree with the above. - Ilene Qua, MD

## 2019-03-31 ENCOUNTER — Other Ambulatory Visit: Payer: Self-pay

## 2019-03-31 ENCOUNTER — Other Ambulatory Visit: Payer: Self-pay | Admitting: Internal Medicine

## 2019-04-01 NOTE — Progress Notes (Signed)
That should be up to Dr. Johnsie Cancel I have not seen pt and if pt having symptoms.

## 2019-04-03 NOTE — Progress Notes (Signed)
Virtual Visit via Video Note   This visit type was conducted due to national recommendations for restrictions regarding the COVID-19 Pandemic (e.g. social distancing) in an effort to limit this patient's exposure and mitigate transmission in our community.  Due to her co-morbid illnesses, this patient is at least at moderate risk for complications without adequate follow up.  This format is felt to be most appropriate for this patient at this time.  All issues noted in this document were discussed and addressed.  A limited physical exam was performed with this format.  Please refer to the patient's chart for her consent to telehealth for [facility]   Date:  04/06/2019   ID:  Lori Joseph, DOB 1941/04/18, MRN [id number]  Patient Location: Home Provider Location: Office  PCP:  Colon Branch, MD  Cardiologist:  Jenkins Rouge, MD  Electrophysiologist:  None   Evaluation Performed:  Follow-Up Visit  Chief Complaint:  Atrial fib  History of Present Illness:    Lori Joseph is a 78 y.o. female first seen in June 2018 for new onset atrial fibrillation by Dr. Johnsie Cancel. .She was started on metoprolol and xaretltoConverted spontaneously   June 2018 Echo ordered and reviewed EF 55-60% mild LAE trivial MR Myovue normal EF 56% no ischemia  She has hypothyroidism TSH normal 04/25/17 2.95 CRF;s include HTN and elevated lipids.   This patients CHA2DS2-VASc Score and unadjusted Ischemic Stroke Rate (% per year) is equal to 4.8 % stroke rate/year from a score of 4   Discussed phone apps and iwatch apps for PAF Had left shoulder pain and had subarcromial steroid injection 11/25/18  Younger sister diagnosed with brain tumor She has passed  At the end of 03/02/2023 pt had palpitations and pre syncope seen by PAZ And noted to be in afib rate 133 Lopressor increased and diuretic decreased She has likely been going in/out of PAF as pulse now is in 50's   Monitoring was done and NSR with  rate PACs no PAF or significant arrhythmia.    Today for follow up of a fib. She has not really had any further a fib.  No dizziness or lightheadedness with HR in the 50s.  No blood in stools or urine. She is feeling well.  No colds or fevers and wears mask with grocery shopping.   Her BP is good today.  She is active with walking around house and keeping up a garden.    The patient does not have symptoms concerning for COVID-19 infection (fever, chills, cough, or new shortness of breath).    Past Medical History:  Diagnosis Date  . Allergic rhinitis   . Anemia   . Atrial fibrillation ([place]) 03/2017  . Family history of anesthesia complication    daughter hard to wake up  . Glaucoma   . H/O acute pancreatitis   . H/O retinal detachment   . Hurthle cell neoplasm of thyroid    Right   . HYPERLIPIDEMIA   . Hypertension   . Hypothyroidism 05/31/2015  . OSTEOPENIA   . Personal history of radiation therapy 03-01-2014  . Primary cancer of upper outer quadrant of left female breast ([place])    Dr. Lindi Adie  . Radiation 11/11/15-12/30/14   left breast 50.4 gray, lumpectomy cavity boosted to 62.4 gray   Past Surgical History:  Procedure Laterality Date  . BREAST BIOPSY Left 08/27/14  . BREAST BIOPSY Left 11/27/2016  . BREAST LUMPECTOMY Left 09/15/2014  . CHOLECYSTECTOMY  2000  .  DILATION AND CURETTAGE OF UTERUS    . laser eye surgery, detached retina Left   . RADIOACTIVE SEED GUIDED PARTIAL MASTECTOMY WITH AXILLARY SENTINEL LYMPH NODE BIOPSY Left 09/16/2014   Procedure: RADIOACTIVE SEED GUIDED PARTIAL MASTECTOMY WITH AXILLARY SENTINEL LYMPH NODE BIOPSY;  Surgeon: [name], MD;  Location: [facility];  Service: General;  Laterality: Left;  . thyoidectomy Left    partial  . THYROID LOBECTOMY N/A 02/10/2015   Procedure: RIGHT THYROID LOBECTOMY;  Surgeon: [name], MD;  [facility];  Service: General;  Laterality: N/A;  . TONSILLECTOMY    . TUBAL LIGATION       Current Meds   Medication Sig  . anastrozole (ARIMIDEX) 1 MG tablet Take 1 tablet (1 mg total) by mouth daily.  . calcium carbonate 1250 MG capsule Take 2,500 mg by mouth every morning.   . cholecalciferol (VITAMIN D) 1000 UNITS tablet Take 2,500 Units by mouth every morning.   . dorzolamide-timolol (COSOPT) 22.3-6.8 MG/ML ophthalmic solution Place 1 drop into both eyes every morning.   . hydrochlorothiazide (HYDRODIURIL) 25 MG tablet Take 0.5 tablets (12.5 mg total) by mouth daily.  Marland Kitchen latanoprost (XALATAN) 0.005 % ophthalmic solution PLACE 1 DROP INTO BOTH EYES NIGHTLY.  Marland Kitchen levothyroxine (SYNTHROID, LEVOTHROID) 25 MCG tablet Take 1 tablet (25 mcg total) by mouth daily before breakfast.  . metoprolol tartrate (LOPRESSOR) 50 MG tablet Take 1 tablet (50 mg total) by mouth 2 (two) times daily.  . Multiple Vitamin (MULTIVITAMIN) tablet Take 1 tablet by mouth every morning.   . pravastatin (PRAVACHOL) 40 MG tablet TAKE 1 TABLET (40 MG TOTAL) BY MOUTH DAILY.  . rivaroxaban (XARELTO) 20 MG TABS tablet Take 1 tablet (20 mg total) by mouth daily with supper.  . telmisartan (MICARDIS) 40 MG tablet Take 1 tablet (40 mg total) by mouth daily.  . Vitamin D, Ergocalciferol, (DRISDOL) 1.25 MG (50000 UT) CAPS capsule Take 1 capsule (50,000 Units total) by mouth every 7 (seven) days.     Allergies:   Codeine; Hyaluronic acid  [collagen-chond-hyaluronic acid]; Carvedilol; Felodipine; Lisinopril; Losartan potassium; and Radiaplexrx [pyridoxine-zinc picolinate]   Social History   Tobacco Use  . Smoking status: Never Smoker  . Smokeless tobacco: Never Used  Substance Use Topics  . Alcohol use: No    Alcohol/week: 0.0 standard drinks  . Drug use: No     Family Hx: The patient's family history includes Breast cancer (age of onset: 61) in her sister; Cancer in her brother, father, and paternal uncle; Kidney cancer (age of onset: 57) in her sister; Melanoma (age of onset: 74) in her sister; Ovarian cancer (age of onset: 10)  in an other family member. There is no history of Colon cancer, CAD, Esophageal cancer, Stomach cancer, or Rectal cancer.  ROS:   Please see the history of present illness.    General:no colds or fevers, no weight changes Skin:no rashes or ulcers HEENT:no blurred vision, no congestion CV:see HPI PUL:see HPI GI:no diarrhea constipation or melena, no indigestion GU:no hematuria, no dysuria MS:no joint pain, no claudication Neuro:no syncope, no lightheadedness Endo:no diabetes, + thyroid disease  All other systems reviewed and are negative.   Prior CV studies:   The following studies were reviewed today:  Cardiac event monitor 4/20  Notes recorded by Josue Hector, MD on 03/31/2019 at 12:04 PM EDT NSR rate PAC;s no PAF no significant arrhythmia  Echo 05/08/17 Study Conclusions  - Left ventricle: The cavity size was normal. Wall thickness was  normal. Systolic function was normal. The estimated ejection   fraction was in the range of 55% to 60%. Wall motion was normal;   there were no regional wall motion abnormalities. There was no   evidence of elevated ventricular filling pressure by Doppler   parameters. - Aortic valve: Trileaflet; mildly thickened, mildly calcified   leaflets. - Mitral valve: Calcified annulus. There was trivial regurgitation. - Left atrium: The atrium was mildly dilated. - Tricuspid valve: There was mild regurgitation.   Labs/Other Tests and Data Reviewed:    EKG:  An ECG dated 02/16/19 was personally reviewed today and demonstrated:  a fib with RVR at 133, nonspecific T wave abnromality  Recent Labs: 01/01/2019: ALT 16 02/16/2019: BUN 36; Creatinine, Ser 1.10; Hemoglobin 13.2; Platelets 186.0; Potassium 4.3; Sodium 141; TSH 3.54   Recent Lipid Panel Lab Results  Component Value Date/Time   CHOL 174 10/09/2018 11:24 AM   TRIG 117.0 10/09/2018 11:24 AM   HDL 53.30 10/09/2018 11:24 AM   CHOLHDL 3 10/09/2018 11:24 AM   LDLCALC 98 10/09/2018 11:24  AM   LDLDIRECT 177.2 08/04/2013 11:47 AM    Wt Readings from Last 3 Encounters:  04/06/19 205 lb (93 kg)  02/23/19 208 lb (94.3 kg)  02/16/19 208 lb 8 oz (94.6 kg)     Objective:    Vital Signs:  BP 123/79   Pulse (!) 57   Ht 5\' 3"  (1.6 m)   Wt 205 lb (93 kg)   LMP  (LMP Unknown)   BMI 36.31 kg/m    VITAL SIGNS:  reviewed  General Awake healthy female in NAD Neuro alert and oriented X 3 MAE follows commands Lungs, no SOB can complete full sentences without SOB Psych: pleasant affect  ASSESSMENT & PLAN:    1. PAF with increased dose of BB no further episodes by symptoms and monitor.   Continue current dose of BB 2. anticoagulation on xarelto.  Continue 3. Hypothyroid on replacement followed by PCP  COVID-19 Education: The signs and symptoms of COVID-19 were discussed with the patient and how to seek care for testing (follow up with PCP or arrange E-visit).  The importance of social distancing was discussed today.  Time:   Today, I have spent 10 minutes with the patient with telehealth technology discussing the above problems.     Medication Adjustments/Labs and Tests Ordered: Current medicines are reviewed at length with the patient today.  Concerns regarding medicines are outlined above.   Tests Ordered: No orders of the defined types were placed in this encounter.   Medication Changes: No orders of the defined types were placed in this encounter.   Disposition:  Follow up in 6 month(s)  Signed, Cecilie Kicks, NP  04/06/2019 10:26 AM    [place]

## 2019-04-06 ENCOUNTER — Telehealth (INDEPENDENT_AMBULATORY_CARE_PROVIDER_SITE_OTHER): Payer: Medicare Other | Admitting: Cardiology

## 2019-04-06 ENCOUNTER — Other Ambulatory Visit: Payer: Self-pay

## 2019-04-06 ENCOUNTER — Encounter: Payer: Self-pay | Admitting: Cardiology

## 2019-04-06 VITALS — BP 123/79 | HR 57 | Ht 63.0 in | Wt 205.0 lb

## 2019-04-06 DIAGNOSIS — E039 Hypothyroidism, unspecified: Secondary | ICD-10-CM

## 2019-04-06 DIAGNOSIS — Z7901 Long term (current) use of anticoagulants: Secondary | ICD-10-CM

## 2019-04-06 DIAGNOSIS — I480 Paroxysmal atrial fibrillation: Secondary | ICD-10-CM | POA: Diagnosis not present

## 2019-04-06 NOTE — Patient Instructions (Signed)
Medication Instructions:  Your physician recommends that you continue on your current medications as directed. Please refer to the Current Medication list given to you today.  If you need a refill on your cardiac medications before your next appointment, please call your pharmacy.   Lab work: NONE ORDERED  If you have labs (blood work) drawn today and your tests are completely normal, you will receive your results only by: Marland Kitchen MyChart Message (if you have MyChart) OR . A paper copy in the mail If you have any lab test that is abnormal or we need to change your treatment, we will call you to review the results.  Testing/Procedures: NONE ORDERED   Follow-Up: At [facility], you and your health needs are our priority.  As part of our continuing mission to provide you with exceptional heart care, we have created designated Provider Care Teams.  These Care Teams include your primary Cardiologist (physician) and Advanced Practice Providers (APPs -  Physician Assistants and Nurse Practitioners) who all work together to provide you with the care you need, when you need it. You will need a follow up appointment in 6 months.  Please call our office 2 months in advance to schedule this appointment.  You may see Jenkins Rouge, MD or one of the following Advanced Practice Providers on your designated Care Team:   Truitt Merle, NP Cecilie Kicks, NP . Kathyrn Drown, NP  Any Other Special Instructions Will Be Listed Below (If Applicable).

## 2019-04-08 ENCOUNTER — Other Ambulatory Visit: Payer: Self-pay | Admitting: General Surgery

## 2019-04-08 DIAGNOSIS — E041 Nontoxic single thyroid nodule: Secondary | ICD-10-CM

## 2019-04-14 ENCOUNTER — Other Ambulatory Visit: Payer: Self-pay

## 2019-04-14 ENCOUNTER — Ambulatory Visit (INDEPENDENT_AMBULATORY_CARE_PROVIDER_SITE_OTHER): Payer: Medicare Other | Admitting: Internal Medicine

## 2019-04-14 DIAGNOSIS — I10 Essential (primary) hypertension: Secondary | ICD-10-CM | POA: Diagnosis not present

## 2019-04-14 DIAGNOSIS — I480 Paroxysmal atrial fibrillation: Secondary | ICD-10-CM | POA: Diagnosis not present

## 2019-04-14 DIAGNOSIS — M25519 Pain in unspecified shoulder: Secondary | ICD-10-CM | POA: Diagnosis not present

## 2019-04-14 NOTE — Assessment & Plan Note (Signed)
P. Atrial fibrillation: She was seen 01-2019 with fatigue, she was found to be on A. fib (typically she is in on NSR), metoprolol dose increased, HCT dose decreased.  Subsequently saw cardiology, a  monitor was done, had NSR with PACs, no A. fib or significant arrhythmia.  Currently doing well, no change, continue Xarelto, metoprolol. HTN: Continue HCTZ, metoprolol, Micardis. Shoulder pain: Has seen a sports medicine twice, got temporarily better after injection but pain has resurfaced, we talk about options and we agreed to see Ortho. Morbid obesity: Working with a wellness clinic on her weight loss. RTC 09-2019, sooner if problems, call with questions

## 2019-04-14 NOTE — Progress Notes (Signed)
Subjective:    Patient ID: Lori Joseph, female    DOB: January 18, 1941, 78 y.o.   MRN: [id number]  DOS:  04/14/2019 Attempted  to make this a video visit, due to technical difficulties from the patient side it was not possible  thus we proceeded with a Virtual Visit via Telephone    I connected with@ on 04/14/19 at 10:20 AM EDT by telephone and verified that I am speaking with the correct person using two identifiers.  THIS ENCOUNTER IS A VIRTUAL VISIT DUE TO COVID-19 - PATIENT WAS NOT SEEN IN THE OFFICE. PATIENT HAS CONSENTED TO VIRTUAL VISIT / TELEMEDICINE VISIT   Location of patient: home  Location of provider: office  I discussed the limitations, risks, security and privacy concerns of performing an evaluation and management service by telephone and the availability of in person appointments. I also discussed with the patient that there may be a patient responsible charge related to this service. The patient expressed understanding and agreed to proceed.   History of Present Illness: Routine visit Atrial fibrillation: Notes from cardiology reviewed Feeling better, no fatigue, good compliance with medication. Labs were reviewed.  She continued to have left shoulder pain.  Review of Systems No fever chills No chest pain no difficulty breathing.  No lower extremity edema No cough  Past Medical History:  Diagnosis Date  . Allergic rhinitis   . Anemia   . Atrial fibrillation ([place]) 03/2017  . Family history of anesthesia complication    daughter hard to wake up  . Glaucoma   . H/O acute pancreatitis   . H/O retinal detachment   . Hurthle cell neoplasm of thyroid    Right   . HYPERLIPIDEMIA   . Hypertension   . Hypothyroidism 05/31/2015  . OSTEOPENIA   . Personal history of radiation therapy 2015  . Primary cancer of upper outer quadrant of left female breast ([place])    Dr. Lindi Adie  . Radiation 11/11/15-12/30/14   left breast 50.4 gray, lumpectomy cavity boosted to 62.4 gray    Past Surgical History:  Procedure Laterality Date  . BREAST BIOPSY Left 08/27/14  . BREAST BIOPSY Left 11/27/2016  . BREAST LUMPECTOMY Left 09/15/2014  . CHOLECYSTECTOMY  2000  . DILATION AND CURETTAGE OF UTERUS    . laser eye surgery, detached retina Left   . RADIOACTIVE SEED GUIDED PARTIAL MASTECTOMY WITH AXILLARY SENTINEL LYMPH NODE BIOPSY Left 09/16/2014   Procedure: RADIOACTIVE SEED GUIDED PARTIAL MASTECTOMY WITH AXILLARY SENTINEL LYMPH NODE BIOPSY;  Surgeon: [name], MD;  Location: [facility];  Service: General;  Laterality: Left;  . thyoidectomy Left    partial  . THYROID LOBECTOMY N/A 02/10/2015   Procedure: RIGHT THYROID LOBECTOMY;  Surgeon: [name], MD;  Location: [facility];  Service: General;  Laterality: N/A;  . TONSILLECTOMY    . TUBAL LIGATION      Social History   Socioeconomic History  . Marital status: Married    Spouse name: Ruthann Cancer  . Number of children: 2  . Years of education: Not on file  . Highest education level: Not on file  Occupational History  . Occupation: RETIRED-- Retail banker: [employer]  Social Needs  . Financial resource strain: Not on file  . Food insecurity:    Worry: Not on file    Inability: Not on file  . Transportation needs:    Medical: Not on file    Non-medical: Not on file  Tobacco Use  .  Smoking status: Never Smoker  . Smokeless tobacco: Never Used  Substance and Sexual Activity  . Alcohol use: No    Alcohol/week: 0.0 standard drinks  . Drug use: No  . Sexual activity: Not on file  Lifestyle  . Physical activity:    Days per week: Not on file    Minutes per session: Not on file  . Stress: Not on file  Relationships  . Social connections:    Talks on phone: Not on file    Gets together: Not on file    Attends religious service: Not on file    Active member of club or organization: Not on file    Attends meetings of clubs or organizations: Not on file    Relationship  status: Not on file  . Intimate partner violence:    Fear of current or ex partner: Not on file    Emotionally abused: Not on file    Physically abused: Not on file    Forced sexual activity: Not on file  Other Topics Concern  . Not on file  Social History Narrative   Lives w/ husband      Allergies as of 04/14/2019      Reactions   Codeine Anaphylaxis   Hyaluronic Acid  [collagen-chond-hyaluronic Acid] Itching, Rash   Carvedilol    REACTION: leg pain, edema   Felodipine    REACTION: HA, insomnia   Lisinopril    REACTION: HA, SWELLING   Losartan Potassium    REACTION: cough   Radiaplexrx [pyridoxine-zinc Picolinate] Itching, Rash      Medication List       Accurate as of Apr 14, 2019  3:53 PM. If you have any questions, ask your nurse or doctor.        anastrozole 1 MG tablet Commonly known as:  ARIMIDEX Take 1 tablet (1 mg total) by mouth daily.   calcium carbonate 1250 MG capsule Take 2,500 mg by mouth every morning.   cholecalciferol 1000 units tablet Commonly known as:  VITAMIN D Take 2,500 Units by mouth every morning.   dorzolamide-timolol 22.3-6.8 MG/ML ophthalmic solution Commonly known as:  COSOPT Place 1 drop into both eyes every morning.   hydrochlorothiazide 25 MG tablet Commonly known as:  HYDRODIURIL Take 0.5 tablets (12.5 mg total) by mouth daily.   latanoprost 0.005 % ophthalmic solution Commonly known as:  XALATAN PLACE 1 DROP INTO BOTH EYES NIGHTLY.   levothyroxine 25 MCG tablet Commonly known as:  SYNTHROID Take 1 tablet (25 mcg total) by mouth daily before breakfast.   metoprolol tartrate 50 MG tablet Commonly known as:  LOPRESSOR Take 1 tablet (50 mg total) by mouth 2 (two) times daily.   multivitamin tablet Take 1 tablet by mouth every morning.   pravastatin 40 MG tablet Commonly known as:  PRAVACHOL TAKE 1 TABLET (40 MG TOTAL) BY MOUTH DAILY.   rivaroxaban 20 MG Tabs tablet Commonly known as:  Xarelto Take 1 tablet (20  mg total) by mouth daily with supper.   telmisartan 40 MG tablet Commonly known as:  MICARDIS Take 1 tablet (40 mg total) by mouth daily.   Vitamin D (Ergocalciferol) 1.25 MG (50000 UT) Caps capsule Commonly known as:  DRISDOL Take 1 capsule (50,000 Units total) by mouth every 7 (seven) days.           Objective:   Physical Exam LMP  (LMP Unknown)  This is telephone virtual visit, alert oriented x3, no apparent distress    Assessment  Assessment  Hyperglycemia (a1c= 6.0 11.2019) HTN Hyperlipidemia Hypothyroidism Osteopenia: T score 2010 normal,  T score 05-2012 (-1.2), on calcium and vitamin D Morbid Obesity CV: -A. fib, new onset 03-2017 -Low risk stress test 04-2017 Oncology: --Breast cancer, left, dx  2015, XRT 10/2015 to 12/2014, on Arimidex --Hurthle Cell neoplasm of the thyroid, thyroidectomy 01-2015 SKIN: ---Eczema ---[place] 2012, Dr Allyson Sabal ANEMIA- saw hematology, rx IV iron 10-2015 Chronic right-sided chest pain:  x-rays CT abdomen and pelvis 2016 negative, saw pain mngmt, ortho ---> better with Lidoderm patch and a local injection in the back Glaucoma   H/o acute pancreatitis H/o retinal  detachment Pulmonary: Abnormal chest x-ray and CT chest: Last CT chest 01-2016: See report. CXR 03/27/2016: No acute. Pulmonary: f/u prn ++ FH Lung Ca (pt is not a smoker)  PLAN: P. Atrial fibrillation: She was seen 01-2019 with fatigue, she was found to be on A. fib (typically she is in on NSR), metoprolol dose increased, HCT dose decreased.  Subsequently saw cardiology, a  monitor was done, had NSR with PACs, no A. fib or significant arrhythmia.  Currently doing well, no change, continue Xarelto, metoprolol. HTN: Continue HCTZ, metoprolol, Micardis. Shoulder pain: Has seen a sports medicine twice, got temporarily better after injection but pain has resurfaced, we talk about options and we agreed to see Ortho. Morbid obesity: Working with a wellness clinic on her weight loss.  RTC 09-2019, sooner if problems, call with questions     I discussed the assessment and treatment plan with the patient. The patient was provided an opportunity to ask questions and all were answered. The patient agreed with the plan and demonstrated an understanding of the instructions.   The patient was advised to call back or seek an in-person evaluation if the symptoms worsen or if the condition fails to improve as anticipated.  I provided 18 minutes of non-face-to-face time during this encounter.  Kathlene November, MD

## 2019-04-23 DIAGNOSIS — M25512 Pain in left shoulder: Secondary | ICD-10-CM | POA: Diagnosis not present

## 2019-04-30 ENCOUNTER — Ambulatory Visit
Admission: RE | Admit: 2019-04-30 | Discharge: 2019-04-30 | Disposition: A | Discharge status: Home / Self Care | Payer: Medicare Other | Source: Ambulatory Visit | Attending: General Surgery | Admitting: General Surgery

## 2019-04-30 DIAGNOSIS — E041 Nontoxic single thyroid nodule: Secondary | ICD-10-CM

## 2019-05-04 NOTE — Progress Notes (Signed)
Please let patient know thyroid u/s looks good.  Doesn't need follow up.

## 2019-05-28 ENCOUNTER — Other Ambulatory Visit: Payer: Self-pay | Admitting: Internal Medicine

## 2019-06-10 ENCOUNTER — Other Ambulatory Visit: Payer: Self-pay | Admitting: Internal Medicine

## 2019-06-30 ENCOUNTER — Other Ambulatory Visit: Payer: Self-pay | Admitting: Internal Medicine

## 2019-07-03 DIAGNOSIS — H40023 Open angle with borderline findings, high risk, bilateral: Secondary | ICD-10-CM | POA: Diagnosis not present

## 2019-08-13 ENCOUNTER — Other Ambulatory Visit: Payer: Self-pay

## 2019-08-13 ENCOUNTER — Ambulatory Visit (INDEPENDENT_AMBULATORY_CARE_PROVIDER_SITE_OTHER): Payer: Medicare Other

## 2019-08-13 ENCOUNTER — Other Ambulatory Visit: Payer: Self-pay | Admitting: Internal Medicine

## 2019-08-13 DIAGNOSIS — Z23 Encounter for immunization: Secondary | ICD-10-CM | POA: Diagnosis not present

## 2019-08-13 NOTE — Progress Notes (Signed)
Flu shot given to patient w/o any complications 

## 2019-08-22 ENCOUNTER — Other Ambulatory Visit: Payer: Self-pay | Admitting: Internal Medicine

## 2019-09-14 DIAGNOSIS — H40023 Open angle with borderline findings, high risk, bilateral: Secondary | ICD-10-CM | POA: Diagnosis not present

## 2019-09-22 ENCOUNTER — Telehealth: Payer: Self-pay | Admitting: Cardiovascular Disease

## 2019-09-22 NOTE — Telephone Encounter (Signed)
No interaction between Vicodin and Xarelto, ok for pt to take both.

## 2019-09-22 NOTE — Telephone Encounter (Signed)
New Message  Pt c/o medication issue:  1. Name of Medication: Vicodin 5 mg  2. How are you currently taking this medication (dosage and times per day)? 5 mg taken 1 tablet every 8 hours for pain.  3. Are you having a reaction (difficulty breathing--STAT)? No  4. What is your medication issue? Patient would like to know if it is ok for her to take the Vicodin that her dentist gave her with her being on Xarelto. Please give patient a call back to discuss.

## 2019-09-22 NOTE — Telephone Encounter (Signed)
Called patient about her message. Informed patient that there is no interaction between Vicodin and Xarelto according to [facility]. Will double check with our Pharm D, will send them message. Will call patient back with advisement.

## 2019-09-22 NOTE — Telephone Encounter (Signed)
Patient aware of advisement.

## 2019-10-15 ENCOUNTER — Telehealth: Payer: Self-pay | Admitting: Internal Medicine

## 2019-10-15 ENCOUNTER — Other Ambulatory Visit: Payer: Self-pay

## 2019-10-15 NOTE — Telephone Encounter (Signed)
Patient declined AWV at this time. SF °

## 2019-10-16 ENCOUNTER — Other Ambulatory Visit: Payer: Self-pay

## 2019-10-16 ENCOUNTER — Ambulatory Visit (INDEPENDENT_AMBULATORY_CARE_PROVIDER_SITE_OTHER): Payer: Medicare Other | Admitting: Internal Medicine

## 2019-10-16 ENCOUNTER — Encounter: Payer: Self-pay | Admitting: Internal Medicine

## 2019-10-16 VITALS — BP 138/76 | HR 89 | Temp 96.9°F | Resp 16 | Ht 63.0 in | Wt 207.5 lb

## 2019-10-16 DIAGNOSIS — E559 Vitamin D deficiency, unspecified: Secondary | ICD-10-CM

## 2019-10-16 DIAGNOSIS — R739 Hyperglycemia, unspecified: Secondary | ICD-10-CM | POA: Diagnosis not present

## 2019-10-16 DIAGNOSIS — E039 Hypothyroidism, unspecified: Secondary | ICD-10-CM | POA: Diagnosis not present

## 2019-10-16 DIAGNOSIS — Z85828 Personal history of other malignant neoplasm of skin: Secondary | ICD-10-CM

## 2019-10-16 DIAGNOSIS — Z808 Family history of malignant neoplasm of other organs or systems: Secondary | ICD-10-CM

## 2019-10-16 DIAGNOSIS — Z0000 Encounter for general adult medical examination without abnormal findings: Secondary | ICD-10-CM

## 2019-10-16 LAB — TSH: TSH: 3.81 u[IU]/mL (ref 0.35–4.50)

## 2019-10-16 LAB — COMPREHENSIVE METABOLIC PANEL
ALT: 15 U/L (ref 0–35)
AST: 15 U/L (ref 0–37)
Albumin: 3.9 g/dL (ref 3.5–5.2)
Alkaline Phosphatase: 87 U/L (ref 39–117)
BUN: 25 mg/dL — ABNORMAL HIGH (ref 6–23)
CO2: 26 mEq/L (ref 19–32)
Calcium: 8.9 mg/dL (ref 8.4–10.5)
Chloride: 104 mEq/L (ref 96–112)
Creatinine, Ser: 0.96 mg/dL (ref 0.40–1.20)
GFR: 56.14 mL/min — ABNORMAL LOW (ref >60.00)
Glucose, Bld: 107 mg/dL — ABNORMAL HIGH (ref 70–99)
Potassium: 4.1 mEq/L (ref 3.5–5.1)
Sodium: 141 mEq/L (ref 135–145)
Total Bilirubin: 0.9 mg/dL (ref 0.2–1.2)
Total Protein: 5.8 g/dL — ABNORMAL LOW (ref 6.0–8.3)

## 2019-10-16 LAB — LIPID PANEL
Cholesterol: 173 mg/dL (ref 0–200)
HDL: 50.7 mg/dL (ref >39.00)
LDL Cholesterol: 100 mg/dL — ABNORMAL HIGH (ref 0–99)
NonHDL: 122.6
Total CHOL/HDL Ratio: 3
Triglycerides: 113 mg/dL (ref 0.0–149.0)
VLDL: 22.6 mg/dL (ref 0.0–40.0)

## 2019-10-16 LAB — HEMOGLOBIN A1C: Hgb A1c MFr Bld: 5.9 % (ref 4.6–6.5)

## 2019-10-16 LAB — VITAMIN D 25 HYDROXY (VIT D DEFICIENCY, FRACTURES): VITD: 28.5 ng/mL — ABNORMAL LOW (ref 30.00–100.00)

## 2019-10-16 NOTE — Patient Instructions (Addendum)
Please schedule Medicare Wellness with [facility].   GO TO THE LAB : Get the blood work     GO TO THE FRONT DESK Schedule your next appointment   for a checkup in 6 months    

## 2019-10-16 NOTE — Progress Notes (Signed)
Date:  10/27/2019   ID:  Lori Joseph, DOB 1941/08/15, MRN [id number]   Provider Location: Office  PCP:  Colon Branch, MD  Cardiologist:  Jenkins Rouge, MD  Electrophysiologist:  None   Evaluation Performed:  Follow-Up Visit  Chief Complaint:  Atrial fib  History of Present Illness:    Lori Joseph is a 77 y.o. female first seen in June 2018 for new onset atrial fibrillation. She was started on metoprolol and xaretltoConverted spontaneously   June 2018 Echo ordered and reviewed EF 55-60% mild LAE trivial MR Myovue normal EF 56% no ischemia  She has hypothyroidism TSH normal 10/16/19  CRF;s include HTN and elevated lipids.   This patients CHA2DS2-VASc Score and unadjusted Ischemic Stroke Rate (% per year) is equal to 4.8 % stroke rate/year from a score of 4  Discussed phone apps and iwatch apps for PAF Had left shoulder pain and had subarcromial steroid injection 11/25/18  Younger sister diagnosed with brain tumor She has passed  At the end of 02-23-2023 pt had palpitations and pre syncope seen by PAZ And noted to be in afib rate 133 Lopressor increased and diuretic decreased She has likely been going in/out of PAF as pulse now is in 50's   Monitoring was done and NSR with PACs no PAF or significant arrhythmia.    She has not really had any further a fib. More sedentary during [place] Husband over 340 lbs And needs a TKR but cant lose weight HR low and she does not want to take bid lopressor   The patient does not have symptoms concerning for COVID-19 infection (fever, chills, cough, or new shortness of breath).    Past Medical History:  Diagnosis Date  . Allergic rhinitis   . Anemia   . Atrial fibrillation ([place]) 03/2017  . Family history of anesthesia complication    daughter hard to wake up  . Glaucoma   . H/O acute pancreatitis   . H/O retinal detachment   . Hurthle cell neoplasm of thyroid    Right   . HYPERLIPIDEMIA   . Hypertension   .  Hypothyroidism 05/31/2015  . OSTEOPENIA   . Personal history of radiation therapy 2014/02/22  . Primary cancer of upper outer quadrant of left female breast ([place])    Dr. Lindi Adie  . Radiation 11/11/15-12/30/14   left breast 50.4 gray, lumpectomy cavity boosted to 62.4 gray   Past Surgical History:  Procedure Laterality Date  . BREAST BIOPSY Left 08/27/14  . BREAST BIOPSY Left 11/27/2016  . BREAST LUMPECTOMY Left 09/15/2014  . CHOLECYSTECTOMY  2000  . DILATION AND CURETTAGE OF UTERUS    . laser eye surgery, detached retina Left   . RADIOACTIVE SEED GUIDED PARTIAL MASTECTOMY WITH AXILLARY SENTINEL LYMPH NODE BIOPSY Left 09/16/2014   Procedure: RADIOACTIVE SEED GUIDED PARTIAL MASTECTOMY WITH AXILLARY SENTINEL LYMPH NODE BIOPSY;  Surgeon: [name], MD;  Location: [facility];  Service: General;  Laterality: Left;  . thyoidectomy Left    partial  . THYROID LOBECTOMY N/A 02/10/2015   Procedure: RIGHT THYROID LOBECTOMY;  Surgeon: [name], MD;  Location: [facility];  Service: General;  Laterality: N/A;  . TONSILLECTOMY    . TUBAL LIGATION       Current Meds  Medication Sig  . anastrozole (ARIMIDEX) 1 MG tablet Take 1 tablet (1 mg total) by mouth daily.  . calcium carbonate 1250 MG capsule Take 2,500 mg by mouth every morning.   . cholecalciferol (VITAMIN  D) 1000 UNITS tablet Take 2,500 Units by mouth every morning.   . dorzolamide-timolol (COSOPT) 22.3-6.8 MG/ML ophthalmic solution Place 1 drop into both eyes every morning.   . hydrochlorothiazide (HYDRODIURIL) 25 MG tablet Take 0.5 tablets (12.5 mg total) by mouth daily.  Marland Kitchen latanoprost (XALATAN) 0.005 % ophthalmic solution PLACE 1 DROP INTO BOTH EYES NIGHTLY.  Marland Kitchen levothyroxine (SYNTHROID) 25 MCG tablet Take 1 tablet (25 mcg total) by mouth daily before breakfast.  . Multiple Vitamin (MULTIVITAMIN) tablet Take 1 tablet by mouth every morning.   . pravastatin (PRAVACHOL) 40 MG tablet Take 1 tablet (40 mg total) by mouth daily.  .  rivaroxaban (XARELTO) 20 MG TABS tablet Take 1 tablet (20 mg total) by mouth daily with supper.  . telmisartan (MICARDIS) 40 MG tablet Take 1 tablet (40 mg total) by mouth daily.  . Vitamin D, Ergocalciferol, (DRISDOL) 1.25 MG (50000 UT) CAPS capsule Take 1 capsule (50,000 Units total) by mouth every 7 (seven) days.  . [DISCONTINUED] metoprolol tartrate (LOPRESSOR) 50 MG tablet Take 1 tablet (50 mg total) by mouth 2 (two) times daily.     Allergies:   Codeine, Hyaluronic acid  [collagen-chond-hyaluronic acid], Carvedilol, Felodipine, Lisinopril, Losartan potassium, and Radiaplexrx [pyridoxine-zinc picolinate]   Social History   Tobacco Use  . Smoking status: Never Smoker  . Smokeless tobacco: Never Used  Substance Use Topics  . Alcohol use: No    Alcohol/week: 0.0 standard drinks  . Drug use: No     Family Hx: The patient's family history includes Breast cancer (age of onset: 67) in her sister; Cancer in her brother, father, and paternal uncle; Kidney cancer (age of onset: 20) in her sister; Melanoma (age of onset: 50) in her sister; Ovarian cancer (age of onset: 13) in an other family member. There is no history of Colon cancer, CAD, Esophageal cancer, Stomach cancer, or Rectal cancer.  ROS:   Please see the history of present illness.    General:no colds or fevers, no weight changes Skin:no rashes or ulcers HEENT:no blurred vision, no congestion CV:see HPI PUL:see HPI GI:no diarrhea constipation or melena, no indigestion GU:no hematuria, no dysuria MS:no joint pain, no claudication Neuro:no syncope, no lightheadedness Endo:no diabetes, + thyroid disease  All other systems reviewed and are negative.   Prior CV studies:   The following studies were reviewed today:  Cardiac event monitor 4/20  Notes recorded by Josue Hector, MD on 03/31/2019 at 12:04 PM EDT NSR rate PAC;s no PAF no significant arrhythmia  Echo 05/08/17 Study Conclusions  - Left ventricle: The cavity  size was normal. Wall thickness was   normal. Systolic function was normal. The estimated ejection   fraction was in the range of 55% to 60%. Wall motion was normal;   there were no regional wall motion abnormalities. There was no   evidence of elevated ventricular filling pressure by Doppler   parameters. - Aortic valve: Trileaflet; mildly thickened, mildly calcified   leaflets. - Mitral valve: Calcified annulus. There was trivial regurgitation. - Left atrium: The atrium was mildly dilated. - Tricuspid valve: There was mild regurgitation.   Labs/Other Tests and Data Reviewed:    EKG:   02/16/19 afib rate 133 nonspecific ST changes    Recent Labs: 02/16/2019: Hemoglobin 13.2; Platelets 186.0 10/16/2019: ALT 15; BUN 25; Creatinine, Ser 0.96; Potassium 4.1; Sodium 141; TSH 3.81   Recent Lipid Panel Lab Results  Component Value Date/Time   CHOL 173 10/16/2019 09:50 AM   TRIG 113.0 10/16/2019  09:50 AM   HDL 50.70 10/16/2019 09:50 AM   CHOLHDL 3 10/16/2019 09:50 AM   LDLCALC 100 (H) 10/16/2019 09:50 AM   LDLDIRECT 177.2 08/04/2013 11:47 AM    Wt Readings from Last 3 Encounters:  10/27/19 211 lb (95.7 kg)  10/16/19 207 lb 8 oz (94.1 kg)  04/06/19 205 lb (93 kg)     Objective:    Vital Signs:  BP 116/70   Pulse (!) 53   Ht 5\' 3"  (1.6 m)   Wt 211 lb (95.7 kg)   LMP  (LMP Unknown)   SpO2 98%   BMI 37.38 kg/m    Affect appropriate Healthy:  appears stated age HEENT: normal Neck supple with no adenopathy JVP normal no bruits no thyromegaly Lungs clear with no wheezing and good diaphragmatic motion Heart:  S1/S2 no murmur, no rub, gallop or click PMI normal Abdomen: benighn, BS positve, no tenderness, no AAA no bruit.  No HSM or HJR Distal pulses intact with no bruits No edema Neuro non-focal Skin warm and dry No muscular weakness   ASSESSMENT & PLAN:    1. PAF :  Stable NSR change to Toprol 25 mg daily instead of bid SA dosing  2. anticoagulation on xarelto.   Continue no bleeding issues  3. Hypothyroid on replacement followed by PCP TSH 3.8 11/202/20   COVID-19 Education: The signs and symptoms of COVID-19 were discussed with the patient and how to seek care for testing (follow up with PCP or arrange E-visit).  The importance of social distancing was discussed today.  Time:   Today, I have spent 30 minutes with patient     Medication Adjustments/Labs and Tests Ordered: Current medicines are reviewed at length with the patient today.  Concerns regarding medicines are outlined above.   Tests Ordered: No orders of the defined types were placed in this encounter.   Medication Changes: Meds ordered this encounter  Medications  . metoprolol succinate (TOPROL XL) 25 MG 24 hr tablet    Sig: Take 1 tablet (25 mg total) by mouth daily.    Dispense:  90 tablet    Refill:  3    Disposition:  Follow up in 6 month(s)  Signed, Jenkins Rouge, MD  10/27/2019 9:41 AM    [facility]

## 2019-10-16 NOTE — Assessment & Plan Note (Signed)
-   Td 2018  -: PNM 23 2008 and 2018  - Prevnar 2015 - zostavax 2012 - shingrix discussed 2019  - s/p Flu shot    -Female care:  per gyn  H/o breast ca, last MMG  11-2018 -[facility]S: Cscope @ [facility] 01-2008 : 2 polyps ----> tubular adenomas w/  low grade dysplasia Cscope 08/2010, Dr Henrene Pastor, very small polyps Last Cscope 08-2016 --Labs: CMP, FLP, A1c, TSH, vitamin D - diet exercise discussed

## 2019-10-16 NOTE — Progress Notes (Signed)
Subjective:    Patient ID: Lori Joseph, female    DOB: 1941/04/24, 78 y.o.   MRN: [id number]  DOS:  10/16/2019 Type of visit - description: CPX In general feeling well. Has no major concerns.  Wt Readings from Last 3 Encounters:  10/16/19 207 lb 8 oz (94.1 kg)  04/06/19 205 lb (93 kg)  02/23/19 208 lb (94.3 kg)     Review of Systems No new or unusual symptoms.  A 14 point review of systems is negative   Past Medical History:  Diagnosis Date  . Allergic rhinitis   . Anemia   . Atrial fibrillation ([place]) 03/2017  . Family history of anesthesia complication    daughter hard to wake up  . Glaucoma   . H/O acute pancreatitis   . H/O retinal detachment   . Hurthle cell neoplasm of thyroid    Right   . HYPERLIPIDEMIA   . Hypertension   . Hypothyroidism 05/31/2015  . OSTEOPENIA   . Personal history of radiation therapy 2015  . Primary cancer of upper outer quadrant of left female breast ([place])    Dr. Lindi Adie  . Radiation 11/11/15-12/30/14   left breast 50.4 gray, lumpectomy cavity boosted to 62.4 gray    Past Surgical History:  Procedure Laterality Date  . BREAST BIOPSY Left 08/27/14  . BREAST BIOPSY Left 11/27/2016  . BREAST LUMPECTOMY Left 09/15/2014  . CHOLECYSTECTOMY  2000  . DILATION AND CURETTAGE OF UTERUS    . laser eye surgery, detached retina Left   . RADIOACTIVE SEED GUIDED PARTIAL MASTECTOMY WITH AXILLARY SENTINEL LYMPH NODE BIOPSY Left 09/16/2014   Procedure: RADIOACTIVE SEED GUIDED PARTIAL MASTECTOMY WITH AXILLARY SENTINEL LYMPH NODE BIOPSY;  Surgeon: [name], MD;  Location: [facility];  Service: General;  Laterality: Left;  . thyoidectomy Left    partial  . THYROID LOBECTOMY N/A 02/10/2015   Procedure: RIGHT THYROID LOBECTOMY;  Surgeon: [name], MD;  Location: [facility];  Service: General;  Laterality: N/A;  . TONSILLECTOMY    . TUBAL LIGATION     Family History  Problem Relation Age of Onset  . Breast cancer Sister 56  . Cancer  Father        lung cancer ; smoker  . Cancer Brother        3 brothers with lung cancer, all smokers  . Cancer Paternal Uncle        2 pat uncles with lung cancer, smokers  . Kidney cancer Sister 95  . Melanoma Sister 62  . Ovarian cancer Other 19       niece with ovarian cancer (related through sister with breast cancer)  . Colon cancer Neg Hx   . CAD Neg Hx   . Esophageal cancer Neg Hx   . Stomach cancer Neg Hx   . Rectal cancer Neg Hx     Social History   Socioeconomic History  . Marital status: Married    Spouse name: Ruthann Cancer  . Number of children: 2  . Years of education: Not on file  . Highest education level: Not on file  Occupational History  . Occupation: RETIRED-- Retail banker: [employer]  Social Needs  . Financial resource strain: Not on file  . Food insecurity    Worry: Not on file    Inability: Not on file  . Transportation needs    Medical: Not on file    Non-medical: Not on file  Tobacco Use  .  Smoking status: Never Smoker  . Smokeless tobacco: Never Used  Substance and Sexual Activity  . Alcohol use: No    Alcohol/week: 0.0 standard drinks  . Drug use: No  . Sexual activity: Not on file  Lifestyle  . Physical activity    Days per week: Not on file    Minutes per session: Not on file  . Stress: Not on file  Relationships  . Social Herbalist on phone: Not on file    Gets together: Not on file    Attends religious service: Not on file    Active member of club or organization: Not on file    Attends meetings of clubs or organizations: Not on file    Relationship status: Not on file  . Intimate partner violence    Fear of current or ex partner: Not on file    Emotionally abused: Not on file    Physically abused: Not on file    Forced sexual activity: Not on file  Other Topics Concern  . Not on file  Social History Narrative   Lives w/ husband      Allergies as of 10/16/2019      Reactions   Codeine  Anaphylaxis   Hyaluronic Acid  [collagen-chond-hyaluronic Acid] Itching, Rash   Carvedilol    REACTION: leg pain, edema   Felodipine    REACTION: HA, insomnia   Lisinopril    REACTION: HA, SWELLING   Losartan Potassium    REACTION: cough   Radiaplexrx [pyridoxine-zinc Picolinate] Itching, Rash      Medication List       Accurate as of October 16, 2019 11:59 PM. If you have any questions, ask your nurse or doctor.        STOP taking these medications   Vitamin D (Ergocalciferol) 1.25 MG (50000 UT) Caps capsule Commonly known as: DRISDOL Stopped by: Kathlene November, MD     TAKE these medications   anastrozole 1 MG tablet Commonly known as: ARIMIDEX Take 1 tablet (1 mg total) by mouth daily.   calcium carbonate 1250 MG capsule Take 2,500 mg by mouth every morning.   cholecalciferol 1000 units tablet Commonly known as: VITAMIN D Take 2,500 Units by mouth every morning.   dorzolamide-timolol 22.3-6.8 MG/ML ophthalmic solution Commonly known as: COSOPT Place 1 drop into both eyes every morning.   hydrochlorothiazide 25 MG tablet Commonly known as: HYDRODIURIL Take 0.5 tablets (12.5 mg total) by mouth daily.   latanoprost 0.005 % ophthalmic solution Commonly known as: XALATAN PLACE 1 DROP INTO BOTH EYES NIGHTLY.   levothyroxine 25 MCG tablet Commonly known as: SYNTHROID Take 1 tablet (25 mcg total) by mouth daily before breakfast.   metoprolol tartrate 50 MG tablet Commonly known as: LOPRESSOR Take 1 tablet (50 mg total) by mouth 2 (two) times daily.   multivitamin tablet Take 1 tablet by mouth every morning.   pravastatin 40 MG tablet Commonly known as: PRAVACHOL Take 1 tablet (40 mg total) by mouth daily.   rivaroxaban 20 MG Tabs tablet Commonly known as: Xarelto Take 1 tablet (20 mg total) by mouth daily with supper.   telmisartan 40 MG tablet Commonly known as: MICARDIS Take 1 tablet (40 mg total) by mouth daily.           Objective:   Physical  Exam BP 138/76 (BP Location: Right Arm, Patient Position: Sitting, Cuff Size: Normal)   Pulse 89   Temp (!) 96.9 F (36.1 C) (Temporal)  Resp 16   Ht 5\' 3"  (1.6 m)   Wt 207 lb 8 oz (94.1 kg)   LMP  (LMP Unknown)   SpO2 100%   BMI 36.76 kg/m  General: Well developed, NAD, BMI noted Neck: No  thyromegaly  HEENT:  Normocephalic . Face symmetric, atraumatic Lungs:  CTA B Normal respiratory effort, no intercostal retractions, no accessory muscle use. Heart: Irregular,  no murmur.  No pretibial edema bilaterally  Abdomen:  Not distended, soft, non-tender. No rebound or rigidity.   Skin: Exposed areas without rash. Not pale. Not jaundice Neurologic:  alert & oriented X3.  Speech normal, gait appropriate for age and unassisted Strength symmetric and appropriate for age.  Psych: Cognition and judgment appear intact.  Cooperative with normal attention span and concentration.  Behavior appropriate. No anxious or depressed appearing.     Assessment      Assessment  Hyperglycemia (a1c= 6.0 11.2019) HTN Hyperlipidemia Hypothyroidism Osteopenia: T score 2010 normal,  T score 05-2012 (-1.2), T score 03/2018 -1.1; on calcium and vitamin D Morbid Obesity CV: -A. fib, new onset 03-2017 -Low risk stress test 04-2017 Oncology: --Breast cancer, left, dx  2015, s/p XRT 2016, on Arimidex --Hurthle Cell neoplasm of the thyroid, thyroidectomy 01-2015 SKIN: ---Eczema ---[facility] 2012  ANEMIA- saw hematology, rx IV iron 10-2015 Chronic right-sided chest pain:  x-rays CT abdomen and pelvis 2016 negative, saw pain mngmt, ortho ---> better with Lidoderm patch and a local injection in the back Glaucoma   H/o acute pancreatitis H/o retinal  detachment Pulmonary: Abnormal chest x-ray and CT chest: Last CT chest 01-2016: See report. CXR 03/27/2016: No acute. Pulmonary: f/u prn ++ FH Lung Ca (pt is not a smoker)  PLAN: Here for CPX Hyperglycemia: Doing great with diet, slightly frustrated because  has not lost weight, recommend to continue eating healthy it provides a # of other benefits, not just wt loss.  HTN: Ambulatory BPs 120/70 when check, on metoprolol, telmisartan.  Labs Hyperlipidemia: On Pravachol, labs Hypothyroidism: Check TSH Osteopenia: Last DEXA 03/2018, T score -1.1, just finished ergocalciferol, on OTC vitamin D.  Checking vitamin D levels Morbid obesity: Improving her diet, not able to lose much weight. A. fib: Anticoagulated.  No symptoms Oncology: Sees oncology regularly. BCC, FH melanoma, refer to dermatology for a check over RTC 6 months   This visit occurred during the SARS-CoV-2 public health emergency.  Safety protocols were in place, including screening questions prior to the visit, additional usage of staff PPE, and extensive cleaning of exam room while observing appropriate contact time as indicated for disinfecting solutions.

## 2019-10-16 NOTE — Progress Notes (Signed)
Pre visit review using our clinic review tool, if applicable. No additional management support is needed unless otherwise documented below in the visit note. 

## 2019-10-17 NOTE — Assessment & Plan Note (Signed)
Here for CPX Hyperglycemia: Doing great with diet, slightly frustrated because has not lost weight, recommend to continue eating healthy it provides a # of other benefits, not just wt loss.  HTN: Ambulatory BPs 120/70 when check, on metoprolol, telmisartan.  Labs Hyperlipidemia: On Pravachol, labs Hypothyroidism: Check TSH Osteopenia: Last DEXA 03/2018, T score -1.1, just finished ergocalciferol, on OTC vitamin D.  Checking vitamin D levels Morbid obesity: Improving her diet, not able to lose much weight. A. fib: Anticoagulated.  No symptoms Oncology: Sees oncology regularly. BCC, FH melanoma, refer to dermatology for a check over RTC 6 months

## 2019-10-19 MED ORDER — VITAMIN D (ERGOCALCIFEROL) 1.25 MG (50000 UNIT) PO CAPS: 50000.0000 [IU] | ORAL_CAPSULE | ORAL | 0 refills | Status: DC

## 2019-10-19 NOTE — Addendum Note (Signed)
Addended byDamita Dunnings D on: 10/19/2019 04:51 PM   Modules accepted: Orders

## 2019-10-27 ENCOUNTER — Ambulatory Visit: Payer: Medicare Other | Admitting: Cardiovascular Disease

## 2019-10-27 ENCOUNTER — Encounter: Payer: Self-pay | Admitting: Cardiovascular Disease

## 2019-10-27 ENCOUNTER — Other Ambulatory Visit: Payer: Self-pay

## 2019-10-27 VITALS — BP 116/70 | HR 53 | Ht 63.0 in | Wt 211.0 lb

## 2019-10-27 DIAGNOSIS — I480 Paroxysmal atrial fibrillation: Secondary | ICD-10-CM

## 2019-10-27 MED ORDER — METOPROLOL SUCCINATE ER 25 MG PO TB24: 25.0000 mg | ORAL_TABLET | Freq: Every day | ORAL | 3 refills | Status: DC

## 2019-10-27 NOTE — Patient Instructions (Addendum)
Medication Instructions:  Your physician has recommended you make the following change in your medication:  1-STOP Metoprolol tartrate (Lopressor) 2-START Metoprolol succinate (Toprolol ) 25 mg by mouth daily  *If you need a refill on your cardiac medications before your next appointment, please call your pharmacy*  Lab Work:  If you have labs (blood work) drawn today and your tests are completely normal, you will receive your results only by: Marland Kitchen MyChart Message (if you have MyChart) OR . A paper copy in the mail If you have any lab test that is abnormal or we need to change your treatment, we will call you to review the results.  Testing/Procedures: None ordered today.   Follow-Up: At [facility], you and your health needs are our priority.  As part of our continuing mission to provide you with exceptional heart care, we have created designated Provider Care Teams.  These Care Teams include your primary Cardiologist (physician) and Advanced Practice Providers (APPs -  Physician Assistants and Nurse Practitioners) who all work together to provide you with the care you need, when you need it.  Your next appointment:   12 month(s)  The format for your next appointment:   In Person  Provider:   You may see Jenkins Rouge, MD or one of the following Advanced Practice Providers on your designated Care Team:    Truitt Merle, NP  Cecilie Kicks, NP  Kathyrn Drown, NP

## 2019-11-03 DIAGNOSIS — L821 Other seborrheic keratosis: Secondary | ICD-10-CM | POA: Diagnosis not present

## 2019-11-03 DIAGNOSIS — L814 Other melanin hyperpigmentation: Secondary | ICD-10-CM | POA: Diagnosis not present

## 2019-11-03 DIAGNOSIS — D1801 Hemangioma of skin and subcutaneous tissue: Secondary | ICD-10-CM | POA: Diagnosis not present

## 2019-11-03 DIAGNOSIS — D229 Melanocytic nevi, unspecified: Secondary | ICD-10-CM | POA: Diagnosis not present

## 2019-11-03 DIAGNOSIS — L820 Inflamed seborrheic keratosis: Secondary | ICD-10-CM | POA: Diagnosis not present

## 2019-11-12 ENCOUNTER — Other Ambulatory Visit: Payer: Self-pay | Admitting: Internal Medicine

## 2019-11-12 NOTE — Telephone Encounter (Signed)
Last OV 10/16/19 Last refill 05/28/19 #90/1 Next OV 04/14/20

## 2019-12-07 ENCOUNTER — Other Ambulatory Visit: Payer: Self-pay | Admitting: Internal Medicine

## 2019-12-07 DIAGNOSIS — Z853 Personal history of malignant neoplasm of breast: Secondary | ICD-10-CM

## 2019-12-18 ENCOUNTER — Other Ambulatory Visit: Payer: Self-pay | Admitting: Internal Medicine

## 2019-12-20 ENCOUNTER — Ambulatory Visit: Payer: Medicare Other | Attending: Internal Medicine

## 2019-12-20 DIAGNOSIS — Z23 Encounter for immunization: Secondary | ICD-10-CM | POA: Insufficient documentation

## 2019-12-20 NOTE — Progress Notes (Signed)
   Covid-19 Vaccination Clinic  Name:  Lori Joseph    MRN: [id number] DOB: August 30, 1941  12/20/2019  [name] was observed post Covid-19 immunization for 15 minutes without incidence. She was provided with Vaccine Information Sheet and instruction to access the V-Safe system.   [name] was instructed to call 911 with any severe reactions post vaccine: Marland Kitchen Difficulty breathing  . Swelling of your face and throat  . A fast heartbeat  . A bad rash all over your body  . Dizziness and weakness    Immunizations Administered    Name Date Dose VIS Date Route   Pfizer COVID-19 Vaccine 12/20/2019 12:51 PM 0.3 mL 11/06/2019 Intramuscular   Manufacturer: [place]   Lot: [id number]   [place]: [id number]

## 2019-12-23 ENCOUNTER — Other Ambulatory Visit: Payer: Self-pay

## 2019-12-23 ENCOUNTER — Other Ambulatory Visit: Payer: Self-pay | Admitting: Internal Medicine

## 2019-12-23 ENCOUNTER — Ambulatory Visit
Admission: RE | Admit: 2019-12-23 | Discharge: 2019-12-23 | Disposition: A | Discharge status: Home / Self Care | Payer: Medicare Other | Source: Ambulatory Visit | Attending: Internal Medicine | Admitting: Internal Medicine

## 2019-12-23 DIAGNOSIS — Z853 Personal history of malignant neoplasm of breast: Secondary | ICD-10-CM

## 2019-12-23 DIAGNOSIS — R928 Other abnormal and inconclusive findings on diagnostic imaging of breast: Secondary | ICD-10-CM | POA: Diagnosis not present

## 2019-12-23 NOTE — Telephone Encounter (Signed)
Okay refill 1 year

## 2019-12-23 NOTE — Telephone Encounter (Signed)
Is Pt on lifetime Xarelto? Okay to refill?

## 2019-12-29 ENCOUNTER — Telehealth: Payer: Self-pay | Admitting: Internal Medicine

## 2019-12-29 NOTE — Telephone Encounter (Signed)
Spoke with Lori Joseph regarding AWV. Patient declined to schedule at this time, but stated she will give the office a call when she is ready to schedule. SF

## 2019-12-31 ENCOUNTER — Other Ambulatory Visit: Payer: Self-pay | Admitting: Internal Medicine

## 2020-01-07 ENCOUNTER — Ambulatory Visit: Payer: Medicare Other

## 2020-01-10 ENCOUNTER — Other Ambulatory Visit: Payer: Self-pay | Admitting: Internal Medicine

## 2020-01-11 ENCOUNTER — Ambulatory Visit: Payer: Medicare Other | Attending: Internal Medicine

## 2020-01-11 DIAGNOSIS — Z23 Encounter for immunization: Secondary | ICD-10-CM

## 2020-01-11 NOTE — Progress Notes (Signed)
   Covid-19 Vaccination Clinic  Name:  Lori Joseph    MRN: [id number] DOB: 1941-01-09  01/11/2020  [name] was observed post Covid-19 immunization for 15 minutes without incidence. She was provided with Vaccine Information Sheet and instruction to access the V-Safe system.   [name] was instructed to call 911 with any severe reactions post vaccine: Marland Kitchen Difficulty breathing  . Swelling of your face and throat  . A fast heartbeat  . A bad rash all over your body  . Dizziness and weakness    Immunizations Administered    Name Date Dose VIS Date Route   Pfizer COVID-19 Vaccine 01/11/2020  8:48 AM 0.3 mL 11/06/2019 Intramuscular   Manufacturer: [place]   Lot: [id number]   [place]: [id number]

## 2020-01-14 ENCOUNTER — Inpatient Hospital Stay: Payer: Medicare Other | Admitting: Hematology and Oncology

## 2020-01-20 DIAGNOSIS — L820 Inflamed seborrheic keratosis: Secondary | ICD-10-CM | POA: Diagnosis not present

## 2020-01-20 DIAGNOSIS — L821 Other seborrheic keratosis: Secondary | ICD-10-CM | POA: Diagnosis not present

## 2020-01-24 NOTE — Progress Notes (Signed)
Patient Care Team: Colon Branch, MD as PCP - General Josue Hector, MD as PCP - Cardiology (Cardiology) [name], MD as Consulting Physician (Hematology and Oncology) [name], MD as Consulting Physician (General Surgery) Marylynn Pearson, MD as Consulting Physician (Obstetrics and Gynecology) Irene Shipper, MD as Consulting Physician (Gastroenterology) Gery Pray, MD as Consulting Physician (Radiation Oncology) Druscilla Brownie, MD as Consulting Physician (Dermatology)  DIAGNOSIS:    ICD-10-CM   1. 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 N0 M0 Oncotype DX recurrence score 15, 9% ROR   01/25/2015 -  Anti-estrogen oral therapy   Anastrozole 1 mg daily 5 years   02/10/2015 Surgery   Right thyroid lobectomy: Hurthle cell neoplasm 2.3 cm, adenomatous nodules     CHIEF COMPLIANT: Follow-up of breast cancer on anastrozole therapy  INTERVAL HISTORY: Lori Joseph is a 79 y.o. with above-mentioned history of breast cancer treated with lumpectomy and is currently on anastrozole therapy. Mammogram on 12/23/19 showed no evidence of malignancy bilaterally. She presents to the clinic alone today for annual follow-up.  She has tolerated anastrozole extremely well and had completed 5 years of therapy.  ALLERGIES:  is allergic to codeine; hyaluronic acid  [collagen-chond-hyaluronic acid]; carvedilol; felodipine; lisinopril; losartan potassium; and radiaplexrx [pyridoxine-zinc picolinate].  MEDICATIONS:  Current Outpatient Medications  Medication Sig Dispense Refill  . anastrozole (ARIMIDEX) 1 MG tablet Take 1  tablet (1 mg total) by mouth daily. 90 tablet 3  . calcium carbonate 1250 MG capsule Take 2,500 mg by mouth every morning.     . cholecalciferol (VITAMIN D) 1000 UNITS tablet Take 2,500 Units by mouth every morning.     . dorzolamide-timolol (COSOPT) 22.3-6.8 MG/ML ophthalmic solution Place 1 drop into both eyes every morning.     . hydrochlorothiazide (HYDRODIURIL) 25 MG tablet Take 0.5 tablets (12.5 mg total) by mouth daily. 45 tablet 1  . latanoprost (XALATAN) 0.005 % ophthalmic solution PLACE 1 DROP INTO BOTH EYES NIGHTLY.    Marland Kitchen levothyroxine (SYNTHROID) 25 MCG tablet Take 1 tablet (25 mcg total) by mouth daily before breakfast. 90 tablet 1  . metoprolol succinate (TOPROL XL) 25 MG 24 hr tablet Take 1 tablet (25 mg total) by mouth daily. 90 tablet 3  . Multiple Vitamin (MULTIVITAMIN) tablet Take 1 tablet by mouth every morning.     . pravastatin (PRAVACHOL) 40 MG tablet Take 1 tablet (40 mg total) by mouth daily. 90 tablet 1  . rivaroxaban (XARELTO) 20 MG TABS tablet Take 1 tablet (20 mg total) by mouth daily with supper. 90 tablet 3  . telmisartan (MICARDIS) 40 MG tablet TAKE 1 TABLET BY MOUTH EVERY DAY 90 tablet 1  . Vitamin D, Ergocalciferol, (DRISDOL) 1.25 MG (50000 UT) CAPS capsule Take 1 capsule (50,000 Units total) by mouth every 7 (seven) days. 12 capsule 0   No current facility-administered medications for this visit.    PHYSICAL EXAMINATION: ECOG PERFORMANCE STATUS: 1 - Symptomatic but completely ambulatory  Vitals:   01/25/20 1132  BP: (!) 142/91  Pulse: 87  Resp: 20  Temp: 98.2 F (36.8 C)  SpO2: 100%  Filed Weights   01/25/20 1132  Weight: 212 lb 6.4 oz (96.3 kg)    BREAST: No palpable masses or nodules in either right or left breasts. No palpable axillary supraclavicular or infraclavicular adenopathy no breast tenderness or nipple discharge. (exam performed in the presence of a chaperone)  LABORATORY DATA:  I have reviewed the data as listed CMP Latest Ref Rng  & Units 10/16/2019 02/16/2019 01/01/2019  Glucose 70 - 99 mg/dL 107(H) 120(H) 111(H)  BUN 6 - 23 mg/dL 25(H) 36(H) 29(H)  Creatinine 0.40 - 1.20 mg/dL 0.96 1.10 1.00  Sodium 135 - 145 mEq/L 141 141 143  Potassium 3.5 - 5.1 mEq/L 4.1 4.3 4.3  Chloride 96 - 112 mEq/L 104 104 105  CO2 19 - 32 mEq/L _0 Calcium 8.4 - 10.5 mg/dL 8.9 9.7 9.5  Total Protein 6.0 - 8.3 g/dL 5.8(L) - 6.4  Total Bilirubin 0.2 - 1.2 mg/dL 0.9 - 0.8  Alkaline Phos 39 - 117 U/L 87 - 103  AST 0 - 37 U/L 15 - 16  ALT 0 - 35 U/L 15 - 16    Lab Results  Component Value Date   WBC 5.7 02/16/2019   HGB 13.2 02/16/2019   HCT 40.3 02/16/2019   MCV 95.2 02/16/2019   PLT 186.0 02/16/2019   NEUTROABS 3.4 02/16/2019    ASSESSMENT & PLAN:  Breast cancer of upper-outer quadrant of left female breast Left breast lumpectomy 09/14/2014: 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 N0 M0 Oncotype DX recurrence score 15, 9% ROR Currently on anastrozole 1 mg daily started 01/25/2015.  Anastrozole toxicities: Patient denies any problems or concerns taking Anastrozole. She denies any hot flashes or myalgias. Breast cancer surveillance: 1. Breast exam3/11/2019 no palpable lumps or nodules.   2. Mammograms  12/23/2019:no evidence of malignancy, breast density category B   Atrial fibrillation: Currently on cardiac medications and Xarelto.  Patient completed 5 years of therapy and we will discontinue anastrozole at this time.  She tells me that she gets her gynecological examinations for breast exams and she will follow with the breast center. She could be seen on an as-needed basis. Because 2 of her sisters had breast cancer I will check with the genetic counselor about her genetic test results as well as if updated genetic testing will be beneficial. I will call her with this result.    No orders of the defined types were placed in this  encounter.  The patient has a good understanding of the overall plan. she agrees with it. she will call with any problems that may develop before the next visit here.  Total time spent: 20 mins including face to face time and time spent for planning, charting and coordination of care  [name], MD 01/25/2020  I, Cloyde Reams Dorshimer, am acting as scribe for Dr. [name].  I have reviewed the above documentation for accuracy and completeness, and I agree with the above.

## 2020-01-25 ENCOUNTER — Other Ambulatory Visit: Payer: Self-pay

## 2020-01-25 ENCOUNTER — Inpatient Hospital Stay: Payer: Medicare Other | Attending: Hematology and Oncology | Admitting: Hematology and Oncology

## 2020-01-25 DIAGNOSIS — Z79811 Long term (current) use of aromatase inhibitors: Secondary | ICD-10-CM | POA: Diagnosis not present

## 2020-01-25 DIAGNOSIS — Z7901 Long term (current) use of anticoagulants: Secondary | ICD-10-CM | POA: Diagnosis not present

## 2020-01-25 DIAGNOSIS — Z79899 Other long term (current) drug therapy: Secondary | ICD-10-CM | POA: Diagnosis not present

## 2020-01-25 DIAGNOSIS — C50412 Malignant neoplasm of upper-outer quadrant of left female breast: Secondary | ICD-10-CM

## 2020-01-25 DIAGNOSIS — I4891 Unspecified atrial fibrillation: Secondary | ICD-10-CM | POA: Diagnosis not present

## 2020-01-25 DIAGNOSIS — Z170 Estrogen receptor positive status [ER+]: Secondary | ICD-10-CM | POA: Insufficient documentation

## 2020-01-25 NOTE — Assessment & Plan Note (Signed)
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 N0 M0 Oncotype DX recurrence score 15, 9% ROR Currently on anastrozole 1 mg daily started 01/25/2015.  Anastrozole toxicities: Patient denies any problems or concerns taking Anastrozole. She denies any hot flashes or myalgias. Breast cancer surveillance: 1. Breast exam3/11/2019 no palpable lumps or nodules.   2. Mammograms  12/23/2019:no evidence of malignancy, breast density category B   Atrial fibrillation: Currently on cardiac medications and Xarelto.  Return to clinic in1 yrfor follow up

## 2020-02-09 DIAGNOSIS — Z779 Other contact with and (suspected) exposures hazardous to health: Secondary | ICD-10-CM | POA: Diagnosis not present

## 2020-04-14 ENCOUNTER — Telehealth: Payer: Medicare Other | Admitting: Internal Medicine

## 2020-05-02 ENCOUNTER — Other Ambulatory Visit: Payer: Self-pay

## 2020-05-02 ENCOUNTER — Ambulatory Visit (INDEPENDENT_AMBULATORY_CARE_PROVIDER_SITE_OTHER): Payer: Medicare Other | Admitting: Family Medicine

## 2020-05-02 ENCOUNTER — Encounter (INDEPENDENT_AMBULATORY_CARE_PROVIDER_SITE_OTHER): Payer: Self-pay | Admitting: Family Medicine

## 2020-05-02 VITALS — BP 115/83 | HR 70 | Temp 97.8°F | Ht 63.0 in | Wt 210.0 lb

## 2020-05-02 DIAGNOSIS — R0602 Shortness of breath: Secondary | ICD-10-CM

## 2020-05-02 DIAGNOSIS — Z6837 Body mass index (BMI) 37.0-37.9, adult: Secondary | ICD-10-CM | POA: Diagnosis not present

## 2020-05-02 DIAGNOSIS — E559 Vitamin D deficiency, unspecified: Secondary | ICD-10-CM

## 2020-05-02 DIAGNOSIS — E038 Other specified hypothyroidism: Secondary | ICD-10-CM | POA: Diagnosis not present

## 2020-05-02 DIAGNOSIS — R7303 Prediabetes: Secondary | ICD-10-CM

## 2020-05-02 DIAGNOSIS — R5383 Other fatigue: Secondary | ICD-10-CM

## 2020-05-02 DIAGNOSIS — Z1331 Encounter for screening for depression: Secondary | ICD-10-CM | POA: Diagnosis not present

## 2020-05-02 NOTE — Progress Notes (Signed)
Chief Complaint:   OBESITY Lori Joseph (MR# [id number]) is a 79 y.o. female who presents for evaluation and treatment of obesity and related comorbidities. Current BMI is Body mass index is 37.2 kg/m. Lori Joseph has been struggling with her weight for many years and has been unsuccessful in either losing weight, maintaining weight loss, or reaching her healthy weight goal.  Lori Joseph is currently in the action stage of change and ready to dedicate time achieving and maintaining a healthier weight. Lori Joseph is interested in becoming our patient and working on intensive lifestyle modifications including (but not limited to) diet and exercise for weight loss.  Lori Joseph is a returning patient.  She reports a weight gain of about 5 pounds per year.  She was previously on Category 1 +100 calories.  She is often skipping lunch.  Breakfast is 2 eggs, 45 calorie bread, grits (around 10 am).  At 4 pm, dinner will be salmon cakes with vegetables with green beans, garden peas, and corn.   Lori Joseph's habits were reviewed today and are as follows: Her family eats meals together, she thinks her family will eat healthier with her, her desired weight loss is 30 pounds, her heaviest weight ever was her current weight, she craves pasta, she skips lunch frequently, she frequently makes poor food choices and she frequently eats larger portions than normal.  Depression Screen Lori Joseph's Food and Mood (modified PHQ-9) score was 12.  Depression screen PHQ 2/9 05/02/2020  Decreased Interest 3  Down, Depressed, Hopeless 1  PHQ - 2 Score 4  Altered sleeping 1  Tired, decreased energy 3  Change in appetite 2  Feeling bad or failure about yourself  0  Trouble concentrating 2  Moving slowly or fidgety/restless 0  Suicidal thoughts 0  PHQ-9 Score 12  Difficult doing work/chores Not difficult at all  Some recent data might be hidden   Subjective:   1. Other fatigue Lori Joseph denies daytime somnolence and denies waking up still tired.  Tensley generally gets 6 hours of sleep per night, and states that she has poor quality sleep. Snoring is not present. Apneic episodes are not present. Epworth Sleepiness Score is 2.  2. SOB (shortness of breath) on exertion Lori Joseph notes increasing shortness of breath with exercising and seems to be worsening over time with weight gain. She notes getting out of breath sooner with activity than she used to. This has gotten worse recently. Lori Joseph denies shortness of breath at rest or orthopnea.  3. Prediabetes Lori Joseph has a diagnosis of prediabetes based on her elevated HgA1c and was informed this puts her at greater risk of developing diabetes. She continues to work on diet and exercise to decrease her risk of diabetes. She denies nausea or hypoglycemia.  She is not on metformin.  Lab Results  Component Value Date   HGBA1C 5.9 10/16/2019   Lab Results  Component Value Date   INSULIN 15.9 01/01/2019   4. Vitamin D deficiency Lori Joseph's Vitamin D level was 28.5 on 10/16/2019. She is currently taking OTC vitamin D 2500 IU each day. She denies nausea, vomiting or muscle weakness.  She endorses fatigue.  5. Other specified hypothyroidism Lori Joseph takes levothyroxine 25 mcg daily.  No cold/heat intolerance or palpitations.  Lab Results  Component Value Date   TSH 3.81 10/16/2019   6. Depression screening Lori Joseph was screened for depression as part of her new patient workup.  Assessment/Plan:   1. Other fatigue Lori Joseph does feel that her weight is causing her  energy to be lower than it should be. Fatigue may be related to obesity, depression or many other causes. Labs will be ordered, and in the meanwhile, Lori Joseph will focus on self care including making healthy food choices, increasing physical activity and focusing on stress reduction. - EKG 12-Lead - Comprehensive metabolic panel - CBC with Differential/Platelet - Lipid Panel With LDL/HDL Ratio - VITAMIN D 25 Hydroxy (Vit-D Deficiency, Fractures)  2.  SOB (shortness of breath) on exertion Lori Joseph does feel that she gets out of breath more easily that she used to when she exercises. Lori Joseph's shortness of breath appears to be obesity related and exercise induced. She has agreed to work on weight loss and gradually increase exercise to treat her exercise induced shortness of breath. Will continue to monitor closely. - Comprehensive metabolic panel - CBC with Differential/Platelet - Lipid Panel With LDL/HDL Ratio - VITAMIN D 25 Hydroxy (Vit-D Deficiency, Fractures)  3. Prediabetes Lori Joseph will continue to work on weight loss, exercise, and decreasing simple carbohydrates to help decrease the risk of diabetes.  - Comprehensive metabolic panel - Hemoglobin A1c - Insulin, random  4. Vitamin D deficiency Low Vitamin D level contributes to fatigue and are associated with obesity, breast, and colon cancer. She agrees to continue to take prescription Vitamin D @50 ,000 IU every week and will follow-up for routine testing of Vitamin D, at least 2-3 times per year to avoid over-replacement.  5. Other specified hypothyroidism Patient with long-standing hypothyroidism, on levothyroxine therapy. She appears euthyroid. Orders and follow up as documented in patient record.  Counseling . Good thyroid control is important for overall health. Supratherapeutic thyroid levels are dangerous and will not improve weight loss results. . The correct way to take levothyroxine is fasting, with water, separated by at least 30 minutes from breakfast, and separated by more than 4 hours from calcium, iron, multivitamins, acid reflux medications (PPIs).  - T3 - T4 - TSH  6. Depression screening Lori Joseph had a positive depression screening. Depression is commonly associated with obesity and often results in emotional eating behaviors. We will monitor this closely and work on CBT to help improve the non-hunger eating patterns. Referral to Psychology may be required if no  improvement is seen as she continues in our clinic.  7. Class 2 severe obesity with serious comorbidity and body mass index (BMI) of 37.0 to 37.9 in adult, unspecified obesity type Lori Joseph) Lori Joseph is currently in the action stage of change and her goal is to continue with weight loss efforts. I recommend Lori Joseph begin the structured treatment plan as follows:  She has agreed to the Category 2 Plan.  Exercise goals: No exercise has been prescribed at this time.   Behavioral modification strategies: increasing lean protein intake, increasing vegetables, meal planning and cooking strategies, keeping healthy foods in the home and planning for success.  She was informed of the importance of frequent follow-up visits to maximize her success with intensive lifestyle modifications for her multiple health conditions. She was informed we would discuss her lab results at her next visit unless there is a critical issue that needs to be addressed sooner. Lori Joseph agreed to keep her next visit at the agreed upon time to discuss these results.  Objective:   Blood pressure 115/83, pulse 70, temperature 97.8 F (36.6 C), temperature source Oral, height 5\' 3"  (1.6 m), weight 210 lb (95.3 kg), SpO2 98 %. Body mass index is 37.2 kg/m.  EKG: Normal sinus rhythm, rate 75 bpm.  Indirect Calorimeter completed  today shows a VO2 of 199 and a REE of 1388.  Her calculated basal metabolic rate is 8891 thus her basal metabolic rate is worse than expected.  General: Cooperative, alert, well developed, in no acute distress. HEENT: Conjunctivae and lids unremarkable. Cardiovascular: Regular rhythm.  Lungs: Normal work of breathing. Neurologic: No focal deficits.   Lab Results  Component Value Date   CREATININE 0.96 10/16/2019   BUN 25 (H) 10/16/2019   NA 141 10/16/2019   K 4.1 10/16/2019   CL 104 10/16/2019   CO2 26 10/16/2019   Lab Results  Component Value Date   ALT 15 10/16/2019   AST 15 10/16/2019   ALKPHOS 87  10/16/2019   BILITOT 0.9 10/16/2019   Lab Results  Component Value Date   HGBA1C 5.9 10/16/2019   HGBA1C 6.0 10/09/2018   HGBA1C 5.8 04/25/2017   HGBA1C 5.7 08/24/2015   Lab Results  Component Value Date   INSULIN 15.9 01/01/2019   Lab Results  Component Value Date   TSH 3.81 10/16/2019   Lab Results  Component Value Date   CHOL 173 10/16/2019   HDL 50.70 10/16/2019   LDLCALC 100 (H) 10/16/2019   LDLDIRECT 177.2 08/04/2013   TRIG 113.0 10/16/2019   CHOLHDL 3 10/16/2019   Lab Results  Component Value Date   WBC 5.7 02/16/2019   HGB 13.2 02/16/2019   HCT 40.3 02/16/2019   MCV 95.2 02/16/2019   PLT 186.0 02/16/2019   Lab Results  Component Value Date   IRON 30 (L) 11/10/2015   TIBC 265 11/10/2015   FERRITIN 154 11/10/2015   Obesity Behavioral Intervention Visit Documentation for Insurance:   Approximately 15 minutes were spent on the discussion below.  ASK: We discussed the diagnosis of obesity with Lori Joseph today and Merna agreed to give [name] permission to discuss obesity behavioral modification therapy today.  ASSESS: Sarina has the diagnosis of obesity and her BMI today is 37.2. Shwanda is in the action stage of change.   ADVISE: Teriyah was educated on the multiple health risks of obesity as well as the benefit of weight loss to improve her health. She was advised of the need for long term treatment and the importance of lifestyle modifications to improve her current health and to decrease her risk of future health problems.  AGREE: Multiple dietary modification options and treatment options were discussed and Charrie agreed to follow the recommendations documented in the above note.  ARRANGE: Anhelica was educated on the importance of frequent visits to treat obesity as outlined per CMS and USPSTF guidelines and agreed to schedule her next follow up appointment today.  Attestation Statements:   Reviewed by clinician on day of visit: allergies, medications, problem  list, medical history, surgical history, family history, social history, and previous encounter notes.  I, Water quality scientist, CMA, am acting as transcriptionist for Coralie Common, MD.  I have reviewed the above documentation for accuracy and completeness, and I agree with the above. - Jinny Blossom, MD

## 2020-05-03 LAB — COMPREHENSIVE METABOLIC PANEL WITH GFR
ALT: 10 IU/L (ref 0–32)
AST: 17 IU/L (ref 0–40)
Albumin/Globulin Ratio: 2 (ref 1.2–2.2)
Albumin: 4.2 g/dL (ref 3.7–4.7)
Alkaline Phosphatase: 102 IU/L (ref 48–121)
BUN/Creatinine Ratio: 30 — ABNORMAL HIGH (ref 12–28)
BUN: 25 mg/dL (ref 8–27)
Bilirubin Total: 0.7 mg/dL (ref 0.0–1.2)
CO2: 25 mmol/L (ref 20–29)
Calcium: 9.3 mg/dL (ref 8.7–10.3)
Chloride: 104 mmol/L (ref 96–106)
Creatinine, Ser: 0.83 mg/dL (ref 0.57–1.00)
GFR calc Af Amer: 78 mL/min/1.73
GFR calc non Af Amer: 68 mL/min/1.73
Globulin, Total: 2.1 g/dL (ref 1.5–4.5)
Glucose: 107 mg/dL — ABNORMAL HIGH (ref 65–99)
Potassium: 4.6 mmol/L (ref 3.5–5.2)
Sodium: 141 mmol/L (ref 134–144)
Total Protein: 6.3 g/dL (ref 6.0–8.5)

## 2020-05-03 LAB — CBC WITH DIFFERENTIAL/PLATELET
Basophils Absolute: 0.1 x10E3/uL (ref 0.0–0.2)
Basos: 1 %
EOS (ABSOLUTE): 0.2 x10E3/uL (ref 0.0–0.4)
Eos: 4 %
Hematocrit: 38.1 % (ref 34.0–46.6)
Hemoglobin: 12.1 g/dL (ref 11.1–15.9)
Immature Grans (Abs): 0 x10E3/uL (ref 0.0–0.1)
Immature Granulocytes: 0 %
Lymphocytes Absolute: 1.4 x10E3/uL (ref 0.7–3.1)
Lymphs: 30 %
MCH: 29.8 pg (ref 26.6–33.0)
MCHC: 31.8 g/dL (ref 31.5–35.7)
MCV: 94 fL (ref 79–97)
Monocytes Absolute: 0.4 x10E3/uL (ref 0.1–0.9)
Monocytes: 8 %
Neutrophils Absolute: 2.7 x10E3/uL (ref 1.4–7.0)
Neutrophils: 57 %
Platelets: 177 x10E3/uL (ref 150–450)
RBC: 4.06 x10E6/uL (ref 3.77–5.28)
RDW: 12.6 % (ref 11.7–15.4)
WBC: 4.8 x10E3/uL (ref 3.4–10.8)

## 2020-05-03 LAB — TSH: TSH: 2.3 u[IU]/mL (ref 0.450–4.500)

## 2020-05-03 LAB — LIPID PANEL WITH LDL/HDL RATIO
Cholesterol, Total: 173 mg/dL (ref 100–199)
HDL: 54 mg/dL (ref >39)
LDL Chol Calc (NIH): 96 mg/dL (ref 0–99)
LDL/HDL Ratio: 1.8 ratio (ref 0.0–3.2)
Triglycerides: 128 mg/dL (ref 0–149)
VLDL Cholesterol Cal: 23 mg/dL (ref 5–40)

## 2020-05-03 LAB — T3: T3, Total: 107 ng/dL (ref 71–180)

## 2020-05-03 LAB — INSULIN, RANDOM: INSULIN: 10.5 u[IU]/mL (ref 2.6–24.9)

## 2020-05-03 LAB — HEMOGLOBIN A1C
Est. average glucose Bld gHb Est-mCnc: 120 mg/dL
Hgb A1c MFr Bld: 5.8 % — ABNORMAL HIGH (ref 4.8–5.6)

## 2020-05-03 LAB — T4: T4, Total: 8.5 ug/dL (ref 4.5–12.0)

## 2020-05-03 LAB — VITAMIN D 25 HYDROXY (VIT D DEFICIENCY, FRACTURES): Vit D, 25-Hydroxy: 37.2 ng/mL (ref 30.0–100.0)

## 2020-05-16 ENCOUNTER — Other Ambulatory Visit: Payer: Self-pay

## 2020-05-16 ENCOUNTER — Encounter (INDEPENDENT_AMBULATORY_CARE_PROVIDER_SITE_OTHER): Payer: Self-pay | Admitting: Family Medicine

## 2020-05-16 ENCOUNTER — Ambulatory Visit (INDEPENDENT_AMBULATORY_CARE_PROVIDER_SITE_OTHER): Payer: Medicare Other | Admitting: Family Medicine

## 2020-05-16 VITALS — BP 99/67 | HR 81 | Temp 97.8°F | Ht 63.0 in | Wt 203.0 lb

## 2020-05-16 DIAGNOSIS — R7303 Prediabetes: Secondary | ICD-10-CM

## 2020-05-16 DIAGNOSIS — Z6836 Body mass index (BMI) 36.0-36.9, adult: Secondary | ICD-10-CM

## 2020-05-16 DIAGNOSIS — E559 Vitamin D deficiency, unspecified: Secondary | ICD-10-CM | POA: Diagnosis not present

## 2020-05-16 DIAGNOSIS — I10 Essential (primary) hypertension: Secondary | ICD-10-CM | POA: Diagnosis not present

## 2020-05-16 MED ORDER — VITAMIN D (ERGOCALCIFEROL) 1.25 MG (50000 UNIT) PO CAPS: 50000.0000 [IU] | ORAL_CAPSULE | ORAL | 0 refills | Status: DC

## 2020-05-17 NOTE — Progress Notes (Signed)
Chief Complaint:   OBESITY Lori Joseph is here to discuss her progress with her obesity treatment plan along with follow-up of her obesity related diagnoses. Lori Joseph is on the Category 2 Plan and states she is following her eating plan approximately 90% of the time. Lori Joseph states she is doing 0 minutes 0 times per week.  Today's visit was #: 2 Starting weight: 210 lbs Starting date: 05/02/2020 Today's weight: 203 lbs Today's date: 05/16/2020 Total lbs lost to date: 7 Total lbs lost since last in-office visit: 7  Interim History: Lori Joseph states that she doesn't think she's eaten enough protein in the last few weeks. She does report that she has been increasing vegetable consumption, but not necessarily paying attention to the protein content. She hasn't been eating exactly what her husband eats. She is eating more eggs and doesn't eat fish.  Subjective:   1. Vitamin D deficiency Lori Joseph is not on prescription Vit D, she only takes 2,500 IU daily OTC Vit D. She notes fatigue. I discussed labs with the patient today.  2. Pre-diabetes Lori Joseph's A1c is 5.8 and she is not on metformin. She notes carbohydrate cravings. I discussed labs with the patient today.   3. Essential hypertension Lori Joseph's blood pressure is low today on micardis, HCTZ, and Toprol. She denies chest pain, chest pressure, or headaches, but she has had some dizziness and lightheadedness. She reports feeling off some time but she did not call Dr. Larose Kells.   Assessment/Plan:   1. Vitamin D deficiency Low Vitamin D level contributes to fatigue and are associated with obesity, breast, and colon cancer. Lori Joseph agreed to start prescription Vitamin D 50,000 IU every week with no refills. She will follow-up for routine testing of Vitamin D, at least 2-3 times per year to avoid over-replacement.  - Vitamin D, Ergocalciferol, (DRISDOL) 1.25 MG (50000 UNIT) CAPS capsule; Take 1 capsule (50,000 Units total) by mouth every 7 (seven) days.  Dispense: 4  capsule; Refill: 0  2. Pre-diabetes Lori Joseph will continue to work on weight loss, exercise, and decreasing simple carbohydrates to help decrease the risk of diabetes. We will follow up at her next appointment, and will repeat labs in 3 months.   3. Essential hypertension Lori Joseph is working on healthy weight loss and exercise to improve blood pressure control. We will watch for signs of hypotension as she continues her lifestyle modifications. Lori Joseph agreed to decrease telmisartan to 20 mg (she is to cut 40 mg tablet in half).  4. Class 2 severe obesity with serious comorbidity and body mass index (BMI) of 36.0 to 36.9 in adult, unspecified obesity type Lori Joseph) Lori Joseph is currently in the action stage of change. As such, her goal is to continue with weight loss efforts. She has agreed to the Category 2 Plan.   Exercise goals: No exercise has been prescribed at this time.  Behavioral modification strategies: increasing lean protein intake, increasing vegetables, meal planning and cooking strategies and keeping healthy foods in the home.  Lori Joseph has agreed to follow-up with our clinic in 2 weeks. She was informed of the importance of frequent follow-up visits to maximize her success with intensive lifestyle modifications for her multiple health conditions.   Objective:   Blood pressure 99/67, pulse 81, temperature 97.8 F (36.6 C), temperature source Oral, height 5\' 3"  (1.6 m), weight 203 lb (92.1 kg), SpO2 99 %. Body mass index is 35.96 kg/m.  General: Cooperative, alert, well developed, in no acute distress. HEENT: Conjunctivae and lids unremarkable. Cardiovascular:  Regular rhythm.  Lungs: Normal work of breathing. Neurologic: No focal deficits.   Lab Results  Component Value Date   CREATININE 0.83 05/02/2020   BUN 25 05/02/2020   NA 141 05/02/2020   K 4.6 05/02/2020   CL 104 05/02/2020   CO2 25 05/02/2020   Lab Results  Component Value Date   ALT 10 05/02/2020   AST 17 05/02/2020    ALKPHOS 102 05/02/2020   BILITOT 0.7 05/02/2020   Lab Results  Component Value Date   HGBA1C 5.8 (H) 05/02/2020   HGBA1C 5.9 10/16/2019   HGBA1C 6.0 10/09/2018   HGBA1C 5.8 04/25/2017   HGBA1C 5.7 08/24/2015   Lab Results  Component Value Date   INSULIN 10.5 05/02/2020   INSULIN 15.9 01/01/2019   Lab Results  Component Value Date   TSH 2.300 05/02/2020   Lab Results  Component Value Date   CHOL 173 05/02/2020   HDL 54 05/02/2020   LDLCALC 96 05/02/2020   LDLDIRECT 177.2 08/04/2013   TRIG 128 05/02/2020   CHOLHDL 3 10/16/2019   Lab Results  Component Value Date   WBC 4.8 05/02/2020   HGB 12.1 05/02/2020   HCT 38.1 05/02/2020   MCV 94 05/02/2020   PLT 177 05/02/2020   Lab Results  Component Value Date   IRON 30 (L) 11/10/2015   TIBC 265 11/10/2015   FERRITIN 154 11/10/2015    Obesity Behavioral Intervention Documentation for Insurance:   Approximately 15 minutes were spent on the discussion below.  ASK: We discussed the diagnosis of obesity with Lori Joseph today and Lori Joseph agreed to give [name] permission to discuss obesity behavioral modification therapy today.  ASSESS: Lori Joseph has the diagnosis of obesity and her BMI today is 35.97. Lori Joseph is in the action stage of change.   ADVISE: Lori Joseph was educated on the multiple health risks of obesity as well as the benefit of weight loss to improve her health. She was advised of the need for long term treatment and the importance of lifestyle modifications to improve her current health and to decrease her risk of future health problems.  AGREE: Multiple dietary modification options and treatment options were discussed and Lori Joseph agreed to follow the recommendations documented in the above note.  ARRANGE: Lori Joseph was educated on the importance of frequent visits to treat obesity as outlined per CMS and USPSTF guidelines and agreed to schedule her next follow up appointment today.  Attestation Statements:   Reviewed by clinician  on day of visit: allergies, medications, problem list, medical history, surgical history, family history, social history, and previous encounter notes.   I, Trixie Dredge, am acting as transcriptionist for Coralie Common, MD. I have reviewed the above documentation for accuracy and completeness, and I agree with the above. - Jinny Blossom, MD

## 2020-05-19 DIAGNOSIS — M25562 Pain in left knee: Secondary | ICD-10-CM | POA: Diagnosis not present

## 2020-05-26 ENCOUNTER — Encounter (INDEPENDENT_AMBULATORY_CARE_PROVIDER_SITE_OTHER): Payer: Self-pay

## 2020-05-28 ENCOUNTER — Other Ambulatory Visit: Payer: Self-pay | Admitting: Internal Medicine

## 2020-05-31 ENCOUNTER — Encounter (INDEPENDENT_AMBULATORY_CARE_PROVIDER_SITE_OTHER): Payer: Self-pay | Admitting: Family Medicine

## 2020-05-31 ENCOUNTER — Ambulatory Visit (INDEPENDENT_AMBULATORY_CARE_PROVIDER_SITE_OTHER): Payer: Medicare Other | Admitting: Family Medicine

## 2020-05-31 ENCOUNTER — Other Ambulatory Visit: Payer: Self-pay

## 2020-05-31 VITALS — BP 114/75 | HR 67 | Temp 97.7°F | Ht 63.0 in | Wt 198.0 lb

## 2020-05-31 DIAGNOSIS — R7303 Prediabetes: Secondary | ICD-10-CM | POA: Diagnosis not present

## 2020-05-31 DIAGNOSIS — I10 Essential (primary) hypertension: Secondary | ICD-10-CM | POA: Diagnosis not present

## 2020-05-31 DIAGNOSIS — Z6835 Body mass index (BMI) 35.0-35.9, adult: Secondary | ICD-10-CM

## 2020-05-31 DIAGNOSIS — M25562 Pain in left knee: Secondary | ICD-10-CM | POA: Diagnosis not present

## 2020-06-02 NOTE — Progress Notes (Signed)
Chief Complaint:   OBESITY Lori Joseph is here to discuss her progress with her obesity treatment plan along with follow-up of her obesity related diagnoses. Lori Joseph is on the Category 2 Plan and states she is following her eating plan approximately 90% of the time. Lori Joseph states she is doing 0 minutes 0 times per week.  Today's visit was #: 3 Starting weight: 210 lbs Starting date: 05/02/2020 Today's weight: 198 lbs Today's date: 05/31/2020 Total lbs lost to date: 12 Total lbs lost since last in-office visit: 5  Interim History: Lori Joseph hurt her knee and got a cortisone injection, but this did not help. She was told that she will likely need an operation. She has been eating what her husband eats but not as much. She doesn't particularly like protein so she struggles getting it in.  Subjective:   1. Pre-diabetes Lori Joseph's last A1c was 5.8 and insulin 10.5. She is not on metformin.  2. Essential hypertension Lori Joseph's blood pressure is well controlled today. She denies chest pain, chest pressure, or headache. Her blood pressure medications were changed at her last appointment.  Assessment/Plan:   1. Pre-diabetes Lori Joseph will continue to work on weight loss, exercise, and decreasing simple carbohydrates to help decrease the risk of diabetes. We will repeat labs in 2 months.  2. Essential hypertension Lori Joseph is working on healthy weight loss and exercise to improve blood pressure control. We will watch for signs of hypotension as she continues her lifestyle modifications. Lori Joseph will continue her current medications, no change in dosage.  3. Class 2 severe obesity with serious comorbidity and body mass index (BMI) of 35.0 to 35.9 in adult, unspecified obesity type Lori Joseph) Lori Joseph is currently in the action stage of change. As such, her goal is to continue with weight loss efforts. She has agreed to the Category 2 Plan.   Exercise goals: No exercise has been prescribed at this time.  Behavioral  modification strategies: increasing lean protein intake, increasing vegetables, meal planning and cooking strategies, keeping healthy foods in the home and planning for success.  Lori Joseph has agreed to follow-up with our clinic in 2 weeks. She was informed of the importance of frequent follow-up visits to maximize her success with intensive lifestyle modifications for her multiple health conditions.   Objective:   Blood pressure 114/75, pulse 67, temperature 97.7 F (36.5 C), temperature source Oral, height 5\' 3"  (1.6 m), weight 198 lb (89.8 kg), SpO2 97 %. Body mass index is 35.07 kg/m.  General: Cooperative, alert, well developed, in no acute distress. HEENT: Conjunctivae and lids unremarkable. Cardiovascular: Regular rhythm.  Lungs: Normal work of breathing. Neurologic: No focal deficits.   Lab Results  Component Value Date   CREATININE 0.83 05/02/2020   BUN 25 05/02/2020   NA 141 05/02/2020   K 4.6 05/02/2020   CL 104 05/02/2020   CO2 25 05/02/2020   Lab Results  Component Value Date   ALT 10 05/02/2020   AST 17 05/02/2020   ALKPHOS 102 05/02/2020   BILITOT 0.7 05/02/2020   Lab Results  Component Value Date   HGBA1C 5.8 (H) 05/02/2020   HGBA1C 5.9 10/16/2019   HGBA1C 6.0 10/09/2018   HGBA1C 5.8 04/25/2017   HGBA1C 5.7 08/24/2015   Lab Results  Component Value Date   INSULIN 10.5 05/02/2020   INSULIN 15.9 01/01/2019   Lab Results  Component Value Date   TSH 2.300 05/02/2020   Lab Results  Component Value Date   CHOL 173 05/02/2020  HDL 54 05/02/2020   LDLCALC 96 05/02/2020   LDLDIRECT 177.2 08/04/2013   TRIG 128 05/02/2020   CHOLHDL 3 10/16/2019   Lab Results  Component Value Date   WBC 4.8 05/02/2020   HGB 12.1 05/02/2020   HCT 38.1 05/02/2020   MCV 94 05/02/2020   PLT 177 05/02/2020   Lab Results  Component Value Date   IRON 30 (L) 11/10/2015   TIBC 265 11/10/2015   FERRITIN 154 11/10/2015   Attestation Statements:   Reviewed by clinician  on day of visit: allergies, medications, problem list, medical history, surgical history, family history, social history, and previous encounter notes.  Time spent on visit including pre-visit chart review and post-visit care and charting was 15 minutes.    I, Trixie Dredge, am acting as transcriptionist for Coralie Common, MD.  I have reviewed the above documentation for accuracy and completeness, and I agree with the above. - Jinny Blossom, MD

## 2020-06-05 ENCOUNTER — Other Ambulatory Visit (INDEPENDENT_AMBULATORY_CARE_PROVIDER_SITE_OTHER): Payer: Self-pay | Admitting: Family Medicine

## 2020-06-05 DIAGNOSIS — E559 Vitamin D deficiency, unspecified: Secondary | ICD-10-CM

## 2020-06-06 ENCOUNTER — Telehealth: Payer: Self-pay | Admitting: Internal Medicine

## 2020-06-06 MED ORDER — ALPRAZOLAM 0.5 MG PO TABS: 0.5000 mg | ORAL_TABLET | Freq: Every day | ORAL | 0 refills | Status: DC | PRN

## 2020-06-06 NOTE — Telephone Encounter (Signed)
Advise patient, I sent Xanax, she can take 1 or 2 tablets 30 minutes before the procedure. She needs a driver to go back home because she will get sleepy. I sent #4 in case she needs for future MRI.

## 2020-06-06 NOTE — Telephone Encounter (Signed)
Patient is scheduled for an MRI on Saturday. She is requesting for a pill or 2 to be sent in to help her with Anxiety when she goes in for MRI. Please advise

## 2020-06-06 NOTE — Telephone Encounter (Signed)
Please advise 

## 2020-06-06 NOTE — Telephone Encounter (Signed)
Spoke w/ Pt- informed of recommendations. Pt verbalized understanding.  

## 2020-06-11 DIAGNOSIS — M25562 Pain in left knee: Secondary | ICD-10-CM | POA: Diagnosis not present

## 2020-06-14 ENCOUNTER — Other Ambulatory Visit: Payer: Self-pay

## 2020-06-14 ENCOUNTER — Encounter (INDEPENDENT_AMBULATORY_CARE_PROVIDER_SITE_OTHER): Payer: Self-pay | Admitting: Family Medicine

## 2020-06-14 ENCOUNTER — Ambulatory Visit (INDEPENDENT_AMBULATORY_CARE_PROVIDER_SITE_OTHER): Payer: Medicare Other | Admitting: Family Medicine

## 2020-06-14 VITALS — BP 92/63 | HR 61 | Temp 97.8°F | Ht 63.0 in | Wt 200.0 lb

## 2020-06-14 DIAGNOSIS — Z6835 Body mass index (BMI) 35.0-35.9, adult: Secondary | ICD-10-CM

## 2020-06-14 DIAGNOSIS — E559 Vitamin D deficiency, unspecified: Secondary | ICD-10-CM | POA: Diagnosis not present

## 2020-06-14 DIAGNOSIS — I10 Essential (primary) hypertension: Secondary | ICD-10-CM | POA: Diagnosis not present

## 2020-06-14 MED ORDER — VITAMIN D (ERGOCALCIFEROL) 1.25 MG (50000 UNIT) PO CAPS: 50000.0000 [IU] | ORAL_CAPSULE | ORAL | 0 refills | Status: DC

## 2020-06-15 ENCOUNTER — Encounter: Payer: Self-pay | Admitting: Orthopedic Surgery

## 2020-06-15 ENCOUNTER — Ambulatory Visit: Payer: Medicare Other | Admitting: Orthopedic Surgery

## 2020-06-15 VITALS — Ht 63.0 in | Wt 203.0 lb

## 2020-06-15 DIAGNOSIS — M84362A Stress fracture, left tibia, initial encounter for fracture: Secondary | ICD-10-CM | POA: Diagnosis not present

## 2020-06-15 MED ORDER — TRAMADOL HCL 50 MG PO TABS: 50.0000 mg | ORAL_TABLET | Freq: Two times a day (BID) | ORAL | 0 refills | Status: DC | PRN

## 2020-06-15 NOTE — Telephone Encounter (Signed)
Alternate 15 min heat then 15 min ice

## 2020-06-16 NOTE — Progress Notes (Signed)
Chief Complaint:   OBESITY Lori Joseph is here to discuss her progress with her obesity treatment plan along with follow-up of her obesity related diagnoses. Lori Joseph is on the Category 2 Plan and states she is following her eating plan approximately 70-80% of the time. Lori Joseph states she is doing 0 minutes 0 times per week.  Today's visit was #: 4 Starting weight: 210 lbs Starting date: 05/02/2020 Today's weight: 200 lbs Today's date: 06/14/2020 Total lbs lost to date: 10 Total lbs lost since last in-office visit: 0  Interim History: Lori Joseph is experiencing quite a bit of knee pain and so she has been taking a significant amount of Tylenol. She has done some indulgent eating but she realizes she is eating off the plan. She ate a cheese sandwich with minimal mayo and another time she had pea soup and crackers and dip. Her husband went shopping.  Subjective:   1. Vitamin D deficiency Lori Joseph is on prescription Vit D, and she denies nausea, vomiting, or muscle weakness. She notes fatigue.  2. Essential hypertension Lori Joseph's blood pressure is borderline low at 92/63. She notes occasional dizziness, and denies chest pain, chest pressure, or headaches.  Assessment/Plan:   1. Vitamin D deficiency Low Vitamin D level contributes to fatigue and are associated with obesity, breast, and colon cancer. We will refill prescription Vitamin D for 1 month. Lori Joseph will follow-up for routine testing of Vitamin D, at least 2-3 times per year to avoid over-replacement.  - Vitamin D, Ergocalciferol, (DRISDOL) 1.25 MG (50000 UNIT) CAPS capsule; Take 1 capsule (50,000 Units total) by mouth every 7 (seven) days.  Dispense: 4 capsule; Refill: 0  2. Essential hypertension Lori Joseph is working on healthy weight loss and exercise to improve blood pressure control. We will watch for signs of hypotension as she continues her lifestyle modifications.  3. Class 2 severe obesity with serious comorbidity and body mass index (BMI) of  35.0 to 35.9 in adult, unspecified obesity type [facility]) Lori Joseph is currently in the action stage of change. As such, her goal is to continue with weight loss efforts. She has agreed to the Category 2 Plan.   Exercise goals: No exercise has been prescribed at this time.  Behavioral modification strategies: increasing lean protein intake, increasing vegetables, meal planning and cooking strategies and keeping healthy foods in the home.  Lori Joseph has agreed to follow-up with our clinic in 2 weeks. She was informed of the importance of frequent follow-up visits to maximize her success with intensive lifestyle modifications for her multiple health conditions.   Objective:   Blood pressure 92/63, pulse 61, temperature 97.8 F (36.6 C), temperature source Oral, height 5\' 3"  (1.6 m), weight 200 lb (90.7 kg), SpO2 96 %. Body mass index is 35.43 kg/m.  General: Cooperative, alert, well developed, in no acute distress. HEENT: Conjunctivae and lids unremarkable. Cardiovascular: Regular rhythm.  Lungs: Normal work of breathing. Neurologic: No focal deficits.   Lab Results  Component Value Date   CREATININE 0.83 05/02/2020   BUN 25 05/02/2020   NA 141 05/02/2020   K 4.6 05/02/2020   CL 104 05/02/2020   CO2 25 05/02/2020   Lab Results  Component Value Date   ALT 10 05/02/2020   AST 17 05/02/2020   ALKPHOS 102 05/02/2020   BILITOT 0.7 05/02/2020   Lab Results  Component Value Date   HGBA1C 5.8 (H) 05/02/2020   HGBA1C 5.9 10/16/2019   HGBA1C 6.0 10/09/2018   HGBA1C 5.8 04/25/2017   HGBA1C  5.7 08/24/2015   Lab Results  Component Value Date   INSULIN 10.5 05/02/2020   INSULIN 15.9 01/01/2019   Lab Results  Component Value Date   TSH 2.300 05/02/2020   Lab Results  Component Value Date   CHOL 173 05/02/2020   HDL 54 05/02/2020   LDLCALC 96 05/02/2020   LDLDIRECT 177.2 08/04/2013   TRIG 128 05/02/2020   CHOLHDL 3 10/16/2019   Lab Results  Component Value Date   WBC 4.8  05/02/2020   HGB 12.1 05/02/2020   HCT 38.1 05/02/2020   MCV 94 05/02/2020   PLT 177 05/02/2020   Lab Results  Component Value Date   IRON 30 (L) 11/10/2015   TIBC 265 11/10/2015   FERRITIN 154 11/10/2015    Obesity Behavioral Intervention Documentation for Insurance:   Approximately 15 minutes were spent on the discussion below.  ASK: We discussed the diagnosis of obesity with Lori Joseph today and Lori Joseph agreed to give [name] permission to discuss obesity behavioral modification therapy today.  ASSESS: Lori Joseph has the diagnosis of obesity and her BMI today is 35.97. Lori Joseph is in the action stage of change.   ADVISE: Lori Joseph was educated on the multiple health risks of obesity as well as the benefit of weight loss to improve her health. She was advised of the need for long term treatment and the importance of lifestyle modifications to improve her current health and to decrease her risk of future health problems.  AGREE: Multiple dietary modification options and treatment options were discussed and Lori Joseph agreed to follow the recommendations documented in the above note.  ARRANGE: Lori Joseph was educated on the importance of frequent visits to treat obesity as outlined per CMS and USPSTF guidelines and agreed to schedule her next follow up appointment today.  Attestation Statements:   Reviewed by clinician on day of visit: allergies, medications, problem list, medical history, surgical history, family history, social history, and previous encounter notes.   I, Trixie Dredge, am acting as transcriptionist for Coralie Common, MD.  I have reviewed the above documentation for accuracy and completeness, and I agree with the above. - Jinny Blossom, MD

## 2020-06-18 ENCOUNTER — Encounter: Payer: Self-pay | Admitting: Orthopedic Surgery

## 2020-06-18 ENCOUNTER — Other Ambulatory Visit: Payer: Self-pay | Admitting: Internal Medicine

## 2020-06-18 NOTE — Progress Notes (Signed)
Office Visit Note   Patient: Lori Joseph           Date of Birth: 1941-08-30           MRN: [id number] Visit Date: 06/15/2020 Requested by: Colon Branch, [address] [place],  [place] [address] PCP: Colon Branch, MD  Subjective: Chief Complaint  Patient presents with   Left Knee - Pain    HPI: Lori Joseph is a patient with left knee pain.  Started several months ago.  Denies any history of injury.  Had a cortisone injection 05/19/2020 which did not help much.  She is working on weight loss at the weight clinic.  She has had an MRI scan done which is reviewed.  It does show a stress reaction of the tibia.  Vitamin D 37 in June.  Has difficulty with stairs.  Has not taken any time off yet in terms of nonloadbearing.              ROS: All systems reviewed are negative as they relate to the chief complaint within the history of present illness.  Patient denies  fevers or chills.   Assessment & Plan: Visit Diagnoses:  1. Stress fracture of left tibia, initial encounter     Plan: Impression is left knee degenerative medial meniscal tear not unexpected in a patient in her age group.  She also has significant stress reaction in the left medial tibial plateau which is likely the source of her pain.  Vitamin D level adequate.  She needs some nonweightbearing on that knee.  We will go with the knee scooter and tramadol for pain with 4-week return.  AP lateral left knee at that time to look for some callus formation.  Follow-Up Instructions: Return in about 4 weeks (around 07/13/2020).   Orders:  No orders of the defined types were placed in this encounter.  Meds ordered this encounter  Medications   traMADol (ULTRAM) 50 MG tablet    Sig: Take 1 tablet (50 mg total) by mouth every 12 (twelve) hours as needed.    Dispense:  35 tablet    Refill:  0      Procedures: No procedures performed   Clinical Data: No additional findings.  Objective: Vital Signs: Ht 5\' 3"  (1.6  m)    Wt 203 lb (92.1 kg)    LMP  (LMP Unknown)    BMI 35.96 kg/m   Physical Exam:   Constitutional: Patient appears well-developed HEENT:  Head: Normocephalic Eyes:EOM are normal Neck: Normal range of motion Cardiovascular: Normal rate Pulmonary/chest: Effort normal Neurologic: Patient is alert Skin: Skin is warm Psychiatric: Patient has normal mood and affect    Ortho Exam: Ortho exam demonstrates good range of motion of the left knee with trace effusion.  Medial greater than lateral joint line tenderness.  Full range of motion.  Collateral cruciate ligaments are stable.  No masses lymphadenopathy or skin changes noted in that left knee region.  Specialty Comments:  No specialty comments available.  Imaging: No results found.   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/2014   Chest pain 12/31/2014   Breast cancer of upper-outer quadrant of left female breast ([place]) 09/08/2014   Eczema 08/05/2014   Annual physical exam >>>>>>>>>>>>>>>>>>>>>>>> 06/18/2012   BCC (basal cell carcinoma of skin) 06/18/2012   OTHER&UNSPECIFIED DISEASES THE ORAL SOFT TISSUES 02/10/2011   HYPERLIPIDEMIA 01/12/2011   OSTEOPENIA 09/28/2009   COLONIC POLYPS, 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   Morbid obesity ([place]) 05/18/2009   Essential hypertension 10/15/2007   PANCREATITIS, HX OF 03/13/2007   Past Medical History:  Diagnosis Date   Allergic rhinitis      Anemia    Atrial fibrillation ([place]) 03/2017   Family history of anesthesia complication    daughter hard to wake up   Glaucoma    H/O acute pancreatitis    H/O retinal detachment    Hurthle cell neoplasm of thyroid    Right    HYPERLIPIDEMIA    Hypertension    Hypothyroidism 05/31/2015   OSTEOPENIA    Personal history of radiation therapy 2015   Primary cancer of upper outer quadrant of left female breast ([facility])    Dr. Lindi Adie   Radiation 11/11/15-12/30/14   left breast 50.4 gray, lumpectomy cavity boosted to 62.4 gray   Swelling of both lower extremities    Vitamin D deficiency     Family History  Problem Relation Age of Onset   Breast cancer Sister 77   Cancer Father        lung cancer ; smoker   Cancer Brother        3 brothers with lung cancer, all smokers   Cancer Paternal Uncle        2 pat uncles with lung cancer, smokers   Kidney cancer Sister 81   Breast cancer Sister    Melanoma Sister 71   Ovarian cancer Other 65       niece with ovarian cancer (related through sister with breast cancer)   Colon cancer Neg Hx    CAD Neg Hx    Esophageal cancer Neg Hx    Stomach cancer Neg Hx    Rectal cancer Neg Hx     Past Surgical History:  Procedure Laterality Date   BREAST BIOPSY Left 08/27/14   BREAST BIOPSY Left 11/27/2016   BREAST LUMPECTOMY Left 09/15/2014   BREAST LUMPECTOMY     CHOLECYSTECTOMY  2000   DILATION AND CURETTAGE OF UTERUS     laser eye surgery, detached retina Left    RADIOACTIVE SEED GUIDED PARTIAL MASTECTOMY WITH AXILLARY SENTINEL LYMPH NODE BIOPSY Left 09/16/2014   Procedure: RADIOACTIVE SEED GUIDED PARTIAL MASTECTOMY WITH AXILLARY SENTINEL LYMPH NODE BIOPSY;  Surgeon: [name], MD;  Location: [facility];  Service: General;  Laterality: Left;   thyoidectomy Left    partial   THYROID LOBECTOMY N/A 02/10/2015   Procedure: RIGHT THYROID LOBECTOMY;  Surgeon: [name], MD;  Location: [facility];   Service: General;  Laterality: N/A;   TONSILLECTOMY     TUBAL LIGATION     Social History   Occupational History   Occupation: RETIRED-- Retail banker: [employer]  Tobacco Use   Smoking status: Never Smoker   Smokeless tobacco: Never Used  Vaping Use   Vaping Use: Never used  Substance and Sexual Activity   Alcohol use: No    Alcohol/week: 0.0 standard drinks   Drug  use: No   Sexual activity: Not on file

## 2020-06-19 ENCOUNTER — Other Ambulatory Visit: Payer: Self-pay | Admitting: Internal Medicine

## 2020-06-22 ENCOUNTER — Other Ambulatory Visit: Payer: Self-pay | Admitting: Internal Medicine

## 2020-06-25 ENCOUNTER — Other Ambulatory Visit: Payer: Self-pay | Admitting: Internal Medicine

## 2020-07-05 ENCOUNTER — Other Ambulatory Visit: Payer: Self-pay

## 2020-07-05 ENCOUNTER — Ambulatory Visit (INDEPENDENT_AMBULATORY_CARE_PROVIDER_SITE_OTHER): Payer: Medicare Other | Admitting: Family Medicine

## 2020-07-05 ENCOUNTER — Encounter (INDEPENDENT_AMBULATORY_CARE_PROVIDER_SITE_OTHER): Payer: Self-pay | Admitting: Family Medicine

## 2020-07-05 VITALS — BP 114/73 | HR 85 | Temp 97.8°F | Ht 63.0 in | Wt 198.0 lb

## 2020-07-05 DIAGNOSIS — I10 Essential (primary) hypertension: Secondary | ICD-10-CM

## 2020-07-05 DIAGNOSIS — E559 Vitamin D deficiency, unspecified: Secondary | ICD-10-CM

## 2020-07-05 DIAGNOSIS — Z6835 Body mass index (BMI) 35.0-35.9, adult: Secondary | ICD-10-CM | POA: Diagnosis not present

## 2020-07-05 MED ORDER — VITAMIN D (ERGOCALCIFEROL) 1.25 MG (50000 UNIT) PO CAPS: 50000.0000 [IU] | ORAL_CAPSULE | ORAL | 0 refills | Status: DC

## 2020-07-06 ENCOUNTER — Telehealth (INDEPENDENT_AMBULATORY_CARE_PROVIDER_SITE_OTHER): Payer: Self-pay | Admitting: Family Medicine

## 2020-07-06 NOTE — Progress Notes (Signed)
Chief Complaint:   OBESITY Lori Joseph is here to discuss her progress with her obesity treatment plan along with follow-up of her obesity related diagnoses. Lori Joseph is on the Category 2 Plan and states she is following her eating plan approximately 80% of the time. Lori Joseph states she is doing 0 minutes 0 times per week.  Today's visit was #: 5 Starting weight: 210 lbs Starting date: 05/02/2020 Today's weight: 198 lbs Today's date: 07/05/2020 Total lbs lost to date: 12 Total lbs lost since last in-office visit: 2  Interim History: Lori Joseph had an MRI done between her last appointment and today, showing stress fracture of the left tibia and she is currently non-weight bearing. Her husband has been making all of the food. She doesn't particularly like the food her husband is making but she eats it anyway.  Subjective:   1. Vitamin D deficiency Lori Joseph denies nausea, vomiting, or muscle weakness, but she notes fatigue.   2. Essential hypertension Lori Joseph's blood pressure is well controlled, and she denies chest pain, chest pressure, or headaches. She does report fatigue and faintness. She is on Micardis, hydrochlorothiazide, and metoprolol.  Assessment/Plan:   1. Vitamin D deficiency Low Vitamin D level contributes to fatigue and are associated with obesity, breast, and colon cancer. We will refill prescription Vitamin D for 1 month. Lori Joseph will follow-up for routine testing of Vitamin D, at least 2-3 times per year to avoid over-replacement.  - Vitamin D, Ergocalciferol, (DRISDOL) 1.25 MG (50000 UNIT) CAPS capsule; Take 1 capsule (50,000 Units total) by mouth every 7 (seven) days.  Dispense: 4 capsule; Refill: 0  2. Essential hypertension Lori Joseph is working on healthy weight loss and exercise to improve blood pressure control. We will watch for signs of hypotension as she continues her lifestyle modifications. Lori Joseph will stop Micardis and we will follow up on her blood pressure at her next  appointment.  3. Class 2 severe obesity with serious comorbidity and body mass index (BMI) of 35.0 to 35.9 in adult, unspecified obesity type [facility]) Lori Joseph is currently in the action stage of change. As such, her goal is to continue with weight loss efforts. She has agreed to the Category 2 Plan.   Exercise goals: No exercise has been prescribed at this time.  Behavioral modification strategies: increasing lean protein intake, increasing vegetables, meal planning and cooking strategies and keeping healthy foods in the home.  Lori Joseph has agreed to follow-up with our clinic in 2 weeks. She was informed of the importance of frequent follow-up visits to maximize her success with intensive lifestyle modifications for her multiple health conditions.   Objective:   Blood pressure 114/73, pulse 85, temperature 97.8 F (36.6 C), temperature source Oral, height 5\' 3"  (1.6 m), weight 198 lb (89.8 kg), SpO2 98 %. Body mass index is 35.07 kg/m.  General: Cooperative, alert, well developed, in no acute distress. HEENT: Conjunctivae and lids unremarkable. Cardiovascular: Regular rhythm.  Lungs: Normal work of breathing. Neurologic: No focal deficits.   Lab Results  Component Value Date   CREATININE 0.83 05/02/2020   BUN 25 05/02/2020   NA 141 05/02/2020   K 4.6 05/02/2020   CL 104 05/02/2020   CO2 25 05/02/2020   Lab Results  Component Value Date   ALT 10 05/02/2020   AST 17 05/02/2020   ALKPHOS 102 05/02/2020   BILITOT 0.7 05/02/2020   Lab Results  Component Value Date   HGBA1C 5.8 (H) 05/02/2020   HGBA1C 5.9 10/16/2019   HGBA1C  6.0 10/09/2018   HGBA1C 5.8 04/25/2017   HGBA1C 5.7 08/24/2015   Lab Results  Component Value Date   INSULIN 10.5 05/02/2020   INSULIN 15.9 01/01/2019   Lab Results  Component Value Date   TSH 2.300 05/02/2020   Lab Results  Component Value Date   CHOL 173 05/02/2020   HDL 54 05/02/2020   LDLCALC 96 05/02/2020   LDLDIRECT 177.2 08/04/2013   TRIG  128 05/02/2020   CHOLHDL 3 10/16/2019   Lab Results  Component Value Date   WBC 4.8 05/02/2020   HGB 12.1 05/02/2020   HCT 38.1 05/02/2020   MCV 94 05/02/2020   PLT 177 05/02/2020   Lab Results  Component Value Date   IRON 30 (L) 11/10/2015   TIBC 265 11/10/2015   FERRITIN 154 11/10/2015    Obesity Behavioral Intervention Documentation for Insurance:   Approximately 15 minutes were spent on the discussion below.  ASK: We discussed the diagnosis of obesity with Lori Joseph today and Lori Joseph agreed to give [name] permission to discuss obesity behavioral modification therapy today.  ASSESS: Lori Joseph has the diagnosis of obesity and her BMI today is 35.08. Lori Joseph is in the action stage of change.   ADVISE: Lori Joseph was educated on the multiple health risks of obesity as well as the benefit of weight loss to improve her health. She was advised of the need for long term treatment and the importance of lifestyle modifications to improve her current health and to decrease her risk of future health problems.  AGREE: Multiple dietary modification options and treatment options were discussed and Lori Joseph agreed to follow the recommendations documented in the above note.  ARRANGE: Lori Joseph was educated on the importance of frequent visits to treat obesity as outlined per CMS and USPSTF guidelines and agreed to schedule her next follow up appointment today.  Attestation Statements:   Reviewed by clinician on day of visit: allergies, medications, problem list, medical history, surgical history, family history, social history, and previous encounter notes.   I, Trixie Dredge, am acting as transcriptionist for Coralie Common, MD.  I have reviewed the above documentation for accuracy and completeness, and I agree with the above. - Jinny Blossom, MD

## 2020-07-06 NOTE — Telephone Encounter (Signed)
Spoke with pt-CS

## 2020-07-11 ENCOUNTER — Other Ambulatory Visit: Payer: Self-pay | Admitting: Internal Medicine

## 2020-07-13 ENCOUNTER — Ambulatory Visit: Payer: Self-pay

## 2020-07-13 ENCOUNTER — Ambulatory Visit: Payer: Medicare Other | Admitting: Orthopedic Surgery

## 2020-07-13 DIAGNOSIS — M84362A Stress fracture, left tibia, initial encounter for fracture: Secondary | ICD-10-CM

## 2020-07-14 ENCOUNTER — Other Ambulatory Visit: Payer: Self-pay | Admitting: Internal Medicine

## 2020-07-16 ENCOUNTER — Encounter: Payer: Self-pay | Admitting: Orthopedic Surgery

## 2020-07-16 NOTE — Progress Notes (Signed)
Office Visit Note   Patient: Lori Joseph           Date of Birth: March 05, 1941           MRN: [id number] Visit Date: 07/13/2020 Requested by: Colon Branch, [address] [place],  [place] [address] PCP: Colon Branch, MD  Subjective: Chief Complaint  Patient presents with  . Left Knee - Follow-up    HPI: Lori Joseph is a 79 year old patient left knee stress reaction medial tibial plateau.  Currently having no pain.  She is feeling better.  She has been off the knee for 6 weeks.  Ambulating with a cane.  Here to discuss weightbearing status.  No medications for pain.              ROS: All systems reviewed are negative as they relate to the chief complaint within the history of present illness.  Patient denies  fevers or chills.   Assessment & Plan: Visit Diagnoses:  1. Stress fracture of left tibia, initial encounter     Plan: Impression is improvement in left knee symptoms with a period of nonweightbearing.  No effusion today.  I do want her to gradually increase her weightbearing over the next 2 weeks.  I think we will know if this is something that has healed or may require a period of partial weightbearing versus full weightbearing.  In general though after this amount of protected weightbearing status she should be ready to weight-bear.  Follow-up if she develops any recurrent symptoms.  Radiographs look good today.  Follow-Up Instructions: No follow-ups on file.   Orders:  Orders Placed This Encounter  Procedures  . XR Knee 1-2 Views Left   No orders of the defined types were placed in this encounter.     Procedures: No procedures performed   Clinical Data: No additional findings.  Objective: Vital Signs: LMP  (LMP Unknown)   Physical Exam:   Constitutional: Patient appears well-developed HEENT:  Head: Normocephalic Eyes:EOM are normal Neck: Normal range of motion Cardiovascular: Normal rate Pulmonary/chest: Effort normal Neurologic: Patient is  alert Skin: Skin is warm Psychiatric: Patient has normal mood and affect    Ortho Exam: Ortho exam demonstrates full active and passive range of motion of that left knee.  Collateral and cruciate ligaments are stable.  No real discrete tenderness of the lateral or medial tibial plateau to direct palpation.  No focal swelling in this area.  No effusion in the knee.  Specialty Comments:  No specialty comments available.  Imaging: No results found.   PMFS History: Patient Active Problem List   Diagnosis Date Noted  . Atrial fibrillation ([place]).Onset 03/2017 04/26/2017  . Pulmonary infiltrates on CXR 01/30/2016  . Upper airway cough syndrome 01/30/2016  . PCP NOTES >>>>>>>>>>>>>>>> 08/25/2015  . Hypothyroidism 05/31/2015  . Family history of malignant neoplasm of breast 03/11/2015  . Family history of malignant neoplasm of ovary 03/11/2015  . Breast cancer ([place]) 03/11/2015  . PCP comments--R Chest wall and flank pain 12/31/2014  . 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/2014  . Chest pain 12/31/2014  . Breast cancer of upper-outer quadrant of left female breast ([facility]) 09/08/2014  . Eczema 08/05/2014  . Annual physical exam >>>>>>>>>>>>>>>>>>>>>>>> 06/18/2012  . BCC (basal cell carcinoma of skin) 06/18/2012  . OTHER&UNSPECIFIED DISEASES THE ORAL SOFT TISSUES 02/10/2011  . HYPERLIPIDEMIA 01/12/2011  . OSTEOPENIA 09/28/2009  . COLONIC POLYPS,  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  . Morbid obesity ([place]) 05/18/2009  . Essential hypertension 10/15/2007  . PANCREATITIS, HX OF 03/13/2007   Past Medical History:  Diagnosis Date  . Allergic rhinitis   . Anemia   . Atrial fibrillation ([place]) 03/2017  . Family history of anesthesia complication    daughter hard to wake up  . Glaucoma   . H/O acute pancreatitis   . H/O retinal detachment   . Hurthle cell neoplasm of thyroid    Right   . HYPERLIPIDEMIA   . Hypertension   . Hypothyroidism  05/31/2015  . OSTEOPENIA   . Personal history of radiation therapy 2015  . Primary cancer of upper outer quadrant of left female breast ([facility])    Dr. Lindi Adie  . Radiation 11/11/15-12/30/14   left breast 50.4 gray, lumpectomy cavity boosted to 62.4 gray  . Swelling of both lower extremities   . Vitamin D deficiency     Family History  Problem Relation Age of Onset  . Breast cancer Sister 32  . Cancer Father        lung cancer ; smoker  . Cancer Brother        3 brothers with lung cancer, all smokers  . Cancer Paternal Uncle        2 pat uncles with lung cancer, smokers  . Kidney cancer Sister 48  . Breast cancer Sister   . Melanoma Sister 15  . Ovarian cancer Other 46       niece with ovarian cancer (related through sister with breast cancer)  . Colon cancer Neg Hx   . CAD Neg Hx   . Esophageal cancer Neg Hx   . Stomach cancer Neg Hx   . Rectal cancer Neg Hx     Past Surgical History:  Procedure Laterality Date  . BREAST BIOPSY Left 08/27/14  . BREAST BIOPSY Left 11/27/2016  . BREAST LUMPECTOMY Left 09/15/2014  . BREAST LUMPECTOMY    . CHOLECYSTECTOMY  2000  . DILATION AND CURETTAGE OF UTERUS    . laser eye surgery, detached retina Left   . RADIOACTIVE SEED GUIDED PARTIAL MASTECTOMY WITH AXILLARY SENTINEL LYMPH NODE BIOPSY Left 09/16/2014   Procedure: RADIOACTIVE SEED GUIDED PARTIAL MASTECTOMY WITH AXILLARY SENTINEL LYMPH NODE BIOPSY;  Surgeon: [name], MD;  Location: [facility];  Service: General;  Laterality: Left;  . thyoidectomy Left    partial  . THYROID LOBECTOMY N/A 02/10/2015   Procedure: RIGHT THYROID LOBECTOMY;  Surgeon: [name], MD;  Location: [facility];  Service: General;  Laterality: N/A;  . TONSILLECTOMY    . TUBAL LIGATION     Social History   Occupational History  . Occupation: RETIRED-- Retail banker: [employer]  Tobacco Use  . Smoking status: Never Smoker  . Smokeless tobacco: Never Used  Vaping Use  .  Vaping Use: Never used  Substance and Sexual Activity  . Alcohol use: No    Alcohol/week: 0.0 standard drinks  . Drug use: No  . Sexual activity: Not on file

## 2020-07-19 ENCOUNTER — Other Ambulatory Visit: Payer: Self-pay

## 2020-07-19 ENCOUNTER — Ambulatory Visit (INDEPENDENT_AMBULATORY_CARE_PROVIDER_SITE_OTHER): Payer: Medicare Other | Admitting: Internal Medicine

## 2020-07-19 ENCOUNTER — Encounter: Payer: Self-pay | Admitting: Internal Medicine

## 2020-07-19 VITALS — BP 138/76 | HR 92 | Temp 97.8°F | Resp 16 | Ht 63.0 in | Wt 201.4 lb

## 2020-07-19 DIAGNOSIS — R7303 Prediabetes: Secondary | ICD-10-CM | POA: Diagnosis not present

## 2020-07-19 DIAGNOSIS — I10 Essential (primary) hypertension: Secondary | ICD-10-CM

## 2020-07-19 DIAGNOSIS — E559 Vitamin D deficiency, unspecified: Secondary | ICD-10-CM

## 2020-07-19 MED ORDER — HYDROCHLOROTHIAZIDE 25 MG PO TABS: 12.5000 mg | ORAL_TABLET | Freq: Every day | ORAL | 1 refills | Status: DC

## 2020-07-19 MED ORDER — PRAVASTATIN SODIUM 40 MG PO TABS: 40.0000 mg | ORAL_TABLET | Freq: Every day | ORAL | 1 refills | Status: DC

## 2020-07-19 MED ORDER — LEVOTHYROXINE SODIUM 25 MCG PO TABS: 25.0000 ug | ORAL_TABLET | Freq: Every day | ORAL | 1 refills | Status: DC

## 2020-07-19 NOTE — Progress Notes (Signed)
Subjective:    Patient ID: Lori Joseph, female    DOB: 1941/07/08, 79 y.o.   MRN: [id number]  DOS:  07/19/2020 Type of visit - description: Follow-up Since the last office visit she is doing well. Recent labs are reviewed.  BP Readings from Last 3 Encounters:  07/19/20 138/76  07/05/20 114/73  06/14/20 92/63    Review of Systems Denies chest pain or difficulty breathing No lower extremity edema although she has noted a lump at  the right ankle.   Past Medical History:  Diagnosis Date  . Allergic rhinitis   . Anemia   . Atrial fibrillation ([place]) 03/2017  . Family history of anesthesia complication    daughter hard to wake up  . Glaucoma   . H/O acute pancreatitis   . H/O retinal detachment   . Hurthle cell neoplasm of thyroid    Right   . HYPERLIPIDEMIA   . Hypertension   . Hypothyroidism 05/31/2015  . OSTEOPENIA   . Personal history of radiation therapy 2015  . 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 Lindi Adie  . Radiation 11/11/15-12/30/14   left breast 50.4 gray, lumpectomy cavity boosted to 62.4 gray  . Swelling of both lower extremities   . Vitamin D deficiency     Past Surgical History:  Procedure Laterality Date  . BREAST BIOPSY Left 08/27/14  . BREAST BIOPSY Left 11/27/2016  . BREAST LUMPECTOMY Left 09/15/2014  . BREAST LUMPECTOMY    . CHOLECYSTECTOMY  2000  . DILATION AND CURETTAGE OF UTERUS    . laser eye surgery, detached retina Left   . RADIOACTIVE SEED GUIDED PARTIAL MASTECTOMY WITH AXILLARY SENTINEL LYMPH NODE BIOPSY Left 09/16/2014   Procedure: RADIOACTIVE SEED GUIDED PARTIAL MASTECTOMY WITH AXILLARY SENTINEL LYMPH NODE BIOPSY;  Surgeon: [name], MD;  Location: [facility];  Service: General;  Laterality: Left;  . thyoidectomy Left    partial  . THYROID LOBECTOMY N/A 02/10/2015   Procedure: RIGHT THYROID LOBECTOMY;  Surgeon: [name], MD;  Location: [facility];  Service: General;  Laterality: N/A;    . TONSILLECTOMY    . TUBAL LIGATION      Allergies as of 07/19/2020      Reactions   Codeine Anaphylaxis   Hyaluronic Acid  [collagen-chond-hyaluronic Acid] Itching, Rash   Carvedilol    REACTION: leg pain, edema   Felodipine    REACTION: HA, insomnia   Lisinopril    REACTION: HA, SWELLING   Losartan Potassium    REACTION: cough   Radiaplexrx [pyridoxine-zinc Picolinate] Itching, Rash      Medication List       Accurate as of July 19, 2020  9:11 PM. If you have any questions, ask your nurse or doctor.        STOP taking these medications   telmisartan 40 MG tablet Commonly known as: MICARDIS Stopped by: Kathlene November, MD     TAKE these medications   calcium carbonate 1250 MG capsule Take 2,500 mg by mouth every morning.   cholecalciferol 1000 units tablet Commonly known as: VITAMIN D Take 2,500 Units by mouth every morning.   dorzolamide-timolol 22.3-6.8 MG/ML ophthalmic solution Commonly known as: COSOPT Place 1 drop into both eyes every morning.   hydrochlorothiazide 25 MG tablet Commonly known as: HYDRODIURIL Take 0.5 tablets (12.5 mg total) by mouth daily.   latanoprost 0.005 % ophthalmic solution Commonly known as: XALATAN PLACE 1 DROP INTO BOTH EYES NIGHTLY.  levothyroxine 25 MCG tablet Commonly known as: SYNTHROID Take 1 tablet (25 mcg total) by mouth daily before breakfast.   Melatonin 10 MG Tabs Take by mouth.   metoprolol succinate 25 MG 24 hr tablet Commonly known as: Toprol XL Take 1 tablet (25 mg total) by mouth daily. What changed:   how much to take  additional instructions   multivitamin tablet Take 1 tablet by mouth every morning.   pravastatin 40 MG tablet Commonly known as: PRAVACHOL Take 1 tablet (40 mg total) by mouth daily.   rivaroxaban 20 MG Tabs tablet Commonly known as: Xarelto Take 1 tablet (20 mg total) by mouth daily with supper.   traMADol 50 MG tablet Commonly known as: ULTRAM Take 1 tablet (50 mg total) by  mouth every 12 (twelve) hours as needed.   Vitamin D (Ergocalciferol) 1.25 MG (50000 UNIT) Caps capsule Commonly known as: DRISDOL Take 1 capsule (50,000 Units total) by mouth every 7 (seven) days.          Objective:   Physical Exam Musculoskeletal:       Feet:    BP 138/76 (BP Location: Right Arm, Patient Position: Sitting, Cuff Size: Normal)   Pulse 92   Temp 97.8 F (36.6 C) (Oral)   Resp 16   Ht 5\' 3"  (1.6 m)   Wt 201 lb 6 oz (91.3 kg)   LMP  (LMP Unknown)   SpO2 98%   BMI 35.67 kg/m  General:   Well developed, NAD, BMI noted. HEENT:  Normocephalic . Face symmetric, atraumatic Lungs:  CTA B Normal respiratory effort, no intercostal retractions, no accessory muscle use. Heart: Irregularly irregular Lower extremities: no pretibial edema bilaterally.  See graphic Skin: Not pale. Not jaundice Neurologic:  alert & oriented X3.  Speech normal, gait appropriate for age and unassisted Psych--  Cognition and judgment appear intact.  Cooperative with normal attention span and concentration.  Behavior appropriate. No anxious or depressed appearing.      Assessment        Assessment  Hyperglycemia (a1c= 6.0 11.2019) HTN Hyperlipidemia Hypothyroidism Osteopenia: T score 2010 normal,  T score 05-2012 (-1.2), T score 03/2018 -1.1; on calcium and vitamin D Morbid Obesity CV: -A. fib, new onset 03-2017 -Low risk stress test 04-2017 Oncology: --Breast cancer, left, dx  2015, s/p XRT 2016, on Arimidex --Hurthle Cell neoplasm of the thyroid, thyroidectomy 01-2015 SKIN: ---Eczema ---[facility] 2012  ANEMIA- saw hematology, rx IV iron 10-2015 Chronic right-sided chest pain:  x-rays CT abdomen and pelvis 2016 negative, saw pain mngmt, ortho ---> better with Lidoderm patch and a local injection in the back Glaucoma   H/o acute pancreatitis H/o retinal  detachment Pulmonary: Abnormal chest x-ray and CT chest: Last CT chest 01-2016: See report. CXR 03/27/2016: No acute.  Pulmonary: f/u prn ++ FH Lung Ca (pt is not a smoker)  PLAN: Hyperglycemia: Working with the wellness clinic to improve lifestyle, last A1c 5.8 HTN: BPs were in the low side, telmisartan was d/c. Continue HCTZ 1/2 tab, metoprolol (25 mg or 50 mg? She will let me know).  Last BMP very good Hyperlipidemia: Last LDL 96, on Pravachol. Hypothyroidism: Continue Synthroid, last TSH satisfactory. Vitamin D deficiency: to finish ergocalciferol today, last vitamin D level improved, continue OTCs . Lump, right ankle: Suspect lipoma.  Observation. Recommend a flu shot this fall RTC as scheduled for November, CPX   This visit occurred during the SARS-CoV-2 public health emergency.  Safety protocols were in place, including screening questions prior  to the visit, additional usage of staff PPE, and extensive cleaning of exam room while observing appropriate contact time as indicated for disinfecting solutions.

## 2020-07-19 NOTE — Progress Notes (Signed)
Pre visit review using our clinic review tool, if applicable. No additional management support is needed unless otherwise documented below in the visit note. 

## 2020-07-19 NOTE — Assessment & Plan Note (Signed)
Hyperglycemia: Working with the wellness clinic to improve lifestyle, last A1c 5.8 HTN: BPs were in the low side, telmisartan was d/c. Continue HCTZ 1/2 tab, metoprolol (25 mg or 50 mg? She will let me know).  Last BMP very good Hyperlipidemia: Last LDL 96, on Pravachol. Hypothyroidism: Continue Synthroid, last TSH satisfactory. Vitamin D deficiency: to finish ergocalciferol today, last vitamin D level improved, continue OTCs . Lump, right ankle: Suspect lipoma.  Observation. Recommend a flu shot this fall RTC as scheduled for November, CPX

## 2020-07-20 ENCOUNTER — Encounter: Payer: Self-pay | Admitting: Internal Medicine

## 2020-07-21 ENCOUNTER — Other Ambulatory Visit: Payer: Self-pay

## 2020-07-21 ENCOUNTER — Other Ambulatory Visit: Payer: Self-pay | Admitting: Internal Medicine

## 2020-07-21 ENCOUNTER — Encounter (INDEPENDENT_AMBULATORY_CARE_PROVIDER_SITE_OTHER): Payer: Self-pay | Admitting: Family Medicine

## 2020-07-21 ENCOUNTER — Ambulatory Visit (INDEPENDENT_AMBULATORY_CARE_PROVIDER_SITE_OTHER): Payer: Medicare Other | Admitting: Family Medicine

## 2020-07-21 VITALS — BP 114/68 | HR 76 | Temp 98.1°F | Ht 63.0 in | Wt 200.0 lb

## 2020-07-21 DIAGNOSIS — Z6835 Body mass index (BMI) 35.0-35.9, adult: Secondary | ICD-10-CM | POA: Diagnosis not present

## 2020-07-21 DIAGNOSIS — K5909 Other constipation: Secondary | ICD-10-CM

## 2020-07-21 DIAGNOSIS — E559 Vitamin D deficiency, unspecified: Secondary | ICD-10-CM | POA: Diagnosis not present

## 2020-07-21 MED ORDER — VITAMIN D (ERGOCALCIFEROL) 1.25 MG (50000 UNIT) PO CAPS: 50000.0000 [IU] | ORAL_CAPSULE | ORAL | 0 refills | Status: DC

## 2020-07-25 NOTE — Progress Notes (Signed)
Chief Complaint:   OBESITY Lori Joseph is here to discuss her progress with her obesity treatment plan along with follow-up of her obesity related diagnoses. Lori Joseph is on the Category 2 Plan and states she is following her eating plan approximately 70-80% of the time. Lori Joseph states she is doing some walking.   Today's visit was #: 6 Starting weight: 210 lbs Starting date: 05/02/2020 Today's weight: 200 lbs Today's date: 07/21/2020 Total lbs lost to date: 10 Total lbs lost since last in-office visit: 0  Interim History: Lori Joseph has been cooking now that she can walk and her knee pain has improved. She voices that she is still eating some tomato sandwiches with [place] and cheese. She had oatmeal for breakfast. She has a few indulgent dinners of pasta or tuna salad, and apple cider. She does not need to go to Ortho again for knee evaluation.  Subjective:   1. Vitamin D deficiency Lori Joseph denies nausea, vomiting, or muscle weakness, but she notes fatigue. Last Vit D level was 37.2.  2. Other constipation Lori Joseph is taking stool softeners occasionally. Her symptoms aren't severe at this time.  Assessment/Plan:   1. Vitamin D deficiency Low Vitamin D level contributes to fatigue and are associated with obesity, breast, and colon cancer. We will refill prescription Vitamin D for 1 month. Lori Joseph will follow-up for routine testing of Vitamin D, at least 2-3 times per year to avoid over-replacement.  - Vitamin D, Ergocalciferol, (DRISDOL) 1.25 MG (50000 UNIT) CAPS capsule; Take 1 capsule (50,000 Units total) by mouth every 7 (seven) days.  Dispense: 4 capsule; Refill: 0  2. Other constipation Lori Joseph was informed that a decrease in bowel movement frequency is normal while losing weight, but stools should not be hard or painful. Lori Joseph is to take miralax 17 g PO daily OTC. Orders and follow up as documented in patient record.   Counseling Getting to Good Bowel Health: Your goal is to have one soft bowel  movement each day. Drink at least 8 glasses of water each day. Eat plenty of fiber (goal is over 25 grams each day). It is best to get most of your fiber from dietary sources which includes leafy green vegetables, fresh fruit, and whole grains. You may need to add fiber with the help of OTC fiber supplements. These include Metamucil, Citrucel, and Flaxseed. If you are still having trouble, try adding Miralax or Magnesium Citrate. If all of these changes do not work, Cabin crew.  3. Class 2 severe obesity with serious comorbidity and body mass index (BMI) of 35.0 to 35.9 in adult, unspecified obesity type [facility]) Lori Joseph is currently in the action stage of change. As such, her goal is to continue with weight loss efforts. She has agreed to the Category 2 Plan or the [place].   Exercise goals: All adults should avoid inactivity. Some physical activity is better than none, and adults who participate in any amount of physical activity gain some health benefits.  Behavioral modification strategies: increasing lean protein intake, increasing vegetables, meal planning and cooking strategies and keeping healthy foods in the home.  Lori Joseph has agreed to follow-up with our clinic in 2 to 3 weeks. She was informed of the importance of frequent follow-up visits to maximize her success with intensive lifestyle modifications for her multiple health conditions.   Objective:   Blood pressure 114/68, pulse 76, temperature 98.1 F (36.7 C), temperature source Oral, height 5\' 3"  (1.6 m), weight 200 lb (90.7 kg), SpO2  97 %. Body mass index is 35.43 kg/m.  General: Cooperative, alert, well developed, in no acute distress. HEENT: Conjunctivae and lids unremarkable. Cardiovascular: Regular rhythm.  Lungs: Normal work of breathing. Neurologic: No focal deficits.   Lab Results  Component Value Date   CREATININE 0.83 05/02/2020   BUN 25 05/02/2020   NA 141 05/02/2020   K 4.6 05/02/2020   CL 104  05/02/2020   CO2 25 05/02/2020   Lab Results  Component Value Date   ALT 10 05/02/2020   AST 17 05/02/2020   ALKPHOS 102 05/02/2020   BILITOT 0.7 05/02/2020   Lab Results  Component Value Date   HGBA1C 5.8 (H) 05/02/2020   HGBA1C 5.9 10/16/2019   HGBA1C 6.0 10/09/2018   HGBA1C 5.8 04/25/2017   HGBA1C 5.7 08/24/2015   Lab Results  Component Value Date   INSULIN 10.5 05/02/2020   INSULIN 15.9 01/01/2019   Lab Results  Component Value Date   TSH 2.300 05/02/2020   Lab Results  Component Value Date   CHOL 173 05/02/2020   HDL 54 05/02/2020   LDLCALC 96 05/02/2020   LDLDIRECT 177.2 08/04/2013   TRIG 128 05/02/2020   CHOLHDL 3 10/16/2019   Lab Results  Component Value Date   WBC 4.8 05/02/2020   HGB 12.1 05/02/2020   HCT 38.1 05/02/2020   MCV 94 05/02/2020   PLT 177 05/02/2020   Lab Results  Component Value Date   IRON 30 (L) 11/10/2015   TIBC 265 11/10/2015   FERRITIN 154 11/10/2015    Obesity Behavioral Intervention Documentation for Insurance:   Approximately 15 minutes were spent on the discussion below.  ASK: We discussed the diagnosis of obesity with Lori Joseph today and Lori Joseph agreed to give [name] permission to discuss obesity behavioral modification therapy today.  ASSESS: Lori Joseph has the diagnosis of obesity and her BMI today is 35.44. Lori Joseph is in the action stage of change.   ADVISE: Lori Joseph was educated on the multiple health risks of obesity as well as the benefit of weight loss to improve her health. She was advised of the need for long term treatment and the importance of lifestyle modifications to improve her current health and to decrease her risk of future health problems.  AGREE: Multiple dietary modification options and treatment options were discussed and Lori Joseph agreed to follow the recommendations documented in the above note.  ARRANGE: Lori Joseph was educated on the importance of frequent visits to treat obesity as outlined per CMS and USPSTF  guidelines and agreed to schedule her next follow up appointment today.  Attestation Statements:   Reviewed by clinician on day of visit: allergies, medications, problem list, medical history, surgical history, family history, social history, and previous encounter notes.   I, Trixie Dredge, am acting as transcriptionist for Coralie Common, MD.  I have reviewed the above documentation for accuracy and completeness, and I agree with the above. - Jinny Blossom, MD

## 2020-07-27 ENCOUNTER — Ambulatory Visit: Payer: Medicare Other | Admitting: Orthopedic Surgery

## 2020-08-11 ENCOUNTER — Ambulatory Visit (INDEPENDENT_AMBULATORY_CARE_PROVIDER_SITE_OTHER): Payer: Medicare Other | Admitting: Family Medicine

## 2020-08-31 ENCOUNTER — Telehealth: Payer: Self-pay | Admitting: Internal Medicine

## 2020-08-31 NOTE — Progress Notes (Signed)
  Chronic Care Management   Note  08/31/2020 Name: Lori Joseph MRN: [id number] DOB: 12-19-1940  Lori Joseph is a 79 y.o. year old female who is a primary care patient of Colon Branch, MD. I reached out to [name] by phone today in response to a referral sent by [name]'s PCP, Colon Branch, MD.   [name] was given information about Chronic Care Management services today including:  1. CCM service includes personalized support from designated clinical staff supervised by her physician, including individualized plan of care and coordination with other care providers 2. 24/7 contact phone numbers for assistance for urgent and routine care needs. 3. Service will only be billed when office clinical staff spend 20 minutes or more in a month to coordinate care. 4. Only one practitioner may furnish and bill the service in a calendar month. 5. The patient may stop CCM services at any time (effective at the end of the month) by phone call to the office staff.   Patient wishes to consider information provided and/or speak with a member of the care team before deciding about enrollment in care management services.   Follow up plan:   Carley Perdue [employer] Scheduler

## 2020-09-15 ENCOUNTER — Other Ambulatory Visit: Payer: Self-pay

## 2020-09-15 ENCOUNTER — Ambulatory Visit (INDEPENDENT_AMBULATORY_CARE_PROVIDER_SITE_OTHER): Payer: Medicare Other

## 2020-09-15 DIAGNOSIS — Z23 Encounter for immunization: Secondary | ICD-10-CM

## 2020-10-09 ENCOUNTER — Ambulatory Visit: Payer: Medicare Other | Attending: Internal Medicine

## 2020-10-09 DIAGNOSIS — Z23 Encounter for immunization: Secondary | ICD-10-CM

## 2020-10-09 NOTE — Progress Notes (Signed)
   Covid-19 Vaccination Clinic  Name:  Lori Joseph    MRN: [id number] DOB: 1941-09-05  10/09/2020  Lori Joseph was observed post Covid-19 immunization for 15 minutes without incident. She was provided with Vaccine Information Sheet and instruction to access the V-Safe system.   Lori Joseph was instructed to call 911 with any severe reactions post vaccine: Marland Kitchen Difficulty breathing  . Swelling of face and throat  . A fast heartbeat  . A bad rash all over body  . Dizziness and weakness   Immunizations Administered    Name Date Dose VIS Date Route   Pfizer COVID-19 Vaccine 10/09/2020  9:57 AM 0.3 mL 09/14/2020 Intramuscular   Manufacturer: [employer]   Lot: [id number]   [place]: [id number]

## 2020-10-10 ENCOUNTER — Telehealth: Payer: Self-pay | Admitting: Internal Medicine

## 2020-10-10 NOTE — Telephone Encounter (Signed)
Caller Anderson Malta  Call Back # [phone]   Per Anderson Malta with [facility], patient has fallen and injured her self within the pass year and needs a bone density test scheduled.   Please Advise

## 2020-10-10 NOTE — Telephone Encounter (Signed)
Pt has appt 10/18/2020, can discuss with PCP at that time.

## 2020-10-15 ENCOUNTER — Other Ambulatory Visit: Payer: Self-pay

## 2020-10-15 ENCOUNTER — Ambulatory Visit
Admission: EM | Admit: 2020-10-15 | Discharge: 2020-10-15 | Disposition: A | Discharge status: Home / Self Care | Payer: Medicare Other | Attending: Family Medicine | Admitting: Family Medicine

## 2020-10-15 DIAGNOSIS — J209 Acute bronchitis, unspecified: Secondary | ICD-10-CM | POA: Diagnosis not present

## 2020-10-15 MED ORDER — PREDNISONE 20 MG PO TABS: ORAL_TABLET | ORAL | 0 refills | Status: DC

## 2020-10-15 MED ORDER — AZITHROMYCIN 250 MG PO TABS: ORAL_TABLET | ORAL | 0 refills | Status: DC

## 2020-10-15 NOTE — ED Provider Notes (Signed)
[facility]    CSN: [id number] Arrival date & time: 10/15/20  0950      History   Chief Complaint Chief Complaint  Patient presents with  . Cough  . Nasal Congestion    HPI Lori Joseph is a 79 y.o. female.   Initial [facility] visit  This is a 79 year old woman who presents with 5 days of cough and nasal congestion.  She rec'd her Covid booster one week ago and has been wheezing.  She has been taking Mucinex and Flonase without improvement.  She has had no fever and has kept her sense of smell and taste.  No GI symptoms.     Past Medical History:  Diagnosis Date  . Allergic rhinitis   . Anemia   . Atrial fibrillation ([place]) 03/2017  . Family history of anesthesia complication    daughter hard to wake up  . Glaucoma   . H/O acute pancreatitis   . H/O retinal detachment   . Hurthle cell neoplasm of thyroid    Right   . HYPERLIPIDEMIA   . Hypertension   . Hypothyroidism 05/31/2015  . OSTEOPENIA   . Personal history of radiation therapy 2015  . Primary cancer of upper outer quadrant of left female breast ([place])    Dr. Lindi Adie  . Radiation 11/11/15-12/30/14   left breast 50.4 gray, lumpectomy cavity boosted to 62.4 gray  . Swelling of both lower extremities   . Vitamin D deficiency     Patient Active Problem List   Diagnosis Date Noted  . Atrial fibrillation ([place]).Onset 03/2017 04/26/2017  . Pulmonary infiltrates on CXR 01/30/2016  . Upper airway cough syndrome 01/30/2016  . PCP NOTES >>>>>>>>>>>>>>>> 08/25/2015  . Hypothyroidism 05/31/2015  . Family history of malignant neoplasm of breast 03/11/2015  . Family history of malignant neoplasm of ovary 03/11/2015  . Breast cancer ([place]) 03/11/2015  . PCP comments--R Chest wall and flank pain 12/31/2014  . 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/2014  . Chest pain 12/31/2014  . Breast cancer of upper-outer quadrant of left female breast ([place]) 09/08/2014   . Eczema 08/05/2014  . Annual physical exam >>>>>>>>>>>>>>>>>>>>>>>> 06/18/2012  . BCC (basal cell carcinoma of skin) 06/18/2012  . OTHER&UNSPECIFIED DISEASES THE ORAL SOFT TISSUES 02/10/2011  . HYPERLIPIDEMIA 01/12/2011  . OSTEOPENIA 09/28/2009  . 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 Morbid obesity ([place]) 05/18/2009  . Essential hypertension 10/15/2007  . PANCREATITIS, HX OF 03/13/2007    Past Surgical History:  Procedure Laterality Date  . BREAST BIOPSY Left 08/27/14  . BREAST BIOPSY Left 11/27/2016  . BREAST LUMPECTOMY Left 09/15/2014  . BREAST LUMPECTOMY    . CHOLECYSTECTOMY  2000  . DILATION AND CURETTAGE OF UTERUS    . laser eye surgery, detached retina Left   . RADIOACTIVE SEED GUIDED PARTIAL MASTECTOMY WITH AXILLARY SENTINEL LYMPH NODE BIOPSY Left 09/16/2014   Procedure: RADIOACTIVE SEED GUIDED PARTIAL MASTECTOMY WITH AXILLARY SENTINEL LYMPH NODE BIOPSY;  Surgeon: [name], MD;  Location: [place];  Service: General;  Laterality: Left;  . thyoidectomy Left    partial  . THYROID LOBECTOMY N/A 02/10/2015   Procedure: RIGHT THYROID LOBECTOMY;  Surgeon: [name], MD;  Location: [facility];  Service: General;  Laterality: N/A;  . TONSILLECTOMY    . TUBAL LIGATION      OB History    Gravida  2   Para  2   Term  Preterm      AB      Living        SAB      TAB      Ectopic      Multiple      Live Births               Home Medications    Prior to Admission medications   Medication Sig Start Date End Date Taking? Authorizing Provider  azithromycin (ZITHROMAX) 250 MG tablet Take 2 tabs PO x 1 dose, then 1 tab PO QD x 4 days 10/15/20   Robyn Haber, MD  calcium carbonate 1250 MG capsule Take 2,500 mg by mouth every morning.     [provider]  cholecalciferol (VITAMIN D) 1000 UNITS tablet Take 2,500 Units by mouth every morning.     [provider]  dorzolamide-timolol (COSOPT) 22.3-6.8 MG/ML  ophthalmic solution Place 1 drop into both eyes every morning.     [provider]  hydrochlorothiazide (HYDRODIURIL) 25 MG tablet Take 0.5 tablets (12.5 mg total) by mouth daily. 07/19/20   Colon Branch, MD  latanoprost (XALATAN) 0.005 % ophthalmic solution PLACE 1 DROP INTO BOTH EYES NIGHTLY. 12/03/16   [provider]  levothyroxine (SYNTHROID) 25 MCG tablet Take 1 tablet (25 mcg total) by mouth daily before breakfast. 07/19/20   Colon Branch, MD  Melatonin 10 MG TABS Take by mouth.    [provider]  metoprolol succinate (TOPROL XL) 50 MG 24 hr tablet Take 50 mg by mouth daily. 50 mg am 07/21/20   Colon Branch, MD  metoprolol tartrate (LOPRESSOR) 50 MG tablet Take 50 mg by mouth 2 (two) times daily. 08/07/20   [provider]  Multiple Vitamin (MULTIVITAMIN) tablet Take 1 tablet by mouth every morning.     [provider]  polyethylene glycol (MIRALAX / GLYCOLAX) 17 g packet Take 17 g by mouth daily.    [provider]  pravastatin (PRAVACHOL) 40 MG tablet Take 1 tablet (40 mg total) by mouth daily. 07/19/20   Colon Branch, MD  predniSONE (DELTASONE) 20 MG tablet one daily with food 10/15/20   Robyn Haber, MD  rivaroxaban (XARELTO) 20 MG TABS tablet Take 1 tablet (20 mg total) by mouth daily with supper. 12/23/19   Colon Branch, MD  Vitamin D, Ergocalciferol, (DRISDOL) 1.25 MG (50000 UNIT) CAPS capsule Take 1 capsule (50,000 Units total) by mouth every 7 (seven) days. 07/21/20   Laqueta Linden, MD    Family History Family History  Problem Relation Age of Onset  . Breast cancer Sister 28  . Cancer Father        lung cancer ; smoker  . Cancer Brother        3 brothers with lung cancer, all smokers  . Cancer Paternal Uncle        2 pat uncles with lung cancer, smokers  . Kidney cancer Sister 53  . Breast cancer Sister   . Melanoma Sister 18  . Ovarian cancer Other 57       niece with ovarian cancer (related through sister with breast  cancer)  . Colon cancer Neg Hx   . CAD Neg Hx   . Esophageal cancer Neg Hx   . Stomach cancer Neg Hx   . Rectal cancer Neg Hx     Social History Social History   Tobacco Use  . Smoking status: Never Smoker  . Smokeless tobacco: Never Used  Vaping Use  . Vaping Use: Never used  Substance Use Topics  . Alcohol use: No    Alcohol/week: 0.0 standard drinks  . Drug use: No     Allergies   Codeine, Hyaluronic acid  [collagen-chond-hyaluronic acid], Carvedilol, Felodipine, Lisinopril, Losartan potassium, and Radiaplexrx [pyridoxine-zinc picolinate]   Review of Systems Review of Systems  Constitutional: Negative.   Respiratory: Positive for cough and wheezing.      Physical Exam Triage Vital Signs ED Triage Vitals  Enc Vitals Group     BP 10/15/20 1003 124/73     Pulse Rate 10/15/20 1003 68     Resp 10/15/20 1003 20     Temp 10/15/20 1003 97.8 F (36.6 C)     Temp Source 10/15/20 1003 Oral     SpO2 10/15/20 1003 95 %     Weight --      Height --      Head Circumference --      Peak Flow --      Pain Score 10/15/20 1005 0     Pain Loc --      Pain Edu? --      Excl. in [place]? --    No data found.  Updated Vital Signs BP 124/73 (BP Location: Right Arm)   Pulse 68   Temp 97.8 F (36.6 C) (Oral)   Resp 20   LMP  (LMP Unknown)   SpO2 95%    Physical Exam Vitals and nursing note reviewed.  Constitutional:      Appearance: Normal appearance. She is normal weight.  HENT:     Head: Normocephalic.     Mouth/Throat:     Mouth: Mucous membranes are moist.     Pharynx: No posterior oropharyngeal erythema.  Eyes:     Conjunctiva/sclera: Conjunctivae normal.  Cardiovascular:     Rate and Rhythm: Rhythm irregular.  Pulmonary:     Effort: Pulmonary effort is normal.     Breath sounds: Wheezing and rhonchi present.  Musculoskeletal:        General: Normal range of motion.     Cervical back: Normal range of motion and neck supple.  Skin:    General: Skin is  warm and dry.  Neurological:     General: No focal deficit present.     Mental Status: She is alert and oriented to person, place, and time.  Psychiatric:        Mood and Affect: Mood normal.        Thought Content: Thought content normal.      UC Treatments / Results  Labs (all labs ordered are listed, but only abnormal results are displayed) Labs Reviewed - No data to display  EKG   Radiology No results found.  Procedures Procedures (including critical care time)  Medications Ordered in UC Medications - No data to display  Initial Impression / Assessment and Plan / UC Course  I have reviewed the triage vital signs and the nursing notes.  Pertinent labs & imaging results that were available during my care of the patient were reviewed by me and considered in my medical decision making (see chart for details).    Final Clinical Impressions(s) / UC Diagnoses   Final diagnoses:  Acute bronchitis, unspecified organism     Discharge Instructions     Because of your bad reaction to codeine, let's just use Robitussin for now.    ED Prescriptions    Medication Sig Dispense Auth. Provider   azithromycin (ZITHROMAX) 250  MG tablet Take 2 tabs PO x 1 dose, then 1 tab PO QD x 4 days 6 tablet Robyn Haber, MD   predniSONE (DELTASONE) 20 MG tablet one daily with food 5 tablet Robyn Haber, MD     I have reviewed the PDMP during this encounter.   Robyn Haber, MD 10/15/20 1025

## 2020-10-15 NOTE — ED Triage Notes (Addendum)
Pt is here with a cough and nasal congestion that started Monday, pt has taken OTC meds to relieve discomfort.

## 2020-10-15 NOTE — Discharge Instructions (Addendum)
Because of your bad reaction to codeine, let's just use Robitussin for now.

## 2020-10-18 ENCOUNTER — Ambulatory Visit (INDEPENDENT_AMBULATORY_CARE_PROVIDER_SITE_OTHER): Payer: Medicare Other | Admitting: Internal Medicine

## 2020-10-18 ENCOUNTER — Other Ambulatory Visit: Payer: Self-pay

## 2020-10-18 ENCOUNTER — Encounter: Payer: Self-pay | Admitting: Internal Medicine

## 2020-10-18 VITALS — BP 119/83 | HR 70 | Temp 97.6°F | Ht 63.0 in | Wt 198.0 lb

## 2020-10-18 DIAGNOSIS — E038 Other specified hypothyroidism: Secondary | ICD-10-CM | POA: Diagnosis not present

## 2020-10-18 DIAGNOSIS — E559 Vitamin D deficiency, unspecified: Secondary | ICD-10-CM | POA: Diagnosis not present

## 2020-10-18 DIAGNOSIS — R739 Hyperglycemia, unspecified: Secondary | ICD-10-CM | POA: Diagnosis not present

## 2020-10-18 DIAGNOSIS — I10 Essential (primary) hypertension: Secondary | ICD-10-CM

## 2020-10-18 DIAGNOSIS — J40 Bronchitis, not specified as acute or chronic: Secondary | ICD-10-CM

## 2020-10-18 DIAGNOSIS — Z0000 Encounter for general adult medical examination without abnormal findings: Secondary | ICD-10-CM

## 2020-10-18 LAB — COMPREHENSIVE METABOLIC PANEL
ALT: 59 U/L — ABNORMAL HIGH (ref 0–35)
AST: 43 U/L — ABNORMAL HIGH (ref 0–37)
Albumin: 3.8 g/dL (ref 3.5–5.2)
Alkaline Phosphatase: 72 U/L (ref 39–117)
BUN: 24 mg/dL — ABNORMAL HIGH (ref 6–23)
CO2: 29 mEq/L (ref 19–32)
Calcium: 9.2 mg/dL (ref 8.4–10.5)
Chloride: 104 mEq/L (ref 96–112)
Creatinine, Ser: 0.8 mg/dL (ref 0.40–1.20)
GFR: 70.06 mL/min (ref >60.00)
Glucose, Bld: 93 mg/dL (ref 70–99)
Potassium: 3.8 mEq/L (ref 3.5–5.1)
Sodium: 141 mEq/L (ref 135–145)
Total Bilirubin: 0.7 mg/dL (ref 0.2–1.2)
Total Protein: 6.1 g/dL (ref 6.0–8.3)

## 2020-10-18 LAB — TSH: TSH: 2.54 u[IU]/mL (ref 0.35–4.50)

## 2020-10-18 LAB — HEMOGLOBIN A1C: Hgb A1c MFr Bld: 5.9 % (ref 4.6–6.5)

## 2020-10-18 LAB — VITAMIN D 25 HYDROXY (VIT D DEFICIENCY, FRACTURES): VITD: 50.17 ng/mL (ref 30.00–100.00)

## 2020-10-18 MED ORDER — ALBUTEROL SULFATE HFA 108 (90 BASE) MCG/ACT IN AERS: 2.0000 | INHALATION_SPRAY | Freq: Four times a day (QID) | RESPIRATORY_TRACT | 0 refills | Status: DC | PRN

## 2020-10-18 NOTE — Assessment & Plan Note (Signed)
Here for CPX Hyperglycemia, check A1c HTN: Seems controlled, continue HCTZ, Toprol-XL 50 mg daily. Hyperlipidemia: On Pravachol, last FLP satisfactory Hypothyroidism: On Synthroid, checking labs Vitamin D deficiency: Status post ergocalciferol, now on OTCs.  Checking levels Morbid obesity: Used to be seen at the wellness clinic, plans to go back soon A. fib: Anticoagulated without apparent problems, rate controlled. Oncology: Last visit with oncology 01/2020. Bronchitis: As described above, reports wheezing, exam with slight increase expiratory time and few end expiratory wheezing.  (No history of bronchospasm before) rec: Continue Zithromax, prednisone, albuterol as needed, discussed how to use it.  Call if no better.    RTC 4 months

## 2020-10-18 NOTE — Patient Instructions (Addendum)
For cough: Finished prednisone and Zithromax OTC Mucinex DM as needed Use the inhaler 3-4 times a day as needed if wheezing Call if not back to normal in few days  Proceed with your second Shingrix dose in few weeks  GO TO THE LAB : Get the blood work     [place] back for a checkup in 4 months

## 2020-10-18 NOTE — Assessment & Plan Note (Signed)
-   Td 2018  -: PNM [id number] and 2018; Prevnar 2015 - zostavax 2012 - shingrix 10/2019, rec to proceed w/ 2nd shot in few weeks - covid vax x 3    - s/p Flu shot   -Female care:  per gyn  H/o breast ca,lastMMG 11-2019 ([facility]) -CCS: Cscope @ [facility] 01-2008 : 2 polyps ---->tubular adenomas w/ low grade dysplasia Cscope 08/2010, Dr Henrene Pastor, very small polyps Last Cscope 08-2016: Adenomatous polyps, no follow-up due to age per GI letter  --Labs: CMP, A1c, TSH, vitamin D -Diet and exercise discussed

## 2020-10-18 NOTE — Progress Notes (Signed)
Subjective:    Patient ID: Lori Joseph, female    DOB: Apr 02, 1941, 79 y.o.   MRN: [id number]  DOS:  10/18/2020 Type of visit - description: CPX Here for CPX, all other issues discussed:  Went to the UC 3 days ago, DX bronchitis, Rx Zithromax, prednisone Symptoms are about the same, continue with cough on and off, at times intense and cannot sleep. She also hears some wheezing. Denies fever chills.  No chest pain no difficulty breathing.  + Sputum production, white.  Review of Systems Denies any GI or GU symptoms   Other than above, a 14 point review of systems is negative      Past Medical History:  Diagnosis Date  . Allergic rhinitis   . Anemia   . Atrial fibrillation ([place]) 03/2017  . Family history of anesthesia complication    daughter hard to wake up  . Glaucoma   . H/O acute pancreatitis   . H/O retinal detachment   . Hurthle cell neoplasm of thyroid    Right   . HYPERLIPIDEMIA   . Hypertension   . Hypothyroidism 05/31/2015  . OSTEOPENIA   . Personal history of radiation therapy 2015  . Primary cancer of upper outer quadrant of left female breast ([place])    Dr. Lindi Adie  . Radiation 11/11/15-12/30/14   left breast 50.4 gray, lumpectomy cavity boosted to 62.4 gray  . Swelling of both lower extremities   . Vitamin D deficiency     Past Surgical History:  Procedure Laterality Date  . BREAST BIOPSY Left 08/27/14  . BREAST BIOPSY Left 11/27/2016  . BREAST LUMPECTOMY Left 09/15/2014  . BREAST LUMPECTOMY    . CHOLECYSTECTOMY  2000  . DILATION AND CURETTAGE OF UTERUS    . laser eye surgery, detached retina Left   . RADIOACTIVE SEED GUIDED PARTIAL MASTECTOMY WITH AXILLARY SENTINEL LYMPH NODE BIOPSY Left 09/16/2014   Procedure: RADIOACTIVE SEED GUIDED PARTIAL MASTECTOMY WITH AXILLARY SENTINEL LYMPH NODE BIOPSY;  Surgeon: [name], MD;  Location: [facility];  Service: General;  Laterality: Left;  . thyoidectomy Left    partial  . THYROID LOBECTOMY N/A  02/10/2015   Procedure: RIGHT THYROID LOBECTOMY;  Surgeon: [name], MD;  Location: [facility];  Service: General;  Laterality: N/A;  . TONSILLECTOMY    . TUBAL LIGATION      Allergies as of 10/18/2020      Reactions   Codeine Anaphylaxis   Hyaluronic Acid  [collagen-chond-hyaluronic Acid] Itching, Rash   Carvedilol    REACTION: leg pain, edema   Felodipine    REACTION: HA, insomnia   Lisinopril    REACTION: HA, SWELLING   Losartan Potassium    REACTION: cough   Radiaplexrx [pyridoxine-zinc Picolinate] Itching, Rash      Medication List       Accurate as of October 18, 2020  1:48 PM. If you have any questions, ask your nurse or doctor.        STOP taking these medications   metoprolol tartrate 50 MG tablet Commonly known as: LOPRESSOR Stopped by: Kathlene November, MD   polyethylene glycol 17 g packet Commonly known as: MIRALAX / GLYCOLAX Stopped by: Kathlene November, MD   Vitamin D (Ergocalciferol) 1.25 MG (50000 UNIT) Caps capsule Commonly known as: DRISDOL Stopped by: Kathlene November, MD     TAKE these medications   albuterol 108 (90 Base) MCG/ACT inhaler Commonly known as: VENTOLIN HFA Inhale 2 puffs into the lungs every 6 (six) hours  as needed for wheezing or shortness of breath. Started by: Kathlene November, MD   azithromycin 250 MG tablet Commonly known as: ZITHROMAX Take 2 tabs PO x 1 dose, then 1 tab PO QD x 4 days   calcium carbonate 1250 MG capsule Take 2,500 mg by mouth every morning.   cholecalciferol 1000 units tablet Commonly known as: VITAMIN D Take 2,500 Units by mouth every morning.   dorzolamide-timolol 22.3-6.8 MG/ML ophthalmic solution Commonly known as: COSOPT Place 1 drop into both eyes every morning.   hydrochlorothiazide 25 MG tablet Commonly known as: HYDRODIURIL Take 0.5 tablets (12.5 mg total) by mouth daily.   latanoprost 0.005 % ophthalmic solution Commonly known as: XALATAN PLACE 1 DROP INTO BOTH EYES NIGHTLY.   levothyroxine 25 MCG  tablet Commonly known as: SYNTHROID Take 1 tablet (25 mcg total) by mouth daily before breakfast.   Melatonin 10 MG Tabs Take by mouth.   multivitamin tablet Take 1 tablet by mouth every morning.   pravastatin 40 MG tablet Commonly known as: PRAVACHOL Take 1 tablet (40 mg total) by mouth daily.   predniSONE 20 MG tablet Commonly known as: DELTASONE one daily with food   rivaroxaban 20 MG Tabs tablet Commonly known as: Xarelto Take 1 tablet (20 mg total) by mouth daily with supper.   Toprol XL 50 MG 24 hr tablet Generic drug: metoprolol succinate Take 50 mg by mouth daily. 50 mg am          Objective:   Physical Exam BP 119/83 (BP Location: Right Arm, Patient Position: Sitting, Cuff Size: Large)   Pulse 70   Temp 97.6 F (36.4 C) (Oral)   Ht 5\' 3"  (1.6 m)   Wt 198 lb (89.8 kg)   LMP  (LMP Unknown)   SpO2 99%   BMI 35.07 kg/m  General: Well developed, NAD, BMI noted Neck: Healed surgical scar, no mass or lymphadenopathies HEENT:  Normocephalic . Face symmetric, atraumatic Lungs:  Few end expiratory wheezing Normal respiratory effort, no intercostal retractions, no accessory muscle use. Heart: Irregularly irregular no murmur.  Abdomen:  Not distended, soft, non-tender. No rebound or rigidity.   Lower extremities: no pretibial edema bilaterally  Skin: Exposed areas without rash. Not pale. Not jaundice Neurologic:  alert & oriented X3.  Speech normal, gait appropriate for age and unassisted Strength symmetric and appropriate for age.  Psych: Cognition and judgment appear intact.  Cooperative with normal attention span and concentration.  Behavior appropriate. No anxious or depressed appearing.     Assessment    Assessment  Hyperglycemia (a1c= 6.0 11.2019) HTN Hyperlipidemia Hypothyroidism Osteopenia: T score 2010 normal,  T score 05-2012 (-1.2), T score 03/2018 -1.1; on calcium and vitamin D Morbid Obesity CV: -A. fib, new onset 03-2017 -Low risk  stress test 04-2017 Oncology: --Breast cancer, left, dx  2015, s/p XRT 2016, on Arimidex --Hurthle Cell neoplasm of the thyroid, thyroidectomy 01-2015 SKIN: ---Eczema ---[facility] 2012  ANEMIA- saw hematology, rx IV iron 10-2015 Chronic right-sided chest pain:  x-rays CT abdomen and pelvis 2016 negative, saw pain mngmt, ortho ---> better with Lidoderm patch and a local injection in the back Glaucoma   H/o acute pancreatitis H/o retinal  detachment Pulmonary: Abnormal chest x-ray and CT chest: Last CT chest 01-2016: See report. CXR 03/27/2016: No acute. Pulmonary: f/u prn ++ FH Lung Ca (pt is not a smoker)  PLAN: Here for CPX Hyperglycemia, check A1c HTN: Seems controlled, continue HCTZ, Toprol-XL 50 mg daily. Hyperlipidemia: On Pravachol, last FLP satisfactory  Hypothyroidism: On Synthroid, checking labs Vitamin D deficiency: Status post ergocalciferol, now on OTCs.  Checking levels Morbid obesity: Used to be seen at the wellness clinic, plans to go back soon A. fib: Anticoagulated without apparent problems, rate controlled. Oncology: Last visit with oncology 01/2020. Bronchitis: As described above, reports wheezing, exam with slight increase expiratory time and few end expiratory wheezing.  (No history of bronchospasm before) rec: Continue Zithromax, prednisone, albuterol as needed, discussed how to use it.  Call if no better.    RTC 4 months  Today I spent 22 minutes in addition to CPX addressing all her chronic medical issues as well as an acute problem (bronchitis).  This visit occurred during the SARS-CoV-2 public health emergency.  Safety protocols were in place, including screening questions prior to the visit, additional usage of staff PPE, and extensive cleaning of exam room while observing appropriate contact time as indicated for disinfecting solutions.

## 2020-11-01 ENCOUNTER — Other Ambulatory Visit: Payer: Self-pay | Admitting: Internal Medicine

## 2020-11-01 MED ORDER — METOPROLOL SUCCINATE ER 50 MG PO TB24: 50.0000 mg | ORAL_TABLET | Freq: Every day | ORAL | 1 refills | Status: DC

## 2020-11-02 ENCOUNTER — Other Ambulatory Visit: Payer: Self-pay | Admitting: Internal Medicine

## 2020-11-02 DIAGNOSIS — L821 Other seborrheic keratosis: Secondary | ICD-10-CM | POA: Diagnosis not present

## 2020-11-02 DIAGNOSIS — I8393 Asymptomatic varicose veins of bilateral lower extremities: Secondary | ICD-10-CM | POA: Diagnosis not present

## 2020-11-02 DIAGNOSIS — D229 Melanocytic nevi, unspecified: Secondary | ICD-10-CM | POA: Diagnosis not present

## 2020-11-02 DIAGNOSIS — D1801 Hemangioma of skin and subcutaneous tissue: Secondary | ICD-10-CM | POA: Diagnosis not present

## 2020-11-02 DIAGNOSIS — L814 Other melanin hyperpigmentation: Secondary | ICD-10-CM | POA: Diagnosis not present

## 2020-11-04 DIAGNOSIS — H2513 Age-related nuclear cataract, bilateral: Secondary | ICD-10-CM | POA: Diagnosis not present

## 2020-11-04 DIAGNOSIS — H40023 Open angle with borderline findings, high risk, bilateral: Secondary | ICD-10-CM | POA: Diagnosis not present

## 2020-11-17 ENCOUNTER — Other Ambulatory Visit: Payer: Self-pay | Admitting: Internal Medicine

## 2020-12-10 NOTE — Progress Notes (Signed)
Date:  12/16/2020   ID:  Lori Joseph, DOB Jan 24, 1941, MRN [id number]   Provider Location: Office  PCP:  Colon Branch, MD  Cardiologist:  Jenkins Rouge, MD  Electrophysiologist:  None   Evaluation Performed:  Follow-Up Visit  Chief Complaint:  Atrial fib  History of Present Illness:    Lori Joseph is a 80 y.o. female first seen in June 2018 for new onset atrial fibrillation. She was started on metoprolol and xaretltoCHADVASC is 4 Converted spontaneously   June 2018 Echo ordered and reviewed EF 55-60% mild LAE trivial MR Myovue normal EF 56% no ischemia    Discussed phone apps and iwatch apps for PAF Had left shoulder pain and had subarcromial steroid injection 11/25/18  Younger sister diagnosed with brain tumor She has passed   Monitoring was done 03/31/19  and NSR with PACs no PAF or significant arrhythmia.    She has not really had any further a fib. More sedentary during [place] Husband over 340 lbs And needs a TKR but cant lose weight    10/18/20 seen by primary Dr Larose Kells and Rx for bronchitis with Zithromax/prednisone Has had COVID vaccine and booster  Was asking about mild thistle I believe she wants to take to protect liver and lower BS As far as my research tells it is ok to take with xarelto Issues with interaction only with Plavix and coumadin for blood thinners    Past Medical History:  Diagnosis Date  . Allergic rhinitis   . Anemia   . Atrial fibrillation ([place]) 03/2017  . Family history of anesthesia complication    daughter hard to wake up  . Glaucoma   . H/O acute pancreatitis   . H/O retinal detachment   . Hurthle cell neoplasm of thyroid    Right   . HYPERLIPIDEMIA   . Hypertension   . Hypothyroidism 05/31/2015  . OSTEOPENIA   . Personal history of radiation therapy 2015  . Primary cancer of upper outer quadrant of left female breast ([place])    Dr. Lindi Adie  . Radiation 11/11/15-12/30/14   left breast 50.4 gray, lumpectomy cavity boosted to  62.4 gray  . Swelling of both lower extremities   . Vitamin D deficiency    Past Surgical History:  Procedure Laterality Date  . BREAST BIOPSY Left 08/27/14  . BREAST BIOPSY Left 11/27/2016  . BREAST LUMPECTOMY Left 09/15/2014  . BREAST LUMPECTOMY    . CHOLECYSTECTOMY  2000  . DILATION AND CURETTAGE OF UTERUS    . laser eye surgery, detached retina Left   . RADIOACTIVE SEED GUIDED PARTIAL MASTECTOMY WITH AXILLARY SENTINEL LYMPH NODE BIOPSY Left 09/16/2014   Procedure: RADIOACTIVE SEED GUIDED PARTIAL MASTECTOMY WITH AXILLARY SENTINEL LYMPH NODE BIOPSY;  Surgeon: [name], MD;  Location: [facility];  Service: General;  Laterality: Left;  . thyoidectomy Left    partial  . THYROID LOBECTOMY N/A 02/10/2015   Procedure: RIGHT THYROID LOBECTOMY;  Surgeon: [name], MD;  Location: [facility];  Service: General;  Laterality: N/A;  . TONSILLECTOMY    . TUBAL LIGATION       No outpatient medications have been marked as taking for the 12/16/20 encounter (Appointment) with Josue Hector, MD.     Allergies:   Codeine, Hyaluronic acid  [collagen-chond-hyaluronic acid], Carvedilol, Felodipine, Lisinopril, Losartan potassium, and Radiaplexrx [pyridoxine-zinc picolinate]   Social History   Tobacco Use  . Smoking status: Never Smoker  . Smokeless tobacco: Never Used  Vaping Use  .  Vaping Use: Never used  Substance Use Topics  . Alcohol use: No    Alcohol/week: 0.0 standard drinks  . Drug use: No     Family Hx: The patient's family history includes Breast cancer in her sister; Breast cancer (age of onset: 64) in her sister; Cancer in her brother, father, and paternal uncle; Kidney cancer (age of onset: 30) in her sister; Melanoma (age of onset: 3) in her sister; Ovarian cancer (age of onset: 25) in an other family member. There is no history of Colon cancer, CAD, Esophageal cancer, Stomach cancer, or Rectal cancer.  ROS:   Please see the history of present illness.     General:no colds or fevers, no weight changes Skin:no rashes or ulcers HEENT:no blurred vision, no congestion CV:see HPI PUL:see HPI GI:no diarrhea constipation or melena, no indigestion GU:no hematuria, no dysuria MS:no joint pain, no claudication Neuro:no syncope, no lightheadedness Endo:no diabetes, + thyroid disease  All other systems reviewed and are negative.   Prior CV studies:   The following studies were reviewed today:  Cardiac event monitor 4/20  Notes recorded by Josue Hector, MD on 03/31/2019 at 12:04 PM EDT NSR rate PAC;s no PAF no significant arrhythmia  Echo 05/08/17 Study Conclusions  - Left ventricle: The cavity size was normal. Wall thickness was   normal. Systolic function was normal. The estimated ejection   fraction was in the range of 55% to 60%. Wall motion was normal;   there were no regional wall motion abnormalities. There was no   evidence of elevated ventricular filling pressure by Doppler   parameters. - Aortic valve: Trileaflet; mildly thickened, mildly calcified   leaflets. - Mitral valve: Calcified annulus. There was trivial regurgitation. - Left atrium: The atrium was mildly dilated. - Tricuspid valve: There was mild regurgitation.   Labs/Other Tests and Data Reviewed:    EKG:   02/16/19 afib rate 133 nonspecific ST changes    Recent Labs: 05/02/2020: Hemoglobin 12.1; Platelets 177 10/18/2020: ALT 59; BUN 24; Creatinine, Ser 0.80; Potassium 3.8; Sodium 141; TSH 2.54   Recent Lipid Panel Lab Results  Component Value Date/Time   CHOL 173 05/02/2020 12:42 PM   TRIG 128 05/02/2020 12:42 PM   HDL 54 05/02/2020 12:42 PM   CHOLHDL 3 10/16/2019 09:50 AM   LDLCALC 96 05/02/2020 12:42 PM   LDLDIRECT 177.2 08/04/2013 11:47 AM    Wt Readings from Last 3 Encounters:  10/18/20 89.8 kg  07/21/20 90.7 kg  07/19/20 91.3 kg     Objective:    Vital Signs:  LMP  (LMP Unknown)    Affect appropriate Healthy:  appears stated age HEENT:  normal Neck supple with no adenopathy JVP normal no bruits no thyromegaly Lungs clear with no wheezing and good diaphragmatic motion Heart:  S1/S2 no murmur, no rub, gallop or click PMI normal Abdomen: benighn, BS positve, no tenderness, no AAA no bruit.  No HSM or HJR Distal pulses intact with no bruits No edema Neuro non-focal Skin warm and dry No muscular weakness   ASSESSMENT & PLAN:    1. PAF :  Stable NSR change on daily Toprol now  2. anticoagulation on xarelto.  Continue no bleeding issues  3. Hypothyroid on replacement followed by PCP TSH 2.5 10/18/20  4. HLD:  On statin labs with primary   COVID-19 Education: The signs and symptoms of COVID-19 were discussed with the patient and how to seek care for testing (follow up with PCP or arrange E-visit).  The importance of social distancing was discussed today.  Time:   Today, I have spent 30 minutes with patient     Medication Adjustments/Labs and Tests Ordered: Current medicines are reviewed at length with the patient today.  Concerns regarding medicines are outlined above.   Tests Ordered: No orders of the defined types were placed in this encounter.   Medication Changes: No orders of the defined types were placed in this encounter.   Disposition:  Follow up in 6 month(s)  Signed, Jenkins Rouge, MD  12/16/2020 3:38 PM    Lori Joseph

## 2020-12-16 ENCOUNTER — Other Ambulatory Visit: Payer: Self-pay

## 2020-12-16 ENCOUNTER — Ambulatory Visit: Payer: Medicare Other | Admitting: Cardiovascular Disease

## 2020-12-16 ENCOUNTER — Encounter: Payer: Self-pay | Admitting: Cardiovascular Disease

## 2020-12-16 VITALS — BP 120/84 | HR 82 | Ht 63.5 in | Wt 198.0 lb

## 2020-12-16 DIAGNOSIS — Z7901 Long term (current) use of anticoagulants: Secondary | ICD-10-CM | POA: Diagnosis not present

## 2020-12-16 DIAGNOSIS — I480 Paroxysmal atrial fibrillation: Secondary | ICD-10-CM

## 2020-12-16 NOTE — Patient Instructions (Signed)
Medication Instructions:  Your physician recommends that you continue on your current medications as directed. Please refer to the Current Medication list given to you today.  *If you need a refill on your cardiac medications before your next appointment, please call your pharmacy*  Lab Work: None ordered today  Testing/Procedures: None ordered today  Follow-Up: At [facility], you and your health needs are our priority.  As part of our continuing mission to provide you with exceptional heart care, we have created designated Provider Care Teams.  These Care Teams include your primary Cardiologist (physician) and Advanced Practice Providers (APPs -  Physician Assistants and Nurse Practitioners) who all work together to provide you with the care you need, when you need it.  Your next appointment:   12 month(s)  The format for your next appointment:   In Person  Provider:   You may see Jenkins Rouge, MD or one of the following Advanced Practice Providers on your designated Care Team:    Cecilie Kicks, NP  Kathyrn Drown, NP

## 2020-12-20 ENCOUNTER — Other Ambulatory Visit: Payer: Self-pay | Admitting: Internal Medicine

## 2020-12-20 DIAGNOSIS — Z9889 Other specified postprocedural states: Secondary | ICD-10-CM

## 2021-01-08 ENCOUNTER — Other Ambulatory Visit: Payer: Self-pay | Admitting: Internal Medicine

## 2021-01-18 ENCOUNTER — Other Ambulatory Visit: Payer: Self-pay | Admitting: Internal Medicine

## 2021-02-17 ENCOUNTER — Ambulatory Visit (INDEPENDENT_AMBULATORY_CARE_PROVIDER_SITE_OTHER): Payer: Medicare Other | Admitting: Internal Medicine

## 2021-02-17 ENCOUNTER — Encounter: Payer: Self-pay | Admitting: Internal Medicine

## 2021-02-17 ENCOUNTER — Other Ambulatory Visit: Payer: Self-pay

## 2021-02-17 VITALS — BP 126/72 | HR 63 | Temp 98.1°F | Resp 18 | Ht 64.0 in | Wt 201.4 lb

## 2021-02-17 DIAGNOSIS — R739 Hyperglycemia, unspecified: Secondary | ICD-10-CM | POA: Diagnosis not present

## 2021-02-17 DIAGNOSIS — G4700 Insomnia, unspecified: Secondary | ICD-10-CM

## 2021-02-17 DIAGNOSIS — Z1159 Encounter for screening for other viral diseases: Secondary | ICD-10-CM

## 2021-02-17 DIAGNOSIS — E038 Other specified hypothyroidism: Secondary | ICD-10-CM

## 2021-02-17 DIAGNOSIS — R7989 Other specified abnormal findings of blood chemistry: Secondary | ICD-10-CM

## 2021-02-17 LAB — HEPATIC FUNCTION PANEL
ALT: 18 U/L (ref 0–35)
AST: 18 U/L (ref 0–37)
Albumin: 4.1 g/dL (ref 3.5–5.2)
Alkaline Phosphatase: 78 U/L (ref 39–117)
Bilirubin, Direct: 0.2 mg/dL (ref 0.0–0.3)
Total Bilirubin: 0.9 mg/dL (ref 0.2–1.2)
Total Protein: 6.2 g/dL (ref 6.0–8.3)

## 2021-02-17 LAB — TSH: TSH: 3.47 u[IU]/mL (ref 0.35–4.50)

## 2021-02-17 LAB — HEMOGLOBIN A1C: Hgb A1c MFr Bld: 5.9 % (ref 4.6–6.5)

## 2021-02-17 MED ORDER — ZOLPIDEM TARTRATE 5 MG PO TABS: 5.0000 mg | ORAL_TABLET | Freq: Every evening | ORAL | 0 refills | Status: DC | PRN

## 2021-02-17 NOTE — Progress Notes (Signed)
Subjective:    Patient ID: Lori Joseph, female    DOB: [birth year], 80 y.o.   MRN: [id number]  DOS:  02/17/2021 Type of visit - description: Follow-up Since the Last office visit she is feeling well. Did report difficulty sleeping, she takes melatonin, has taken Tylenol PM. The difficulty is falling asleep.  Saw cardiology, note reviewed.  Wt Readings from Last 3 Encounters:  02/17/21 201 lb 6 oz (91.3 kg)  12/16/20 198 lb (89.8 kg)  10/18/20 198 lb (89.8 kg)   BP Readings from Last 3 Encounters:  02/17/21 126/72  12/16/20 120/84  10/18/20 119/83    Review of Systems See above   Past Medical History:  Diagnosis Date  . Allergic rhinitis   . Anemia   . Atrial fibrillation ([facility]) 03/2017  . Family history of anesthesia complication    daughter hard to wake up  . Glaucoma   . H/O acute pancreatitis   . H/O retinal detachment   . Hurthle cell neoplasm of thyroid    Right   . HYPERLIPIDEMIA   . Hypertension   . Hypothyroidism 05/31/2015  . OSTEOPENIA   . Personal history of radiation therapy 2015  . Primary cancer of upper outer quadrant of left female breast ([place])    Dr. Lindi Adie  . Radiation 11/11/15-12/30/14   left breast 50.4 gray, lumpectomy cavity boosted to 62.4 gray  . Swelling of both lower extremities   . Vitamin D deficiency     Past Surgical History:  Procedure Laterality Date  . BREAST BIOPSY Left 08/27/14  . BREAST BIOPSY Left 11/27/2016  . BREAST LUMPECTOMY Left 09/15/2014  . BREAST LUMPECTOMY    . CHOLECYSTECTOMY  2000  . DILATION AND CURETTAGE OF UTERUS    . laser eye surgery, detached retina Left   . RADIOACTIVE SEED GUIDED PARTIAL MASTECTOMY WITH AXILLARY SENTINEL LYMPH NODE BIOPSY Left 09/16/2014   Procedure: RADIOACTIVE SEED GUIDED PARTIAL MASTECTOMY WITH AXILLARY SENTINEL LYMPH NODE BIOPSY;  Surgeon: [name], MD;  Location: [facility];  Service: General;  Laterality: Left;  . thyoidectomy Left    partial  . THYROID  LOBECTOMY N/A 02/10/2015   Procedure: RIGHT THYROID LOBECTOMY;  Surgeon: [name], MD;  [facility];  Service: General;  Laterality: N/A;  . TONSILLECTOMY    . TUBAL LIGATION      Allergies as of 02/17/2021      Reactions   Codeine Anaphylaxis   Hyaluronic Acid  [collagen-chond-hyaluronic Acid] Itching, Rash   Carvedilol    REACTION: leg pain, edema   Felodipine    REACTION: HA, insomnia   Lisinopril    REACTION: HA, SWELLING   Losartan Potassium    REACTION: cough   Radiaplexrx [pyridoxine-zinc Picolinate] Itching, Rash      Medication List       Accurate as of February 17, 2021 11:59 PM. If you have any questions, ask your nurse or doctor.        albuterol 108 (90 Base) MCG/ACT inhaler Commonly known as: VENTOLIN HFA Inhale 2 puffs into the lungs every 6 (six) hours as needed for wheezing or shortness of breath.   calcium carbonate 1250 MG capsule Take 2,500 mg by mouth every morning.   cholecalciferol 1000 units tablet Commonly known as: VITAMIN D Take 2,500 Units by mouth every morning.   dorzolamide-timolol 22.3-6.8 MG/ML ophthalmic solution Commonly known as: COSOPT Place 1 drop into both eyes every morning.   hydrochlorothiazide 25 MG tablet Commonly known as: HYDRODIURIL  Take 0.5 tablets (12.5 mg total) by mouth daily.   latanoprost 0.005 % ophthalmic solution Commonly known as: XALATAN PLACE 1 DROP INTO BOTH EYES NIGHTLY.   levothyroxine 25 MCG tablet Commonly known as: SYNTHROID Take 1 tablet (25 mcg total) by mouth daily before breakfast.   Melatonin 10 MG Tabs Take by mouth.   metoprolol succinate 50 MG 24 hr tablet Commonly known as: Toprol XL Take 1 tablet (50 mg total) by mouth daily.   multivitamin tablet Take 1 tablet by mouth every morning.   pravastatin 40 MG tablet Commonly known as: PRAVACHOL Take 1 tablet (40 mg total) by mouth daily.   rivaroxaban 20 MG Tabs tablet Commonly known as: Xarelto Take 1 tablet (20 mg total)  by mouth daily with supper.   zolpidem 5 MG tablet Commonly known as: AMBIEN Take 1 tablet (5 mg total) by mouth at bedtime as needed for sleep. Started by: Kathlene November, MD          Objective:   Physical Exam BP 126/72 (BP Location: Right Arm, Patient Position: Sitting, Cuff Size: Normal)   Pulse 63   Temp 98.1 F (36.7 C) (Oral)   Resp 18   Ht 5\' 4"  (1.626 m)   Wt 201 lb 6 oz (91.3 kg)   LMP  (LMP Unknown)   SpO2 96%   BMI 34.57 kg/m  General:   Well developed, NAD, BMI noted. HEENT:  Normocephalic . Face symmetric, atraumatic Lungs:  CTA B Normal respiratory effort, no intercostal retractions, no accessory muscle use. Heart: RRR,  no murmur.  Lower extremities: no pretibial edema bilaterally  Skin: Not pale. Not jaundice Neurologic:  alert & oriented X3.  Speech normal, gait appropriate for age and unassisted Psych--  Cognition and judgment appear intact.  Cooperative with normal attention span and concentration.  Behavior appropriate. No anxious or depressed appearing.      Assessment     Assessment  Hyperglycemia (a1c= 6.0 11.2019) HTN Hyperlipidemia Hypothyroidism Osteopenia: T score 2010 normal,  T score 05-2012 (-1.2), T score 03/2018 -1.1; on calcium and vitamin D Morbid Obesity CV: -A. fib, new onset 03-2017 -Low risk stress test 04-2017 Oncology: --Breast cancer, left, dx  2015, s/p XRT 2016, on Arimidex --Hurthle Cell neoplasm of the thyroid, thyroidectomy 01-2015 SKIN: ---Eczema ---[facility] 2012  ANEMIA- saw hematology, rx IV iron 10-2015 Chronic right-sided chest pain:  x-rays CT abdomen and pelvis 2016 negative, saw pain mngmt, ortho ---> better with Lidoderm patch and a local injection in the back Glaucoma   H/o acute pancreatitis H/o retinal  detachment Pulmonary: Abnormal chest x-ray and CT chest: Last CT chest 01-2016: See report. CXR 03/27/2016: No acute. Pulmonary: f/u prn ++ FH Lung Ca (pt is not a smoker)  PLAN: Hyperglycemia: Check  A1c HTN: BP today is very good, ambulatory BPs in the 120s/70s, continue HCTZ, metoprolol.  Last BMP okay. Increased LFTs: Mild recent elevation, recheck LFTs. Morbid obesity: Some weight gain noted, states she will start working on that this week. Hypothyroidism: Check TSH A. fib: Saw cardiology 12/16/2020, noted to be in NSR, no changes made. Insomnia: new issue. She does not like to take Tylenol PM, we agreed to continue melatonin, sleep tips provided, okay to take Ambien sporadically, Rx sent. Preventive care: Hep C screening RTC 6 months     This visit occurred during the SARS-CoV-2 public health emergency.  Safety protocols were in place, including screening questions prior to the visit, additional usage of staff PPE, and extensive  cleaning of exam room while observing appropriate contact time as indicated for disinfecting solutions.

## 2021-02-17 NOTE — Patient Instructions (Addendum)
Continue checking your blood pressure BP GOAL is between 110/65 and  135/85. If it is consistently higher or lower, let me know  To help with difficulty sleeping: Continue melatonin  Okay to take Ambien 5 mg at bedtime if needed  HEALTHY SLEEP Sleep hygiene: Basic rules for a good night's sleep  Sleep only as much as you need to feel rested and then get out of bed  Keep a regular sleep schedule  Avoid forcing sleep  Exercise regularly for at least 20 minutes, preferably 4 to 5 hours before bedtime  Avoid caffeinated beverages after lunch  Avoid alcohol near bedtime: no "night cap"  Avoid smoking, especially in the evening  Do not go to bed hungry  Adjust bedroom environment  Avoid prolonged use of light-emitting screens before bedtime   Deal with your worries before bedtime      GO TO THE LAB : Get the blood work     GO TO THE FRONT DESK, [place] back for for a checkup in 6 months

## 2021-02-19 NOTE — Assessment & Plan Note (Signed)
Hyperglycemia: Check A1c HTN: BP today is very good, ambulatory BPs in the 120s/70s, continue HCTZ, metoprolol.  Last BMP okay. Increased LFTs: Mild recent elevation, recheck LFTs. Morbid obesity: Some weight gain noted, states she will start working on that this week. Hypothyroidism: Check TSH A. fib: Saw cardiology 12/16/2020, noted to be in NSR, no changes made. Insomnia: new issue. She does not like to take Tylenol PM, we agreed to continue melatonin, sleep tips provided, okay to take Ambien sporadically, Rx sent. Preventive care: Hep C screening RTC 6 months

## 2021-02-20 LAB — HEPATITIS C ANTIBODY
Hepatitis C Ab: NONREACTIVE
SIGNAL TO CUT-OFF: 0.02 (ref <1.00)

## 2021-03-02 ENCOUNTER — Other Ambulatory Visit: Payer: Self-pay | Admitting: Internal Medicine

## 2021-03-02 DIAGNOSIS — Z1231 Encounter for screening mammogram for malignant neoplasm of breast: Secondary | ICD-10-CM

## 2021-03-29 ENCOUNTER — Other Ambulatory Visit: Payer: Self-pay

## 2021-03-29 ENCOUNTER — Ambulatory Visit
Admission: EM | Admit: 2021-03-29 | Discharge: 2021-03-29 | Disposition: A | Discharge status: Home / Self Care | Payer: Medicare Other | Attending: Family Medicine | Admitting: Family Medicine

## 2021-03-29 DIAGNOSIS — R42 Dizziness and giddiness: Secondary | ICD-10-CM

## 2021-03-29 DIAGNOSIS — J3089 Other allergic rhinitis: Secondary | ICD-10-CM

## 2021-03-29 MED ORDER — FLUTICASONE PROPIONATE 50 MCG/ACT NA SUSP: 1.0000 | Freq: Two times a day (BID) | NASAL | 2 refills | Status: DC

## 2021-03-29 MED ORDER — PREDNISONE 20 MG PO TABS: 20.0000 mg | ORAL_TABLET | Freq: Every day | ORAL | 0 refills | Status: DC

## 2021-03-29 MED ORDER — PROMETHAZINE-DM 6.25-15 MG/5ML PO SYRP: 5.0000 mL | ORAL_SOLUTION | Freq: Four times a day (QID) | ORAL | 0 refills | Status: DC | PRN

## 2021-03-29 MED ORDER — MECLIZINE HCL 12.5 MG PO TABS: 12.5000 mg | ORAL_TABLET | Freq: Two times a day (BID) | ORAL | 0 refills | Status: DC | PRN

## 2021-03-29 NOTE — ED Provider Notes (Signed)
[facility]    CSN: [id number] Arrival date & time: 03/29/21  0839      History   Chief Complaint Chief Complaint  Patient presents with  . Sore Throat  . Dizziness  . Nasal Congestion    HPI Lori Joseph is a 80 y.o. female.   Patient presenting today with 4-day history of scratchy throat, congestion, hacking cough.  States she is also over the past day or so been getting room spinning dizziness when she tilts her head backwards.  Does have a history of seasonal allergies, vertigo that have all felt similar in the past.  She denies fever, chills, chest pain, shortness of breath, visual changes, abdominal pain, nausea vomiting diarrhea.  Has been trying Mucinex with no benefit of symptoms.  She is unsure what over-the-counter medication she is allowed to take on her Xarelto.     Past Medical History:  Diagnosis Date  . Allergic rhinitis   . Anemia   . Atrial fibrillation ([place]) 03/2017  . Family history of anesthesia complication    daughter hard to wake up  . Glaucoma   . H/O acute pancreatitis   . H/O retinal detachment   . Hurthle cell neoplasm of thyroid    Right   . HYPERLIPIDEMIA   . Hypertension   . Hypothyroidism 05/31/2015  . OSTEOPENIA   . Personal history of radiation therapy 2015  . 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)    Dr. Lindi Adie  . Radiation 11/11/15-12/30/14   left breast 50.4 gray, lumpectomy cavity boosted to 62.4 gray  . Swelling of both lower extremities   . Vitamin D deficiency     Patient Active Problem List   Diagnosis Date Noted  . Atrial fibrillation ([place]).Onset 03/2017 04/26/2017  . Pulmonary infiltrates on CXR 01/30/2016  . Upper airway cough syndrome 01/30/2016  . PCP NOTES >>>>>>>>>>>>>>>> 08/25/2015  . Hypothyroidism 05/31/2015  . Family history of malignant neoplasm of breast 03/11/2015  . Family history of malignant neoplasm of ovary 03/11/2015  . Breast cancer ([place]) 03/11/2015  . PCP  comments--R Chest wall and flank pain 12/31/2014  . 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/2014  . Chest pain 12/31/2014  . Breast cancer of upper-outer quadrant of left female breast ([place]) 09/08/2014  . Eczema 08/05/2014  . Annual physical exam >>>>>>>>>>>>>>>>>>>>>>>> 06/18/2012  . BCC (basal cell carcinoma of skin) 06/18/2012  . OTHER&UNSPECIFIED DISEASES THE ORAL SOFT TISSUES 02/10/2011  . HYPERLIPIDEMIA 01/12/2011  . OSTEOPENIA 09/28/2009  . 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 Morbid obesity ([place]) 05/18/2009  . Essential hypertension 10/15/2007  . PANCREATITIS, HX OF 03/13/2007    Past Surgical History:  Procedure Laterality Date  . BREAST BIOPSY Left 08/27/14  . BREAST BIOPSY Left 11/27/2016  . BREAST LUMPECTOMY Left 09/15/2014  . BREAST LUMPECTOMY    . CHOLECYSTECTOMY  2000  . DILATION AND CURETTAGE OF UTERUS    . laser eye surgery, detached retina Left   . RADIOACTIVE SEED GUIDED PARTIAL MASTECTOMY WITH AXILLARY SENTINEL LYMPH NODE BIOPSY Left 09/16/2014   Procedure: RADIOACTIVE SEED GUIDED PARTIAL MASTECTOMY WITH AXILLARY SENTINEL LYMPH NODE BIOPSY;  Surgeon: [name], MD;  Location: [facility];  Service: General;  Laterality: Left;  . thyoidectomy Left    partial  . THYROID LOBECTOMY N/A 02/10/2015   Procedure: RIGHT THYROID LOBECTOMY;  Surgeon: [name], MD;  Location: [facility];  Service: General;  Laterality: N/A;  . TONSILLECTOMY    . TUBAL LIGATION      OB History    Gravida  2   Para  2   Term      Preterm      AB      Living        SAB      IAB      Ectopic      Multiple      Live Births               Home Medications    Prior to Admission medications   Medication Sig Start Date End Date Taking? Authorizing Provider  fluticasone (FLONASE) 50 MCG/ACT nasal spray Place 1 spray into both nostrils in the morning and at bedtime. 03/29/21  Yes Volney American, PA-C  meclizine (ANTIVERT) 12.5 MG tablet Take 1 tablet (12.5 mg total) by mouth 2 (two) times daily as needed for dizziness. May cause drowsiness 03/29/21  Yes Volney American, PA-C  predniSONE (DELTASONE) 20 MG tablet Take 1 tablet (20 mg total) by mouth daily with breakfast. 03/29/21  Yes Volney American, PA-C  promethazine-dextromethorphan (PROMETHAZINE-DM) 6.25-15 MG/5ML syrup Take 5 mLs by mouth 4 (four) times daily as needed for cough. 03/29/21  Yes Volney American, PA-C  albuterol (VENTOLIN HFA) 108 (90 Base) MCG/ACT inhaler Inhale 2 puffs into the lungs every 6 (six) hours as needed for wheezing or shortness of breath. Patient not taking: Reported on 02/17/2021 11/02/20   Colon Branch, MD  calcium carbonate 1250 MG capsule Take 2,500 mg by mouth every morning.     [provider]  cholecalciferol (VITAMIN D) 1000 UNITS tablet Take 2,500 Units by mouth every morning.     [provider]  dorzolamide-timolol (COSOPT) 22.3-6.8 MG/ML ophthalmic solution Place 1 drop into both eyes every morning.     [provider]  hydrochlorothiazide (HYDRODIURIL) 25 MG tablet Take 0.5 tablets (12.5 mg total) by mouth daily. 01/09/21   Colon Branch, MD  latanoprost (XALATAN) 0.005 % ophthalmic solution PLACE 1 DROP INTO BOTH EYES NIGHTLY. 12/03/16   [provider]  levothyroxine (SYNTHROID) 25 MCG tablet Take 1 tablet (25 mcg total) by mouth daily before breakfast. 01/18/21   Colon Branch, MD  Melatonin 10 MG TABS Take by mouth.    [provider]  metoprolol succinate (TOPROL XL) 50 MG 24 hr tablet Take 1 tablet (50 mg total) by mouth daily. 11/01/20   Colon Branch, MD  Multiple Vitamin (MULTIVITAMIN) tablet Take 1 tablet by mouth every morning.    [provider]  pravastatin (PRAVACHOL) 40 MG tablet Take 1 tablet (40 mg total) by mouth daily. 01/18/21   Colon Branch, MD  rivaroxaban (XARELTO) 20 MG TABS tablet Take 1 tablet (20 mg total) by  mouth daily with supper. 11/21/20   Colon Branch, MD  zolpidem (AMBIEN) 5 MG tablet Take 1 tablet (5 mg total) by mouth at bedtime as needed for sleep. 02/17/21   Colon Branch, MD    Family History Family History  Problem Relation Age of Onset  . Breast cancer Sister 35  . Cancer Father        lung cancer ; smoker  . Cancer Brother        3 brothers with lung cancer, all smokers  . Cancer Paternal Uncle        2 pat uncles with lung cancer, smokers  .  Kidney cancer Sister 73  . Breast cancer Sister   . Melanoma Sister 91  . Ovarian cancer Other 44       niece with ovarian cancer (related through sister with breast cancer)  . Colon cancer Neg Hx   . CAD Neg Hx   . Esophageal cancer Neg Hx   . Stomach cancer Neg Hx   . Rectal cancer Neg Hx     Social History Social History   Tobacco Use  . Smoking status: Never Smoker  . Smokeless tobacco: Never Used  Vaping Use  . Vaping Use: Never used  Substance Use Topics  . Alcohol use: No    Alcohol/week: 0.0 standard drinks  . Drug use: No     Allergies   Codeine, Hyaluronic acid  [collagen-chond-hyaluronic acid], Carvedilol, Felodipine, Lisinopril, Losartan potassium, and Radiaplexrx [pyridoxine-zinc picolinate]   Review of Systems Review of Systems Per HPI  Physical Exam Triage Vital Signs ED Triage Vitals  Enc Vitals Group     BP 03/29/21 1025 137/75     Pulse Rate 03/29/21 1025 75     Resp 03/29/21 1025 16     Temp 03/29/21 1025 97.9 F (36.6 C)     Temp Source 03/29/21 1025 Oral     SpO2 03/29/21 1025 97 %     Weight --      Height --      Head Circumference --      Peak Flow --      Pain Score 03/29/21 1108 0     Pain Loc --      Pain Edu? --      Excl. in [place]? --    No data found.  Updated Vital Signs BP 137/75 (BP Location: Right Arm)   Pulse 75   Temp 97.9 F (36.6 C) (Oral)   Resp 16   LMP  (LMP Unknown)   SpO2 97%   Visual Acuity Right Eye Distance:   Left Eye Distance:   Bilateral  Distance:    Right Eye Near:   Left Eye Near:    Bilateral Near:     Physical Exam Vitals and nursing note reviewed.  Constitutional:      Appearance: Normal appearance. She is not ill-appearing.  HENT:     Head: Atraumatic.     Right Ear: Tympanic membrane normal.     Left Ear: Tympanic membrane normal.     Nose: Rhinorrhea present.     Mouth/Throat:     Mouth: Mucous membranes are moist.     Pharynx: Oropharynx is clear. Posterior oropharyngeal erythema present.  Eyes:     Extraocular Movements: Extraocular movements intact.     Conjunctiva/sclera: Conjunctivae normal.  Cardiovascular:     Rate and Rhythm: Normal rate.     Heart sounds: Normal heart sounds.  Pulmonary:     Effort: Pulmonary effort is normal. No respiratory distress.     Breath sounds: Normal breath sounds. No wheezing or rales.  Chest:     Chest wall: No tenderness.  Abdominal:     General: Bowel sounds are normal. There is no distension.     Palpations: Abdomen is soft.     Tenderness: There is no abdominal tenderness. There is no guarding.  Musculoskeletal:        General: Normal range of motion.     Cervical back: Normal range of motion and neck supple.  Skin:    General: Skin is warm and dry.  Neurological:  Mental Status: She is alert and oriented to person, place, and time.  Psychiatric:        Mood and Affect: Mood normal.        Thought Content: Thought content normal.        Judgment: Judgment normal.      UC Treatments / Results  Labs (all labs ordered are listed, but only abnormal results are displayed) Labs Reviewed - No data to display  EKG   Radiology No results found.  Procedures Procedures (including critical care time)  Medications Ordered in UC Medications - No data to display  Initial Impression / Assessment and Plan / UC Course  I have reviewed the triage vital signs and the nursing notes.  Pertinent labs & imaging results that were available during my care  of the patient were reviewed by me and considered in my medical decision making (see chart for details).     Overall exam and vital signs very reassuring today.  Declines COVID PCR testing, suspect allergic versus viral symptoms.  Will start Zyrtec, Flonase regimen daily, treat with very small prednisone burst and Phenergan DM for current symptoms.  Meclizine as needed for suspected vertigo symptoms and discussed [place] maneuvers.  Strict return precautions given for acutely worsening symptoms at any time.  Final Clinical Impressions(s) / UC Diagnoses   Final diagnoses:  Seasonal allergic rhinitis due to other allergic trigger  Vertigo   Discharge Instructions   None    ED Prescriptions    Medication Sig Dispense Auth. Provider   promethazine-dextromethorphan (PROMETHAZINE-DM) 6.25-15 MG/5ML syrup Take 5 mLs by mouth 4 (four) times daily as needed for cough. 100 mL Volney American, [place]   meclizine (ANTIVERT) 12.5 MG tablet Take 1 tablet (12.5 mg total) by mouth 2 (two) times daily as needed for dizziness. May cause drowsiness 10 tablet Volney American, [place]   predniSONE (DELTASONE) 20 MG tablet Take 1 tablet (20 mg total) by mouth daily with breakfast. 5 tablet Volney American, PA-C   fluticasone [facility]) 50 MCG/ACT nasal spray Place 1 spray into both nostrils in the morning and at bedtime. 16 g Volney American, [place]     PDMP not reviewed this encounter.   Volney American, [place] 03/29/21 1755

## 2021-03-29 NOTE — ED Triage Notes (Signed)
Patient presents to Urgent Care with complaints of sore throat, dizziness, nasal congestion since Sunday. Pt has a hx of allergies and vertigo. Pt states she is treating her symptoms with Mucinex she is unsure what meds she can take since she is on a blood thinner.   Denies fever and abdominal pain.

## 2021-04-26 ENCOUNTER — Ambulatory Visit
Admission: EM | Admit: 2021-04-26 | Discharge: 2021-04-26 | Disposition: A | Discharge status: Home / Self Care | Payer: Medicare Other | Attending: Emergency Medicine | Admitting: Emergency Medicine

## 2021-04-26 ENCOUNTER — Other Ambulatory Visit: Payer: Self-pay

## 2021-04-26 DIAGNOSIS — S61412A Laceration without foreign body of left hand, initial encounter: Secondary | ICD-10-CM | POA: Diagnosis not present

## 2021-04-26 DIAGNOSIS — W19XXXA Unspecified fall, initial encounter: Secondary | ICD-10-CM | POA: Diagnosis not present

## 2021-04-26 DIAGNOSIS — S60511A Abrasion of right hand, initial encounter: Secondary | ICD-10-CM

## 2021-04-26 DIAGNOSIS — S81812A Laceration without foreign body, left lower leg, initial encounter: Secondary | ICD-10-CM | POA: Diagnosis not present

## 2021-04-26 DIAGNOSIS — S60222A Contusion of left hand, initial encounter: Secondary | ICD-10-CM | POA: Diagnosis not present

## 2021-04-26 MED ORDER — MUPIROCIN 2 % EX OINT: 1.0000 "application " | TOPICAL_OINTMENT | Freq: Two times a day (BID) | CUTANEOUS | 0 refills | Status: DC

## 2021-04-26 NOTE — Discharge Instructions (Signed)
Keep wounds clean and dry, wash with warm soapy water twice daily and dry well May apply Bactroban ointment twice daily to areas Ice and elevate to help with bruising and swelling Please follow-up if any symptoms not improving or worsening, developing signs of infection
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
## 2021-04-27 ENCOUNTER — Telehealth: Payer: Self-pay | Admitting: Internal Medicine

## 2021-04-27 NOTE — Telephone Encounter (Signed)
error 

## 2021-04-28 ENCOUNTER — Ambulatory Visit: Payer: Medicare Other

## 2021-04-28 ENCOUNTER — Other Ambulatory Visit: Payer: Self-pay

## 2021-04-28 ENCOUNTER — Ambulatory Visit
Admission: RE | Admit: 2021-04-28 | Discharge: 2021-04-28 | Disposition: A | Discharge status: Home / Self Care | Payer: Medicare Other | Source: Ambulatory Visit | Attending: Internal Medicine | Admitting: Internal Medicine

## 2021-04-28 DIAGNOSIS — H40003 Preglaucoma, unspecified, bilateral: Secondary | ICD-10-CM | POA: Diagnosis not present

## 2021-04-28 DIAGNOSIS — Z1231 Encounter for screening mammogram for malignant neoplasm of breast: Secondary | ICD-10-CM

## 2021-04-28 DIAGNOSIS — H40013 Open angle with borderline findings, low risk, bilateral: Secondary | ICD-10-CM | POA: Diagnosis not present

## 2021-05-04 ENCOUNTER — Other Ambulatory Visit: Payer: Self-pay | Admitting: Internal Medicine

## 2021-05-20 ENCOUNTER — Other Ambulatory Visit: Payer: Self-pay | Admitting: Internal Medicine

## 2021-06-02 DIAGNOSIS — H524 Presbyopia: Secondary | ICD-10-CM | POA: Diagnosis not present

## 2021-06-02 DIAGNOSIS — H2513 Age-related nuclear cataract, bilateral: Secondary | ICD-10-CM | POA: Diagnosis not present

## 2021-06-02 DIAGNOSIS — H401131 Primary open-angle glaucoma, bilateral, mild stage: Secondary | ICD-10-CM | POA: Diagnosis not present

## 2021-06-02 DIAGNOSIS — H5203 Hypermetropia, bilateral: Secondary | ICD-10-CM | POA: Diagnosis not present

## 2021-06-02 DIAGNOSIS — H52203 Unspecified astigmatism, bilateral: Secondary | ICD-10-CM | POA: Diagnosis not present

## 2021-06-02 DIAGNOSIS — H40003 Preglaucoma, unspecified, bilateral: Secondary | ICD-10-CM | POA: Diagnosis not present

## 2021-06-13 ENCOUNTER — Other Ambulatory Visit: Payer: Self-pay

## 2021-06-13 ENCOUNTER — Ambulatory Visit (INDEPENDENT_AMBULATORY_CARE_PROVIDER_SITE_OTHER): Payer: Medicare Other | Admitting: Internal Medicine

## 2021-06-13 VITALS — BP 137/88 | HR 81 | Temp 98.2°F | Resp 18 | Wt 202.4 lb

## 2021-06-13 DIAGNOSIS — G4700 Insomnia, unspecified: Secondary | ICD-10-CM | POA: Diagnosis not present

## 2021-06-13 DIAGNOSIS — W57XXXA Bitten or stung by nonvenomous insect and other nonvenomous arthropods, initial encounter: Secondary | ICD-10-CM | POA: Diagnosis not present

## 2021-06-13 DIAGNOSIS — S20462A Insect bite (nonvenomous) of left back wall of thorax, initial encounter: Secondary | ICD-10-CM | POA: Diagnosis not present

## 2021-06-13 MED ORDER — BETAMETHASONE DIPROPIONATE AUG 0.05 % EX CREA: TOPICAL_CREAM | Freq: Two times a day (BID) | CUTANEOUS | 0 refills | Status: DC

## 2021-06-13 NOTE — Assessment & Plan Note (Signed)
Tick bite: The patient denies having any rash at the area, no fever chills, no fatigue.  Suspect this is simply a local reaction, prescribed steroids locally, if not better let me know.   Insomnia: Took zolpidem temporarily, the next day become very disoriented, unable to tolerate it and does not like any further prescriptions at this point.

## 2021-06-13 NOTE — Progress Notes (Signed)
Subjective:    Patient ID: Lori Joseph, female    DOB: [birth year], 80 y.o.   MRN: [id number]  DOS:  06/13/2021 Type of visit - description: Acute. Few weeks ago noted a tick at the left upper back, it was removed supposedly completely. The area remains irritated, itching.  Is forming a scab and it bleeds when she scratches.  Also, was intolerant to zolpidem.  Review of Systems She denies any target-like rashes. No fever chills, no unusual fatigue or aches.  No headache  Past Medical History:  Diagnosis Date   Allergic rhinitis    Anemia    Atrial fibrillation ([place]) 03/2017   Family history of anesthesia complication    daughter hard to wake up   Glaucoma    H/O acute pancreatitis    H/O retinal detachment    Hurthle cell neoplasm of thyroid    Right    HYPERLIPIDEMIA    Hypertension    Hypothyroidism 05/31/2015   OSTEOPENIA    Personal history of radiation therapy 2015   Primary cancer of upper outer quadrant of left female breast ([place])    Dr. Lindi Adie   Radiation 11/11/15-12/30/14   left breast 50.4 gray, lumpectomy cavity boosted to 62.4 gray   Swelling of both lower extremities    Vitamin D deficiency     Past Surgical History:  Procedure Laterality Date   BREAST BIOPSY Left 08/27/14   BREAST BIOPSY Left 11/27/2016   BREAST LUMPECTOMY Left 09/15/2014   BREAST LUMPECTOMY     CHOLECYSTECTOMY  2000   DILATION AND CURETTAGE OF UTERUS     laser eye surgery, detached retina Left    RADIOACTIVE SEED GUIDED PARTIAL MASTECTOMY WITH AXILLARY SENTINEL LYMPH NODE BIOPSY Left 09/16/2014   Procedure: RADIOACTIVE SEED GUIDED PARTIAL MASTECTOMY WITH AXILLARY SENTINEL LYMPH NODE BIOPSY;  Surgeon: [name], MD;  Location: [facility];  Service: General;  Laterality: Left;   thyoidectomy Left    partial   THYROID LOBECTOMY N/A 02/10/2015   Procedure: RIGHT THYROID LOBECTOMY;  Surgeon: [name], MD;  Location: [facility];  Service: General;  Laterality: N/A;    TONSILLECTOMY     TUBAL LIGATION      Allergies as of 06/13/2021       Reactions   Codeine Anaphylaxis   Hyaluronic Acid  [collagen-chond-hyaluronic Acid] Itching, Rash   Carvedilol    REACTION: leg pain, edema   Felodipine    REACTION: HA, insomnia   Lisinopril    REACTION: HA, SWELLING   Losartan Potassium    REACTION: cough   Zolpidem Other (See Comments)   Disoriented    Radiaplexrx [pyridoxine-zinc Picolinate] Itching, Rash        Medication List        Accurate as of June 13, 2021  9:13 PM. If you have any questions, ask your nurse or doctor.          STOP taking these medications    zolpidem 5 MG tablet Commonly known as: AMBIEN Stopped by: Kathlene November, MD       TAKE these medications    albuterol 108 (90 Base) MCG/ACT inhaler Commonly known as: VENTOLIN HFA Inhale 2 puffs into the lungs every 6 (six) hours as needed for wheezing or shortness of breath.   augmented betamethasone dipropionate 0.05 % cream Commonly known as: DIPROLENE-AF Apply topically 2 (two) times daily. Started by: Kathlene November, MD   calcium carbonate 1250 MG capsule Take 2,500 mg by mouth every morning.  cholecalciferol 1000 units tablet Commonly known as: VITAMIN D Take 2,500 Units by mouth every morning.   dorzolamide-timolol 22.3-6.8 MG/ML ophthalmic solution Commonly known as: COSOPT Place 1 drop into both eyes every morning.   fluticasone 50 MCG/ACT nasal spray Commonly known as: FLONASE Place 1 spray into both nostrils in the morning and at bedtime.   hydrochlorothiazide 25 MG tablet Commonly known as: HYDRODIURIL Take 0.5 tablets (12.5 mg total) by mouth daily.   latanoprost 0.005 % ophthalmic solution Commonly known as: XALATAN PLACE 1 DROP INTO BOTH EYES NIGHTLY.   levothyroxine 25 MCG tablet Commonly known as: SYNTHROID Take 1 tablet (25 mcg total) by mouth daily before breakfast.   meclizine 12.5 MG tablet Commonly known as: ANTIVERT Take 1 tablet (12.5  mg total) by mouth 2 (two) times daily as needed for dizziness. May cause drowsiness   Melatonin 10 MG Tabs Take by mouth.   metoprolol succinate 50 MG 24 hr tablet Commonly known as: TOPROL-XL Take 1 tablet (50 mg total) by mouth daily.   multivitamin tablet Take 1 tablet by mouth every morning.   mupirocin ointment 2 % Commonly known as: BACTROBAN Apply 1 application topically 2 (two) times daily.   pravastatin 40 MG tablet Commonly known as: PRAVACHOL Take 1 tablet (40 mg total) by mouth daily.   predniSONE 20 MG tablet Commonly known as: DELTASONE Take 1 tablet (20 mg total) by mouth daily with breakfast.   promethazine-dextromethorphan 6.25-15 MG/5ML syrup Commonly known as: PROMETHAZINE-DM Take 5 mLs by mouth 4 (four) times daily as needed for cough.   rivaroxaban 20 MG Tabs tablet Commonly known as: Xarelto Take 1 tablet (20 mg total) by mouth daily with supper.           Objective:   Physical Exam BP 137/88 (BP Location: Right Arm, Patient Position: Sitting, Cuff Size: Large)   Pulse 81   Temp 98.2 F (36.8 C) (Oral)   Resp 18   Wt 202 lb 6.4 oz (91.8 kg)   LMP  (LMP Unknown)   SpO2 100%   BMI 34.74 kg/m  General:   Well developed, NAD, BMI noted. HEENT:  Normocephalic . Face symmetric, atraumatic  skin: At the left upper back has a 4 mm scab, area around is slightly hyperpigmented and erythematous without discharge, abscess, warmness Neurologic:  alert & oriented X3.  Speech normal, gait appropriate for age and unassisted Psych--  Cognition and judgment appear intact.  Cooperative with normal attention span and concentration.  Behavior appropriate. No anxious or depressed appearing.      Assessment       Assessment  Hyperglycemia (a1c= 6.0 11.2019) HTN Hyperlipidemia Hypothyroidism Osteopenia: T score 2010 normal,  T score 05-2012 (-1.2), T score 03/2018 -1.1; on calcium and vitamin D Morbid Obesity CV: -A. fib, new onset 03-2017 -Low  risk stress test 04-2017 Oncology: --Breast cancer, left, dx  2015, s/p XRT 2016, on Arimidex --Hurthle Cell neoplasm of the thyroid, thyroidectomy 01-2015 SKIN: ---Eczema ---[facility] 2012  ANEMIA- saw hematology, rx IV iron 10-2015 Chronic right-sided chest pain:  x-rays CT abdomen and pelvis 2016 negative, saw pain mngmt, ortho ---> better with Lidoderm patch and a local injection in the back Glaucoma   H/o acute pancreatitis H/o retinal  detachment Pulmonary: Abnormal chest x-ray and CT chest: Last CT chest 01-2016: See report. CXR 03/27/2016: No acute. Pulmonary: f/u prn ++ [facility] Lung Ca (pt is not a smoker)  PLAN: Tick bite: The patient denies having any rash at the  area, no fever chills, no fatigue.  Suspect this is simply a local reaction, prescribed steroids locally, if not better let me know.   Insomnia: Took zolpidem temporarily, the next day become very disoriented, unable to tolerate it and does not like any further prescriptions at this point.   This visit occurred during the SARS-CoV-2 public health emergency.  Safety protocols were in place, including screening questions prior to the visit, additional usage of staff PPE, and extensive cleaning of exam room while observing appropriate contact time as indicated for disinfecting solutions.

## 2021-06-13 NOTE — Patient Instructions (Signed)
Apply the cream twice daily for 1 or 2 weeks  If there is not healed or if continue bleeding or itching: Please let me know

## 2021-06-19 DIAGNOSIS — L308 Other specified dermatitis: Secondary | ICD-10-CM | POA: Diagnosis not present

## 2021-06-29 ENCOUNTER — Telehealth: Payer: Self-pay

## 2021-06-29 NOTE — Telephone Encounter (Signed)
Please advise 

## 2021-06-29 NOTE — Telephone Encounter (Signed)
Pt called in states that she has a hemorrhoid tag that needed removed and her gyn wanted to remove it but she was unsure if she would like them to do it. She wanted to know if she should be referral out or let her gyn do it.

## 2021-06-29 NOTE — Telephone Encounter (Addendum)
I do not know who her gynecologist is, but typically they are trained to do those sorts of procedures. If they need to stop Xarelto for the procedure she needs to let cardiology  know.

## 2021-06-29 NOTE — Telephone Encounter (Signed)
Spoke w/ Pt- informed of recommendations. Pt verbalized understanding.  

## 2021-07-04 DIAGNOSIS — K644 Residual hemorrhoidal skin tags: Secondary | ICD-10-CM | POA: Diagnosis not present

## 2021-07-14 ENCOUNTER — Other Ambulatory Visit: Payer: Self-pay | Admitting: Internal Medicine

## 2021-07-21 ENCOUNTER — Other Ambulatory Visit: Payer: Self-pay | Admitting: Internal Medicine

## 2021-08-17 ENCOUNTER — Other Ambulatory Visit: Payer: Self-pay | Admitting: Internal Medicine

## 2021-08-18 ENCOUNTER — Telehealth: Payer: Self-pay | Admitting: Internal Medicine

## 2021-08-18 MED ORDER — RIVAROXABAN 20 MG PO TABS: 20.0000 mg | ORAL_TABLET | Freq: Every day | ORAL | 0 refills | Status: DC

## 2021-08-18 MED ORDER — RIVAROXABAN 20 MG PO TABS: 20.0000 mg | ORAL_TABLET | Freq: Every day | ORAL | 1 refills | Status: DC

## 2021-08-18 NOTE — Telephone Encounter (Signed)
Rx sent. Will call her back shortly.

## 2021-08-18 NOTE — Telephone Encounter (Signed)
Called Pt- she is needing 5 tablets- was informing me the last 3 months she has been missing several Xarelto tablets [facility] and they informed her to contact manufacturer- she did and they are investigating why this happened. Informed her I'd send a new prescription for 5 tablets the amount she is missing- informed her she may have to pay out of pocket for those 5 tablets. I recommended she keep receipt where she paid for them that manufacturer may would reimburse once investigation is over. Pt verbalized understanding.

## 2021-08-18 NOTE — Telephone Encounter (Signed)
Pt called in and stated she is 5 pills short and currently need a meds refill. She will like for someone to give her a call back. Please advise.  rivaroxaban (XARELTO) 20 MG TABS tablet  [id number] [place], [place] - [address] [place] [address]  Phone:  [phone]  Fax:  [phone]

## 2021-08-21 ENCOUNTER — Ambulatory Visit: Payer: Medicare Other | Admitting: Internal Medicine

## 2021-08-21 DIAGNOSIS — Z86010 Personal history of colonic polyps: Secondary | ICD-10-CM | POA: Diagnosis not present

## 2021-08-21 DIAGNOSIS — Z0289 Encounter for other administrative examinations: Secondary | ICD-10-CM

## 2021-08-21 DIAGNOSIS — K629 Disease of anus and rectum, unspecified: Secondary | ICD-10-CM | POA: Diagnosis not present

## 2021-09-01 ENCOUNTER — Ambulatory Visit: Payer: Medicare Other | Admitting: Internal Medicine

## 2021-09-11 ENCOUNTER — Telehealth: Payer: Self-pay | Admitting: Internal Medicine

## 2021-09-12 ENCOUNTER — Other Ambulatory Visit: Payer: Self-pay

## 2021-09-12 ENCOUNTER — Ambulatory Visit: Payer: Medicare Other | Admitting: Internal Medicine

## 2021-09-12 ENCOUNTER — Ambulatory Visit (INDEPENDENT_AMBULATORY_CARE_PROVIDER_SITE_OTHER): Payer: Medicare Other

## 2021-09-12 DIAGNOSIS — Z23 Encounter for immunization: Secondary | ICD-10-CM

## 2021-09-14 ENCOUNTER — Ambulatory Visit (INDEPENDENT_AMBULATORY_CARE_PROVIDER_SITE_OTHER): Payer: Medicare Other | Admitting: Bariatrics

## 2021-09-14 ENCOUNTER — Encounter (INDEPENDENT_AMBULATORY_CARE_PROVIDER_SITE_OTHER): Payer: Self-pay | Admitting: Bariatrics

## 2021-09-14 ENCOUNTER — Other Ambulatory Visit: Payer: Self-pay

## 2021-09-14 VITALS — BP 121/85 | HR 72 | Temp 98.0°F | Ht 63.0 in | Wt 201.0 lb

## 2021-09-14 DIAGNOSIS — R0602 Shortness of breath: Secondary | ICD-10-CM

## 2021-09-14 DIAGNOSIS — Z1331 Encounter for screening for depression: Secondary | ICD-10-CM

## 2021-09-14 DIAGNOSIS — E669 Obesity, unspecified: Secondary | ICD-10-CM

## 2021-09-14 DIAGNOSIS — I480 Paroxysmal atrial fibrillation: Secondary | ICD-10-CM

## 2021-09-14 DIAGNOSIS — E559 Vitamin D deficiency, unspecified: Secondary | ICD-10-CM

## 2021-09-14 DIAGNOSIS — R739 Hyperglycemia, unspecified: Secondary | ICD-10-CM | POA: Insufficient documentation

## 2021-09-14 DIAGNOSIS — R7303 Prediabetes: Secondary | ICD-10-CM

## 2021-09-14 DIAGNOSIS — I10 Essential (primary) hypertension: Secondary | ICD-10-CM | POA: Diagnosis not present

## 2021-09-14 DIAGNOSIS — R7989 Other specified abnormal findings of blood chemistry: Secondary | ICD-10-CM

## 2021-09-14 DIAGNOSIS — Z6835 Body mass index (BMI) 35.0-35.9, adult: Secondary | ICD-10-CM

## 2021-09-14 DIAGNOSIS — E038 Other specified hypothyroidism: Secondary | ICD-10-CM | POA: Diagnosis not present

## 2021-09-14 DIAGNOSIS — R5383 Other fatigue: Secondary | ICD-10-CM

## 2021-09-14 NOTE — Progress Notes (Signed)
Chief Complaint:   OBESITY Lori Joseph (MR# [id number]) is a 80 y.o. female who presents for evaluation and treatment of obesity and related comorbidities. Current BMI is Body mass index is 35.61 kg/m. Lori Joseph has been struggling with her weight for many years and has been unsuccessful in either losing weight, maintaining weight loss, or reaching her healthy weight goal.  Lori Joseph is a returning patient. She likes to cook at times. She craves carbohydrates. She has maintained her weight.   Lori Joseph is currently in the action stage of change and ready to dedicate time achieving and maintaining a healthier weight. Lori Joseph is interested in becoming our patient and working on intensive lifestyle modifications including (but not limited to) diet and exercise for weight loss.  Lori Joseph's habits were reviewed today and are as follows: Her family eats meals together, she thinks her family will eat healthier with her, her desired weight loss is 30 pounds, she started gaining weight the last 5 years, her heaviest weight ever was 225 pounds, she has significant food cravings issues, she snacks frequently in the evenings, she is frequently drinking liquids with calories, she frequently makes poor food choices, she frequently eats larger portions than normal, and she struggles with emotional eating.  Depression Screen Mandeep's Food and Mood (modified PHQ-9) score was 7.  Depression screen PHQ 2/9 09/14/2021  Decreased Interest 2  Down, Depressed, Hopeless 0  PHQ - 2 Score 2  Altered sleeping 1  Tired, decreased energy 1  Change in appetite 1  Feeling bad or failure about yourself  0  Trouble concentrating 2  Moving slowly or fidgety/restless 0  Suicidal thoughts 0  PHQ-9 Score 7  Difficult doing work/chores Not difficult at all  Some recent data might be hidden   Subjective:   1. Other fatigue Lori reports daytime somnolence and admits to waking up still tired. Patent has a history of symptoms of  morning fatigue. Lori Joseph generally gets 5 hours of sleep per night, and states that she has difficulty falling asleep. Snoring is not present. Apneic episodes is not present. Epworth Sleepiness Score is 1.   2. SOBOE (shortness of breath on exertion) Lori Joseph notes increasing shortness of breath with exercising and seems to be worsening over time with weight gain. She notes getting out of breath sooner with activity than she used to. This has not gotten worse recently. Angelee denies shortness of breath at rest or orthopnea.   3. Essential hypertension Lori Joseph's blood pressure is reasonably well controlled.  4. Paroxysmal atrial fibrillation (Lori Joseph) Lori Joseph was diagnosed in 2018. She is currently on Xarelto.  5. Elevated LFTs Lori Joseph denies abdominal pain.  6. Vitamin D deficiency Lori Joseph is currently taking OTC Vitamin D.   7. Other specified hypothyroidism Lori Joseph is taking Levothyroxine currently.   8. Prediabetes Lori Joseph is currently not on medications.  Assessment/Plan:   1. Other fatigue Lori Joseph does feel that her weight is causing her energy to be lower than it should be. Fatigue may be related to obesity, depression or many other causes. Labs will be ordered, and in the meanwhile, Kanoelani will focus on self care including making healthy food choices, increasing physical activity and focusing on stress reduction.  - EKG 12-Lead  2. SOBOE (shortness of breath on exertion) Lori Joseph does feel that she gets out of breath more easily that she used to when she exercises. Skyeler's shortness of breath appears to be obesity related and exercise induced. She has agreed to work on weight loss and  gradually increase exercise to treat her exercise induced shortness of breath. Will continue to monitor closely.   3. Essential hypertension Fiora will continue medications. We will check labs today. She is working on healthy weight loss and exercise to improve blood pressure control. We will watch for signs of hypotension  as she continues her lifestyle modifications.  - Comprehensive metabolic panel  4. Paroxysmal atrial fibrillation (HCC) Lori Joseph will continue medications, and and she will continue to follow up as directed.   5. Elevated LFTs We discussed the likely diagnosis of non-alcoholic fatty liver disease today and how this condition is obesity related. We will check labs today. Lori Joseph was educated the importance of weight loss. Octa agreed to continue with her weight loss efforts with healthier diet and exercise as an essential part of her treatment plan.   - Lipid Panel With LDL/HDL Ratio - Comprehensive metabolic panel  6. Vitamin D deficiency Low Vitamin D level contributes to fatigue and are associated with obesity, breast, and colon cancer. We will check labs today. Lori Joseph agreed to continue taking OTC Vitamin D daily and she will follow-up for routine testing of Vitamin D, at least 2-3 times per year to avoid over-replacement.   - VITAMIN D 25 Hydroxy (Vit-D Deficiency, Fractures)  7. Other specified hypothyroidism Lori Joseph will continue medications. We will check labs today. Orders and follow up as documented in patient record.  Counseling Good thyroid control is important for overall health. Supratherapeutic thyroid levels are dangerous and will not improve weight loss results. Counseling: The correct way to take levothyroxine is fasting, with water, separated by at least 30 minutes from breakfast, and separated by more than 4 hours from calcium, iron, multivitamins, acid reflux medications (PPIs).   - TSH+T4F+T3Free  8. Prediabetes Lori Joseph will continue to work on weight loss, increasing exercise, and decreasing simple carbohydrates to help decrease the risk of diabetes. We will check labs today.  - Insulin, random - Hemoglobin A1c  9. Depression screening Lori Joseph had a positive depression screening. Depression is commonly associated with obesity and often results in emotional eating  behaviors. We will monitor this closely and work on CBT to help improve the non-hunger eating patterns. Referral to Psychology may be required if no improvement is seen as she continues in our clinic.  10. Obesity, current BMI 35.7 Lori Joseph is currently in the action stage of change and her goal is to continue with weight loss efforts. I recommend Lori Joseph begin the structured treatment plan as follows:  She has agreed to the Category 1 Plan.  Lori Joseph will continue meal planning and intentional eating.  Exercise goals: No exercise has been prescribed at this time.   Behavioral modification strategies: increasing lean protein intake, decreasing simple carbohydrates, increasing vegetables, increasing water intake, decreasing eating out, no skipping meals, meal planning and cooking strategies, keeping healthy foods in the home, and planning for success.  She was informed of the importance of frequent follow-up visits to maximize her success with intensive lifestyle modifications for her multiple health conditions. She was informed we would discuss her lab results at her next visit unless there is a critical issue that needs to be addressed sooner. Lori Joseph agreed to keep her next visit at the agreed upon time to discuss these results.  Objective:   Blood pressure 121/85, pulse 72, temperature 98 F (36.7 C), height 5\' 3"  (1.6 m), weight 201 lb (91.2 kg), SpO2 98 %. Body mass index is 35.61 kg/m.  EKG: Normal sinus rhythm, rate 67.  Indirect Calorimeter completed today shows a VO2 of 179 and a REE of 1238.  Her calculated basal metabolic rate is 1245 thus her basal metabolic rate is worse than expected.  General: Cooperative, alert, well developed, in no acute distress. HEENT: Conjunctivae and lids unremarkable. Cardiovascular: Regular rhythm.  Lungs: Normal work of breathing. Neurologic: No focal deficits.   Lab Results  Component Value Date   CREATININE 0.80 10/18/2020   BUN 24 (H) 10/18/2020    NA 141 10/18/2020   K 3.8 10/18/2020   CL 104 10/18/2020   CO2 29 10/18/2020   Lab Results  Component Value Date   ALT 18 02/17/2021   AST 18 02/17/2021   ALKPHOS 78 02/17/2021   BILITOT 0.9 02/17/2021   Lab Results  Component Value Date   HGBA1C 5.9 02/17/2021   HGBA1C 5.9 10/18/2020   HGBA1C 5.8 (H) 05/02/2020   HGBA1C 5.9 10/16/2019   HGBA1C 6.0 10/09/2018   Lab Results  Component Value Date   INSULIN 10.5 05/02/2020   INSULIN 15.9 01/01/2019   Lab Results  Component Value Date   TSH 3.47 02/17/2021   Lab Results  Component Value Date   CHOL 173 05/02/2020   HDL 54 05/02/2020   LDLCALC 96 05/02/2020   LDLDIRECT 177.2 08/04/2013   TRIG 128 05/02/2020   CHOLHDL 3 10/16/2019   Lab Results  Component Value Date   WBC 4.8 05/02/2020   HGB 12.1 05/02/2020   HCT 38.1 05/02/2020   MCV 94 05/02/2020   PLT 177 05/02/2020   Lab Results  Component Value Date   IRON 30 (L) 11/10/2015   TIBC 265 11/10/2015   FERRITIN 154 11/10/2015   Obesity Behavioral Intervention:   Approximately 15 minutes were spent on the discussion below.  ASK: We discussed the diagnosis of obesity with Lori Joseph today and Jemeka agreed to give [name] permission to discuss obesity behavioral modification therapy today.  ASSESS: Tiyonna has the diagnosis of obesity and her BMI today is 35.7. Abbye is in the action stage of change.   ADVISE: Cassandra was educated on the multiple health risks of obesity as well as the benefit of weight loss to improve her health. She was advised of the need for long term treatment and the importance of lifestyle modifications to improve her current health and to decrease her risk of future health problems.  AGREE: Multiple dietary modification options and treatment options were discussed and Dalaysia agreed to follow the recommendations documented in the above note.  ARRANGE: Adeja was educated on the importance of frequent visits to treat obesity as outlined per CMS and  USPSTF guidelines and agreed to schedule her next follow up appointment today.  Attestation Statements:   Reviewed by clinician on day of visit: allergies, medications, problem list, medical history, surgical history, family history, social history, and previous encounter notes.   I, Lizbeth Bark, RMA, am acting as Location manager for [employer], DO.   I have reviewed the above documentation for accuracy and completeness, and I agree with the above. Jearld Lesch, DO

## 2021-09-15 LAB — COMPREHENSIVE METABOLIC PANEL
ALT: 15 IU/L (ref 0–32)
AST: 19 IU/L (ref 0–40)
Albumin/Globulin Ratio: 1.8 (ref 1.2–2.2)
Albumin: 4.2 g/dL (ref 3.7–4.7)
Alkaline Phosphatase: 97 IU/L (ref 44–121)
BUN/Creatinine Ratio: 24 (ref 12–28)
BUN: 21 mg/dL (ref 8–27)
Bilirubin Total: 0.9 mg/dL (ref 0.0–1.2)
CO2: 24 mmol/L (ref 20–29)
Calcium: 9.2 mg/dL (ref 8.7–10.3)
Chloride: 102 mmol/L (ref 96–106)
Creatinine, Ser: 0.87 mg/dL (ref 0.57–1.00)
Globulin, Total: 2.3 g/dL (ref 1.5–4.5)
Glucose: 110 mg/dL — ABNORMAL HIGH (ref 70–99)
Potassium: 4.6 mmol/L (ref 3.5–5.2)
Sodium: 142 mmol/L (ref 134–144)
Total Protein: 6.5 g/dL (ref 6.0–8.5)
eGFR: 67 mL/min/{1.73_m2} (ref >59)

## 2021-09-15 LAB — INSULIN, RANDOM: INSULIN: 13 u[IU]/mL (ref 2.6–24.9)

## 2021-09-15 LAB — LIPID PANEL WITH LDL/HDL RATIO
Cholesterol, Total: 185 mg/dL (ref 100–199)
HDL: 64 mg/dL (ref >39)
LDL Chol Calc (NIH): 105 mg/dL — ABNORMAL HIGH (ref 0–99)
LDL/HDL Ratio: 1.6 ratio (ref 0.0–3.2)
Triglycerides: 88 mg/dL (ref 0–149)
VLDL Cholesterol Cal: 16 mg/dL (ref 5–40)

## 2021-09-15 LAB — TSH+T4F+T3FREE
Free T4: 1.16 ng/dL (ref 0.82–1.77)
T3, Free: 2.9 pg/mL (ref 2.0–4.4)
TSH: 3.64 u[IU]/mL (ref 0.450–4.500)

## 2021-09-15 LAB — VITAMIN D 25 HYDROXY (VIT D DEFICIENCY, FRACTURES): Vit D, 25-Hydroxy: 36.8 ng/mL (ref 30.0–100.0)

## 2021-09-15 LAB — HEMOGLOBIN A1C
Est. average glucose Bld gHb Est-mCnc: 114 mg/dL
Hgb A1c MFr Bld: 5.6 % (ref 4.8–5.6)

## 2021-09-18 ENCOUNTER — Encounter (INDEPENDENT_AMBULATORY_CARE_PROVIDER_SITE_OTHER): Payer: Self-pay | Admitting: Bariatrics

## 2021-09-20 NOTE — Telephone Encounter (Signed)
Error

## 2021-09-28 ENCOUNTER — Encounter (INDEPENDENT_AMBULATORY_CARE_PROVIDER_SITE_OTHER): Payer: Self-pay | Admitting: Bariatrics

## 2021-09-28 ENCOUNTER — Other Ambulatory Visit: Payer: Self-pay

## 2021-09-28 ENCOUNTER — Ambulatory Visit (INDEPENDENT_AMBULATORY_CARE_PROVIDER_SITE_OTHER): Payer: Medicare Other | Admitting: Bariatrics

## 2021-09-28 VITALS — HR 81 | Temp 98.1°F | Ht 63.0 in | Wt 199.0 lb

## 2021-09-28 DIAGNOSIS — E8881 Metabolic syndrome: Secondary | ICD-10-CM

## 2021-09-28 DIAGNOSIS — E559 Vitamin D deficiency, unspecified: Secondary | ICD-10-CM

## 2021-09-28 DIAGNOSIS — Z6835 Body mass index (BMI) 35.0-35.9, adult: Secondary | ICD-10-CM

## 2021-09-28 MED ORDER — VITAMIN D (ERGOCALCIFEROL) 1.25 MG (50000 UNIT) PO CAPS: 50000.0000 [IU] | ORAL_CAPSULE | ORAL | 0 refills | Status: DC

## 2021-09-28 NOTE — Progress Notes (Signed)
Chief Complaint:   OBESITY Lori Joseph is here to discuss her progress with her obesity treatment plan along with follow-up of her obesity related diagnoses. Lori Joseph is on the Category 1 Plan and states she is following her eating plan approximately 50% of the time. Lori Joseph states she is doing 0 minutes 0 times per week.  Today's visit was #: 3 Starting weight: 201 lbs Starting date: 09/14/2021 Today's weight: 199 lbs Today's date: 09/28/2021 Total lbs lost to date: 2 lbs Total lbs lost since last in-office visit: 2 lbs  Interim History: Anglea is down 2 lbs since her first visit.   Subjective:   1. Vitamin D deficiency She is currently taking prescription vitamin D 2,500 IU each week. Her last Vitamin D level was 36.9.  2. Insulin resistance Lori Joseph has a diagnosis of insulin resistance based on her elevated fasting insulin level >5. Her last Insulin resistance level was 13.0.  Assessment/Plan:   1. Vitamin D deficiency Low Vitamin D level contributes to fatigue and are associated with obesity, breast, and colon cancer. We will refill prescription Vitamin D 50,000 IU every week for 1 month with no refills and Dhrithi will follow-up for routine testing of Vitamin D, at least 2-3 times per year to avoid over-replacement.  - Vitamin D, Ergocalciferol, (DRISDOL) 1.25 MG (50000 UNIT) CAPS capsule; Take 1 capsule (50,000 Units total) by mouth every 7 (seven) days.  Dispense: 4 capsule; Refill: 0  2. Insulin resistance Lori Joseph will continue to work on weight loss, exercise, and decreasing simple carbohydrates to help decrease the risk of diabetes. She will increase healthy fats and protein. Lori Joseph agreed to follow-up with [name] as directed to closely monitor her progress.  3. Obesity, current BMI 35.3 Lori Joseph is currently in the action stage of change. As such, her goal is to continue with weight loss efforts. She has agreed to the Category 1 Plan.   Lori Joseph will continue meal planning. We reviewed labs  from 09/14/2021 CMP, Lipids, Vitamin D, A1C, Insulin and Thyroid panel.  Exercise goals: No exercise has been prescribed at this time.  Behavioral modification strategies: increasing lean protein intake, decreasing simple carbohydrates, increasing vegetables, increasing water intake, decreasing eating out, no skipping meals, meal planning and cooking strategies, keeping healthy foods in the home, and planning for success.  Lori Joseph has agreed to follow-up with our clinic in 2 weeks. She was informed of the importance of frequent follow-up visits to maximize her success with intensive lifestyle modifications for her multiple health conditions.   Objective:   Pulse 81, temperature 98.1 F (36.7 C), height 5\' 3"  (1.6 m), weight 199 lb (90.3 kg), SpO2 98 %. Body mass index is 35.25 kg/m.  General: Cooperative, alert, well developed, in no acute distress. HEENT: Conjunctivae and lids unremarkable. Cardiovascular: Regular rhythm.  Lungs: Normal work of breathing. Neurologic: No focal deficits.   Lab Results  Component Value Date   CREATININE 0.87 09/14/2021   BUN 21 09/14/2021   NA 142 09/14/2021   K 4.6 09/14/2021   CL 102 09/14/2021   CO2 24 09/14/2021   Lab Results  Component Value Date   ALT 15 09/14/2021   AST 19 09/14/2021   ALKPHOS 97 09/14/2021   BILITOT 0.9 09/14/2021   Lab Results  Component Value Date   HGBA1C 5.6 09/14/2021   HGBA1C 5.9 02/17/2021   HGBA1C 5.9 10/18/2020   HGBA1C 5.8 (H) 05/02/2020   HGBA1C 5.9 10/16/2019   Lab Results  Component Value Date  INSULIN 13.0 09/14/2021   INSULIN 10.5 05/02/2020   INSULIN 15.9 01/01/2019   Lab Results  Component Value Date   TSH 3.640 09/14/2021   Lab Results  Component Value Date   CHOL 185 09/14/2021   HDL 64 09/14/2021   LDLCALC 105 (H) 09/14/2021   LDLDIRECT 177.2 08/04/2013   TRIG 88 09/14/2021   CHOLHDL 3 10/16/2019   Lab Results  Component Value Date   VD25OH 36.8 09/14/2021   VD25OH 50.17  10/18/2020   VD25OH 37.2 05/02/2020   Lab Results  Component Value Date   WBC 4.8 05/02/2020   HGB 12.1 05/02/2020   HCT 38.1 05/02/2020   MCV 94 05/02/2020   PLT 177 05/02/2020   Lab Results  Component Value Date   IRON 30 (L) 11/10/2015   TIBC 265 11/10/2015   FERRITIN 154 11/10/2015   Attestation Statements:   Reviewed by clinician on day of visit: allergies, medications, problem list, medical history, surgical history, family history, social history, and previous encounter notes.  I, Lizbeth Bark, RMA, am acting as Location manager for [employer], DO.   I have reviewed the above documentation for accuracy and completeness, and I agree with the above. Jearld Lesch, DO

## 2021-10-02 ENCOUNTER — Encounter (INDEPENDENT_AMBULATORY_CARE_PROVIDER_SITE_OTHER): Payer: Self-pay | Admitting: Bariatrics

## 2021-10-15 ENCOUNTER — Other Ambulatory Visit: Payer: Self-pay | Admitting: Internal Medicine

## 2021-10-23 ENCOUNTER — Other Ambulatory Visit: Payer: Self-pay

## 2021-10-23 ENCOUNTER — Encounter (INDEPENDENT_AMBULATORY_CARE_PROVIDER_SITE_OTHER): Payer: Self-pay | Admitting: Bariatrics

## 2021-10-23 ENCOUNTER — Ambulatory Visit (INDEPENDENT_AMBULATORY_CARE_PROVIDER_SITE_OTHER): Payer: Medicare Other | Admitting: Bariatrics

## 2021-10-23 VITALS — BP 126/83 | HR 75 | Temp 97.7°F | Ht 63.0 in | Wt 199.0 lb

## 2021-10-23 DIAGNOSIS — Z6835 Body mass index (BMI) 35.0-35.9, adult: Secondary | ICD-10-CM | POA: Diagnosis not present

## 2021-10-23 DIAGNOSIS — E8881 Metabolic syndrome: Secondary | ICD-10-CM

## 2021-10-23 DIAGNOSIS — I10 Essential (primary) hypertension: Secondary | ICD-10-CM | POA: Diagnosis not present

## 2021-10-23 NOTE — Progress Notes (Signed)
Chief Complaint:   OBESITY Lori Joseph is here to discuss her progress with her obesity treatment plan along with follow-up of her obesity related diagnoses. Lori Joseph is on the Category 1 Plan and states she is following her eating plan approximately 70% of the time. Lori Joseph states she is doing 0 minutes 0 times per week.  Today's visit was #: 4 Starting weight: 201 lbs Starting date: 09/14/2021 Today's weight: 199 lbs Today's date: 10/23/2021 Total lbs lost to date: 2 lbs Total lbs lost since last in-office visit: 0  Interim History: Lori Joseph's weight remains the same. She is not eating enough meat.  Subjective:   1. Insulin resistance Lori Joseph is currently not on medications.  2. Essential hypertension Lori Joseph's blood pressure is controlled. Her last blood pressure was 126/83.  Assessment/Plan:   1. Insulin resistance Lori Joseph will continue to work on weight loss, exercise, and decreasing simple carbohydrates low. Lori Joseph agreed to follow-up with [name] as directed to closely monitor her progress.  2. Essential hypertension Lori Joseph is working on healthy weight loss and exercise to improve blood pressure control. We will watch for signs of hypotension as she continues her lifestyle modifications.  3. Obesity, current BMI 35.3 Lori Joseph will continue her medications. She is currently in the action stage of change. As such, her goal is to continue with weight loss efforts. She has agreed to the Category 1 Plan.   Lori Joseph will increase her protein and her water intake.  Exercise goals: No exercise has been prescribed at this time.  Behavioral modification strategies: increasing lean protein intake, decreasing simple carbohydrates, increasing vegetables, increasing water intake, decreasing eating out, no skipping meals, meal planning and cooking strategies, keeping healthy foods in the home, and planning for success.  Lori Joseph has agreed to follow-up with our clinic in 2-3 weeks with Phineas Douglas, FNP or Mina Marble, NP and 6 weeks with myself. She was informed of the importance of frequent follow-up visits to maximize her success with intensive lifestyle modifications for her multiple health conditions.   Objective:   Blood pressure 126/83, pulse 75, temperature 97.7 F (36.5 C), height 5\' 3"  (1.6 m), weight 199 lb (90.3 kg), SpO2 97 %. Body mass index is 35.25 kg/m.  General: Cooperative, alert, well developed, in no acute distress. HEENT: Conjunctivae and lids unremarkable. Cardiovascular: Regular rhythm.  Lungs: Normal work of breathing. Neurologic: No focal deficits.   Lab Results  Component Value Date   CREATININE 0.87 09/14/2021   BUN 21 09/14/2021   NA 142 09/14/2021   K 4.6 09/14/2021   CL 102 09/14/2021   CO2 24 09/14/2021   Lab Results  Component Value Date   ALT 15 09/14/2021   AST 19 09/14/2021   ALKPHOS 97 09/14/2021   BILITOT 0.9 09/14/2021   Lab Results  Component Value Date   HGBA1C 5.6 09/14/2021   HGBA1C 5.9 02/17/2021   HGBA1C 5.9 10/18/2020   HGBA1C 5.8 (H) 05/02/2020   HGBA1C 5.9 10/16/2019   Lab Results  Component Value Date   INSULIN 13.0 09/14/2021   INSULIN 10.5 05/02/2020   INSULIN 15.9 01/01/2019   Lab Results  Component Value Date   TSH 3.640 09/14/2021   Lab Results  Component Value Date   CHOL 185 09/14/2021   HDL 64 09/14/2021   LDLCALC 105 (H) 09/14/2021   LDLDIRECT 177.2 08/04/2013   TRIG 88 09/14/2021   CHOLHDL 3 10/16/2019   Lab Results  Component Value Date   VD25OH 36.8 09/14/2021  VD25OH 50.17 10/18/2020   VD25OH 37.2 05/02/2020   Lab Results  Component Value Date   WBC 4.8 05/02/2020   HGB 12.1 05/02/2020   HCT 38.1 05/02/2020   MCV 94 05/02/2020   PLT 177 05/02/2020   Lab Results  Component Value Date   IRON 30 (L) 11/10/2015   TIBC 265 11/10/2015   FERRITIN 154 11/10/2015   Attestation Statements:   Reviewed by clinician on day of visit: allergies, medications, problem list, medical history,  surgical history, family history, social history, and previous encounter notes.  I, Lizbeth Bark, RMA, am acting as Location manager for [employer], DO.   I have reviewed the above documentation for accuracy and completeness, and I agree with the above. Jearld Lesch, DO

## 2021-10-24 ENCOUNTER — Ambulatory Visit (INDEPENDENT_AMBULATORY_CARE_PROVIDER_SITE_OTHER): Payer: Medicare Other | Admitting: Internal Medicine

## 2021-10-24 ENCOUNTER — Other Ambulatory Visit: Payer: Self-pay

## 2021-10-24 ENCOUNTER — Encounter: Payer: Self-pay | Admitting: Internal Medicine

## 2021-10-24 VITALS — BP 122/82 | HR 76 | Temp 97.8°F | Resp 18 | Ht 63.0 in | Wt 200.4 lb

## 2021-10-24 DIAGNOSIS — R7303 Prediabetes: Secondary | ICD-10-CM | POA: Diagnosis not present

## 2021-10-24 DIAGNOSIS — Z0000 Encounter for general adult medical examination without abnormal findings: Secondary | ICD-10-CM | POA: Diagnosis not present

## 2021-10-24 DIAGNOSIS — I10 Essential (primary) hypertension: Secondary | ICD-10-CM

## 2021-10-24 DIAGNOSIS — Z85850 Personal history of malignant neoplasm of thyroid: Secondary | ICD-10-CM

## 2021-10-24 DIAGNOSIS — Z780 Asymptomatic menopausal state: Secondary | ICD-10-CM

## 2021-10-24 DIAGNOSIS — E041 Nontoxic single thyroid nodule: Secondary | ICD-10-CM

## 2021-10-24 DIAGNOSIS — F439 Reaction to severe stress, unspecified: Secondary | ICD-10-CM

## 2021-10-24 DIAGNOSIS — Z0001 Encounter for general adult medical examination with abnormal findings: Secondary | ICD-10-CM

## 2021-10-24 DIAGNOSIS — E559 Vitamin D deficiency, unspecified: Secondary | ICD-10-CM

## 2021-10-24 DIAGNOSIS — A630 Anogenital (venereal) warts: Secondary | ICD-10-CM
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
## 2021-10-24 NOTE — Patient Instructions (Addendum)
Vaccines are recommending: Covid booster  Shingrix #2  GO TO THE LAB : Get the blood work     GO TO THE FRONT DESK, Hurley back for a checkup in 6 months   STOP BY THE FIRST FLOOR: Schedule a bone density test and a thyroid ultrasound

## 2021-10-25 ENCOUNTER — Encounter: Payer: Self-pay | Admitting: Internal Medicine

## 2021-10-25 LAB — RPR: RPR Ser Ql: NONREACTIVE

## 2021-10-25 LAB — HIV ANTIBODY (ROUTINE TESTING W REFLEX): HIV 1&2 Ab, 4th Generation: NONREACTIVE

## 2021-10-25 NOTE — Assessment & Plan Note (Addendum)
-   Td 2018  - PNM 23 2008, 2018 ; Prevnar 2015.  PNM 23 booster every 5 to 10 years - zostavax 2012 - shingrix 10/2019, rec to proceed w/ 2nd shot  - bivalent covid vax booster recommended     - s/p Flu shot    -Female care:  per gyn  H/o breast ca, last [facility]G  04-2021 ([facility]) -[facility]: Cscope @ [facility] 01-2008 : 2 polyps ----> tubular adenomas w/  low grade dysplasia Cscope 08/2010, Dr Henrene Pastor, very small polyps Last Cscope 08-2016: Adenomatous polyps, no follow-up due to age per GI letter  --Labs: reviewed   -Diet and exercise discussed -Advance care planning package provided

## 2021-10-25 NOTE — Assessment & Plan Note (Signed)
Here for CPX Hyperglycemia: Last A1c very good HTN: Seems well controlled Hyperlipidemia: Last DL satisfactory, on Pravachol. Hypothyroidism: Last TSH very good, on Synthroid Genital warts: See HPI, check RPR and HIV Stress: see HPI , counseled, listening therapy provided, declined to see a counselor  Breast cancer, h/o:  MMG 04/2021, clinical breast exam @ gyn H/o thyroid cancer: Last [name] 04-2019.  Exam is benign, recheck a [name]. Morbid obesity: Making progress Osteopenia: good vit d levels, check a DEXA RTC  6 m

## 2021-10-26 ENCOUNTER — Ambulatory Visit (HOSPITAL_BASED_OUTPATIENT_CLINIC_OR_DEPARTMENT_OTHER)
Admission: RE | Admit: 2021-10-26 | Discharge: 2021-10-26 | Disposition: A | Discharge status: Home / Self Care | Payer: Medicare Other | Source: Ambulatory Visit | Attending: Internal Medicine | Admitting: Internal Medicine

## 2021-10-26 ENCOUNTER — Telehealth: Payer: Self-pay | Admitting: Internal Medicine

## 2021-10-26 ENCOUNTER — Other Ambulatory Visit: Payer: Self-pay

## 2021-10-26 DIAGNOSIS — Z780 Asymptomatic menopausal state: Secondary | ICD-10-CM | POA: Insufficient documentation

## 2021-10-26 DIAGNOSIS — C73 Malignant neoplasm of thyroid gland: Secondary | ICD-10-CM | POA: Diagnosis not present

## 2021-10-26 DIAGNOSIS — Z85850 Personal history of malignant neoplasm of thyroid: Secondary | ICD-10-CM | POA: Diagnosis not present

## 2021-10-26 DIAGNOSIS — E890 Postprocedural hypothyroidism: Secondary | ICD-10-CM | POA: Diagnosis not present

## 2021-10-26 DIAGNOSIS — E041 Nontoxic single thyroid nodule: Secondary | ICD-10-CM | POA: Diagnosis not present

## 2021-10-26 DIAGNOSIS — Z8616 Personal history of COVID-19: Secondary | ICD-10-CM

## 2021-10-26 DIAGNOSIS — M85852 Other specified disorders of bone density and structure, left thigh: Secondary | ICD-10-CM | POA: Diagnosis not present

## 2021-10-26 HISTORY — DX: Personal history of COVID-19: Z86.16

## 2021-10-26 NOTE — Telephone Encounter (Signed)
Pt dropped off document for provider to see and have on pt's chart (Power of [place] - 4 pages) Document put at front office tray under providers name.

## 2021-10-26 NOTE — Telephone Encounter (Signed)
Received

## 2021-10-30 NOTE — Addendum Note (Signed)
Addended byDamita Dunnings D on: 10/30/2021 08:02 AM   Modules accepted: Orders

## 2021-11-06 ENCOUNTER — Inpatient Hospital Stay: Admission: RE | Admit: 2021-11-06 | Payer: Medicare Other | Source: Ambulatory Visit

## 2021-11-08 ENCOUNTER — Ambulatory Visit (INDEPENDENT_AMBULATORY_CARE_PROVIDER_SITE_OTHER): Payer: Medicare Other | Admitting: Family Medicine

## 2021-11-14 ENCOUNTER — Inpatient Hospital Stay: Admission: RE | Admit: 2021-11-14 | Payer: Medicare Other | Source: Ambulatory Visit

## 2021-11-28 ENCOUNTER — Ambulatory Visit: Payer: Self-pay | Admitting: Surgery

## 2021-11-28 DIAGNOSIS — Z86010 Personal history of colonic polyps: Secondary | ICD-10-CM | POA: Diagnosis not present

## 2021-11-28 DIAGNOSIS — K629 Disease of anus and rectum, unspecified: Secondary | ICD-10-CM | POA: Diagnosis not present

## 2021-12-04 ENCOUNTER — Ambulatory Visit (INDEPENDENT_AMBULATORY_CARE_PROVIDER_SITE_OTHER): Payer: Medicare Other | Admitting: Bariatrics

## 2021-12-04 ENCOUNTER — Other Ambulatory Visit: Payer: Self-pay

## 2021-12-04 ENCOUNTER — Encounter (INDEPENDENT_AMBULATORY_CARE_PROVIDER_SITE_OTHER): Payer: Self-pay | Admitting: Bariatrics

## 2021-12-04 VITALS — BP 130/82 | HR 84 | Temp 98.1°F | Ht 63.0 in | Wt 197.0 lb

## 2021-12-04 DIAGNOSIS — Z6834 Body mass index (BMI) 34.0-34.9, adult: Secondary | ICD-10-CM | POA: Diagnosis not present

## 2021-12-04 DIAGNOSIS — I10 Essential (primary) hypertension: Secondary | ICD-10-CM

## 2021-12-04 DIAGNOSIS — H2513 Age-related nuclear cataract, bilateral: Secondary | ICD-10-CM | POA: Diagnosis not present

## 2021-12-04 DIAGNOSIS — E559 Vitamin D deficiency, unspecified: Secondary | ICD-10-CM

## 2021-12-04 DIAGNOSIS — H401131 Primary open-angle glaucoma, bilateral, mild stage: Secondary | ICD-10-CM | POA: Diagnosis not present

## 2021-12-04 MED ORDER — VITAMIN D (ERGOCALCIFEROL) 1.25 MG (50000 UNIT) PO CAPS: 50000.0000 [IU] | ORAL_CAPSULE | ORAL | 0 refills | Status: DC

## 2021-12-04 NOTE — Progress Notes (Signed)
Chief Complaint:   OBESITY Lori Joseph is here to discuss her progress with her obesity treatment plan along with follow-up of her obesity related diagnoses. Lori Joseph is on the Category 4 Plan and states she is following her eating plan approximately 50% of the time. Lori Joseph states she is doing 0 minutes 0 times per week.  Today's visit was #: 5 Starting weight: 201 lbs Starting date: 09/14/2021 Today's weight: 197 lbs Today's date: 12/04/2021 Total lbs lost to date: 4 lbs Total lbs lost since last in-office visit: 2 lbs  Interim History: Lori Joseph is down an additional 2 lbs over the holidays. She had Covid over the holiday/interm.  Subjective:   1. Vitamin D deficiency Lori Joseph is taking her medications as directed.   2. Essential hypertension Lori Joseph's blood pressure is controlled. Her last blood pressure was 126/83.  Assessment/Plan:   1. Vitamin D deficiency Low Vitamin D level contributes to fatigue and are associated with obesity, breast, and colon cancer. We will refill prescription Vitamin D 50,000 IU every week for 1 month with no refills and Lori Joseph will follow-up for routine testing of Vitamin D, at least 2-3 times per year to avoid over-replacement.  - Vitamin D, Ergocalciferol, (DRISDOL) 1.25 MG (50000 UNIT) CAPS capsule; Take 1 capsule (50,000 Units total) by mouth every 7 (seven) days.  Dispense: 4 capsule; Refill: 0  2. Essential hypertension Lori Joseph will continue taking his medications. She is working on healthy weight loss and exercise to improve blood pressure control. We will watch for signs of hypotension as she continues her lifestyle modifications.  3. Obesity, current BMI 34.9 Lori Joseph is currently in the action stage of change. As such, her goal is to continue with weight loss efforts. She has agreed to the Category 4 Plan.   Lori Joseph will continue meal planning and she will continue intentional eating. She will adhere closely to the plan and get back on the plan.  Exercise  goals:  Lori Joseph is very active and cleaning the house.  Behavioral modification strategies: increasing lean protein intake, decreasing simple carbohydrates, increasing vegetables, increasing water intake, decreasing eating out, no skipping meals, meal planning and cooking strategies, keeping healthy foods in the home, and planning for success.  Lori Joseph has agreed to follow-up with our clinic in 4 weeks. She was informed of the importance of frequent follow-up visits to maximize her success with intensive lifestyle modifications for her multiple health conditions.   Objective:   Blood pressure 130/82, pulse 84, temperature 98.1 F (36.7 C), height 5\' 3"  (1.6 m), weight 197 lb (89.4 kg), SpO2 98 %. Body mass index is 34.9 kg/m.  General: Cooperative, alert, well developed, in no acute distress. HEENT: Conjunctivae and lids unremarkable. Cardiovascular: Regular rhythm.  Lungs: Normal work of breathing. Neurologic: No focal deficits.   Lab Results  Component Value Date   CREATININE 0.87 09/14/2021   BUN 21 09/14/2021   NA 142 09/14/2021   K 4.6 09/14/2021   CL 102 09/14/2021   CO2 24 09/14/2021   Lab Results  Component Value Date   ALT 15 09/14/2021   AST 19 09/14/2021   ALKPHOS 97 09/14/2021   BILITOT 0.9 09/14/2021   Lab Results  Component Value Date   HGBA1C 5.6 09/14/2021   HGBA1C 5.9 02/17/2021   HGBA1C 5.9 10/18/2020   HGBA1C 5.8 (H) 05/02/2020   HGBA1C 5.9 10/16/2019   Lab Results  Component Value Date   INSULIN 13.0 09/14/2021   INSULIN 10.5 05/02/2020   INSULIN 15.9  01/01/2019   Lab Results  Component Value Date   TSH 3.640 09/14/2021   Lab Results  Component Value Date   CHOL 185 09/14/2021   HDL 64 09/14/2021   LDLCALC 105 (H) 09/14/2021   LDLDIRECT 177.2 08/04/2013   TRIG 88 09/14/2021   CHOLHDL 3 10/16/2019   Lab Results  Component Value Date   VD25OH 36.8 09/14/2021   VD25OH 50.17 10/18/2020   VD25OH 37.2 05/02/2020   Lab Results  Component  Value Date   WBC 4.8 05/02/2020   HGB 12.1 05/02/2020   HCT 38.1 05/02/2020   MCV 94 05/02/2020   PLT 177 05/02/2020   Lab Results  Component Value Date   IRON 30 (L) 11/10/2015   TIBC 265 11/10/2015   FERRITIN 154 11/10/2015   Attestation Statements:   Reviewed by clinician on day of visit: allergies, medications, problem list, medical history, surgical history, family history, social history, and previous encounter notes.  I, Lizbeth Bark, RMA, am acting as Location manager for [employer], DO.  I have reviewed the above documentation for accuracy and completeness, and I agree with the above. Jearld Lesch, DO

## 2021-12-05 ENCOUNTER — Encounter (INDEPENDENT_AMBULATORY_CARE_PROVIDER_SITE_OTHER): Payer: Self-pay | Admitting: Bariatrics

## 2021-12-05 ENCOUNTER — Telehealth: Payer: Self-pay

## 2021-12-05 NOTE — Telephone Encounter (Signed)
Received fax from [facility]. Pt is scheduled for excisional rectal biopsy of perianal mass on 12/22/21 under general anesthesia with Dr. Johney Maine. Requesting a letter back to hold Xarelto. Note needs to be faxed to ATTN: Illene Regulus at [phone].

## 2021-12-06 ENCOUNTER — Telehealth: Payer: Self-pay | Admitting: *Deleted

## 2021-12-06 NOTE — Telephone Encounter (Signed)
Okay to proceed from my standpoint however they also need clearance from cardiology.

## 2021-12-06 NOTE — Telephone Encounter (Signed)
Received fax confirmation

## 2021-12-06 NOTE — Telephone Encounter (Signed)
Surgical clearance note typed informing that Pt is cleared from medical standpoint but will need clearance from cardiology and instructions by them on how to proceed w/ holding her Xarelto, note reviewed by Dr. Larose Kells, and faxed to ATTN: Illene Regulus at [phone].

## 2021-12-06 NOTE — Telephone Encounter (Signed)
° °  Pre-operative Risk Assessment    Patient Name: [name]  DOB: 12-14-1940 MRN: [id number]      Request for Surgical Clearance    Procedure:   EXCISIONAL RECTAL Bx OF PERIANAL MASS  Date of Surgery:  Clearance 12/22/21                                 Surgeon:  DR. Michael Boston Surgeon's Group or Practice Name:  [place] Phone number:  [phone] Fax number:  [phone] ATTN: Illene Regulus, CMA   Type of Clearance Requested:   - Medical  - Pharmacy:  Hold Rivaroxaban (Xarelto)     Type of Anesthesia:  General    Additional requests/questions:    [name]   12/06/2021, 3:27 PM

## 2021-12-07 NOTE — Telephone Encounter (Signed)
Primary Cardiologist:Peter Johnsie Cancel, MD  Chart reviewed as part of pre-operative protocol coverage. Because of Lori Joseph's past medical history and time since last visit, he/she will require a follow-up visit in order to better assess preoperative cardiovascular risk.  Pre-op covering staff: - Please schedule appointment and call patient to inform them. - Please contact requesting surgeon's office via preferred method (i.e, phone, fax) to inform them of need for appointment prior to surgery.  If applicable, this message will also be routed to pharmacy pool and/or primary cardiologist for input on holding anticoagulant/antiplatelet agent as requested below so that this information is available at time of patient's appointment.    Emmaline Life, NP-C    12/07/2021, 1:14 PM [place] [address] Office [phone] Fax [phone]

## 2021-12-07 NOTE — Telephone Encounter (Signed)
I s/w the pt and she is agreeable to plan of care for pre op appt tomorrow at [facility] location. Laurann Montana, NP 12/08/21 @ 10:30 [facility] location. I will forward notes to NP for upcoming appt. Will send FYI to requesting office the pt has appt 12/08/21.

## 2021-12-07 NOTE — Telephone Encounter (Signed)
Xarelto managed by primary care and [facility] has already contacted Dr. Larose Kells for recommendations.   Will address cardiac clearance at appointment tomorrow.   Loel Dubonnet, NP
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
## 2021-12-08 ENCOUNTER — Other Ambulatory Visit: Payer: Self-pay

## 2021-12-08 ENCOUNTER — Encounter (HOSPITAL_BASED_OUTPATIENT_CLINIC_OR_DEPARTMENT_OTHER): Payer: Self-pay | Admitting: Family

## 2021-12-08 ENCOUNTER — Ambulatory Visit (HOSPITAL_BASED_OUTPATIENT_CLINIC_OR_DEPARTMENT_OTHER): Payer: Medicare Other | Admitting: Family

## 2021-12-08 VITALS — BP 132/84 | HR 69 | Ht 63.0 in | Wt 198.6 lb

## 2021-12-08 DIAGNOSIS — E038 Other specified hypothyroidism: Secondary | ICD-10-CM | POA: Diagnosis not present

## 2021-12-08 DIAGNOSIS — E782 Mixed hyperlipidemia: Secondary | ICD-10-CM

## 2021-12-08 DIAGNOSIS — Z01810 Encounter for preprocedural cardiovascular examination: Secondary | ICD-10-CM | POA: Diagnosis not present

## 2021-12-08 DIAGNOSIS — D6859 Other primary thrombophilia: Secondary | ICD-10-CM | POA: Diagnosis not present

## 2021-12-08 DIAGNOSIS — I480 Paroxysmal atrial fibrillation: Secondary | ICD-10-CM | POA: Diagnosis not present

## 2021-12-08 NOTE — Patient Instructions (Addendum)
Medication Instructions:  Continue your current medications.   Dr. Larose Kells and Dr. Johney Maine will instruct you on how to hold your Xarelto prior to your upcoming surgery.  *If you need a refill on your cardiac medications before your next appointment, please call your pharmacy*   Lab Work: None ordered today.   Testing/Procedures: Your EKG today showed rate controlled atrial fibrillation which is a stable finding.   Follow-Up: At [facility], you and your health needs are our priority.  As part of our continuing mission to provide you with exceptional heart care, we have created designated Provider Care Teams.  These Care Teams include your primary Cardiologist (physician) and Advanced Practice Providers (APPs -  Physician Assistants and Nurse Practitioners) who all work together to provide you with the care you need, when you need it.  We recommend signing up for the patient portal called "MyChart".  Sign up information is provided on this After Visit Summary.  MyChart is used to connect with patients for Virtual Visits (Telemedicine).  Patients are able to view lab/test results, encounter notes, upcoming appointments, etc.  Non-urgent messages can be sent to your provider as well.   To learn more about what you can do with MyChart, go to [email or web address].    Your next appointment:   June 30th at 9:45 AM with Dr. Johnsie Cancel   Other Instructions You are approved for your surgery. Loel Dubonnet, NP will send a note to Dr. Johney Maine.   If you want to see Dr. Elias Else, PsyD to discuss stress simply call and let us know.   Heart Healthy Diet Recommendations: A low-salt diet is recommended. Meats should be grilled, baked, or boiled. Avoid fried foods. Focus on lean protein sources like fish or chicken with vegetables and fruits. The American Heart Association is a Microbiologist!  American Heart Association Diet and Lifeystyle Recommendations    Exercise recommendations: The American  Heart Association recommends 150 minutes of moderate intensity exercise weekly. Try 30 minutes of moderate intensity exercise 4-5 times per week. This could include walking, jogging, or swimming.

## 2021-12-12 NOTE — Telephone Encounter (Signed)
Addendum: Received a note from [place] for Dr. Johney Maine: cardiology recommend PCP advise regard Xarelto: Hold Xarelto the day prior to surgery, the day of the surgery, then restart ASAP

## 2021-12-14 ENCOUNTER — Other Ambulatory Visit: Payer: Self-pay | Admitting: Internal Medicine

## 2021-12-19 ENCOUNTER — Encounter (HOSPITAL_BASED_OUTPATIENT_CLINIC_OR_DEPARTMENT_OTHER): Payer: Self-pay | Admitting: Surgery

## 2021-12-20 ENCOUNTER — Encounter (HOSPITAL_BASED_OUTPATIENT_CLINIC_OR_DEPARTMENT_OTHER): Payer: Self-pay | Admitting: Surgery

## 2021-12-20 ENCOUNTER — Other Ambulatory Visit: Payer: Self-pay

## 2021-12-20 ENCOUNTER — Ambulatory Visit
Admission: RE | Admit: 2021-12-20 | Discharge: 2021-12-20 | Disposition: A | Discharge status: Home / Self Care | Payer: Medicare Other | Source: Ambulatory Visit | Attending: Internal Medicine | Admitting: Internal Medicine

## 2021-12-20 DIAGNOSIS — E041 Nontoxic single thyroid nodule: Secondary | ICD-10-CM | POA: Diagnosis not present

## 2021-12-20 NOTE — Progress Notes (Addendum)
Spoke w/ via phone for pre-op interview---pt Lab needs dos----   I stat            Patient states asymptomatic no test needed Arrive at -------530 am 12-25-2021 NPO after MN NO Solid Food.  Clear liquids from MN until---430 am Med rec completed Medications to take morning of surgery -----Metoprolol Succinate, Levothyroxine, eye drop Diabetic medication -----n/a Patient instructed no nail polish to be worn day of surgery pt has clear nail polish on Patient instructed to bring photo id and insurance card day of surgery Patient aware to have Driver (ride ) / caregiver    for 24 hours after surgery marshall husband cell [phone] Patient Special Instructions -----follow all bowel prep instructions from dr gross Pre-Op special Istructions -----none Patient verbalized understanding of instructions that were given at this phone interview. Patient denies shortness of breath, chest pain, fever, cough at this phone interview.   Addendum: pt wishes to not pick up ensure pre surgery drinks for 12-25-2021 surgery  Anesthsia: paroxysmal afib, hypothyroid, hyperlipidemia,  htn, left breast cancer. Pt states no cardiac S & S or sob at pre op call.  PCP: dr Lenora Boys 10-24-2021 epic, note dated 12-05-2021 dr paz medical clearance and hold xarelto 1 day before surgery last dose to be 12-20-2021 (pt aware) chart/epic Cardiologist : dr Macon Large 12-16-2020 epic, cardiology clearance note caitlyn walker np 12-08-2021 chart/epic Chest x-ray :08-19-2018 epic EKG :12-11-2021 chart/epic Echo :05-18-2017 epic Stress test:05-22-2017 epic Cardiac Cath : none Activity level: can climb flight of stairs without issues, does all household chores Oncology lov dr Lindi Adie 01-13-2019 epic Thryoid [name] 10-26-2021 epic Sleep Study/ CPAP :none Blood thinner: xarelto : last dose 12-20-2021 per dr Larose Kells note 12-12-2021 , pt aware

## 2021-12-21 NOTE — Anesthesia Preprocedure Evaluation (Addendum)
Anesthesia Evaluation  Patient identified by MRN, date of birth, ID band Patient awake    Reviewed: Allergy & Precautions, NPO status , Patient's Chart, lab work & pertinent test results, reviewed documented beta blocker date and time   History of Anesthesia Complications (+) PROLONGED EMERGENCE and history of anesthetic complications  Airway Mallampati: III  TM Distance: >3 FB Neck ROM: Full    Dental  (+) Partial Lower, Partial Upper, Missing, Dental Advisory Given,    Pulmonary neg pulmonary ROS,    Pulmonary exam normal breath sounds clear to auscultation       Cardiovascular hypertension, Pt. on medications and Pt. on home beta blockers + dysrhythmias (xarelto last dose 48h ago) Atrial Fibrillation  Rhythm:Irregular Rate:Normal     Neuro/Psych negative neurological ROS  negative psych ROS   GI/Hepatic negative GI ROS, Neg liver ROS,   Endo/Other  Hypothyroidism BMI 35  Renal/GU negative Renal ROS  negative genitourinary   Musculoskeletal negative musculoskeletal ROS (+)   Abdominal (+) + obese,   Peds  Hematology negative hematology ROS (+)   Anesthesia Other Findings   Reproductive/Obstetrics negative OB ROS                            Anesthesia Physical Anesthesia Plan  ASA: 2  Anesthesia Plan: General   Post-op Pain Management: Tylenol PO (pre-op)   Induction: Intravenous  PONV Risk Score and Plan: 4 or greater and Ondansetron, Dexamethasone and Treatment may vary due to age or medical condition  Airway Management Planned: LMA  Additional Equipment: None  Intra-op Plan:   Post-operative Plan: Extubation in OR  Informed Consent: I have reviewed the patients History and Physical, chart, labs and discussed the procedure including the risks, benefits and alternatives for the proposed anesthesia with the patient or authorized representative who has indicated his/her  understanding and acceptance.     Dental advisory given  Plan Discussed with: CRNA  Anesthesia Plan Comments:        Anesthesia Quick Evaluation

## 2021-12-22 ENCOUNTER — Other Ambulatory Visit: Payer: Self-pay

## 2021-12-22 ENCOUNTER — Encounter (HOSPITAL_BASED_OUTPATIENT_CLINIC_OR_DEPARTMENT_OTHER)
Admission: RE | Disposition: A | Discharge status: Home / Self Care | Payer: Self-pay | Source: Home / Self Care | Attending: Surgery

## 2021-12-22 ENCOUNTER — Ambulatory Visit (HOSPITAL_BASED_OUTPATIENT_CLINIC_OR_DEPARTMENT_OTHER): Payer: Medicare Other | Admitting: Anesthesiology

## 2021-12-22 ENCOUNTER — Encounter (HOSPITAL_BASED_OUTPATIENT_CLINIC_OR_DEPARTMENT_OTHER): Payer: Self-pay | Admitting: Surgery

## 2021-12-22 ENCOUNTER — Ambulatory Visit (HOSPITAL_BASED_OUTPATIENT_CLINIC_OR_DEPARTMENT_OTHER)
Admission: RE | Admit: 2021-12-22 | Discharge: 2021-12-22 | Disposition: A | Discharge status: Home / Self Care | Payer: Medicare Other | Attending: Surgery | Admitting: Surgery

## 2021-12-22 DIAGNOSIS — K621 Rectal polyp: Secondary | ICD-10-CM | POA: Diagnosis not present

## 2021-12-22 DIAGNOSIS — K643 Fourth degree hemorrhoids: Secondary | ICD-10-CM | POA: Diagnosis not present

## 2021-12-22 DIAGNOSIS — A630 Anogenital (venereal) warts: Secondary | ICD-10-CM | POA: Insufficient documentation

## 2021-12-22 DIAGNOSIS — Z853 Personal history of malignant neoplasm of breast: Secondary | ICD-10-CM | POA: Insufficient documentation

## 2021-12-22 DIAGNOSIS — Z7901 Long term (current) use of anticoagulants: Secondary | ICD-10-CM | POA: Insufficient documentation

## 2021-12-22 DIAGNOSIS — K644 Residual hemorrhoidal skin tags: Secondary | ICD-10-CM | POA: Diagnosis not present

## 2021-12-22 DIAGNOSIS — M7989 Other specified soft tissue disorders: Secondary | ICD-10-CM | POA: Diagnosis not present

## 2021-12-22 DIAGNOSIS — K648 Other hemorrhoids: Secondary | ICD-10-CM | POA: Insufficient documentation

## 2021-12-22 DIAGNOSIS — K641 Second degree hemorrhoids: Secondary | ICD-10-CM | POA: Diagnosis not present

## 2021-12-22 DIAGNOSIS — K642 Third degree hemorrhoids: Secondary | ICD-10-CM | POA: Diagnosis not present

## 2021-12-22 DIAGNOSIS — K6289 Other specified diseases of anus and rectum: Secondary | ICD-10-CM | POA: Diagnosis not present

## 2021-12-22 HISTORY — DX: Hyperlipidemia, unspecified: E78.5

## 2021-12-22 HISTORY — DX: Personal history of irradiation: Z92.3

## 2021-12-22 HISTORY — DX: Dermatitis, unspecified: L30.9

## 2021-12-22 HISTORY — DX: Presence of dental prosthetic device (complete) (partial): Z97.2

## 2021-12-22 HISTORY — DX: Other complications of anesthesia, initial encounter: T88.59XA

## 2021-12-22 HISTORY — PX: LASER ABLATION CONDOLAMATA: SHX5941

## 2021-12-22 HISTORY — DX: Other specified disorders of bone density and structure, unspecified site: M85.80

## 2021-12-22 HISTORY — PX: RECTAL EXAM UNDER ANESTHESIA: SHX6399

## 2021-12-22 HISTORY — DX: Anogenital (venereal) warts: A63.0

## 2021-12-22 LAB — POCT I-STAT, CHEM 8
BUN: 16 mg/dL (ref 8–23)
Calcium, Ion: 1.26 mmol/L (ref 1.15–1.40)
Chloride: 102 mmol/L (ref 98–111)
Creatinine, Ser: 0.7 mg/dL (ref 0.44–1.00)
Glucose, Bld: 111 mg/dL — ABNORMAL HIGH (ref 70–99)
HCT: 40 % (ref 36.0–46.0)
Hemoglobin: 13.6 g/dL (ref 12.0–15.0)
Potassium: 3.4 mmol/L — ABNORMAL LOW (ref 3.5–5.1)
Sodium: 142 mmol/L (ref 135–145)
TCO2: 28 mmol/L (ref 22–32)

## 2021-12-22 SURGERY — ABLATION, CONDYLOMA, USING LASER
Anesthesia: General | Site: Renal

## 2021-12-22 MED ORDER — OXYCODONE HCL 5 MG PO TABS: 5.0000 mg | ORAL_TABLET | Freq: Four times a day (QID) | ORAL | 0 refills | Status: DC | PRN

## 2021-12-22 MED ORDER — CEFTRIAXONE SODIUM 2 G IJ SOLR
INTRAMUSCULAR | Status: AC
  Filled 2021-12-22: qty 20

## 2021-12-22 MED ORDER — GABAPENTIN 300 MG PO CAPS
ORAL_CAPSULE | ORAL | Status: AC
  Filled 2021-12-22: qty 1

## 2021-12-22 MED ORDER — DEXAMETHASONE SODIUM PHOSPHATE 10 MG/ML IJ SOLN
INTRAMUSCULAR | Status: DC | PRN
  Administered 2021-12-22: 5 mg via INTRAVENOUS

## 2021-12-22 MED ORDER — ACETAMINOPHEN 500 MG PO TABS
1000.0000 mg | ORAL_TABLET | ORAL | Status: AC
  Administered 2021-12-22: 1000 mg via ORAL

## 2021-12-22 MED ORDER — KETOROLAC TROMETHAMINE 30 MG/ML IJ SOLN
INTRAMUSCULAR | Status: DC | PRN
  Administered 2021-12-22: 30 mg via INTRAVENOUS

## 2021-12-22 MED ORDER — SODIUM CHLORIDE 0.9 % IV SOLN: INTRAVENOUS | Status: DC

## 2021-12-22 MED ORDER — FENTANYL CITRATE (PF) 100 MCG/2ML IJ SOLN: 25.0000 ug | INTRAMUSCULAR | Status: DC | PRN

## 2021-12-22 MED ORDER — FENTANYL CITRATE (PF) 250 MCG/5ML IJ SOLN
INTRAMUSCULAR | Status: AC
  Filled 2021-12-22: qty 5

## 2021-12-22 MED ORDER — CHLORHEXIDINE GLUCONATE CLOTH 2 % EX PADS: 6.0000 | MEDICATED_PAD | Freq: Once | CUTANEOUS | Status: DC

## 2021-12-22 MED ORDER — ACETAMINOPHEN 10 MG/ML IV SOLN
INTRAVENOUS | Status: AC
  Filled 2021-12-22: qty 100

## 2021-12-22 MED ORDER — SODIUM CHLORIDE 0.9 % IV SOLN: 250.0000 mL | INTRAVENOUS | Status: DC | PRN

## 2021-12-22 MED ORDER — ONDANSETRON HCL 4 MG/2ML IJ SOLN: 4.0000 mg | Freq: Once | INTRAMUSCULAR | Status: DC | PRN

## 2021-12-22 MED ORDER — ACETIC ACID 5 % SOLN
Status: DC | PRN
  Administered 2021-12-22: 1 via TOPICAL

## 2021-12-22 MED ORDER — SODIUM CHLORIDE 0.9 % IV SOLN
2.0000 g | INTRAVENOUS | Status: AC
  Administered 2021-12-22: 2 g via INTRAVENOUS

## 2021-12-22 MED ORDER — FENTANYL CITRATE (PF) 100 MCG/2ML IJ SOLN
INTRAMUSCULAR | Status: DC | PRN
Start: 2021-12-22 — End: 2021-12-22
  Administered 2021-12-22 (×2): 25 ug via INTRAVENOUS
  Administered 2021-12-22: 50 ug via INTRAVENOUS

## 2021-12-22 MED ORDER — LIDOCAINE HCL (PF) 2 % IJ SOLN
INTRAMUSCULAR | Status: AC
  Filled 2021-12-22: qty 5

## 2021-12-22 MED ORDER — BUPIVACAINE-EPINEPHRINE 0.25% -1:200000 IJ SOLN
INTRAMUSCULAR | Status: DC | PRN
  Administered 2021-12-22: 20 mL

## 2021-12-22 MED ORDER — DEXAMETHASONE SODIUM PHOSPHATE 10 MG/ML IJ SOLN
INTRAMUSCULAR | Status: AC
  Filled 2021-12-22: qty 1

## 2021-12-22 MED ORDER — KETOROLAC TROMETHAMINE 30 MG/ML IJ SOLN
INTRAMUSCULAR | Status: AC
  Filled 2021-12-22: qty 1

## 2021-12-22 MED ORDER — ACETAMINOPHEN 10 MG/ML IV SOLN
INTRAVENOUS | Status: DC | PRN
  Administered 2021-12-22: 1000 mg via INTRAVENOUS

## 2021-12-22 MED ORDER — PHENYLEPHRINE HCL (PRESSORS) 10 MG/ML IV SOLN
INTRAVENOUS | Status: DC | PRN
  Administered 2021-12-22 (×3): 120 ug via INTRAVENOUS
  Administered 2021-12-22 (×4): 80 ug via INTRAVENOUS

## 2021-12-22 MED ORDER — ACETAMINOPHEN 500 MG PO TABS
ORAL_TABLET | ORAL | Status: AC
  Filled 2021-12-22: qty 2

## 2021-12-22 MED ORDER — ENSURE PRE-SURGERY PO LIQD: 296.0000 mL | Freq: Once | ORAL | Status: DC

## 2021-12-22 MED ORDER — ROCURONIUM BROMIDE 10 MG/ML (PF) SYRINGE
PREFILLED_SYRINGE | INTRAVENOUS | Status: AC
  Filled 2021-12-22: qty 10

## 2021-12-22 MED ORDER — STERILE WATER FOR IRRIGATION IR SOLN
Status: DC | PRN
  Administered 2021-12-22: 1000 mL

## 2021-12-22 MED ORDER — PROPOFOL 10 MG/ML IV BOLUS
INTRAVENOUS | Status: AC
  Filled 2021-12-22: qty 20

## 2021-12-22 MED ORDER — PROPOFOL 10 MG/ML IV BOLUS
INTRAVENOUS | Status: DC | PRN
  Administered 2021-12-22: 100 mg via INTRAVENOUS
  Administered 2021-12-22: 30 mg via INTRAVENOUS
  Administered 2021-12-22: 20 mg via INTRAVENOUS

## 2021-12-22 MED ORDER — BUPIVACAINE LIPOSOME 1.3 % IJ SUSP
INTRAMUSCULAR | Status: DC | PRN
  Administered 2021-12-22: 20 mL

## 2021-12-22 MED ORDER — ONDANSETRON HCL 4 MG/2ML IJ SOLN
INTRAMUSCULAR | Status: DC | PRN
  Administered 2021-12-22: 4 mg via INTRAVENOUS

## 2021-12-22 MED ORDER — SODIUM CHLORIDE 0.9% FLUSH: 3.0000 mL | INTRAVENOUS | Status: DC | PRN

## 2021-12-22 MED ORDER — WHITE PETROLATUM EX OINT
TOPICAL_OINTMENT | CUTANEOUS | Status: AC
  Filled 2021-12-22: qty 5

## 2021-12-22 MED ORDER — PHENYLEPHRINE 40 MCG/ML (10ML) SYRINGE FOR IV PUSH (FOR BLOOD PRESSURE SUPPORT)
PREFILLED_SYRINGE | INTRAVENOUS | Status: AC
  Filled 2021-12-22: qty 20

## 2021-12-22 MED ORDER — ONDANSETRON HCL 4 MG/2ML IJ SOLN
INTRAMUSCULAR | Status: AC
  Filled 2021-12-22: qty 2

## 2021-12-22 MED ORDER — LIDOCAINE 2% (20 MG/ML) 5 ML SYRINGE
INTRAMUSCULAR | Status: DC | PRN
Start: 2021-12-22 — End: 2021-12-22
  Administered 2021-12-22: 40 mg via INTRAVENOUS

## 2021-12-22 MED ORDER — SODIUM CHLORIDE 0.9 % IV SOLN
INTRAVENOUS | Status: AC
  Filled 2021-12-22: qty 100

## 2021-12-22 MED ORDER — DIAZEPAM 5 MG PO TABS: 5.0000 mg | ORAL_TABLET | Freq: Three times a day (TID) | ORAL | 1 refills | Status: DC | PRN

## 2021-12-22 MED ORDER — SODIUM CHLORIDE 0.9% FLUSH: 3.0000 mL | Freq: Two times a day (BID) | INTRAVENOUS | Status: DC

## 2021-12-22 MED ORDER — GABAPENTIN 300 MG PO CAPS
300.0000 mg | ORAL_CAPSULE | ORAL | Status: AC
  Administered 2021-12-22: 300 mg via ORAL

## 2021-12-22 MED ORDER — 0.9 % SODIUM CHLORIDE (POUR BTL) OPTIME
TOPICAL | Status: DC | PRN
  Administered 2021-12-22: 500 mL

## 2021-12-22 MED ORDER — BUPIVACAINE LIPOSOME 1.3 % IJ SUSP: 20.0000 mL | Freq: Once | INTRAMUSCULAR | Status: DC

## 2021-12-22 SURGICAL SUPPLY — 58 items
APL SKNCLS STERI-STRIP NONHPOA (GAUZE/BANDAGES/DRESSINGS)
BENZOIN TINCTURE PRP APPL 2/3 (GAUZE/BANDAGES/DRESSINGS) IMPLANT
BLADE CLIPPER SENSICLIP SURGIC (BLADE) IMPLANT
BLADE SURG 10 STRL SS (BLADE) IMPLANT
BLADE SURG 15 STRL LF DISP TIS (BLADE) ×2 IMPLANT
BLADE SURG 15 STRL SS (BLADE) ×3
CANISTER SUCT 1200ML W/VALVE (MISCELLANEOUS) ×2 IMPLANT
COVER BACK TABLE 60X90IN (DRAPES) ×3 IMPLANT
COVER MAYO STAND STRL (DRAPES) ×3 IMPLANT
DECANTER SPIKE VIAL GLASS SM (MISCELLANEOUS) IMPLANT
DRAPE HYSTEROSCOPY (MISCELLANEOUS) ×3 IMPLANT
DRAPE LAPAROTOMY 100X72 PEDS (DRAPES) ×2 IMPLANT
DRAPE SHEET LG 3/4 BI-LAMINATE (DRAPES) ×1 IMPLANT
DRSG PAD ABDOMINAL 8X10 ST (GAUZE/BANDAGES/DRESSINGS) ×5 IMPLANT
ELECT NDL TIP 2.8 STRL (NEEDLE) ×2 IMPLANT
ELECT NEEDLE TIP 2.8 STRL (NEEDLE) ×3 IMPLANT
ELECT REM PT RETURN 9FT ADLT (ELECTROSURGICAL) ×3
ELECTRODE REM PT RTRN 9FT ADLT (ELECTROSURGICAL) ×2 IMPLANT
GAUZE 4X4 16PLY ~~LOC~~+RFID DBL (SPONGE) ×3 IMPLANT
GAUZE SPONGE 4X4 12PLY STRL (GAUZE/BANDAGES/DRESSINGS) ×1 IMPLANT
GLOVE SRG 8 PF TXTR STRL LF DI (GLOVE) ×2 IMPLANT
GLOVE SURG LTX SZ8 (GLOVE) ×3 IMPLANT
GLOVE SURG UNDER POLY LF SZ8 (GLOVE) ×3
GOWN STRL REUS W/TWL XL LVL3 (GOWN DISPOSABLE) ×3 IMPLANT
IV CATH 14GX2 1/4 (CATHETERS) IMPLANT
IV CATH PLACEMENT 20 GA (IV SOLUTION) IMPLANT
KIT SIGMOIDOSCOPE (SET/KITS/TRAYS/PACK) IMPLANT
KIT TURNOVER CYSTO (KITS) ×3 IMPLANT
LEGGING LITHOTOMY PAIR STRL (DRAPES) ×1 IMPLANT
LOOP VESSEL MAXI BLUE (MISCELLANEOUS) IMPLANT
NEEDLE HYPO 22GX1.5 SAFETY (NEEDLE) ×3 IMPLANT
NS IRRIG 500ML POUR BTL (IV SOLUTION) ×1 IMPLANT
PACK BASIN DAY SURGERY FS (CUSTOM PROCEDURE TRAY) ×3 IMPLANT
PAD PREP 24X48 CUFFED NSTRL (MISCELLANEOUS) ×3 IMPLANT
PANTS MESH DISP LRG (UNDERPADS AND DIAPERS) ×2 IMPLANT
PANTS MESH DISPOSABLE L (UNDERPADS AND DIAPERS) ×1
PENCIL SMOKE EVACUATOR (MISCELLANEOUS) ×3 IMPLANT
SURGILUBE 2OZ TUBE FLIPTOP (MISCELLANEOUS) ×3 IMPLANT
SUT CHROMIC 2 0 SH (SUTURE) ×3 IMPLANT
SUT CHROMIC 3 0 SH 27 (SUTURE) ×3 IMPLANT
SUT ETHIBOND 0 (SUTURE) IMPLANT
SUT MNCRL AB 4-0 PS2 18 (SUTURE) IMPLANT
SUT PROLENE 2 0 SH DA (SUTURE) IMPLANT
SUT VIC AB 2-0 UR6 27 (SUTURE) ×6 IMPLANT
SUT VIC AB 3-0 SH 18 (SUTURE) IMPLANT
SWAB COLLECTION DEVICE MRSA (MISCELLANEOUS) IMPLANT
SWAB CULTURE ESWAB REG 1ML (MISCELLANEOUS) IMPLANT
SYR 20ML LL LF (SYRINGE) ×3 IMPLANT
SYR BULB IRRIG 60ML STRL (SYRINGE) IMPLANT
SYR CONTROL 10ML LL (SYRINGE) ×1 IMPLANT
TAPE CLOTH 3X10 TAN LF (GAUZE/BANDAGES/DRESSINGS) ×3 IMPLANT
TOWEL OR 17X26 10 PK STRL BLUE (TOWEL DISPOSABLE) ×5 IMPLANT
TRAY DSU PREP LF (CUSTOM PROCEDURE TRAY) ×3 IMPLANT
TUBE CONNECTING 12X1/4 (SUCTIONS) ×3 IMPLANT
UNDERPAD 30X36 HEAVY ABSORB (UNDERPADS AND DIAPERS) ×3 IMPLANT
VACUUM HOSE 7/8X10 W/ WAND (MISCELLANEOUS) ×3 IMPLANT
WATER STERILE IRR 500ML POUR (IV SOLUTION) ×3 IMPLANT
YANKAUER SUCT BULB TIP NO VENT (SUCTIONS) ×6 IMPLANT

## 2021-12-22 NOTE — Progress Notes (Signed)
Patient requesting pain meds for "pressure in bottom."  Unable to rate on pain scale.  Dr Doroteo Glassman with anesthesia contacted re: pain pill before going home.  Order received to give one pain pill ordered by Dr Johney Maine for home pain control. Chart states pt has anaphylactic reaction to codeine and flags Hydrocodone as contraindicated.  Dr Johney Maine notified and he stated that patient needs narcotic for pain control after having hemorrhoid surgery and that he specifically ordered oxycodone.  He is aware of allergies.    Back in patient's room to question her about anaphylaxis.  She states that she only remembers having dizziness and nausea after taking pain meds in the past.  Does not recall any history of anaphylaxis or any severe reaction in the past.  No longer wants pain meds.    Patient taken to the restroom to void.  Able to clean herself afterward without complaints.  Patient did not appear to be in any discomfort.

## 2021-12-22 NOTE — Discharge Instructions (Addendum)
##############################################  ANORECTAL SURGERY:  POST OPERATIVE INSTRUCTIONS  ######################################################################  EAT Start with a pureed / full liquid diet After 24 hours, gradually transition to a high fiber diet.    CONTROL PAIN Control pain so you can tolerate bowel movements,  walk, sleep, tolerate sneezing/coughing, and go up/down stairs.   HAVE A BOWEL MOVEMENT DAILY Keep your bowels regular to avoid problems.   Taking a fiber supplement every day to keep bowels soft.   Try a laxative to override constipation. Use an antidairrheal to slow down diarrhea.   Call if not better after 2 tries  WALK Walk an hour a day.  Control your pain to do that.   CALL IF YOU HAVE PROBLEMS/CONCERNS Call if you are still struggling despite following these instructions. Call if you have concerns not answered by these instructions  ######################################################################    Take your usually prescribed home medications unless otherwise directed.  DIET: Follow a light bland diet & liquids the first 24 hours after arrival home, such as soup, liquids, starches, etc.  Be sure to drink plenty of fluids.  Quickly advance to a usual solid diet within a few days.  Avoid fast food or heavy meals as your are more likely to get nauseated or have irregular bowels.  A low-fat, high-fiber diet for the rest of your life is ideal.  PAIN CONTROL: Expect swelling and discomfort in the anus/rectal area. Pain is best controlled by a usual combination of many methods TOGETHER: Warm baths/soaks or Ice packs Over the counter pain medication Prescription pain medications Topical creams    Warm water baths or ice packs (30-60 minutes up to 8 times a day, especially after bowel meovements) will help. Use ice for the first few days to help decrease swelling and bruising, then switch to heat such as warm towels, sitz baths, warm  baths, warm showers, etc to help relax tight/sore spots and speed recovery.  Some people prefer to use ice alone, heat alone, alternating between ice & heat.  Experiment to what works for you.    It is helpful to take an over-the-counter pain medication continuously for the first few weeks.  Choose one of the following that works best for you: Naproxen (Aleve, etc)  Two 220mg  tabs twice a day Ibuprofen (Advil, etc) Three 200mg  tabs four times a day (every meal & bedtime) Acetaminophen (Tylenol, etc) 500-650mg  four times a day (every meal & bedtime)  A  prescription for pain medication (such as oxycodone, hydrocodone, etc) should be given to you upon discharge.  Take your pain medication as prescribed.  If you are having problems/concerns with the prescription medicine (does not control pain, nausea, vomiting, rash, itching, etc), please call us [phone] to see if we need to switch you to a different pain medicine that will work better for you and/or control your side effect better. If you need a refill on your pain medication, please contact your pharmacy.  They will contact our office to request authorization. Prescriptions will not be filled after 5 pm or on week-ends.  If can take up to 48 hours for it to be filled & ready so avoid waiting until you are down to thel ast pill.  A topical cream (Dibucaine) or a prescription for a cream (such as diltiazem 2% gel) may be given to you.  Many people find relief with topical creams.  Some people find it burns too much.  Experiment.  If it helps, use it.  If it burns, don't  using it.  You also may receive a prescription for diazepam, a muscle relaxant to help you to be able to urinate and defecate more easily.  It is safe to take a few doses with the other medications as long as you are not planning to drive or do anything intense.  Hopefully this can minimize the chance of needing a Foley catheter into your bladder     KEEP YOUR BOWELS  REGULAR The goal is one soft bowel movement a day Avoid getting constipated.  Between the surgery and the pain medications, it is common to experience some constipation.  Increasing fluid intake and taking a fiber supplement (such as Metamucil, Citrucel, FiberCon, MiraLax, etc) 2-4 times a day regularly will usually help prevent this problem from occurring.  A mild laxative (prune juice, Milk of Magnesia, MiraLax, etc) should be taken according to package directions if there are no bowel movements after 48 hours. Watch out for diarrhea.  If you have many loose bowel movements, simplify your diet to bland foods & liquids for a few days.  Stop any stool softeners and decrease your fiber supplement.  Switching to mild anti-diarrheal medications (Kayopectate, Pepto Bismol) can help.  Can try an imodium/loperamide dose.  If this worsens or does not improve, please call us.  Wound Care   a. You have some fluffed gauze on top of the anus to help catch drainage and bleeding.  Let the gauze fall off with the first bowel movement or shower.  It is okay to reinforce or replace as needed.  Bleeding is common at first and occasionally tapers off   b. Place soft cotton balls on the anus/wounds and use an absorbent pad in your underwear as needed to catch any drainage and help keep the area.  Try to use cotton over regular gauze as Kolls can stick and pull, causing pain.  Cotton will come off more easily.   c. Keep the area clean and dry.  Bathe / shower every day.  Keep the area clean by showering / bathing over the incision / wound.   It is okay to soak an open wound to help wash it.  Consider using a squeeze bottle filled with warm water to gently wash the anal area.  Wet wipes or showers / gentle washing after bowel movements is often less traumatic than regular toilet paper.  Use a Sitz Bath 4-8 times a day for relief  A sitz bath is a warm water bath taken in the sitting position that covers only the hips and  buttocks. It may be used for either healing or hygiene purposes. Sitz baths are also used to relieve pain, itching, or muscle spasms.  Gently cleaned the area and the heat will help lower spasm and offer better pain control.    Fill the bathtub half full with warm water. Sit in the water and open the drain a little. Turn on the warm water to keep the tub half full. Keep the water running constantly. Soak in the water for 15 to 20 minutes. After the sitz bath, pat the affected area dry first.   d. You will often notice bleeding, especially with bowel movements.  This should slow down by the end of the first week of surgery.  Sitting on an ice pack can help.   e. Expect some drainage.  You often will have some blood or yellow drainage with open wounds.  Sometimes she will get a little leaking of liquid stool until the  incision/wounds have fully close down.  This should slow down by the end of the first week of surgery, but you will have occasional bleeding or drainage up to a few months after surgery.  Wear an absorbent pad or soft cotton gauze in your underwear until the drainage stops.  ACTIVITIES as tolerated:    You may resume regular (light) daily activities beginning the next day--such as daily self-care, walking, climbing stairs--gradually increasing activities as tolerated.  If you can walk 30 minutes without difficulty, it is safe to try more intense activity such as jogging, treadmill, bicycling, low-impact aerobics, swimming, etc. Save the most intensive and strenuous activity for last such as sit-ups, heavy lifting, contact sports, etc  Refrain from any heavy lifting or straining until you are off narcotics for pain control.   DO NOT PUSH THROUGH PAIN.  Let pain be your guide: If it hurts to do something, don't do it.  Pain is your body warning you to avoid that activity for another week until the pain goes down. You may drive when you are no longer taking prescription pain medication, you  can comfortably sit for long periods of time, and you can safely maneuver your car and apply brakes. You may have sexual intercourse when it is comfortable.   FOLLOW UP in our office Please call [facility] at [phone] to set up an appointment to see your surgeon in the office for a follow-up appointment approximately 2-3 weeks after your surgery. Make sure that you call for this appointment the day you arrive home to ensure a convenient appointment time.  8. IF YOU HAVE DISABILITY OR FAMILY LEAVE FORMS, BRING THEM TO THE OFFICE FOR PROCESSING.  DO NOT GIVE THEM TO YOUR DOCTOR.        WHEN TO CALL us [phone]: Poor pain control Reactions / problems with new medications (rash/itching, nausea, etc)  Fever over 101.5 F (38.5 C) Inability to urinate Nausea and/or vomiting Worsening swelling or bruising Continued bleeding from incision. Increased pain, redness, or drainage from the incision  The clinic staff is available to answer your questions during regular business hours (8:30am-5pm).  Please dont hesitate to call and ask to speak to one of our nurses for clinical concerns.   A surgeon from [facility] is always on call at the hospitals   If you have a medical emergency, go to the nearest emergency room or call 911.    [facility], [place], [place], [place], [place]  [address] ? MAIN: [phone] ? TOLL FREE: [phone] ? FAX [phone] [email or web address]  #####################################################     No acetaminophen/Tylenol until after 10:15pm today if needed for pain.    No ibuprofen, Advil, Aleve, Motrin, ketorolac, meloxicam, naproxen, or other NSAIDS until after 2:20pm today if needed for pain.      Information for Discharge Teaching: EXPAREL (bupivacaine liposome injectable suspension)   Your surgeon or anesthesiologist gave you EXPAREL(bupivacaine) to help control your pain  after surgery.  EXPAREL is a local anesthetic that provides pain relief by numbing the tissue around the surgical site. EXPAREL is designed to release pain medication over time and can control pain for up to 72 hours. Depending on how you respond to EXPAREL, you may require less pain medication during your recovery.  Possible side effects: Temporary loss of sensation or ability to move in the area where bupivacaine was injected. Nausea, vomiting, constipation Rarely, numbness and tingling in your mouth or lips, lightheadedness,  or anxiety may occur. Call your doctor right away if you think you may be experiencing any of these sensations, or if you have other questions regarding possible side effects.  Follow all other discharge instructions given to you by your surgeon or nurse. Eat a healthy diet and drink plenty of water or other fluids.  If you return to the hospital for any reason within 96 hours following the administration of EXPAREL, it is important for health care providers to know that you have received this anesthetic. A teal colored band has been placed on your arm with the date, time and amount of EXPAREL you have received in order to alert and inform your health care providers. Please leave this armband in place for the full 96 hours following administration, and then you may remove the band.   Band can be removed on Tuesday December 26, 2021.      Post Anesthesia Home Care Instructions  Activity: Get plenty of rest for the remainder of the day. A responsible individual must stay with you for 24 hours following the procedure.  For the next 24 hours, DO NOT: -Drive a car -Paediatric nurse -Drink alcoholic beverages -Take any medication unless instructed by your physician -Make any legal decisions or sign important papers.  Meals: Start with liquid foods such as gelatin or soup. Progress to regular foods as tolerated. Avoid greasy, spicy, heavy foods. If nausea and/or  vomiting occur, drink only clear liquids until the nausea and/or vomiting subsides. Call your physician if vomiting continues.  Special Instructions/Symptoms: Your throat may feel dry or sore from the anesthesia or the breathing tube placed in your throat during surgery. If this causes discomfort, gargle with warm salt water. The discomfort should disappear within 24 hours.
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
## 2021-12-22 NOTE — Anesthesia Postprocedure Evaluation (Signed)
Anesthesia Post Note  Patient: AMILA CALLIES  Procedure(s) Performed: LASER REMOVAL/ABLATION CONDOLAMATA (Renal) ANORECTAL EXAM UNDER ANESTHESIA excision of perirectal mass  Hemorrhoidal ligation and removal (Rectum)     Patient location during evaluation: PACU Anesthesia Type: General Level of consciousness: awake and alert, oriented and patient cooperative Pain management: pain level controlled Vital Signs Assessment: post-procedure vital signs reviewed and stable Respiratory status: spontaneous breathing, nonlabored ventilation and respiratory function stable Cardiovascular status: blood pressure returned to baseline and stable Postop Assessment: no apparent nausea or vomiting Anesthetic complications: no   No notable events documented.  Last Vitals:  Vitals:   12/22/21 0923 12/22/21 1006  BP: 136/71 138/72  Pulse: 81 84  Resp: 14 16  Temp:  (!) 36.4 C  SpO2: 95% 98%    Last Pain:  Vitals:   12/22/21 0923  TempSrc:   PainSc: 0-No pain                 Pervis Hocking
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
## 2021-12-22 NOTE — Transfer of Care (Signed)
Immediate Anesthesia Transfer of Care Note  Patient: Lori Joseph  Procedure(s) Performed: Procedure(s) (LRB): LASER REMOVAL/ABLATION CONDOLAMATA (N/A) ANORECTAL EXAM UNDER ANESTHESIA excision of perirectal mass  Hemorrhoidal ligation and removal (N/A)  Patient Location: PACU  Anesthesia Type: General  Level of Consciousness: awake, oriented, sedated and patient cooperative  Airway & Oxygen Therapy: Patient Spontanous Breathing and Patient connected to face mask oxygen  Post-op Assessment: Report given to PACU RN and Post -op Vital signs reviewed and stable  Post vital signs: Reviewed and stable  Complications: No apparent anesthesia complications Last Vitals:  Vitals Value Taken Time  BP 123/94 12/22/21 0836  Temp 36.3 C 12/22/21 0836  Pulse 74 12/22/21 0841  Resp 19 12/22/21 0841  SpO2 100 % 12/22/21 0841  Vitals shown include unvalidated device data.  Last Pain:  Vitals:   12/22/21 0542  TempSrc: Oral      Patients Stated Pain Goal: 5 (95/62/13 0865)  Complications: No notable events documented.

## 2021-12-22 NOTE — Anesthesia Procedure Notes (Signed)
Procedure Name: LMA Insertion Date/Time: 12/22/2021 7:27 AM Performed by: Rogers Blocker, CRNA Pre-anesthesia Checklist: Patient identified, Emergency Drugs available, Suction available and Patient being monitored Patient Re-evaluated:Patient Re-evaluated prior to induction Oxygen Delivery Method: Circle System Utilized Preoxygenation: Pre-oxygenation with 100% oxygen Induction Type: IV induction Ventilation: Mask ventilation without difficulty LMA: LMA inserted LMA Size: 4.0 Number of attempts: 1 Placement Confirmation: positive ETCO2 Tube secured with: Tape Dental Injury: Teeth and Oropharynx as per pre-operative assessment

## 2021-12-25 ENCOUNTER — Encounter (HOSPITAL_BASED_OUTPATIENT_CLINIC_OR_DEPARTMENT_OTHER): Payer: Self-pay | Admitting: Surgery

## 2021-12-25 LAB — SURGICAL PATHOLOGY

## 2022-01-01 ENCOUNTER — Ambulatory Visit (INDEPENDENT_AMBULATORY_CARE_PROVIDER_SITE_OTHER): Payer: Medicare Other | Admitting: Bariatrics

## 2022-01-05 ENCOUNTER — Telehealth: Payer: Self-pay | Admitting: Internal Medicine

## 2022-01-05 NOTE — Telephone Encounter (Signed)
Pt contacted ov and was advised awv appointment is needed. Pt stated she does not want to schedule appointment at the moment due to her just getting out of surgery.

## 2022-01-05 NOTE — Telephone Encounter (Signed)
Copied from [place] #[id number]. Topic: Medicare AWV >> Jan 05, 2022  9:34 AM Cher Nakai R wrote: Reason for CRM:  Left message for patient to call back and schedule Medicare Annual Wellness Visit (AWV) in office.   If not able to come in office, please offer to do virtually or by telephone.   Last AWV:  01/01/2017  Please schedule at anytime with [facility].

## 2022-01-13 ENCOUNTER — Other Ambulatory Visit: Payer: Self-pay | Admitting: Internal Medicine

## 2022-01-15 ENCOUNTER — Other Ambulatory Visit: Payer: Self-pay | Admitting: Internal Medicine

## 2022-01-25 ENCOUNTER — Encounter: Payer: Self-pay | Admitting: Family

## 2022-01-25 ENCOUNTER — Ambulatory Visit (INDEPENDENT_AMBULATORY_CARE_PROVIDER_SITE_OTHER): Payer: Medicare Other | Admitting: Family

## 2022-01-25 ENCOUNTER — Ambulatory Visit: Payer: Medicare Other | Admitting: Cardiovascular Disease

## 2022-01-25 VITALS — BP 130/80 | HR 67 | Temp 98.0°F | Ht 63.0 in | Wt 197.0 lb

## 2022-01-25 DIAGNOSIS — R42 Dizziness and giddiness: Secondary | ICD-10-CM

## 2022-01-25 DIAGNOSIS — R300 Dysuria: Secondary | ICD-10-CM | POA: Diagnosis not present

## 2022-01-25 LAB — CBC WITH DIFFERENTIAL/PLATELET
Basophils Absolute: 0.1 10*3/uL (ref 0.0–0.1)
Basophils Relative: 1.2 % (ref 0.0–3.0)
Eosinophils Absolute: 0.1 10*3/uL (ref 0.0–0.7)
Eosinophils Relative: 1.9 % (ref 0.0–5.0)
HCT: 37.4 % (ref 36.0–46.0)
Hemoglobin: 12.6 g/dL (ref 12.0–15.0)
Lymphocytes Relative: 28.8 % (ref 12.0–46.0)
Lymphs Abs: 1.3 10*3/uL (ref 0.7–4.0)
MCHC: 33.6 g/dL (ref 30.0–36.0)
MCV: 94 fl (ref 78.0–100.0)
Monocytes Absolute: 0.3 10*3/uL (ref 0.1–1.0)
Monocytes Relative: 6.8 % (ref 3.0–12.0)
Neutro Abs: 2.7 10*3/uL (ref 1.4–7.7)
Neutrophils Relative %: 61.3 % (ref 43.0–77.0)
Platelets: 154 10*3/uL (ref 150.0–400.0)
RBC: 3.98 Mil/uL (ref 3.87–5.11)
RDW: 14.4 % (ref 11.5–15.5)
WBC: 4.4 10*3/uL (ref 4.0–10.5)

## 2022-01-25 LAB — POCT URINALYSIS DIP (MANUAL ENTRY)
Blood, UA: NEGATIVE
Glucose, UA: NEGATIVE mg/dL
Nitrite, UA: NEGATIVE
Protein Ur, POC: NEGATIVE mg/dL
Spec Grav, UA: 1.02 (ref 1.010–1.025)
Urobilinogen, UA: 0.2 E.U./dL
pH, UA: 6 (ref 5.0–8.0)

## 2022-01-25 LAB — COMPREHENSIVE METABOLIC PANEL
ALT: 11 U/L (ref 0–35)
AST: 16 U/L (ref 0–37)
Albumin: 3.9 g/dL (ref 3.5–5.2)
Alkaline Phosphatase: 73 U/L (ref 39–117)
BUN: 20 mg/dL (ref 6–23)
CO2: 29 mEq/L (ref 19–32)
Calcium: 9 mg/dL (ref 8.4–10.5)
Chloride: 104 mEq/L (ref 96–112)
Creatinine, Ser: 0.87 mg/dL (ref 0.40–1.20)
GFR: 62.79 mL/min (ref >60.00)
Glucose, Bld: 95 mg/dL (ref 70–99)
Potassium: 3.9 mEq/L (ref 3.5–5.1)
Sodium: 141 mEq/L (ref 135–145)
Total Bilirubin: 0.8 mg/dL (ref 0.2–1.2)
Total Protein: 6.1 g/dL (ref 6.0–8.3)

## 2022-01-25 MED ORDER — SULFAMETHOXAZOLE-TRIMETHOPRIM 800-160 MG PO TABS: 1.0000 | ORAL_TABLET | Freq: Two times a day (BID) | ORAL | 0 refills | Status: DC

## 2022-01-25 NOTE — Progress Notes (Signed)
Lori Joseph is a 81 y.o. female with the following history as recorded in EpicCare:  Patient Active Problem List   Diagnosis Date Noted   Elevated LFTs 09/14/2021   Vitamin D deficiency 09/14/2021   Prediabetes 09/14/2021   Atrial fibrillation ([place]).Onset 03/2017 04/26/2017   Pulmonary infiltrates on CXR 01/30/2016   Upper airway cough syndrome 01/30/2016   PCP NOTES >>>>>>>>>>>>>>>> 08/25/2015   Hypothyroidism 05/31/2015   Family history of malignant neoplasm of breast 03/11/2015   Family history of malignant neoplasm of ovary 03/11/2015   Breast cancer ([place]) 03/11/2015   PCP comments--R Chest wall and flank pain 12/31/2014   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, 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/2014   Chest pain 12/31/2014   Breast cancer of upper-outer quadrant of left female breast ([place]) 09/08/2014   Eczema 08/05/2014   Annual physical exam >>>>>>>>>>>>>>>>>>>>>>>> 06/18/2012   BCC (basal cell carcinoma of skin) 06/18/2012   OTHER&UNSPECIFIED DISEASES THE ORAL SOFT TISSUES 02/10/2011   HYPERLIPIDEMIA 01/12/2011   OSTEOPENIA 09/28/2009   COLONIC POLYPS, 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     calcium carbonate 1250 MG capsule Take 2,500 mg by mouth every morning.      cholecalciferol (VITAMIN D) 1000 UNITS tablet Take 2,500 Units by mouth every morning.      dorzolamide-timolol (COSOPT) 22.3-6.8 MG/ML ophthalmic solution Place 1 drop into both eyes every morning.      hydrochlorothiazide (HYDRODIURIL) 25 MG tablet TAKE 1/2 TABLET BY MOUTH EVERY DAY 45 tablet 1   latanoprost (XALATAN) 0.005 % ophthalmic solution PLACE 1 DROP INTO BOTH EYES NIGHTLY.     levothyroxine (SYNTHROID) 25 MCG tablet TAKE 1 TABLET BY MOUTH EVERY DAY BEFORE  BREAKFAST 90 tablet 1   meclizine (ANTIVERT) 12.5 MG tablet Take 1 tablet (12.5 mg total) by mouth 2 (two) times daily as needed for dizziness. May cause drowsiness 10 tablet 0   Melatonin 10 MG TABS Take by mouth at bedtime.     metoprolol succinate (TOPROL-XL) 50 MG 24 hr tablet TAKE 1 TABLET BY MOUTH EVERY DAY 90 tablet 1   pravastatin (PRAVACHOL) 40 MG tablet TAKE 1 TABLET BY MOUTH EVERY DAY 90 tablet 1   rivaroxaban (XARELTO) 20 MG TABS tablet Take 1 tablet (20 mg total) by mouth daily with supper. 5 tablet 0   sulfamethoxazole-trimethoprim (BACTRIM DS) 800-160 MG tablet Take 1 tablet by mouth 2 (two) times daily. 10 tablet 0   Multiple Vitamin (MULTIVITAMIN) tablet Take 1 tablet by mouth every morning.     No current facility-administered medications for this visit.    Allergies: Codeine, Hyaluronic acid  [collagen-chond-hyaluronic acid], Carvedilol, Felodipine, Lisinopril, Losartan potassium, Zolpidem, and Radiaplexrx [pyridoxine-zinc picolinate]  Past Medical History:  Diagnosis Date   Anal condyloma Warts    Complication of anesthesia    slow to awaken after colonscopy yrs ago   Eczema    hands/legs   Family history of anesthesia complication    daughter hard to wake up   H/O acute pancreatitis    yrs ago   H/O retinal detachment    History of Anemia    History of COVID-19 10/2021   congestion, diarrhea, flu like symptoms x 2 weeks al symptoms resolved   History of external beam radiation  therapy    11-11-2015  to  12-30-2014   left breast 50.4 gray, lumpectomy cavity boosted to 62.4 gray   Hurthle cell neoplasm of thyroid    Right  [name] thryoid 10-26-2021 epic, biopsy done 12-20-2021   Hyperlipidemia    Hypertension    Hypothyroidism 05/31/2015   Osteopenia    PAF (paroxysmal atrial fibrillation) ([place]) 03/2017   pressure increased in both eyes not glaucoma    Primary cancer of upper outer quadrant of left female breast ([place])    Dr. 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   Seasonal allergies    Vitamin D deficiency    Wears partial dentures upper and lower     Past Surgical History:  Procedure Laterality Date   BREAST BIOPSY Left 08/27/2014   BREAST BIOPSY Left 11/27/2016   BREAST LUMPECTOMY Left 09/15/2014   CHOLECYSTECTOMY  2000   colonscopy  2018   DILATION AND CURETTAGE OF UTERUS     yrs ago   LASER ABLATION CONDOLAMATA N/A 12/22/2021   Procedure: LASER REMOVAL/ABLATION CONDOLAMATA;  Surgeon: Michael Boston, MD;  Location: [facility];  Service: General;  Laterality: N/A;   laser eye surgery, detached retina Left    yrs ago   [place] Left 09/16/2014   Procedure: RADIOACTIVE SEED GUIDED PARTIAL MASTECTOMY WITH AXILLARY SENTINEL LYMPH NODE BIOPSY;  Surgeon: [name], MD;  Location: [facility];  Service: General;  Laterality: Left;   RECTAL EXAM UNDER ANESTHESIA N/A 12/22/2021   Procedure: ANORECTAL EXAM UNDER ANESTHESIA excision of perirectal mass  Hemorrhoidal ligation and removal;  Surgeon: Michael Boston, MD;  Location: [facility];  Service: General;  Laterality: N/A;   THYROID LOBECTOMY N/A 02/10/2015   Procedure: RIGHT THYROID LOBECTOMY;  Surgeon: [name], MD;  Location: [facility];  Service: General;  Laterality: N/A;   TONSILLECTOMY     age 55 adenoids remved also   TUBAL LIGATION     yrs ago    Family History  Problem Relation Age of Onset   Stroke Mother    Hyperlipidemia Mother    Hypertension Mother    Hypertension Father    Cancer Father        lung cancer ; smoker   Breast cancer Sister 63   Kidney cancer Sister 6   Breast cancer Sister 8       passed 25   Melanoma Sister 44   Cancer Brother        3 brothers with lung cancer, all smokers   Cancer Paternal Uncle        2 pat uncles with lung cancer, smokers   Ovarian cancer Other 89       niece with ovarian cancer (related  through sister with breast cancer)   Colon cancer Neg Hx    CAD Neg Hx    Esophageal cancer Neg Hx    Stomach cancer Neg Hx    Rectal cancer Neg Hx     Social History   Tobacco Use   Smoking status: Never   Smokeless tobacco: Never  Substance Use Topics   Alcohol use: No    Alcohol/week: 0.0 standard drinks    Subjective:   Dizzy/ off balance x 3 days; does not feel like "normal vertigo"- room is not spinning; feels off balance; no numbness/ tingling or vision changes; has not fallen;  Does mention that she is recovering  from recent surgery for hemorrhoids and did have to get catheter as part of recovery; denies any burning on urination but does feel that she is not emptying bladder completely;   Known history of A. Fib- stays in A. Fib with rate control for management     Objective:  Vitals:   01/25/22 1103  BP: 130/80  Pulse: 67  Temp: 98 F (36.7 C)  TempSrc: Oral  SpO2: 98%  Weight: 197 lb (89.4 kg)  Height: _0  (1.6 m)    General: Well developed, well nourished, in no acute distress  Skin : Warm and dry.  Head: Normocephalic and atraumatic  Eyes: Sclera and conjunctiva clear; pupils round and reactive to light; extraocular movements intact  Ears: External normal; canals clear; tympanic membranes normal  Oropharynx: Pink, supple. No suspicious lesions  Neck: Supple without thyromegaly, adenopathy  Lungs: Respirations unlabored; clear to auscultation bilaterally without wheeze, rales, rhonchi  CVS exam: Irregularly irregular rhythm Neurologic: Alert and oriented; speech intact; face symmetrical; moves all extremities well; CNII-XII intact without focal deficit   Assessment:  1. Dizziness     Plan:  Update EKG- A. Fib; will check Cbc, CMP and u/a urine culture today; ? Underlying UTI due to recent catheter placement; will go ahead and start Bactrim DS bid x 5 days; follow up to be determined;   This visit occurred during the SARS-CoV-2 public health  emergency.  Safety protocols were in place, including screening questions prior to the visit, additional usage of staff PPE, and extensive cleaning of exam room while observing appropriate contact time as indicated for disinfecting solutions.    No follow-ups on file.  Orders Placed This Encounter  Procedures   Urine Culture   CBC with Differential/Platelet   Comp Met (CMET)   POCT urinalysis dipstick   EKG 12-Lead    Requested Prescriptions   Signed Prescriptions Disp Refills   sulfamethoxazole-trimethoprim (BACTRIM DS) 800-160 MG tablet 10 tablet 0    Sig: Take 1 tablet by mouth 2 (two) times daily.

## 2022-01-26 LAB — URINE CULTURE
MICRO NUMBER:: 13079514
SPECIMEN QUALITY:: ADEQUATE

## 2022-02-08 DIAGNOSIS — D225 Melanocytic nevi of trunk: Secondary | ICD-10-CM | POA: Diagnosis not present

## 2022-02-08 DIAGNOSIS — Z808 Family history of malignant neoplasm of other organs or systems: Secondary | ICD-10-CM | POA: Diagnosis not present

## 2022-02-08 DIAGNOSIS — Z85828 Personal history of other malignant neoplasm of skin: Secondary | ICD-10-CM | POA: Diagnosis not present

## 2022-02-08 DIAGNOSIS — I8393 Asymptomatic varicose veins of bilateral lower extremities: Secondary | ICD-10-CM | POA: Diagnosis not present

## 2022-02-08 DIAGNOSIS — L814 Other melanin hyperpigmentation: Secondary | ICD-10-CM | POA: Diagnosis not present

## 2022-02-08 DIAGNOSIS — L821 Other seborrheic keratosis: Secondary | ICD-10-CM | POA: Diagnosis not present

## 2022-03-27 ENCOUNTER — Other Ambulatory Visit: Payer: Self-pay | Admitting: Internal Medicine

## 2022-03-27 DIAGNOSIS — Z1231 Encounter for screening mammogram for malignant neoplasm of breast: Secondary | ICD-10-CM

## 2022-03-29 ENCOUNTER — Other Ambulatory Visit: Payer: Self-pay | Admitting: Internal Medicine

## 2022-04-09 ENCOUNTER — Other Ambulatory Visit: Payer: Self-pay | Admitting: Internal Medicine

## 2022-04-24 ENCOUNTER — Encounter: Payer: Self-pay | Admitting: Internal Medicine

## 2022-04-24 ENCOUNTER — Ambulatory Visit (INDEPENDENT_AMBULATORY_CARE_PROVIDER_SITE_OTHER): Payer: Medicare Other | Admitting: Internal Medicine

## 2022-04-24 VITALS — BP 122/76 | HR 69 | Temp 98.4°F | Resp 18 | Ht 63.0 in | Wt 198.2 lb

## 2022-04-24 DIAGNOSIS — E038 Other specified hypothyroidism: Secondary | ICD-10-CM | POA: Diagnosis not present

## 2022-04-24 DIAGNOSIS — I10 Essential (primary) hypertension: Secondary | ICD-10-CM | POA: Diagnosis not present

## 2022-04-24 LAB — TSH: TSH: 3.37 u[IU]/mL (ref 0.35–5.50)

## 2022-04-24 NOTE — Progress Notes (Signed)
Subjective:    Patient ID: Lori Joseph, female    DOB: 08-Apr-1941, 81 y.o.   MRN: [id number]  DOS:  04/24/2022 Type of visit - description: Follow-up  Since the last office visit he is doing well and has no major concerns. She also feels well emotionally. She is anticoagulated, denies nausea vomiting.  No blood in the stools.  Review of Systems See above   Past Medical History:  Diagnosis Date   Anal condyloma Warts    Complication of anesthesia    slow to awaken after colonscopy yrs ago   Eczema    hands/legs   Family history of anesthesia complication    daughter hard to wake up   H/O acute pancreatitis    yrs ago   H/O retinal detachment    History of Anemia    History of COVID-19 10/2021   congestion, diarrhea, flu like symptoms x 2 weeks al symptoms resolved   History of external beam radiation therapy    11-11-2015  to  12-30-2014   left breast 50.4 gray, lumpectomy cavity boosted to 62.4 gray   Hurthle cell neoplasm of thyroid    Right  [name] thryoid 10-26-2021 epic, biopsy done 12-20-2021   Hyperlipidemia    Hypertension    Hypothyroidism 05/31/2015   Osteopenia    PAF (paroxysmal atrial fibrillation) ([place]) 03/2017   pressure increased in both eyes not glaucoma    Primary cancer of upper outer quadrant of left female breast ([place])    Dr. 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   benign   Seasonal allergies    Vitamin D deficiency    Wears partial dentures upper and lower     Past Surgical History:  Procedure Laterality Date   BREAST BIOPSY Left 08/27/2014   BREAST BIOPSY Left 11/27/2016   BREAST LUMPECTOMY Left 09/15/2014   CHOLECYSTECTOMY  2000   colonscopy  2018   DILATION AND CURETTAGE OF UTERUS     yrs ago   LASER ABLATION CONDOLAMATA N/A 12/22/2021   Procedure: LASER REMOVAL/ABLATION CONDOLAMATA;  Surgeon: Michael Boston, MD;  Location: [place];  Service: General;  Laterality: N/A;   laser eye surgery, detached retina Left     yrs ago   [place] Left 09/16/2014   Procedure: RADIOACTIVE SEED GUIDED PARTIAL MASTECTOMY WITH AXILLARY SENTINEL LYMPH NODE BIOPSY;  Surgeon: [name], MD;  Location: [place];  Service: General;  Laterality: Left;   RECTAL EXAM UNDER ANESTHESIA N/A 12/22/2021   Procedure: ANORECTAL EXAM UNDER ANESTHESIA excision of perirectal mass  Hemorrhoidal ligation and removal;  Surgeon: Michael Boston, MD;  Location: [place];  Service: General;  Laterality: N/A;   THYROID LOBECTOMY N/A 02/10/2015   Procedure: RIGHT THYROID LOBECTOMY;  Surgeon: [name], MD;  [facility];  Service: General;  Laterality: N/A;   TONSILLECTOMY     age 11 adenoids remved also   TUBAL LIGATION     yrs ago    Current Outpatient Medications  Medication Instructions   b complex vitamins capsule 1 capsule, Oral, Daily   calcium carbonate 2,500 mg, Oral, Every morning   cholecalciferol (VITAMIN D) 2,500 Units, Oral, Every morning   dorzolamide-timolol (COSOPT) 22.3-6.8 MG/ML ophthalmic solution 1 drop, Both Eyes, Every morning   hydrochlorothiazide (HYDRODIURIL) 25 MG tablet TAKE 1/2 TABLET BY MOUTH EVERY DAY   latanoprost (XALATAN) 0.005 % ophthalmic solution PLACE 1 DROP INTO  BOTH EYES NIGHTLY.   levothyroxine (SYNTHROID) 25 MCG tablet TAKE 1 TABLET BY MOUTH EVERY DAY BEFORE BREAKFAST   meclizine (ANTIVERT) 12.5 mg, Oral, 2 times daily PRN, May cause drowsiness   Melatonin 10 MG TABS Oral, Daily at bedtime   metoprolol succinate (TOPROL-XL) 50 MG 24 hr tablet TAKE 1 TABLET BY MOUTH EVERY DAY   Multiple Vitamin (MULTIVITAMIN) tablet 1 tablet, Oral, Every morning,     pravastatin (PRAVACHOL) 40 MG tablet TAKE 1 TABLET BY MOUTH EVERY DAY   sulfamethoxazole-trimethoprim (BACTRIM DS) 800-160 MG tablet 1 tablet, Oral, 2 times daily   XARELTO 20 MG TABS tablet TAKE 1 TABLET BY MOUTH DAILY WITH SUPPER.        Objective:   Physical Exam BP 122/76   Pulse 69   Temp 98.4 F (36.9 C) (Oral)   Resp 18   Ht '5\' 3"'$  (1.6 m)   Wt 198 lb 4 oz (89.9 kg)   LMP  (LMP Unknown)   SpO2 99%   BMI 35.12 kg/m  General:   Well developed, NAD, BMI noted. HEENT:  Normocephalic . Face symmetric, atraumatic Lungs:  CTA B Normal respiratory effort, no intercostal retractions, no accessory muscle use. Heart: Irregularly irregular Lower extremities: no pretibial edema bilaterally  Skin: Not pale. Not jaundice Neurologic:  alert & oriented X3.  Speech normal, gait appropriate for age and unassisted Psych--  Cognition and judgment appear intact.  Cooperative with normal attention span and concentration.  Behavior appropriate. No anxious or depressed appearing.      Assessment     Assessment  Hyperglycemia (a1c= 6.0 11.2019) HTN Hyperlipidemia Hypothyroidism Osteopenia: T score 2010 normal,  T score 05-2012 (-1.2), T score 03/2018 -1.1; on calcium and vitamin D Morbid Obesity Insomnia: INTOLERANT to zolpidem CV: -A. fib, new onset 03-2017 -Low risk stress test 04-2017 Oncology: --Breast cancer, left, dx  2015, s/p XRT 2016, on Arimidex, 01-2020: Oncology release her --Hurthle Cell neoplasm of the thyroid, R thyroidectomy lobe  01-2015 SKIN: ---Eczema ---[facility] 2012  ANEMIA- saw hematology, rx IV iron 10-2015 Chronic right-sided chest pain:  x-rays CT abdomen and pelvis 2016 negative, saw pain mngmt, ortho ---> better with Lidoderm patch and a local injection in the back Glaucoma   H/o acute pancreatitis H/o retinal  detachment Pulmonary: Abnormal chest x-ray and CT chest: Last CT chest 01-2016: See report. CXR 03/27/2016: No acute. Pulmonary: f/u prn ++ FH Lung Ca (pt is not a smoker)  PLAN: HTN: BP today is very good, continue HCTZ, metoprolol.  Last BMP okay. Hypothyroidism: On Synthroid, check TSH. H/o thyroid cancer: Had an ultrasound 10/26/2021 a 1.6 cm L nodule seen, on 12/20/2021, FNA was  planned but at the time the nodule was no longer visualized thus BX canceled. Condyloma acuminata: Had the following procedure 12-2021 (pathology reports okay) LASER REMOVAL/ABLATION CONDOLAMATA EXCISIONAL BIOPSY OF PERIANAL MASS (condyloma) HEMORRHOIDECTOMY X 1 EXCISION OF POSTERIOR VAGINAL WALL MASS (probable polyp) HEMORRHOIDAL LIGATION/PEXY  Stress: see LOV, doing better. Atrial fibrillation: Tolerating anticoagulation well.  To see cardiology next month. Preventive care: Recommend Shingrix No. 2 1 consider COVID-vaccine booster. RTC 6 months CPE

## 2022-04-24 NOTE — Patient Instructions (Addendum)
Recommend to proceed with the following vaccines at your pharmacy: Shingrix (shingles) #2 Covid booster (bivalent)    GO TO THE LAB : Get the blood work     [place] back for   a physical exam by November

## 2022-04-24 NOTE — Assessment & Plan Note (Signed)
HTN: BP today is very good, continue HCTZ, metoprolol.  Last BMP okay. Hypothyroidism: On Synthroid, check TSH. H/o thyroid cancer: Had an ultrasound 10/26/2021 a 1.6 cm L nodule seen, on 12/20/2021, FNA was planned but at the time the nodule was no longer visualized thus BX canceled. Condyloma acuminata: Had the following procedure 12-2021 (pathology reports okay) LASER REMOVAL/ABLATION CONDOLAMATA EXCISIONAL BIOPSY OF PERIANAL MASS (condyloma) HEMORRHOIDECTOMY X 1 EXCISION OF POSTERIOR VAGINAL WALL MASS (probable polyp) HEMORRHOIDAL LIGATION/PEXY  Stress: see LOV, doing better. Atrial fibrillation: Tolerating anticoagulation well.  To see cardiology next month. Preventive care: Recommend Shingrix No. 2 1 consider COVID-vaccine booster. RTC 6 months CPE

## 2022-04-26 ENCOUNTER — Other Ambulatory Visit: Payer: Self-pay | Admitting: Internal Medicine

## 2022-04-27 ENCOUNTER — Telehealth: Payer: Self-pay | Admitting: Internal Medicine

## 2022-04-27 NOTE — Telephone Encounter (Signed)
[employer] [facility] called letting us know that a PA for a shingrix shot needs to be sent to them. Please send to [phone] / fax number: [phone]. She stated that there was an issue with the billing of the shot and they are trying to fix it. Please advise.

## 2022-04-27 NOTE — Telephone Encounter (Signed)
Please advise? Do we do PA's for Shingrix vaccines?

## 2022-04-30 ENCOUNTER — Ambulatory Visit
Admission: RE | Admit: 2022-04-30 | Discharge: 2022-04-30 | Disposition: A | Discharge status: Home / Self Care | Payer: Medicare Other | Source: Ambulatory Visit | Attending: Internal Medicine | Admitting: Internal Medicine

## 2022-04-30 DIAGNOSIS — Z1231 Encounter for screening mammogram for malignant neoplasm of breast: Secondary | ICD-10-CM | POA: Diagnosis not present

## 2022-05-03 DIAGNOSIS — H401131 Primary open-angle glaucoma, bilateral, mild stage: Secondary | ICD-10-CM | POA: Diagnosis not present

## 2022-05-21 NOTE — Progress Notes (Signed)
Date:  05/25/2022   ID:  [name], DOB 08/31/1941, MRN [id number]   Provider Location: Office  PCP:  Colon Branch, MD  Cardiologist:  Jenkins Rouge, MD  Electrophysiologist:  None   Evaluation Performed:  Follow-Up Visit  Chief Complaint:  Atrial fib  History of Present Illness:    Lori Joseph is a 81 y.o. female first seen in June 2018 for new onset atrial fibrillation. She was started on metoprolol and xaretlto  CHADVASC is 4 Converted spontaneously    June 2018  Echo ordered and reviewed EF 55-60% mild LAE trivial MR Myovue normal EF 56% no ischemia    Monitoring was done 03/31/19  and NSR with PACs no PAF or significant arrhythmia.    She seems to have maintained NSR had laser removal of condylomata anal mass 12/22/21 stopped xarelto With no issues   Husband is pretty sedentary , obese and has knee issues so she has to do most of the running around   Past Medical History:  Diagnosis Date   Anal condyloma Warts    Complication of anesthesia    slow to awaken after colonscopy yrs ago   Eczema    hands/legs   Family history of anesthesia complication    daughter hard to wake up   H/O acute pancreatitis    yrs ago   H/O retinal detachment    History of Anemia    History of COVID-19 10/2021   congestion, diarrhea, flu like symptoms x 2 weeks al symptoms resolved   History of external beam radiation therapy    11-11-2015  to  12-30-2014   left breast 50.4 gray, lumpectomy cavity boosted to 62.4 gray   Hurthle cell neoplasm of thyroid    Right  [name] thryoid 10-26-2021 epic, biopsy done 12-20-2021   Hyperlipidemia    Hypertension    Hypothyroidism 05/31/2015   Osteopenia    PAF (paroxysmal atrial fibrillation) ([place]) 03/2017   pressure increased in both eyes not glaucoma    Primary cancer of upper outer quadrant of left female breast ([facility])    Dr. 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   benign   Seasonal allergies    Vitamin D deficiency    Wears partial  dentures upper and lower    Past Surgical History:  Procedure Laterality Date   BREAST BIOPSY Left 08/27/2014   BREAST BIOPSY Left 11/27/2016   BREAST LUMPECTOMY Left 09/15/2014   CHOLECYSTECTOMY  2000   colonscopy  2018   DILATION AND CURETTAGE OF UTERUS     yrs ago   LASER ABLATION CONDOLAMATA N/A 12/22/2021   Procedure: LASER REMOVAL/ABLATION CONDOLAMATA;  Surgeon: Michael Boston, MD;  Location: [place];  Service: General;  Laterality: N/A;   laser eye surgery, detached retina Left    yrs ago   [place] Left 09/16/2014   Procedure: RADIOACTIVE SEED GUIDED PARTIAL MASTECTOMY WITH AXILLARY SENTINEL LYMPH NODE BIOPSY;  Surgeon: [name], MD;  Location: [place];  Service: General;  Laterality: Left;   RECTAL EXAM UNDER ANESTHESIA N/A 12/22/2021   Procedure: ANORECTAL EXAM UNDER ANESTHESIA excision of perirectal mass  Hemorrhoidal ligation and removal;  Surgeon: Michael Boston, MD;  Location: [place];  Service: General;  Laterality: N/A;   THYROID LOBECTOMY N/A 02/10/2015   Procedure: RIGHT THYROID LOBECTOMY;  Surgeon: [name], MD;  [facility];  Service: General;  Laterality:  N/A;   TONSILLECTOMY     age 66 adenoids remved also   TUBAL LIGATION     yrs ago     Current Meds  Medication Sig   calcium carbonate 1250 MG capsule Take 2,500 mg by mouth every morning.    cholecalciferol (VITAMIN D) 1000 UNITS tablet Take 2,500 Units by mouth every morning.    dorzolamide-timolol (COSOPT) 22.3-6.8 MG/ML ophthalmic solution Place 1 drop into both eyes every morning.    hydrochlorothiazide (HYDRODIURIL) 25 MG tablet TAKE 1/2 TABLET BY MOUTH EVERY DAY   latanoprost (XALATAN) 0.005 % ophthalmic solution PLACE 1 DROP INTO BOTH EYES NIGHTLY.   levothyroxine (SYNTHROID) 25 MCG tablet TAKE 1 TABLET BY MOUTH EVERY DAY BEFORE BREAKFAST   meclizine (ANTIVERT)  12.5 MG tablet Take 1 tablet (12.5 mg total) by mouth 2 (two) times daily as needed for dizziness. May cause drowsiness   Melatonin 10 MG TABS Take by mouth at bedtime.   metoprolol succinate (TOPROL-XL) 50 MG 24 hr tablet TAKE 1 TABLET BY MOUTH EVERY DAY   Multiple Vitamin (MULTIVITAMIN) tablet Take 1 tablet by mouth every morning.   pravastatin (PRAVACHOL) 40 MG tablet TAKE 1 TABLET BY MOUTH EVERY DAY   XARELTO 20 MG TABS tablet TAKE 1 TABLET BY MOUTH DAILY WITH SUPPER.     Allergies:   Codeine, Hyaluronic acid  [collagen-chond-hyaluronic acid], Carvedilol, Felodipine, Lisinopril, Losartan potassium, Zolpidem, and Radiaplexrx [pyridoxine-zinc picolinate]   Social History   Tobacco Use   Smoking status: Never   Smokeless tobacco: Never  Vaping Use   Vaping Use: Never used  Substance Use Topics   Alcohol use: No    Alcohol/week: 0.0 standard drinks of alcohol   Drug use: No     Family Hx: The patient's family history includes Breast cancer (age of onset: 34) in her sister; Breast cancer (age of onset: 10) in her sister; Cancer in her brother, father, and paternal uncle; Hyperlipidemia in her mother; Hypertension in her father and mother; Kidney cancer (age of onset: 86) in her sister; Melanoma (age of onset: 72) in her sister; Ovarian cancer (age of onset: 30) in an other family member; Stroke in her mother. There is no history of Colon cancer, CAD, Esophageal cancer, Stomach cancer, or Rectal cancer.  ROS:   Please see the history of present illness.    General:no colds or fevers, no weight changes Skin:no rashes or ulcers HEENT:no blurred vision, no congestion CV:see HPI PUL:see HPI GI:no diarrhea constipation or melena, no indigestion GU:no hematuria, no dysuria MS:no joint pain, no claudication Neuro:no syncope, no lightheadedness Endo:no diabetes, + thyroid disease  All other systems reviewed and are negative.   Prior CV studies:   The following studies were reviewed  today:  Cardiac event monitor 4/20  Notes recorded by Josue Hector, MD on 03/31/2019 at 12:04 PM EDT NSR rate PAC;s no PAF no significant arrhythmia  Echo 05/08/17 Study Conclusions   - Left ventricle: The cavity size was normal. Wall thickness was   normal. Systolic function was normal. The estimated ejection   fraction was in the range of 55% to 60%. Wall motion was normal;   there were no regional wall motion abnormalities. There was no   evidence of elevated ventricular filling pressure by Doppler   parameters. - Aortic valve: Trileaflet; mildly thickened, mildly calcified   leaflets. - Mitral valve: Calcified annulus. There was trivial regurgitation. - Left atrium: The atrium was mildly dilated. - Tricuspid valve: There was mild regurgitation.  Labs/Other Tests and Data Reviewed:    EKG:   02/16/19 afib rate 133 nonspecific ST changes    Recent Labs: 01/25/2022: ALT 11; BUN 20; Creatinine, Ser 0.87; Hemoglobin 12.6; Platelets 154.0; Potassium 3.9; Sodium 141 04/24/2022: TSH 3.37   Recent Lipid Panel Lab Results  Component Value Date/Time   CHOL 185 09/14/2021 09:19 AM   TRIG 88 09/14/2021 09:19 AM   HDL 64 09/14/2021 09:19 AM   CHOLHDL 3 10/16/2019 09:50 AM   LDLCALC 105 (H) 09/14/2021 09:19 AM   LDLDIRECT 177.2 08/04/2013 11:47 AM    Wt Readings from Last 3 Encounters:  05/25/22 200 lb (90.7 kg)  04/24/22 198 lb 4 oz (89.9 kg)  01/25/22 197 lb (89.4 kg)     Objective:    Vital Signs:  BP 118/70   Pulse 77   Ht '5\' 3"'$  (1.6 m)   Wt 200 lb (90.7 kg)   LMP  (LMP Unknown)   SpO2 99%   BMI 35.43 kg/m    Affect appropriate Healthy:  appears stated age HEENT: normal Neck supple with no adenopathy JVP normal no bruits no thyromegaly Lungs clear with no wheezing and good diaphragmatic motion Heart:  S1/S2 no murmur, no rub, gallop or click PMI normal Abdomen: benighn, BS positve, no tenderness, no AAA no bruit.  No HSM or HJR Distal pulses intact with no  bruits No edema Neuro non-focal Skin warm and dry No muscular weakness   ASSESSMENT & PLAN:    PAF :  Stable NSR continue xarelto and Toprol  anticoagulation on xarelto.  Continue no bleeding issues  Hypothyroid on replacement followed by PCP TSH 2.5 10/18/20  HLD:  On statin labs with primary   COVID-19 Education: The signs and symptoms of COVID-19 were discussed with the patient and how to seek care for testing (follow up with PCP or arrange E-visit).  The importance of social distancing was discussed today.  Time:   Today, I have spent 30 minutes with patient     Medication Adjustments/Labs and Tests Ordered: Current medicines are reviewed at length with the patient today.  Concerns regarding medicines are outlined above.   Tests Ordered: No orders of the defined types were placed in this encounter.   Medication Changes: No orders of the defined types were placed in this encounter.   Disposition:  Follow up in a year  Signed, Jenkins Rouge, MD  05/25/2022 10:15 AM    [place]

## 2022-05-25 ENCOUNTER — Encounter: Payer: Self-pay | Admitting: Cardiovascular Disease

## 2022-05-25 ENCOUNTER — Ambulatory Visit: Payer: Medicare Other | Admitting: Cardiovascular Disease

## 2022-05-25 VITALS — BP 118/70 | HR 77 | Ht 63.0 in | Wt 200.0 lb

## 2022-05-25 DIAGNOSIS — E782 Mixed hyperlipidemia: Secondary | ICD-10-CM

## 2022-05-25 DIAGNOSIS — I480 Paroxysmal atrial fibrillation: Secondary | ICD-10-CM | POA: Diagnosis not present

## 2022-05-25 DIAGNOSIS — D6859 Other primary thrombophilia: Secondary | ICD-10-CM | POA: Diagnosis not present

## 2022-05-25 NOTE — Patient Instructions (Signed)

## 2022-07-04 ENCOUNTER — Encounter (INDEPENDENT_AMBULATORY_CARE_PROVIDER_SITE_OTHER): Payer: Self-pay

## 2022-07-20 ENCOUNTER — Other Ambulatory Visit: Payer: Self-pay | Admitting: Internal Medicine

## 2022-07-23 ENCOUNTER — Other Ambulatory Visit: Payer: Self-pay | Admitting: Internal Medicine

## 2022-08-20 DIAGNOSIS — K644 Residual hemorrhoidal skin tags: Secondary | ICD-10-CM | POA: Diagnosis not present

## 2022-08-20 DIAGNOSIS — Z86010 Personal history of colonic polyps: Secondary | ICD-10-CM | POA: Diagnosis not present

## 2022-08-20 DIAGNOSIS — K629 Disease of anus and rectum, unspecified: Secondary | ICD-10-CM | POA: Diagnosis not present

## 2022-09-06 ENCOUNTER — Ambulatory Visit (INDEPENDENT_AMBULATORY_CARE_PROVIDER_SITE_OTHER): Payer: Medicare Other

## 2022-09-06 ENCOUNTER — Encounter: Payer: Self-pay | Admitting: Internal Medicine

## 2022-09-06 DIAGNOSIS — Z23 Encounter for immunization: Secondary | ICD-10-CM | POA: Diagnosis not present

## 2022-09-28 ENCOUNTER — Other Ambulatory Visit: Payer: Self-pay | Admitting: Internal Medicine

## 2022-10-04 DIAGNOSIS — H2513 Age-related nuclear cataract, bilateral: Secondary | ICD-10-CM | POA: Diagnosis not present

## 2022-10-04 DIAGNOSIS — H40003 Preglaucoma, unspecified, bilateral: Secondary | ICD-10-CM | POA: Diagnosis not present

## 2022-10-04 DIAGNOSIS — H401131 Primary open-angle glaucoma, bilateral, mild stage: Secondary | ICD-10-CM | POA: Diagnosis not present

## 2022-10-18 ENCOUNTER — Other Ambulatory Visit: Payer: Self-pay | Admitting: Internal Medicine

## 2022-10-25 ENCOUNTER — Encounter: Payer: Medicare Other | Admitting: Internal Medicine

## 2022-11-08 ENCOUNTER — Encounter: Payer: Self-pay | Admitting: Internal Medicine

## 2022-11-08 ENCOUNTER — Ambulatory Visit (INDEPENDENT_AMBULATORY_CARE_PROVIDER_SITE_OTHER): Payer: Medicare Other | Admitting: Internal Medicine

## 2022-11-08 VITALS — BP 126/68 | HR 97 | Temp 98.0°F | Resp 18 | Ht 63.0 in | Wt 201.4 lb

## 2022-11-08 DIAGNOSIS — Z0000 Encounter for general adult medical examination without abnormal findings: Secondary | ICD-10-CM

## 2022-11-08 DIAGNOSIS — I10 Essential (primary) hypertension: Secondary | ICD-10-CM | POA: Diagnosis not present

## 2022-11-08 DIAGNOSIS — E038 Other specified hypothyroidism: Secondary | ICD-10-CM

## 2022-11-08 DIAGNOSIS — Z0001 Encounter for general adult medical examination with abnormal findings: Secondary | ICD-10-CM

## 2022-11-08 DIAGNOSIS — D34 Benign neoplasm of thyroid gland: Secondary | ICD-10-CM

## 2022-11-08 DIAGNOSIS — Z23 Encounter for immunization: Secondary | ICD-10-CM | POA: Diagnosis not present

## 2022-11-08 DIAGNOSIS — R739 Hyperglycemia, unspecified: Secondary | ICD-10-CM

## 2022-11-08 DIAGNOSIS — R300 Dysuria: Secondary | ICD-10-CM

## 2022-11-08 DIAGNOSIS — E559 Vitamin D deficiency, unspecified: Secondary | ICD-10-CM | POA: Diagnosis not present

## 2022-11-08 LAB — COMPREHENSIVE METABOLIC PANEL
ALT: 17 U/L (ref 0–35)
AST: 22 U/L (ref 0–37)
Albumin: 3.9 g/dL (ref 3.5–5.2)
Alkaline Phosphatase: 71 U/L (ref 39–117)
BUN: 22 mg/dL (ref 6–23)
CO2: 31 mEq/L (ref 19–32)
Calcium: 9.6 mg/dL (ref 8.4–10.5)
Chloride: 101 mEq/L (ref 96–112)
Creatinine, Ser: 0.9 mg/dL (ref 0.40–1.20)
GFR: 59.96 mL/min — ABNORMAL LOW (ref >60.00)
Glucose, Bld: 108 mg/dL — ABNORMAL HIGH (ref 70–99)
Potassium: 4.4 mEq/L (ref 3.5–5.1)
Sodium: 138 mEq/L (ref 135–145)
Total Bilirubin: 0.9 mg/dL (ref 0.2–1.2)
Total Protein: 6.1 g/dL (ref 6.0–8.3)

## 2022-11-08 LAB — VITAMIN D 25 HYDROXY (VIT D DEFICIENCY, FRACTURES): VITD: 70.13 ng/mL (ref 30.00–100.00)

## 2022-11-08 LAB — URINALYSIS, ROUTINE W REFLEX MICROSCOPIC
Bilirubin Urine: NEGATIVE
Ketones, ur: NEGATIVE
Nitrite: POSITIVE — AB
Specific Gravity, Urine: 1.02 (ref 1.000–1.030)
Total Protein, Urine: NEGATIVE
Urine Glucose: NEGATIVE
Urobilinogen, UA: 0.2 (ref 0.0–1.0)
pH: 5.5 (ref 5.0–8.0)

## 2022-11-08 LAB — CBC WITH DIFFERENTIAL/PLATELET
Basophils Absolute: 0 10*3/uL (ref 0.0–0.1)
Basophils Relative: 1 % (ref 0.0–3.0)
Eosinophils Absolute: 0.1 10*3/uL (ref 0.0–0.7)
Eosinophils Relative: 2 % (ref 0.0–5.0)
HCT: 38 % (ref 36.0–46.0)
Hemoglobin: 12.8 g/dL (ref 12.0–15.0)
Lymphocytes Relative: 28.2 % (ref 12.0–46.0)
Lymphs Abs: 1.2 10*3/uL (ref 0.7–4.0)
MCHC: 33.6 g/dL (ref 30.0–36.0)
MCV: 92.7 fl (ref 78.0–100.0)
Monocytes Absolute: 0.3 10*3/uL (ref 0.1–1.0)
Monocytes Relative: 6.7 % (ref 3.0–12.0)
Neutro Abs: 2.6 10*3/uL (ref 1.4–7.7)
Neutrophils Relative %: 62.1 % (ref 43.0–77.0)
Platelets: 171 10*3/uL (ref 150.0–400.0)
RBC: 4.1 Mil/uL (ref 3.87–5.11)
RDW: 13.4 % (ref 11.5–15.5)
WBC: 4.1 10*3/uL (ref 4.0–10.5)

## 2022-11-08 LAB — LIPID PANEL
Cholesterol: 163 mg/dL (ref 0–200)
HDL: 55.3 mg/dL (ref >39.00)
LDL Cholesterol: 87 mg/dL (ref 0–99)
NonHDL: 107.24
Total CHOL/HDL Ratio: 3
Triglycerides: 99 mg/dL (ref 0.0–149.0)
VLDL: 19.8 mg/dL (ref 0.0–40.0)

## 2022-11-08 LAB — HEMOGLOBIN A1C: Hgb A1c MFr Bld: 6.2 % (ref 4.6–6.5)

## 2022-11-08 LAB — TSH: TSH: 2.62 u[IU]/mL (ref 0.35–5.50)

## 2022-11-08 NOTE — Progress Notes (Signed)
Subjective:    Patient ID: Lori Joseph, female    DOB: 07/17/1941, 81 y.o.   MRN: [id number]  DOS:  11/08/2022 Type of visit - description: CPX  Here for CPX. Chronic medical problems were addressed. In general feels well. Having on and off dysuria for the last 5 days, no fever chills No nausea vomiting Did have diarrhea x 1 but that has resolved. Denies gross hematuria or blood in the stools. No chest pain no difficulty breathing. No palpitations. Emotionally doing well.   Review of Systems  Other than above, a 14 point review of systems is negative       Past Medical History:  Diagnosis Date   Anal condyloma Warts    Complication of anesthesia    slow to awaken after colonscopy yrs ago   Eczema    hands/legs   Family history of anesthesia complication    daughter hard to wake up   H/O acute pancreatitis    yrs ago   H/O retinal detachment    History of Anemia    History of COVID-19 10/2021   congestion, diarrhea, flu like symptoms x 2 weeks al symptoms resolved   History of external beam radiation therapy    11-11-2015  to  12-30-2014   left breast 50.4 gray, lumpectomy cavity boosted to 62.4 gray   Hurthle cell neoplasm of thyroid    Right  [name] thryoid 10-26-2021 epic, biopsy done 12-20-2021   Hyperlipidemia    Hypertension    Hypothyroidism 05/31/2015   Osteopenia    PAF (paroxysmal atrial fibrillation) ([place]) 03/2017   pressure increased in both eyes not glaucoma    Primary cancer of upper outer quadrant of left female breast ([place])    Dr. 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   benign   Seasonal allergies    Vitamin D deficiency    Wears partial dentures upper and lower     Past Surgical History:  Procedure Laterality Date   BREAST BIOPSY Left 08/27/2014   BREAST BIOPSY Left 11/27/2016   BREAST LUMPECTOMY Left 09/15/2014   CHOLECYSTECTOMY  2000   colonscopy  2018   DILATION AND CURETTAGE OF UTERUS     yrs ago   [place] N/A 12/22/2021   Procedure: LASER REMOVAL/ABLATION CONDOLAMATA;  Surgeon: Michael Boston, MD;  Location: [place];  Service: General;  Laterality: N/A;   laser eye surgery, detached retina Left    yrs ago   [place] Left 09/16/2014   Procedure: RADIOACTIVE SEED GUIDED PARTIAL MASTECTOMY WITH AXILLARY SENTINEL LYMPH NODE BIOPSY;  Surgeon: [name], MD;  Location: [place];  Service: General;  Laterality: Left;   RECTAL EXAM UNDER ANESTHESIA N/A 12/22/2021   Procedure: ANORECTAL EXAM UNDER ANESTHESIA excision of perirectal mass  Hemorrhoidal ligation and removal;  Surgeon: Michael Boston, MD;  Location: [place];  Service: General;  Laterality: N/A;   THYROID LOBECTOMY N/A 02/10/2015   Procedure: RIGHT THYROID LOBECTOMY;  Surgeon: [name], MD;  [facility];  Service: General;  Laterality: N/A;   TONSILLECTOMY     age 28 adenoids remved also   TUBAL LIGATION     yrs ago   Social History   Socioeconomic History   Marital status: Married    Spouse name: Ruthann Cancer   Number of children: 2   Years of education: Not on file   Highest education level:  Not on file  Occupational History   Occupation: RETIRED-- Retail banker: [employer]  Tobacco Use   Smoking status: Never   Smokeless tobacco: Never  Vaping Use   Vaping Use: Never used  Substance and Sexual Activity   Alcohol use: No    Alcohol/week: 0.0 standard drinks of alcohol   Drug use: No   Sexual activity: Not Currently    Birth control/protection: Post-menopausal  Other Topics Concern   Not on file  Social History Narrative   Lives w/ husband   Social Determinants of Health   Financial Resource Strain: Not on file  Food Insecurity: Not on file  Transportation Needs: Not on file  Physical Activity: Not on file  Stress: Not on file  Social Connections:  Not on file  Intimate Partner Violence: Not on file    Current Outpatient Medications  Medication Instructions   b complex vitamins capsule 1 capsule, Daily   calcium carbonate 2,500 mg, Oral, Every morning   cholecalciferol (VITAMIN D) 2,500 Units, Oral, Every morning   dorzolamide-timolol (COSOPT) 22.3-6.8 MG/ML ophthalmic solution 1 drop, Both Eyes, Every morning   hydrochlorothiazide (HYDRODIURIL) 25 MG tablet TAKE 1/2 TABLET BY MOUTH EVERY DAY   latanoprost (XALATAN) 0.005 % ophthalmic solution PLACE 1 DROP INTO BOTH EYES NIGHTLY.   levothyroxine (SYNTHROID) 25 mcg, Oral, Daily before breakfast   meclizine (ANTIVERT) 12.5 mg, Oral, 2 times daily PRN, May cause drowsiness   Melatonin 10 MG TABS Oral, Daily at bedtime   metoprolol succinate (TOPROL-XL) 50 MG 24 hr tablet TAKE 1 TABLET BY MOUTH EVERY DAY   Multiple Vitamin (MULTIVITAMIN) tablet 1 tablet, Oral, Every morning,     pravastatin (PRAVACHOL) 40 MG tablet TAKE 1 TABLET BY MOUTH EVERY DAY   rivaroxaban (XARELTO) 20 mg, Oral, Daily with supper       Objective:   Physical Exam BP 126/68   Pulse 97   Temp 98 F (36.7 C) (Oral)   Resp 18   Ht '5\' 3"'$  (1.6 m)   Wt 201 lb 6 oz (91.3 kg)   LMP  (LMP Unknown)   SpO2 96%   BMI 35.67 kg/m  General: Well developed, NAD, BMI noted Neck: No  thyromegaly  HEENT:  Normocephalic . Face symmetric, atraumatic Lungs:  CTA B Normal respiratory effort, no intercostal retractions, no accessory muscle use. Heart: RRR,  no murmur.  Abdomen:  Not distended, soft, non-tender. No rebound or rigidity.   Lower extremities: no pretibial edema bilaterally  Skin: Exposed areas without rash. Not pale. Not jaundice Neurologic:  alert & oriented X3.  Speech normal, gait appropriate for age and unassisted Strength symmetric and appropriate for age.  Psych: Cognition and judgment appear intact.  Cooperative with normal attention span and concentration.  Behavior appropriate. No anxious or  depressed appearing.     Assessment     Assessment  Hyperglycemia (a1c= 6.0 11.2019) HTN Hyperlipidemia Hypothyroidism Osteopenia: T score 2010 normal,  T score 05-2012 (-1.2), T score 03/2018 -1.1; on calcium and vitamin D Morbid Obesity Insomnia: INTOLERANT to zolpidem CV: -A. fib, new onset 03-2017 -Low risk stress test 04-2017 Oncology: --Breast cancer, left, dx  2015, s/p XRT 2016, on Arimidex x 5 years; Oncology release her --Hurthle Cell neoplasm of the thyroid, R thyroidectomy lobe  01-2015 SKIN: ---Eczema ---[facility] 2012  ANEMIA- saw hematology, rx IV iron 10-2015 Chronic right-sided chest pain:  x-rays CT abdomen and pelvis 2016 negative, saw pain mngmt, ortho ---> better with Lidoderm  patch and a local injection in the back Glaucoma   H/o acute pancreatitis H/o retinal  detachment Pulmonary: Abnormal chest x-ray and CT chest: Last CT chest 01-2016: See report. CXR 03/27/2016: No acute. Pulmonary: f/u prn ++ [facility] Lung Ca (pt is not a smoker)  PLAN: Here for CPX Hyperglycemia: Check A1c, diet encouraged HTN: BP today is very good, continue metoprolol, HCTZ, checking labs Hyperlipidemia: On Pravachol.  Checking labs Hypothyroidism: On Synthroid, checking labs History of Hurthle cell neoplasm, right thyroidectomy 2016, recheck thyroid ultrasound.  Clinical exam is negative today Osteopenia: On vitamin D, check levels. Last T-score -1.1 in 2022 Condyloma acuminata.  Last visit with surgery 08/20/2022. PAF: Saw cardiology 04/2022, felt to be stable Breast cancer, history of, sees gynecology for clinical breast exam, last MMG June 2023. LUTS: Checking UA urine culture HOH?  Some decreased hearing, we had a long conversation about it, denies tinnitus. HOH is not bothering her.  Offered audiology referral but we agreed on observation RTC 4 months

## 2022-11-08 NOTE — Assessment & Plan Note (Addendum)
-   Td 2018  - PNM 23 2008, 2018 ; Prevnar 2015.  PNM 20: Today - zostavax 2012,  shingrix x2  - RSV d/w  - covid vax d/w pt   - had a Flu shot    -Female care:  sees gyn  H/o breast ca, last MMG  04-2022 ([facility]) -CCS: Cscope @ [facility] 01-2008 : 2 polyps ----> tubular adenomas w/  low grade dysplasia Cscope 08/2010, Dr Henrene Pastor, very small polyps Last Cscope 08-2016: Adenomatous polyps, no follow-up due to age per GI letter  --Labs: CMP, FLP, A1c, TSH, UA urine culture, CBC, vitamin D -Diet and exercise discussed -Advance care planning : documents on file

## 2022-11-08 NOTE — Assessment & Plan Note (Signed)
Here for CPX Hyperglycemia: Check A1c, diet encouraged HTN: BP today is very good, continue metoprolol, HCTZ, checking labs Hyperlipidemia: On Pravachol.  Checking labs Hypothyroidism: On Synthroid, checking labs History of Hurthle cell neoplasm, right thyroidectomy 2016, recheck thyroid ultrasound.  Clinical exam is negative today Osteopenia: On vitamin D, check levels. Last T-score -1.1 in 2022 Condyloma acuminata.  Last visit with surgery 08/20/2022. PAF: Saw cardiology 04/2022, felt to be stable Breast cancer, history of, sees gynecology for clinical breast exam, last MMG June 2023. LUTS: Checking UA urine culture HOH?  Some decreased hearing, we had a long conversation about it, denies tinnitus. HOH is not bothering her.  Offered audiology referral but we agreed on observation RTC 4 months

## 2022-11-08 NOTE — Patient Instructions (Addendum)
Please go to the first floor and schedule a ultrasound of your thyroid.  This is a routine test.  Vaccines I recommend:  Covid booster RSV vaccine   Check the  blood pressure regularly BP GOAL is between 110/65 and  135/85. If it is consistently higher or lower, let me know     GO TO THE LAB : Get the blood work     [place], [place] back for   In 4 months     Do you have a "Living will" or "[place] of attorney"? (Advance care planning documents)  If you already have a living will or healthcare power of attorney, is recommended you bring the copy to be scanned in your chart. The document will be available to all the doctors you see in the system.  If you don't have one, please consider create one.  amily or surrogate to make the decision if the patient is not capable to do so.    More information at:  [email or web address]

## 2022-11-10 LAB — URINE CULTURE
MICRO NUMBER:: 14315627
SPECIMEN QUALITY:: ADEQUATE

## 2022-11-11 ENCOUNTER — Other Ambulatory Visit: Payer: Self-pay | Admitting: Internal Medicine

## 2022-11-11 MED ORDER — SULFAMETHOXAZOLE-TRIMETHOPRIM 400-80 MG PO TABS: 1.0000 | ORAL_TABLET | Freq: Two times a day (BID) | ORAL | 0 refills | Status: DC

## 2022-11-16 ENCOUNTER — Telehealth (HOSPITAL_BASED_OUTPATIENT_CLINIC_OR_DEPARTMENT_OTHER): Payer: Self-pay

## 2022-11-20 ENCOUNTER — Telehealth (HOSPITAL_BASED_OUTPATIENT_CLINIC_OR_DEPARTMENT_OTHER): Payer: Self-pay

## 2022-11-21 ENCOUNTER — Ambulatory Visit (HOSPITAL_BASED_OUTPATIENT_CLINIC_OR_DEPARTMENT_OTHER)
Admission: RE | Admit: 2022-11-21 | Discharge: 2022-11-21 | Disposition: A | Discharge status: Home / Self Care | Payer: Medicare Other | Source: Ambulatory Visit | Attending: Internal Medicine | Admitting: Internal Medicine

## 2022-11-21 DIAGNOSIS — D34 Benign neoplasm of thyroid gland: Secondary | ICD-10-CM | POA: Diagnosis not present

## 2022-11-21 DIAGNOSIS — E041 Nontoxic single thyroid nodule: Secondary | ICD-10-CM | POA: Diagnosis not present

## 2022-11-21 DIAGNOSIS — E890 Postprocedural hypothyroidism: Secondary | ICD-10-CM | POA: Diagnosis not present

## 2022-12-13 ENCOUNTER — Other Ambulatory Visit: Payer: Self-pay | Admitting: Internal Medicine

## 2023-01-16 ENCOUNTER — Other Ambulatory Visit: Payer: Self-pay | Admitting: Internal Medicine

## 2023-01-31 DIAGNOSIS — D485 Neoplasm of uncertain behavior of skin: Secondary | ICD-10-CM | POA: Diagnosis not present

## 2023-01-31 DIAGNOSIS — L988 Other specified disorders of the skin and subcutaneous tissue: Secondary | ICD-10-CM | POA: Diagnosis not present

## 2023-01-31 DIAGNOSIS — L989 Disorder of the skin and subcutaneous tissue, unspecified: Secondary | ICD-10-CM | POA: Diagnosis not present

## 2023-02-06 DIAGNOSIS — L2089 Other atopic dermatitis: Secondary | ICD-10-CM | POA: Diagnosis not present

## 2023-02-06 DIAGNOSIS — L298 Other pruritus: Secondary | ICD-10-CM | POA: Diagnosis not present

## 2023-02-06 DIAGNOSIS — L989 Disorder of the skin and subcutaneous tissue, unspecified: Secondary | ICD-10-CM | POA: Diagnosis not present

## 2023-02-06 DIAGNOSIS — L309 Dermatitis, unspecified: Secondary | ICD-10-CM | POA: Diagnosis not present

## 2023-02-06 DIAGNOSIS — L259 Unspecified contact dermatitis, unspecified cause: Secondary | ICD-10-CM | POA: Diagnosis not present

## 2023-03-08 ENCOUNTER — Encounter: Payer: Self-pay | Admitting: Family

## 2023-03-08 ENCOUNTER — Telehealth (INDEPENDENT_AMBULATORY_CARE_PROVIDER_SITE_OTHER): Payer: Medicare Other | Admitting: Family

## 2023-03-08 VITALS — Ht 63.0 in

## 2023-03-08 DIAGNOSIS — A084 Viral intestinal infection, unspecified: Secondary | ICD-10-CM

## 2023-03-08 MED ORDER — ONDANSETRON HCL 4 MG PO TABS: 4.0000 mg | ORAL_TABLET | Freq: Three times a day (TID) | ORAL | 0 refills | Status: DC | PRN

## 2023-03-08 NOTE — Progress Notes (Signed)
Lori Joseph is a 82 y.o. female with the following history as recorded in EpicCare:  Patient Active Problem List   Diagnosis Date Noted   Elevated LFTs 09/14/2021   Vitamin D deficiency 09/14/2021   Prediabetes 09/14/2021   Atrial fibrillation (HCC).Onset 03/2017 04/26/2017   Pulmonary infiltrates on CXR 01/30/2016   Upper airway cough syndrome 01/30/2016   PCP NOTES >>>>>>>>>>>>>>>> 08/25/2015   Hypothyroidism 05/31/2015   Family history of malignant neoplasm of breast 03/11/2015   Family history of malignant neoplasm of ovary 03/11/2015   PCP comments--R Chest wall and flank pain 12/31/2014   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, 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/2014   Chest pain 12/31/2014   Breast cancer of upper-outer quadrant of left female breast 09/08/2014   Eczema 08/05/2014   Annual physical exam >>>>>>>>>>>>>>>>>>>>>>>> 06/18/2012   BCC (basal cell carcinoma of skin) 06/18/2012   OTHER&UNSPECIFIED DISEASES THE ORAL SOFT TISSUES 02/10/2011   HYPERLIPIDEMIA 01/12/2011   OSTEOPENIA 09/28/2009   COLONIC POLYPS, 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     calcium carbonate 1250 MG capsule Take 2,500 mg by mouth every morning.      cholecalciferol (VITAMIN D) 1000 UNITS tablet Take 2,500 Units by mouth every morning.      dorzolamide-timolol (COSOPT) 22.3-6.8 MG/ML ophthalmic solution Place 1 drop into both eyes every morning.      hydrochlorothiazide (HYDRODIURIL) 25 MG tablet TAKE 1/2 TABLET BY MOUTH EVERY DAY 45 tablet 1   latanoprost (XALATAN) 0.005 % ophthalmic solution PLACE 1 DROP INTO BOTH EYES NIGHTLY.     levothyroxine (SYNTHROID) 25 MCG tablet TAKE 1 TABLET BY MOUTH DAILY BEFORE BREAKFAST. 90 tablet 1   meclizine (ANTIVERT)  12.5 MG tablet Take 1 tablet (12.5 mg total) by mouth 2 (two) times daily as needed for dizziness. May cause drowsiness 10 tablet 0   Melatonin 10 MG TABS Take by mouth at bedtime.     metoprolol succinate (TOPROL-XL) 50 MG 24 hr tablet TAKE 1 TABLET BY MOUTH EVERY DAY 90 tablet 1   Multiple Vitamin (MULTIVITAMIN) tablet Take 1 tablet by mouth every morning.     ondansetron (ZOFRAN) 4 MG tablet Take 1 tablet (4 mg total) by mouth every 8 (eight) hours as needed for nausea or vomiting. 20 tablet 0   pravastatin (PRAVACHOL) 40 MG tablet TAKE 1 TABLET BY MOUTH EVERY DAY 90 tablet 1   rivaroxaban (XARELTO) 20 MG TABS tablet Take 1 tablet (20 mg total) by mouth daily with supper. 90 tablet 1   sulfamethoxazole-trimethoprim (BACTRIM) 400-80 MG tablet Take 1 tablet by mouth 2 (two) times daily. 10 tablet 0   No current facility-administered medications for this visit.    Allergies: Codeine, Hyaluronic acid  [collagen-chond-hyaluronic acid], Carvedilol, Felodipine, Lisinopril, Losartan potassium, Zolpidem, and Radiaplexrx [pyridoxine-zinc picolinate]  Past Medical History:  Diagnosis Date   Anal condyloma Warts    Complication of anesthesia    slow to awaken after colonscopy yrs ago   Eczema    hands/legs   Family history of anesthesia complication    daughter hard to wake up   H/O acute pancreatitis    yrs ago   H/O retinal detachment    History of Anemia    History of COVID-19 10/2021  congestion, diarrhea, flu like symptoms x 2 weeks al symptoms resolved   History of external beam radiation therapy    11-11-2015  to  12-30-2014   left breast 50.4 gray, lumpectomy cavity boosted to 62.4 gray   Hurthle cell neoplasm of thyroid    Right  [name] thryoid 10-26-2021 epic, biopsy done 12-20-2021   Hyperlipidemia    Hypertension    Hypothyroidism 05/31/2015   Osteopenia    PAF (paroxysmal atrial fibrillation) 03/2017   pressure increased in both eyes not glaucoma    Primary cancer of upper outer  quadrant of left female breast    Dr. 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   Seasonal allergies    Vitamin D deficiency    Wears partial dentures upper and lower     Past Surgical History:  Procedure Laterality Date   BREAST BIOPSY Left 08/27/2014   BREAST BIOPSY Left 11/27/2016   BREAST LUMPECTOMY Left 09/15/2014   CHOLECYSTECTOMY  2000   colonscopy  2018   DILATION AND CURETTAGE OF UTERUS     yrs ago   LASER ABLATION CONDOLAMATA N/A 12/22/2021   Procedure: LASER REMOVAL/ABLATION CONDOLAMATA;  Surgeon: Karie Soda, MD;  Location: [facility];  Service: General;  Laterality: N/A;   laser eye surgery, detached retina Left    yrs ago   RADIOACTIVE SEED GUIDED PARTIAL MASTECTOMY WITH AXILLARY SENTINEL LYMPH NODE BIOPSY Left 09/16/2014   Procedure: RADIOACTIVE SEED GUIDED PARTIAL MASTECTOMY WITH AXILLARY SENTINEL LYMPH NODE BIOPSY;  Surgeon: Almond Lint, MD;  Location: [facility];  Service: General;  Laterality: Left;   RECTAL EXAM UNDER ANESTHESIA N/A 12/22/2021   Procedure: ANORECTAL EXAM UNDER ANESTHESIA excision of perirectal mass  Hemorrhoidal ligation and removal;  Surgeon: Karie Soda, MD;  Location: [facility];  Service: General;  Laterality: N/A;   THYROID LOBECTOMY N/A 02/10/2015   Procedure: RIGHT THYROID LOBECTOMY;  Surgeon: Almond Lint, MD;  Location: [facility];  Service: General;  Laterality: N/A;   TONSILLECTOMY     age 85 adenoids remved also   TUBAL LIGATION     yrs ago    Family History  Problem Relation Age of Onset   Stroke Mother    Hyperlipidemia Mother    Hypertension Mother    Hypertension Father    Cancer Father        lung cancer ; smoker   Breast cancer Sister 70   Kidney cancer Sister 36   Breast cancer Sister 57       passed 44   Melanoma Sister 80   Cancer Brother        3 brothers with lung cancer, all smokers   Cancer Paternal Uncle        2 pat uncles with lung cancer, smokers    Ovarian cancer Other 10       niece with ovarian cancer (related through sister with breast cancer)   Colon cancer Neg Hx    CAD Neg Hx    Esophageal cancer Neg Hx    Stomach cancer Neg Hx    Rectal cancer Neg Hx     Social History   Tobacco Use   Smoking status: Never   Smokeless tobacco: Never  Substance Use Topics   Alcohol use: No    Alcohol/week: 0.0 standard drinks of alcohol    Subjective:    I connected with Lori Joseph on 03/08/23 at  1:00 PM EDT by a video  enabled telemedicine application and verified that I am speaking with the correct person using two identifiers.   I discussed the limitations of evaluation and management by telemedicine and the availability of in person appointments. The patient expressed understanding and agreed to proceed.  Provider in office/ patient is at home; provider and patient are only 2 people on video call.    Started last night with sudden onset of nausea/ vomiting/ diarrhea; "was up all night." Tried drinking Pedialyte and Gatorade this morning and unable to keep it down; continuing to struggle with nausea;    Objective:  Vitals:   03/08/23 1307  Height:  (1.6 m)    General: Well developed, well nourished, in no acute distress  Head: Normocephalic and atraumatic  Lungs: Respirations unlabored;  Neurologic: Alert and oriented; speech intact; face symmetrical;   Assessment:  1. Viral gastroenteritis     Plan:  Rx for Zofran to help with nausea; BRAT diet; strict ER precautions discussed for upcoming weekend; follow up worse, no better.   No follow-ups on file.  No orders of the defined types were placed in this encounter.   Requested Prescriptions   Signed Prescriptions Disp Refills   ondansetron (ZOFRAN) 4 MG tablet 20 tablet 0    Sig: Take 1 tablet (4 mg total) by mouth every 8 (eight) hours as needed for nausea or vomiting.

## 2023-03-26 ENCOUNTER — Ambulatory Visit (INDEPENDENT_AMBULATORY_CARE_PROVIDER_SITE_OTHER): Payer: Medicare Other | Admitting: Internal Medicine

## 2023-03-26 ENCOUNTER — Encounter: Payer: Self-pay | Admitting: Internal Medicine

## 2023-03-26 VITALS — BP 138/86 | HR 67 | Temp 98.0°F | Resp 18 | Ht 63.0 in | Wt 195.5 lb

## 2023-03-26 DIAGNOSIS — I10 Essential (primary) hypertension: Secondary | ICD-10-CM

## 2023-03-26 DIAGNOSIS — E038 Other specified hypothyroidism: Secondary | ICD-10-CM | POA: Diagnosis not present

## 2023-03-26 NOTE — Patient Instructions (Addendum)
Vaccines I recommend: Covid booster RSV vaccine  Check the  blood pressure regularly BP GOAL is between 110/65 and  135/85. If it is consistently higher or lower, let me know     GO TO THE LAB : Get the blood work     GO TO THE FRONT DESK, PLEASE SCHEDULE YOUR APPOINTMENTS Come back for a checkup in 5 to 6 months

## 2023-03-26 NOTE — Progress Notes (Unsigned)
Subjective:    Patient ID: Lori Joseph, female    DOB: [birth year], 82 y.o.   MRN: [id number]  DOS:  03/26/2023 Type of visit - description: f/u  Routine checkup. Has no concerns. Good med compliance. Ambulatory BPs when checked are typically normal. Previous labs reviewed with the patient.  Denies chest pain no difficulty breathing No lower extremity edema or palpitations. No blood in the stools or in the urine No LUTS  Review of Systems See above   Past Medical History:  Diagnosis Date   Anal condyloma Warts    Complication of anesthesia    slow to awaken after colonscopy yrs ago   Eczema    hands/legs   Family history of anesthesia complication    daughter hard to wake up   H/O acute pancreatitis    yrs ago   H/O retinal detachment    History of Anemia    History of COVID-19 10/2021   congestion, diarrhea, flu like symptoms x 2 weeks al symptoms resolved   History of external beam radiation therapy    11-11-2015  to  12-30-2014   left breast 50.4 gray, lumpectomy cavity boosted to 62.4 gray   Hurthle cell neoplasm of thyroid    Right  [name] thryoid 10-26-2021 epic, biopsy done 12-20-2021   Hyperlipidemia    Hypertension    Hypothyroidism 05/31/2015   Osteopenia    PAF (paroxysmal atrial fibrillation) (HCC) 03/2017   pressure increased in both eyes not glaucoma    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   Seasonal allergies    Vitamin D deficiency    Wears partial dentures upper and lower     Past Surgical History:  Procedure Laterality Date   BREAST BIOPSY Left 08/27/2014   BREAST BIOPSY Left 11/27/2016   BREAST LUMPECTOMY Left 09/15/2014   CHOLECYSTECTOMY  2000   colonscopy  2018   DILATION AND CURETTAGE OF UTERUS     yrs ago   LASER ABLATION CONDOLAMATA N/A 12/22/2021   Procedure: LASER REMOVAL/ABLATION CONDOLAMATA;  Surgeon: Karie Soda, MD;  Location: [facility];  Service: General;  Laterality: N/A;   laser eye surgery, detached retina Left    yrs ago   RADIOACTIVE SEED GUIDED PARTIAL MASTECTOMY WITH AXILLARY SENTINEL LYMPH NODE BIOPSY Left 09/16/2014   Procedure: RADIOACTIVE SEED GUIDED PARTIAL MASTECTOMY WITH AXILLARY SENTINEL LYMPH NODE BIOPSY;  Surgeon: Almond Lint, MD;  Location: [facility];  Service: General;  Laterality: Left;   RECTAL EXAM UNDER ANESTHESIA N/A 12/22/2021   Procedure: ANORECTAL EXAM UNDER ANESTHESIA excision of perirectal mass  Hemorrhoidal ligation and removal;  Surgeon: Karie Soda, MD;  Location: [facility];  Service: General;  Laterality: N/A;   THYROID LOBECTOMY N/A 02/10/2015   Procedure: RIGHT THYROID LOBECTOMY;  Surgeon: Almond Lint, MD;  Location: [facility];  Service: General;  Laterality: N/A;   TONSILLECTOMY     age 40 adenoids remved also   TUBAL LIGATION     yrs ago    Current Outpatient Medications  Medication Instructions   b complex vitamins capsule 1 capsule, Oral, Daily   calcium carbonate 2,500 mg, Oral, Every morning   cholecalciferol (VITAMIN D) 2,500 Units, Oral, Every morning   dorzolamide-timolol (COSOPT) 22.3-6.8 MG/ML ophthalmic solution 1 drop, Both Eyes, Every morning   hydrochlorothiazide (HYDRODIURIL) 25 MG tablet TAKE 1/2 TABLET BY MOUTH  EVERY DAY   latanoprost (XALATAN) 0.005 % ophthalmic solution PLACE 1 DROP INTO BOTH EYES NIGHTLY.   levothyroxine (SYNTHROID) 25 mcg, Oral, Daily before breakfast   meclizine (ANTIVERT) 12.5 mg, Oral, 2 times daily PRN, May cause drowsiness   Melatonin 10 MG TABS Oral, Daily at bedtime   metoprolol succinate (TOPROL-XL) 50 MG 24 hr tablet TAKE 1 TABLET BY MOUTH EVERY DAY   Multiple Vitamin (MULTIVITAMIN) tablet 1 tablet, Oral, Every morning,     ondansetron (ZOFRAN) 4 mg, Oral, Every 8 hours PRN   pravastatin (PRAVACHOL) 40 MG tablet TAKE 1 TABLET BY MOUTH EVERY DAY   rivaroxaban (XARELTO) 20 mg, Oral,  Daily with supper   sulfamethoxazole-trimethoprim (BACTRIM) 400-80 MG tablet 1 tablet, Oral, 2 times daily       Objective:   Physical Exam BP 138/86   Pulse 67   Temp 98 F (36.7 C) (Oral)   Resp 18   Ht 5\' 3"  (1.6 m)   Wt 195 lb 8 oz (88.7 kg)   LMP  (LMP Unknown)   SpO2 97%   BMI 34.63 kg/m  General:   Well developed, NAD, BMI noted. HEENT:  Normocephalic . Face symmetric, atraumatic Lungs:  CTA B Normal respiratory effort, no intercostal retractions, no accessory muscle use. Heart: Irregularly irregular Lower extremities: no pretibial edema bilaterally  Skin: Not pale. Not jaundice Neurologic:  alert & oriented X3.  Speech normal, gait appropriate for age and unassisted Psych--  Cognition and judgment appear intact.  Cooperative with normal attention span and concentration.  Behavior appropriate. No anxious or depressed appearing.      Assessment   Assessment  Hyperglycemia (a1c= 6.0 11.2019) HTN Hyperlipidemia Hypothyroidism Osteopenia: T score 2010 normal,  T score 05-2012 (-1.2), T score 03/2018 -1.1; Tscore -1.1 (2022) on calcium and vitamin D Morbid Obesity Insomnia: INTOLERANT to zolpidem CV: -A. fib, new onset 03-2017 -Low risk stress test 04-2017 Oncology: --Breast cancer, left, dx  2015, s/p XRT 2016, on Arimidex x 5 years; Oncology release her --Hurthle Cell neoplasm of the thyroid, R thyroidectomy lobe  01-2015 SKIN: ---Eczema ---[facility] 2012  ANEMIA- saw hematology, rx IV iron 10-2015 Chronic right-sided chest pain:  x-rays CT abdomen and pelvis 2016 negative, saw pain mngmt, ortho ---> better with Lidoderm patch and a local injection in the back Glaucoma   H/o acute pancreatitis H/o retinal  detachment Pulmonary: Abnormal chest x-ray and CT chest: Last CT chest 01-2016: See report. CXR 03/27/2016: No acute. Pulmonary: f/u prn ++ FH Lung Ca (pt is not a smoker)  PLAN: HTN: BP is very good, when checked at home normal, continue HCTZ, metoprolol,  check a BMP Hypothyroidism: On Synthroid, check TSH. Hurthle cell neoplasm, R thyroidectomy: Last thyroid ultrasound satisfactory, no further routine ultrasounds. PAF: Anticoagulated, denies any unusual bleeding seen. Preventive care: RTC 5 to 6 months Neck 12=== Here for CPX Hyperglycemia: Check A1c, diet encouraged HTN: BP today is very good, continue metoprolol, HCTZ, checking labs Hyperlipidemia: On Pravachol.  Checking labs Hypothyroidism: On Synthroid, checking labs History of Hurthle cell neoplasm, right thyroidectomy 2016, recheck thyroid ultrasound.  Clinical exam is negative today Osteopenia: On vitamin D, check levels. Last T-score -1.1 in 2022 Condyloma acuminata.  Last visit with surgery 08/20/2022. PAF: Saw cardiology 04/2022, felt to be stable Breast cancer, history of, sees gynecology for clinical breast exam, last MMG June 2023. LUTS: Checking UA urine culture HOH?  Some decreased hearing, we had a long conversation about it, denies tinnitus. HOH is not  bothering her.  Offered audiology referral but we agreed on observation RTC 4 months

## 2023-03-27 LAB — BASIC METABOLIC PANEL
BUN: 23 mg/dL (ref 6–23)
CO2: 28 mEq/L (ref 19–32)
Calcium: 9.3 mg/dL (ref 8.4–10.5)
Chloride: 103 mEq/L (ref 96–112)
Creatinine, Ser: 0.92 mg/dL (ref 0.40–1.20)
GFR: 58.24 mL/min — ABNORMAL LOW (ref >60.00)
Glucose, Bld: 95 mg/dL (ref 70–99)
Potassium: 4.5 mEq/L (ref 3.5–5.1)
Sodium: 140 mEq/L (ref 135–145)

## 2023-03-27 LAB — TSH: TSH: 2.6 u[IU]/mL (ref 0.35–5.50)

## 2023-03-28 NOTE — Assessment & Plan Note (Signed)
HTN: BP is very good, when checked at home normal, continue HCTZ, metoprolol, check a BMP Hypothyroidism: On Synthroid, check TSH. Hurthle cell neoplasm, R thyroidectomy: Last thyroid ultrasound satisfactory, no further routine ultrasounds. PAF: Anticoagulated, denies any unusual bleeding seen. Preventive care: RTC 5 to 6 months

## 2023-03-29 ENCOUNTER — Other Ambulatory Visit: Payer: Self-pay | Admitting: Internal Medicine

## 2023-04-24 DIAGNOSIS — H401131 Primary open-angle glaucoma, bilateral, mild stage: Secondary | ICD-10-CM | POA: Diagnosis not present

## 2023-04-24 DIAGNOSIS — H2513 Age-related nuclear cataract, bilateral: Secondary | ICD-10-CM | POA: Diagnosis not present

## 2023-04-29 ENCOUNTER — Other Ambulatory Visit: Payer: Self-pay | Admitting: Internal Medicine

## 2023-05-01 DIAGNOSIS — L814 Other melanin hyperpigmentation: Secondary | ICD-10-CM | POA: Diagnosis not present

## 2023-05-01 DIAGNOSIS — L853 Xerosis cutis: Secondary | ICD-10-CM | POA: Diagnosis not present

## 2023-05-02 ENCOUNTER — Other Ambulatory Visit: Payer: Self-pay | Admitting: Internal Medicine

## 2023-05-02 DIAGNOSIS — Z1231 Encounter for screening mammogram for malignant neoplasm of breast: Secondary | ICD-10-CM

## 2023-05-10 ENCOUNTER — Telehealth: Payer: Self-pay | Admitting: Internal Medicine

## 2023-05-10 NOTE — Telephone Encounter (Signed)
Initial Comment Caller has Afib and earlier in the weeks she was very dizzy and this morning she's still feeling dizzy Translation No Nurse Assessment Nurse: Stefano Gaul, RN, Dwana Curd Date/Time (Eastern Time): 05/10/2023 9:57:37 AM Confirm and document reason for call. If symptomatic, describe symptoms. ---Caller states she has A fib. she was very dizzy on Wednesday and yesterday. She is still dizzy today but it is better. Does the patient have any new or worsening symptoms? ---Yes Will a triage be completed? ---Yes Related visit to physician within the last 2 weeks? ---Yes Does the PT have any chronic conditions? (i.e. diabetes, asthma, this includes High risk factors for pregnancy, etc.) ---Yes List chronic conditions. ---A fib; HTN Is this a behavioral health or substance abuse call? ---No Guidelines Guideline Title Affirmed Question Affirmed Notes Nurse Date/Time (Eastern Time) Dizziness - Lightheadedness Taking a medicine that could cause dizziness (e.g., blood pressure medications, diuretics) Stefano Gaul, RN, Dwana Curd 05/10/2023 10:00:26 AM Disp. Time Lamount Cohen Time) Disposition Final User 05/10/2023 10:07:25 AM See PCP within 24 Hours Yes Stefano Gaul, RN, Dwana Curd PLEASE NOTE: All timestamps contained within this report are represented as [place] Standard Time. CONFIDENTIALTY NOTICE: This fax transmission is intended only for the addressee. It contains information that is legally privileged, confidential or otherwise protected from use or disclosure. If you are not the intended recipient, you are strictly prohibited from reviewing, disclosing, copying using or disseminating any of this information or taking any action in reliance on or regarding this information. If you have received this fax in error, please notify us immediately by telephone so that we can arrange for its return to [name]. Phone: [phone], Toll-Free: [phone], Fax: [phone] Page: 2 of 2 Call Id: [id number] Final  Disposition 05/10/2023 10:07:25 AM See PCP within 24 Hours Yes Stefano Gaul, RN, Clerance Lav Disagree/Comply Disagree Caller Understands Yes PreDisposition Call Doctor Care Advice Given Per Guideline SEE PCP WITHIN 24 HOURS: * IF OFFICE WILL BE OPEN: You need to be examined within the next 24 hours. Call your doctor (or NP/PA) when the office opens and make an appointment. CALL BACK IF: * Passes out (faints) * You become worse CARE ADVICE given per Dizziness (Adult) guideline. Comments User: Art Buff, RN Date/Time Lamount Cohen Time): 05/10/2023 10:07:11 AM pt does not want to make appt at this time. she wants to speak with someone in Dr. Leta Jungling office. please call pt back Referrals REFERRED TO PCP OFFICE

## 2023-05-10 NOTE — Telephone Encounter (Signed)
Pt w/ dizziness- refuses to make appt. Just an FYI.

## 2023-05-10 NOTE — Telephone Encounter (Signed)
FYI: This call has been transferred to Access Nurse. Once the result note has been entered staff can address the message at that time.  Patient called in with the following symptoms:  Red Word:dizziness  and Loss of balance   Please advise at Mobile [phone] (mobile)  Message is routed to Provider Pool and [facility] Triage

## 2023-05-22 ENCOUNTER — Ambulatory Visit
Admission: RE | Admit: 2023-05-22 | Discharge: 2023-05-22 | Disposition: A | Discharge status: Home / Self Care | Payer: Medicare Other | Source: Ambulatory Visit | Attending: Internal Medicine | Admitting: Internal Medicine

## 2023-05-22 DIAGNOSIS — Z1231 Encounter for screening mammogram for malignant neoplasm of breast: Secondary | ICD-10-CM | POA: Diagnosis not present

## 2023-05-28 ENCOUNTER — Other Ambulatory Visit: Payer: Self-pay | Admitting: Internal Medicine

## 2023-05-30 NOTE — Progress Notes (Signed)
Date:  06/13/2023   ID:  [name], DOB February 24, 1941, MRN [id number]   Provider Location: Office  PCP:  Wanda Plump, MD  Cardiologist:  Charlton Haws, MD  Electrophysiologist:  None   Evaluation Performed:  Follow-Up Visit  Chief Complaint:  Atrial fib  History of Present Illness:    Lori Joseph is a 82 y.o. female first seen in June 2018 for new onset atrial fibrillation. She was started on metoprolol and xaretlto  CHADVASC is 4 Converted spontaneously    June 2018  Echo ordered and reviewed EF 55-60% mild LAE trivial MR Myovue normal EF 56% no ischemia    Monitoring was done 03/31/19  and NSR with PACs no PAF or significant arrhythmia.    She seems to have maintained NSR had laser removal of condylomata anal mass 12/22/21 stopped xarelto With no issues   Husband is pretty sedentary , obese and has knee issues so she has to do most of the running around   In office today she is in afib does not notice and rates are fine She is flying to [place] in a week to see grand daughter who is teaching there as part of her education  Past Medical History:  Diagnosis Date   Anal condyloma Warts    Complication of anesthesia    slow to awaken after colonscopy yrs ago   Eczema    hands/legs   Family history of anesthesia complication    daughter hard to wake up   H/O acute pancreatitis    yrs ago   H/O retinal detachment    History of Anemia    History of COVID-19 10/2021   congestion, diarrhea, flu like symptoms x 2 weeks al symptoms resolved   History of external beam radiation therapy    11-11-2015  to  12-30-2014   left breast 50.4 gray, lumpectomy cavity boosted to 62.4 gray   Hurthle cell neoplasm of thyroid    Right  [name] thryoid 10-26-2021 epic, biopsy done 12-20-2021   Hyperlipidemia    Hypertension    Hypothyroidism 05/31/2015   Osteopenia    PAF (paroxysmal atrial fibrillation) (HCC) 03/2017   pressure increased in both eyes not glaucoma    Primary cancer of upper  outer quadrant of left female breast (HCC)    Dr. 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   Seasonal allergies    Vitamin D deficiency    Wears partial dentures upper and lower    Past Surgical History:  Procedure Laterality Date   BREAST BIOPSY Left 08/27/2014   BREAST BIOPSY Left 11/27/2016   BREAST LUMPECTOMY Left 09/15/2014   CHOLECYSTECTOMY  2000   colonscopy  2018   DILATION AND CURETTAGE OF UTERUS     yrs ago   LASER ABLATION CONDOLAMATA N/A 12/22/2021   Procedure: LASER REMOVAL/ABLATION CONDOLAMATA;  Surgeon: Karie Soda, MD;  Location: [facility];  Service: General;  Laterality: N/A;   laser eye surgery, detached retina Left    yrs ago   RADIOACTIVE SEED GUIDED PARTIAL MASTECTOMY WITH AXILLARY SENTINEL LYMPH NODE BIOPSY Left 09/16/2014   Procedure: RADIOACTIVE SEED GUIDED PARTIAL MASTECTOMY WITH AXILLARY SENTINEL LYMPH NODE BIOPSY;  Surgeon: Almond Lint, MD;  Location:  SUR[facility]ERY C[facility];  Service: General;  Laterality: Left;   RECTAL EXAM UNDER ANESTHESIA N/A 12/22/2021   Procedure: ANORECTAL EXAM UNDER ANESTHESIA excision of perirectal mass  Hemorrhoidal ligation and removal;  Surgeon: Karie Soda, MD;  Location:  [facility];  Service: General;  Laterality: N/A;   THYROID LOBECTOMY N/A 02/10/2015   Procedure: RIGHT THYROID LOBECTOMY;  Surgeon: Almond Lint, MD;  Location: [facility];  Service: General;  Laterality: N/A;   TONSILLECTOMY     age 83 adenoids remved also   TUBAL LIGATION     yrs ago     No outpatient medications have been marked as taking for the 06/13/23 encounter (Office Visit) with Wendall Stade, MD.     Allergies:   Codeine, Hyaluronic acid  [collagen-chond-hyaluronic acid], Carvedilol, Felodipine, Lisinopril, Losartan potassium, Zolpidem, and Radiaplexrx [pyridoxine-zinc picolinate]   Social History   Tobacco Use   Smoking status: Never   Smokeless tobacco: Never  Vaping Use   Vaping status:  Never Used  Substance Use Topics   Alcohol use: No    Alcohol/week: 0.0 standard drinks of alcohol   Drug use: No     Family Hx: The patient's family history includes Breast cancer (age of onset: 12) in her sister; Breast cancer (age of onset: 41) in her sister; Cancer in her brother, father, and paternal uncle; Hyperlipidemia in her mother; Hypertension in her father and mother; Kidney cancer (age of onset: 6) in her sister; Melanoma (age of onset: 70) in her sister; Ovarian cancer (age of onset: 70) in an other family member; Stroke in her mother. There is no history of Colon cancer, CAD, Esophageal cancer, Stomach cancer, or Rectal cancer.  ROS:   Please see the history of present illness.    General:no colds or fevers, no weight changes Skin:no rashes or ulcers HEENT:no blurred vision, no congestion CV:see HPI PUL:see HPI GI:no diarrhea constipation or melena, no indigestion GU:no hematuria, no dysuria MS:no joint pain, no claudication Neuro:no syncope, no lightheadedness Endo:no diabetes, + thyroid disease  All other systems reviewed and are negative.   Prior CV studies:   The following studies were reviewed today:  Cardiac event monitor 4/20  Notes recorded by Wendall Stade, MD on 03/31/2019 at 12:04 PM EDT NSR rate PAC;s no PAF no significant arrhythmia  Echo 05/08/17 Study Conclusions   - Left ventricle: The cavity size was normal. Wall thickness was   normal. Systolic function was normal. The estimated ejection   fraction was in the range of 55% to 60%. Wall motion was normal;   there were no regional wall motion abnormalities. There was no   evidence of elevated ventricular filling pressure by Doppler   parameters. - Aortic valve: Trileaflet; mildly thickened, mildly calcified   leaflets. - Mitral valve: Calcified annulus. There was trivial regurgitation. - Left atrium: The atrium was mildly dilated. - Tricuspid valve: There was mild regurgitation.     Labs/Other Tests and Data Reviewed:    EKG:   02/16/19 afib rate 133 nonspecific ST changes    Recent Labs: 11/08/2022: ALT 17; Hemoglobin 12.8; Platelets 171.0 03/26/2023: BUN 23; Creatinine, Ser 0.92; Potassium 4.5; Sodium 140; TSH 2.60   Recent Lipid Panel Lab Results  Component Value Date/Time   CHOL 163 11/08/2022 10:09 AM   CHOL 185 09/14/2021 09:19 AM   TRIG 99.0 11/08/2022 10:09 AM   HDL 55.30 11/08/2022 10:09 AM   HDL 64 09/14/2021 09:19 AM   CHOLHDL 3 11/08/2022 10:09 AM   LDLCALC 87 11/08/2022 10:09 AM   LDLCALC 105 (H) 09/14/2021 09:19 AM   LDLDIRECT 177.2 08/04/2013 11:47 AM    Wt Readings from Last 3 Encounters:  03/26/23 195 lb 8 oz (88.7 kg)  11/08/22  201 lb 6 oz (91.3 kg)  05/25/22 200 lb (90.7 kg)     Objective:    Vital Signs:  LMP  (LMP Unknown)    Affect appropriate Healthy:  appears stated age HEENT: post right thyroidectomy  Neck supple with no adenopathy JVP normal no bruits no thyromegaly Lungs clear with no wheezing and good diaphragmatic motion Heart:  S1/S2 no murmur, no rub, gallop or click PMI normal Abdomen: benighn, BS positve, no tenderness, no AAA no bruit.  No HSM or HJR Distal pulses intact with no bruits No edema Neuro non-focal Skin warm and dry No muscular weakness   ASSESSMENT & PLAN:    PAF :  On xarelto and Toprol asymptomatic given age would not start AAT anticoagulation on xarelto.  Continue no bleeding issues Cr normal 0.92 03/26/23 Hct / PLT normal 11/08/22  Hypothyroid on replacement followed by PCP TSH 2.6  03/26/23 post right thyroidectomy for Hurthle cell neoplasm HLD:  On statin labs with primary  Breast Cancer:  remission post XRT and Arimidex 2015    Disposition:  Follow up in a year  Signed, Charlton Haws, MD  06/13/2023 9:47 AM    [facility]

## 2023-06-13 ENCOUNTER — Telehealth: Payer: Self-pay | Admitting: Internal Medicine

## 2023-06-13 ENCOUNTER — Ambulatory Visit: Payer: Medicare Other | Attending: Cardiovascular Disease | Admitting: Cardiovascular Disease

## 2023-06-13 VITALS — BP 122/78 | HR 88 | Ht 63.0 in | Wt 195.0 lb

## 2023-06-13 DIAGNOSIS — I480 Paroxysmal atrial fibrillation: Secondary | ICD-10-CM

## 2023-06-13 DIAGNOSIS — E782 Mixed hyperlipidemia: Secondary | ICD-10-CM | POA: Diagnosis not present

## 2023-06-13 MED ORDER — DIAZEPAM 5 MG PO TABS: 2.5000 mg | ORAL_TABLET | Freq: Every day | ORAL | 0 refills | Status: DC | PRN

## 2023-06-13 NOTE — Addendum Note (Signed)
Addended by: Willow Ora E on: 06/13/2023 02:17 PM   Modules accepted: Orders

## 2023-06-13 NOTE — Patient Instructions (Signed)

## 2023-06-13 NOTE — Telephone Encounter (Signed)
Spoke w/ Pt- informed of pcp recommendations. Pt verbalized understanding.

## 2023-06-13 NOTE — Telephone Encounter (Signed)
I think that is reasonable, PDMP okay, prescription sent. Please advise patient: Watch for excessive somnolence.

## 2023-06-13 NOTE — Telephone Encounter (Signed)
Pt called requesting 2 pills of valium to be sent to her pharmacy for sleep for her flight. This recommendation came from her cardiologist. Please Advise.

## 2023-06-13 NOTE — Telephone Encounter (Signed)
Please advise 

## 2023-07-03 ENCOUNTER — Telehealth: Payer: Self-pay | Admitting: Cardiovascular Disease

## 2023-07-03 ENCOUNTER — Encounter: Payer: Self-pay | Admitting: Internal Medicine

## 2023-07-03 DIAGNOSIS — H9190 Unspecified hearing loss, unspecified ear: Secondary | ICD-10-CM

## 2023-07-03 DIAGNOSIS — I480 Paroxysmal atrial fibrillation: Secondary | ICD-10-CM

## 2023-07-03 NOTE — Telephone Encounter (Signed)
Pt c/o swelling/edema: STAT if pt has developed SOB within 24 hours  If swelling, where is the swelling located?  Both feet  How much weight have you gained and in what time span?   About 2 lbs  Have you gained 2 pounds in a day or 5 pounds in a week?  Patient stated she gained about 2 lbs over 8 days  Do you have a log of your daily weights (if so, list)?    Are you currently taking a fluid pill?   Yes  Are you currently SOB?   Have you traveled recently in a car or plane for an extended period of time?  Yes  Patient stated she was dehydrated after travelling and she developed swelling.  Patient stated now that she is home her feet are back to normal.  Patient wants advice on next steps.

## 2023-07-03 NOTE — Telephone Encounter (Signed)
Called patient back about her message. Patient stated while she was out of town she had swelling in her feet. Patient stated she gained 2 lbs in 8 days. Patient stated now that she is back home the swelling in her feet is gone. Informed patient that the swelling maybe due to eating out a lot while out of town, and possibly having too much salt in her diet. Informed patient that we usually have patient's call if they gain 3 pounds in one day or 5 pounds in one week. Patient stated her daughter, who is a nurse was concerned, so she called. Informed her that a message would be sent to Dr. Eden Emms for any further advisement.

## 2023-07-03 NOTE — Telephone Encounter (Signed)
Left message for patient to call back  

## 2023-07-03 NOTE — Telephone Encounter (Signed)
Pt was returning nurse call and is requesting a callback. Please advise. 

## 2023-07-03 NOTE — Telephone Encounter (Signed)
Per Dr. Eden Emms, doubt significant issue. Can order echo, since she has not had one since 2018.   Left message for patient to call back. Will place order for echo.

## 2023-07-04 NOTE — Telephone Encounter (Signed)
Enter referral to AUDIOLOGY  Diagnosis decreased hearing

## 2023-07-04 NOTE — Addendum Note (Signed)
Addended by: Alysia Penna on: 07/04/2023 11:34 AM   Modules accepted: Orders

## 2023-07-24 ENCOUNTER — Ambulatory Visit (HOSPITAL_COMMUNITY): Payer: Medicare Other | Attending: Cardiovascular Disease

## 2023-07-24 DIAGNOSIS — I480 Paroxysmal atrial fibrillation: Secondary | ICD-10-CM | POA: Insufficient documentation

## 2023-07-24 LAB — ECHOCARDIOGRAM COMPLETE: S' Lateral: 3.8 cm

## 2023-08-05 ENCOUNTER — Ambulatory Visit: Payer: Medicare Other | Attending: Internal Medicine | Admitting: Audiologist

## 2023-08-05 DIAGNOSIS — H903 Sensorineural hearing loss, bilateral: Secondary | ICD-10-CM | POA: Diagnosis not present

## 2023-08-05 NOTE — Procedures (Signed)
  Outpatient Audiology and [facility] [address] [place], [place]  [address] [phone]  AUDIOLOGICAL  EVALUATION  NAME: Lori Joseph     DOB:   Apr 12, 1941      MRN: [id number]                                                                                     DATE: 08/05/2023     REFERENT: Wanda Plump, MD STATUS: Outpatient DIAGNOSIS: Sensorineural Hearing Loss Bilateral    History: [name] was seen for an audiological evaluation due to concerns her daughter has for her hearing. [name] denies any ear related symptoms or difficulty hearing. She feels she hears well. Her daughter feels she is missing words. Medical history shows no significant warning signs for hearing loss.  Evaluation:  Otoscopy showed a clear view of the tympanic membranes, bilaterally Tympanometry results were consistent with hypercompliance bilaterally   Audiometric testing was completed using Conventional Audiometry techniques with insert earphones and TDH headphones. Test results are consistent with normal hearing sloping after 1.5kHz to a severe sensorineural hearing loss. Speech Recognition Thresholds were obtained at 25dB HL in the right ear and at 25dB HL in the left ear. Word Recognition Testing was completed at  65dB HL and [name] scored 84% in the right ear and 80% in the left ear.    Results:  The test results were reviewed with [name]. She was given two copies of her test results and list of local hearing aid offices. [name] has sloping sensorineural hearing loss bilaterally. She is a candidate to try hearing aids. She is willing to try hearing aids.   Recommendations: 1.   See handout on hearing aids through [employer] and list of local hearing aid providers.    24 minutes spent testing and counseling on results.   If you have any questions please feel free to contact me at [phone].  Ammie Ferrier Audiologist, Au.D., CCC-A 08/05/2023  11:46 AM  Cc: Wanda Plump, MD

## 2023-08-13 NOTE — Telephone Encounter (Signed)
Patient had echo and given results.

## 2023-08-21 DIAGNOSIS — K629 Disease of anus and rectum, unspecified: Secondary | ICD-10-CM | POA: Diagnosis not present

## 2023-08-21 DIAGNOSIS — Z86010 Personal history of colonic polyps: Secondary | ICD-10-CM | POA: Diagnosis not present

## 2023-09-24 ENCOUNTER — Other Ambulatory Visit: Payer: Self-pay | Admitting: Internal Medicine

## 2023-10-01 ENCOUNTER — Other Ambulatory Visit: Payer: Self-pay | Admitting: Internal Medicine

## 2023-10-11 ENCOUNTER — Ambulatory Visit
Admission: EM | Admit: 2023-10-11 | Discharge: 2023-10-11 | Disposition: A | Discharge status: Home / Self Care | Payer: Medicare Other | Attending: Internal Medicine | Admitting: Internal Medicine

## 2023-10-11 ENCOUNTER — Other Ambulatory Visit: Payer: Self-pay

## 2023-10-11 ENCOUNTER — Ambulatory Visit: Payer: Medicare Other

## 2023-10-11 DIAGNOSIS — R059 Cough, unspecified: Secondary | ICD-10-CM | POA: Diagnosis not present

## 2023-10-11 DIAGNOSIS — J069 Acute upper respiratory infection, unspecified: Secondary | ICD-10-CM

## 2023-10-11 MED ORDER — BENZONATATE 100 MG PO CAPS: 100.0000 mg | ORAL_CAPSULE | Freq: Three times a day (TID) | ORAL | 0 refills | Status: DC | PRN

## 2023-10-11 MED ORDER — ALBUTEROL SULFATE HFA 108 (90 BASE) MCG/ACT IN AERS: 1.0000 | INHALATION_SPRAY | Freq: Four times a day (QID) | RESPIRATORY_TRACT | 0 refills | Status: AC | PRN

## 2023-10-11 NOTE — Discharge Instructions (Signed)
Chest x-ray is pending.  I will call if it is abnormal.  I have prescribed you cough medication and inhaler to take for symptoms.  As we discussed, this appears viral and should simply run its course with the help of medication.  Follow-up if any symptoms persist or worsen.

## 2023-10-11 NOTE — ED Triage Notes (Signed)
Pt states cough and congestion for the past 2 days worse at night.  Also c/o generalized weakness.  States she has been taking Mucinex and honey at home.

## 2023-10-11 NOTE — ED Provider Notes (Signed)
[facility]    CSN: [id number] Arrival date & time: 10/11/23  1119      History   Chief Complaint Chief Complaint  Patient presents with   Cough    HPI Lori VANZANTEN is a 82 y.o. female.   Patient presents with nasal congestion and cough that started 2 days ago.  Denies chest pain or shortness of breath.  Denies fever, body aches, chills.  Reports that she babysits her great grandchild who had similar symptoms.  Has taken Mucinex and over-the-counter remedies with minimal improvement in symptoms.  Denies formal diagnosis of asthma or COPD and patient denies that she smokes cigarettes.  Patient is requesting a chest x-ray.   Cough   Past Medical History:  Diagnosis Date   Anal condyloma Warts    Complication of anesthesia    slow to awaken after colonscopy yrs ago   Eczema    hands/legs   Family history of anesthesia complication    daughter hard to wake up   H/O acute pancreatitis    yrs ago   H/O retinal detachment    History of Anemia    History of COVID-19 10/2021   congestion, diarrhea, flu like symptoms x 2 weeks al symptoms resolved   History of external beam radiation therapy    11-11-2015  to  12-30-2014   left breast 50.4 gray, lumpectomy cavity boosted to 62.4 gray   Hurthle cell neoplasm of thyroid    Right  [name] thryoid 10-26-2021 epic, biopsy done 12-20-2021   Hyperlipidemia    Hypertension    Hypothyroidism 05/31/2015   Osteopenia    PAF (paroxysmal atrial fibrillation) (HCC) 03/2017   pressure increased in both eyes not glaucoma    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   Seasonal allergies    Vitamin D deficiency    Wears partial dentures upper and lower     Patient Active Problem List   Diagnosis Date Noted   Elevated LFTs 09/14/2021   Vitamin D deficiency 09/14/2021   Prediabetes 09/14/2021   Atrial fibrillation (HCC).Onset 03/2017  04/26/2017   Pulmonary infiltrates on CXR 01/30/2016   Upper airway cough syndrome 01/30/2016   PCP NOTES >>>>>>>>>>>>>>>> 08/25/2015   Hypothyroidism 05/31/2015   Family history of malignant neoplasm of breast 03/11/2015   Family history of malignant neoplasm of ovary 03/11/2015   PCP comments--R Chest wall and flank pain 12/31/2014   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, 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/2014   Chest pain 12/31/2014   Breast cancer of upper-outer quadrant of left female breast (HCC) 09/08/2014   Eczema 08/05/2014   Annual physical exam >>>>>>>>>>>>>>>>>>>>>>>> 06/18/2012   BCC (basal cell carcinoma of skin) 06/18/2012   OTHER&UNSPECIFIED DISEASES THE ORAL SOFT TISSUES 02/10/2011   HYPERLIPIDEMIA 01/12/2011   OSTEOPENIA 09/28/2009   COLONIC POLYPS, 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   Morbid obesity (HCC) 05/18/2009   Essential hypertension 10/15/2007   PANCREATITIS, HX OF 03/13/2007    Past Surgical History:  Procedure Laterality Date   BREAST BIOPSY Left 08/27/2014   BREAST BIOPSY Left 11/27/2016   BREAST LUMPECTOMY Left 09/15/2014   CHOLECYSTECTOMY  2000   colonscopy  2018   DILATION AND CURETTAGE OF UTERUS     yrs ago   LASER ABLATION CONDOLAMATA N/A 12/22/2021   Procedure: LASER REMOVAL/ABLATION CONDOLAMATA;  Surgeon: Karie Soda, MD;  Location:  [facility];  Service: General;  Laterality: N/A;   laser eye surgery, detached retina Left    yrs ago   RADIOACTIVE SEED GUIDED PARTIAL MASTECTOMY WITH AXILLARY SENTINEL LYMPH NODE BIOPSY Left 09/16/2014   Procedure: RADIOACTIVE SEED GUIDED PARTIAL MASTECTOMY WITH AXILLARY SENTINEL LYMPH NODE BIOPSY;  Surgeon: Almond Lint, MD;  Location: [facility];  Service: General;  Laterality: Left;   RECTAL EXAM UNDER ANESTHESIA N/A 12/22/2021   Procedure: ANORECTAL EXAM UNDER ANESTHESIA excision of perirectal mass  Hemorrhoidal ligation and removal;  Surgeon: Karie Soda, MD;   Location: [facility];  Service: General;  Laterality: N/A;   THYROID LOBECTOMY N/A 02/10/2015   Procedure: RIGHT THYROID LOBECTOMY;  Surgeon: Almond Lint, MD;  Location: [facility];  Service: General;  Laterality: N/A;   TONSILLECTOMY     age 76 adenoids remved also   TUBAL LIGATION     yrs ago    OB History     Gravida  2   Para  2   Term      Preterm      AB      Living         SAB      IAB      Ectopic      Multiple      Live Births               Home Medications    Prior to Admission medications   Medication Sig Start Date End Date Taking? Authorizing Provider  albuterol (VENTOLIN HFA) 108 (90 Base) MCG/ACT inhaler Inhale 1-2 puffs into the lungs every 6 (six) hours as needed for wheezing or shortness of breath. 10/11/23  Yes Bernardo Brayman, Rolly Salter E, FNP  benzonatate (TESSALON) 100 MG capsule Take 1 capsule (100 mg total) by mouth every 8 (eight) hours as needed for cough. 10/11/23  Yes Mirian Casco, Rolly Salter E, FNP  calcium carbonate 1250 MG capsule Take 2,500 mg by mouth every morning.     [provider]  cholecalciferol (VITAMIN D) 1000 UNITS tablet Take 2,500 Units by mouth every morning.     [provider]  diazepam (VALIUM) 5 MG tablet Take 0.5-1 tablets (2.5-5 mg total) by mouth daily as needed for anxiety (flying). 06/13/23   Wanda Plump, MD  dorzolamide-timolol (COSOPT) 22.3-6.8 MG/ML ophthalmic solution Place 1 drop into both eyes every morning.     [provider]  hydrochlorothiazide (HYDRODIURIL) 25 MG tablet Take 0.5 tablets (12.5 mg total) by mouth daily. 04/01/23   Wanda Plump, MD  latanoprost (XALATAN) 0.005 % ophthalmic solution PLACE 1 DROP INTO BOTH EYES NIGHTLY. 12/03/16   [provider]  levothyroxine (SYNTHROID) 25 MCG tablet Take 1 tablet (25 mcg total) by mouth daily before breakfast. 05/28/23   Wanda Plump, MD  Melatonin 10 MG TABS Take by mouth at bedtime.    [provider]  metoprolol succinate  (TOPROL-XL) 50 MG 24 hr tablet Take 1 tablet (50 mg total) by mouth daily. Take with or immediately following a meal 09/24/23   Wanda Plump, MD  Multiple Vitamin (MULTIVITAMIN) tablet Take 1 tablet by mouth every morning.    [provider]  pravastatin (PRAVACHOL) 40 MG tablet Take 1 tablet (40 mg total) by mouth daily. 04/30/23   Wanda Plump, MD  rivaroxaban (XARELTO) 20 MG TABS tablet Take 1 tablet (20 mg total) by mouth daily with supper. 10/01/23   Wanda Plump, MD  Family History Family History  Problem Relation Age of Onset   Stroke Mother    Hyperlipidemia Mother    Hypertension Mother    Hypertension Father    Cancer Father        lung cancer ; smoker   Breast cancer Sister 37   Kidney cancer Sister 54   Breast cancer Sister 65       passed 70   Melanoma Sister 53   Cancer Brother        3 brothers with lung cancer, all smokers   Cancer Paternal Uncle        2 pat uncles with lung cancer, smokers   Ovarian cancer Other 52       niece with ovarian cancer (related through sister with breast cancer)   Colon cancer Neg Hx    CAD Neg Hx    Esophageal cancer Neg Hx    Stomach cancer Neg Hx    Rectal cancer Neg Hx     Social History Social History   Tobacco Use   Smoking status: Never   Smokeless tobacco: Never  Vaping Use   Vaping status: Never Used  Substance Use Topics   Alcohol use: No    Alcohol/week: 0.0 standard drinks of alcohol   Drug use: No     Allergies   Codeine, Hyaluronic acid  [collagen-chond-hyaluronic acid], Carvedilol, Felodipine, Lisinopril, Losartan potassium, Zolpidem, and Radiaplexrx [pyridoxine-zinc picolinate]   Review of Systems Review of Systems Per HPI  Physical Exam Triage Vital Signs ED Triage Vitals  Encounter Vitals Group     BP 10/11/23 1221 118/78     Systolic BP Percentile --      Diastolic BP Percentile --      Pulse Rate 10/11/23 1221 83     Resp 10/11/23 1221 16     Temp 10/11/23 1221 98.5 F (36.9 C)      Temp Source 10/11/23 1221 Oral     SpO2 10/11/23 1221 100 %     Weight --      Height --      Head Circumference --      Peak Flow --      Pain Score 10/11/23 1224 0     Pain Loc --      Pain Education --      Exclude from Growth Chart --    No data found.  Updated Vital Signs BP 118/78 (BP Location: Left Arm)   Pulse 83   Temp 98.5 F (36.9 C) (Oral)   Resp 16   LMP  (LMP Unknown)   SpO2 100%   Visual Acuity Right Eye Distance:   Left Eye Distance:   Bilateral Distance:    Right Eye Near:   Left Eye Near:    Bilateral Near:     Physical Exam Constitutional:      General: She is not in acute distress.    Appearance: Normal appearance. She is not toxic-appearing or diaphoretic.  HENT:     Head: Normocephalic and atraumatic.     Right Ear: Tympanic membrane and ear canal normal.     Left Ear: Tympanic membrane and ear canal normal.     Nose: Congestion present.     Mouth/Throat:     Mouth: Mucous membranes are moist.     Pharynx: No posterior oropharyngeal erythema.  Eyes:     Extraocular Movements: Extraocular movements intact.     Conjunctiva/sclera: Conjunctivae normal.     Pupils: Pupils are equal,  round, and reactive to light.  Cardiovascular:     Rate and Rhythm: Normal rate and regular rhythm.     Pulses: Normal pulses.     Heart sounds: Normal heart sounds.  Pulmonary:     Effort: Pulmonary effort is normal. No respiratory distress.     Breath sounds: No stridor. Wheezing present. No rhonchi or rales.     Comments: Mild wheezing noted to auscultation to right lung.  Abdominal:     General: Abdomen is flat. Bowel sounds are normal.     Palpations: Abdomen is soft.  Musculoskeletal:        General: Normal range of motion.     Cervical back: Normal range of motion.  Skin:    General: Skin is warm and dry.  Neurological:     General: No focal deficit present.     Mental Status: She is alert and oriented to person, place, and time. Mental status is  at baseline.  Psychiatric:        Mood and Affect: Mood normal.        Behavior: Behavior normal.      UC Treatments / Results  Labs (all labs ordered are listed, but only abnormal results are displayed) Labs Reviewed - No data to display  EKG   Radiology DG Chest 2 View  Result Date: 10/11/2023 CLINICAL DATA:  Cough. EXAM: CHEST - 2 VIEW COMPARISON:  August 19, 2018. FINDINGS: The heart size and mediastinal contours are within normal limits. Both lungs are clear. The visualized skeletal structures are unremarkable. IMPRESSION: No active cardiopulmonary disease. Electronically Signed   By: Lupita Raider M.D.   On: 10/11/2023 16:30    Procedures Procedures (including critical care time)  Medications Ordered in UC Medications - No data to display  Initial Impression / Assessment and Plan / UC Course  I have reviewed the triage vital signs and the nursing notes.  Pertinent labs & imaging results that were available during my care of the patient were reviewed by me and considered in my medical decision making (see chart for details).     Patient presents with symptoms likely from a viral upper respiratory infection.  Patient is nontoxic appearing and not in need of emergent medical intervention.  Chest x-ray completed that was negative for any acute cardiopulmonary process.  I do hear a little bit of wheezing in the right lung so will treat with albuterol inhaler as patient reports she has taken this previously and tolerated well.  Patient declined covid and flu testing.  Recommended symptom control with medications, supportive care. symptom management.Bbenzonatate prescribed to take as needed for cough.  Return if symptoms fail to improve in 1-2 weeks or you develop shortness of breath, chest pain, severe headache. Patient states understanding and is agreeable.  Discharged with PCP followup.  Final Clinical Impressions(s) / UC Diagnoses   Final diagnoses:  Viral upper  respiratory tract infection with cough     Discharge Instructions      Chest x-ray is pending.  I will call if it is abnormal.  I have prescribed you cough medication and inhaler to take for symptoms.  As we discussed, this appears viral and should simply run its course with the help of medication.  Follow-up if any symptoms persist or worsen.    ED Prescriptions     Medication Sig Dispense Auth. Provider   benzonatate (TESSALON) 100 MG capsule Take 1 capsule (100 mg total) by mouth every 8 (eight) hours as  needed for cough. 21 capsule [place], [place] E, [place]   albuterol (VENTOLIN HFA) 108 (90 Base) MCG/ACT inhaler Inhale 1-2 puffs into the lungs every 6 (six) hours as needed for wheezing or shortness of breath. 1 each Gustavus Bryant, [place]      PDMP not reviewed this encounter.   Gustavus Bryant, [place] 10/11/23 [phone]

## 2023-10-14 ENCOUNTER — Ambulatory Visit (INDEPENDENT_AMBULATORY_CARE_PROVIDER_SITE_OTHER): Payer: Medicare Other | Admitting: Family Medicine

## 2023-10-14 ENCOUNTER — Encounter: Payer: Self-pay | Admitting: Family Medicine

## 2023-10-14 ENCOUNTER — Telehealth: Payer: Self-pay | Admitting: Internal Medicine

## 2023-10-14 VITALS — BP 140/85 | HR 96 | Temp 98.1°F | Ht 63.0 in | Wt 191.0 lb

## 2023-10-14 DIAGNOSIS — J988 Other specified respiratory disorders: Secondary | ICD-10-CM | POA: Diagnosis not present

## 2023-10-14 MED ORDER — DOXYCYCLINE HYCLATE 100 MG PO TABS: 100.0000 mg | ORAL_TABLET | Freq: Two times a day (BID) | ORAL | 0 refills | Status: AC

## 2023-10-14 NOTE — Patient Instructions (Signed)
Adding doxycycline (antibiotic)- take with food and water  Continue supportive measures including rest, hydration, humidifier use, steam showers, warm compresses to sinuses, warm liquids with lemon and honey, and over-the-counter cough, cold, and analgesics as needed.   Over the counter medications that may be helpful for symptoms:  Guaifenesin 1200 mg extended release tabs twice daily, with plenty of water For cough and congestion Brand name: Mucinex   Pseudoephedrine 30 mg, one or two tabs every 4 to 6 hours For sinus congestion Brand name: Sudafed You must get this from the pharmacy counter.  Oxymetazoline nasal spray each morning, one spray in each nostril, for NO MORE THAN 3 days  For nasal and sinus congestion Brand name: Afrin Saline nasal spray or Saline Nasal Irrigation (Netti Pot, etc) 3-5 times a day For nasal and sinus congestion Brand names: Ocean or AYR Fluticasone nasal spray OR Mometasone nasal spray OR Triamcinolone Acetonide nasal spray - follow directions on the packaging For nasal and sinus congestion Brand name: Flonase, Nasonex, Nasacort Warm salt water gargles  For sore throat Every few hours as needed Alternate ibuprofen 400-600 mg and acetaminophen 1000 mg every 6 hours For fever, body aches, headache Brand names: Motrin or Advil and Tylenol Dextromethorphan 12-hour cough version 30 mg every 12 hours  For cough Brand name: Delsym Stop all other cold medications for now (Nyquil, Dayquil, Tylenol Cold, Theraflu, etc) and other non-prescription cough/cold preparations. Many of these have the same ingredients listed above and could cause an overdose of medication.   Herbal treatments that have been shown to be helpful in some patients include: Vitamin C 1000 mg per day Zinc 100 mg per day Quercetin 25-500 mg twice a day Melatonin 5-10mg  at bedtime Honey Green Tea  General Instructions Allow your body to rest Drink PLENTY of fluids Typically, we are the  most contagious 1-2 days before symptoms start through the first 2-3 days of most severe symptoms. Per CDC guidelines, you can return to school/work when symptoms have started to improve and you have been fever-free for 24 hours. However, recommend you continue extra precautions for the following 5 days (frequent hand hygiene, masking, covering coughs/sneezes, minimize exposure to immunocompromised individuals, etc).  If you develop severe shortness of breath, uncontrolled fevers, coughing up blood, confusion, chest pain, or signs of dehydration (such as significantly decreased urine amounts or dizziness with standing) please go to the nearest ER.

## 2023-10-14 NOTE — Telephone Encounter (Signed)
Initial Comment Caller states went to [facility] Fri for congestion, chest xray was clear; was given cough pill; still has congestion; when gets up has to sit down d/t feeling faint; 6th day; not dizzy, feels like blacking out; has AFIB Translation No Nurse Assessment Nurse: Henri Medal, RN, Amy Date/Time (Eastern Time): 10/14/2023 9:06:05 AM Confirm and document reason for call. If symptomatic, describe symptoms. ---Caller states she has been real sick for 6 days. She went to [facility] Friday because she is still real congested & coughing a lot. The prescribed Benzonatate pills. It has not helped. The pt. has been feeling faint when she gets up & it got worse after taking this pill. She had not been eating good, lost a couple lbs., but she did start eating soup, rice, & broth recently. She did eat this morning. When she got up to make coffee, she had to sit down real fast, because she felt like she was going to faint. She does not really feel dizzy, but feels like everything is shutting down. She can't really describe it. Her heart is not racing. Does the patient have any new or worsening symptoms? ---Yes Will a triage be completed? ---Yes Related visit to physician within the last 2 weeks? ---Yes Does the PT have any chronic conditions? (i.e. diabetes, asthma, this includes High risk factors for pregnancy, etc.) ---Yes List chronic conditions. ---A-Fib Is this a behavioral health or substance abuse call? ---No PLEASE NOTE: All timestamps contained within this report are represented as [place] Standard Time. CONFIDENTIALTY NOTICE: This fax transmission is intended only for the addressee. It contains information that is legally privileged, confidential or otherwise protected from use or disclosure. If you are not the intended recipient, you are strictly prohibited from reviewing, disclosing, copying using or disseminating any of this information or taking any action in reliance on or regarding this  information. If you have received this fax in error, please notify us immediately by telephone so that we can arrange for its return to [name]. Phone: [phone], Toll-Free: [phone], Fax: [phone] Page: 2 of 2 Call Id: [id number] Guidelines Guideline Title Affirmed Question Affirmed Notes Nurse Date/Time Lamount Cohen Time) Dizziness - Lightheadedness [1] MODERATE dizziness (e.g., interferes with normal activities) AND [2] has NOT been evaluated by doctor (or NP/PA) for this (Exception: Dizziness caused by heat exposure, sudden standing, or poor fluid intake.) Lovelace, RN, Amy 10/14/2023 9:06:50 AM Disp. Time Lamount Cohen Time) Disposition Final User 10/14/2023 9:03:17 AM Send to Urgent Hansel Starling 10/14/2023 9:14:05 AM See PCP within 24 Hours Yes Lovelace, RN, Amy Final Disposition 10/14/2023 9:14:05 AM See PCP within 24 Hours Yes Lovelace, RN, Amy Caller Disagree/Comply Comply Caller Understands Yes PreDisposition InappropriateToAsk Care Advice Given Per Guideline SEE PCP WITHIN 24 HOURS: * IF OFFICE WILL BE OPEN: You need to be examined within the next 24 hours. Call your doctor (or NP/PA) when the office opens and make an appointment. DRINK FLUIDS: * Drink several glasses of fruit juice, other clear fluids or water. * This will improve hydration and blood sugar levels. LIE DOWN AND REST: * Lie down with feet elevated for 1 hour. * This will improve blood flow through the body and to the brain. CALL BACK IF: * You pass out (faint) or are too weak to stand * You become worse CARE ADVICE given per Dizziness - Lightheadedness (Adult) guideline. Referrals REFERRED TO PCP OFFICE

## 2023-10-14 NOTE — Telephone Encounter (Signed)
Appt scheduled w/ Lori Joseph today

## 2023-10-14 NOTE — Telephone Encounter (Signed)
Pt called and stated that on Friday she was seen at Urgent Care for congestion. She explained that she had a chest x-ray and the doctors informed her that everything was good. However, this morning she stated that she still has sxs and feels that it is difficult to walk as she feels faint every time she stands. I routed our call to Triage. Please call and advise pt.

## 2023-10-14 NOTE — Progress Notes (Signed)
Acute Office Visit  Subjective:     Patient ID: Lori Joseph, female    DOB: Jan 03, 1941, 82 y.o.   MRN: [id number]  Chief Complaint  Patient presents with   Nasal Congestion    HPI Patient is in today for URI.   Discussed the use of AI scribe software for clinical note transcription with the patient, who gave verbal consent to proceed.  History of Present Illness   The patient, with an unspecified medical history, has been experiencing a worsening illness over the past week. She initially sought care at an urgent care center where she received an albuterol inhaler and Tessalon Perles for her symptoms. However, she reports no improvement with these treatments.  The patient describes a productive cough with thick, white sputum. She also reports a single episode of fever, reaching just over 100 degrees Fahrenheit. Accompanying these symptoms is a significant weakness and fatigue, to the point of near syncope when performing simple tasks such as making coffee. She also reports a decreased appetite, managing to consume only toast for a couple of days. However, she has been able to maintain some hydration and nutrition with the help of a neighbor who has been providing chicken broth and rice.  The patient also reports a headache. She also notes nasal congestion and postnasal drainage. The patient has been trying to manage her symptoms with Mucinex, but reports no improvement. She has been in contact with a grandchild who was recently ill, suggesting a possible source of infection.            ROS All review of systems negative except what is listed in the HPI      Objective:    BP (!) 140/85   Pulse 96   Temp 98.1 F (36.7 C) (Oral)   Ht 5\' 3"  (1.6 m)   Wt 191 lb (86.6 kg)   LMP  (LMP Unknown)   SpO2 97%   BMI 33.83 kg/m    Physical Exam Vitals reviewed.  Constitutional:      Appearance: Normal appearance.  HENT:     Head: Normocephalic and atraumatic.     Nose:  Rhinorrhea present.  Cardiovascular:     Rate and Rhythm: Normal rate and regular rhythm.  Pulmonary:     Effort: No respiratory distress.     Breath sounds: Wheezing and rhonchi present.  Musculoskeletal:     Cervical back: Normal range of motion. No tenderness.  Lymphadenopathy:     Cervical: No cervical adenopathy.  Neurological:     General: No focal deficit present.     Mental Status: She is alert and oriented to person, place, and time. Mental status is at baseline.  Psychiatric:        Mood and Affect: Mood normal.        Behavior: Behavior normal.     No results found for any visits on 10/14/23.      Assessment & Plan:   Problem List Items Addressed This Visit   None Visit Diagnoses     Respiratory infection    -  Primary   Relevant Medications   doxycycline (VIBRA-TABS) 100 MG tablet      Persistent symptoms for a week with thick white sputum, fatigue, and weakness. No significant fever. Chest x-ray from urgent care was normal 3 days ago. Wheezing noted on right side during examination. -Start antibiotic therapy. -Continue Albuterol inhaler as needed for wheezing. -Resume Mucinex and consider Delsym for cough. -Consider repeat chest x-ray  if no improvement with antibiotics.      Meds ordered this encounter  Medications   doxycycline (VIBRA-TABS) 100 MG tablet    Sig: Take 1 tablet (100 mg total) by mouth 2 (two) times daily for 10 days.    Dispense:  20 tablet    Refill:  0    Order Specific Question:   Supervising Provider    Answer:   Danise Edge A [[id number]]    Return if symptoms worsen or fail to improve.  Clayborne Dana, NP

## 2023-10-23 ENCOUNTER — Telehealth: Payer: Self-pay | Admitting: Internal Medicine

## 2023-10-23 DIAGNOSIS — N898 Other specified noninflammatory disorders of vagina: Secondary | ICD-10-CM

## 2023-10-23 MED ORDER — MICONAZOLE NITRATE 4 % VA CREA: 1.0000 | TOPICAL_CREAM | Freq: Every evening | VAGINAL | 1 refills | Status: AC

## 2023-10-23 NOTE — Addendum Note (Signed)
Addended by: Hyman Hopes B on: 10/23/2023 04:51 PM   Modules accepted: Orders

## 2023-10-23 NOTE — Telephone Encounter (Signed)
She can't take Diflucan because of her Xarelto. Sending in topical treatment instead.

## 2023-10-23 NOTE — Telephone Encounter (Signed)
Patient made aware.

## 2023-10-23 NOTE — Telephone Encounter (Signed)
Patient said she has gotten a yeast infection from the antibiotic(still has 2 days left). Pt wants to know if something can be called in to help with the yeast infection. [facility] on [address]. Please call pt to advise

## 2023-10-31 DIAGNOSIS — L821 Other seborrheic keratosis: Secondary | ICD-10-CM | POA: Diagnosis not present

## 2023-10-31 DIAGNOSIS — L648 Other androgenic alopecia: Secondary | ICD-10-CM | POA: Diagnosis not present

## 2023-10-31 DIAGNOSIS — D225 Melanocytic nevi of trunk: Secondary | ICD-10-CM | POA: Diagnosis not present

## 2023-10-31 DIAGNOSIS — Z85828 Personal history of other malignant neoplasm of skin: Secondary | ICD-10-CM | POA: Diagnosis not present

## 2023-10-31 DIAGNOSIS — L814 Other melanin hyperpigmentation: Secondary | ICD-10-CM | POA: Diagnosis not present

## 2023-11-03 ENCOUNTER — Emergency Department (HOSPITAL_COMMUNITY)
Admission: EM | Admit: 2023-11-03 | Discharge: 2023-11-03 | Disposition: A | Discharge status: Home / Self Care | Payer: Medicare Other | Attending: Emergency Medicine | Admitting: Emergency Medicine

## 2023-11-03 ENCOUNTER — Emergency Department (HOSPITAL_COMMUNITY): Payer: Medicare Other

## 2023-11-03 ENCOUNTER — Other Ambulatory Visit: Payer: Self-pay

## 2023-11-03 DIAGNOSIS — I10 Essential (primary) hypertension: Secondary | ICD-10-CM | POA: Insufficient documentation

## 2023-11-03 DIAGNOSIS — R55 Syncope and collapse: Secondary | ICD-10-CM | POA: Diagnosis not present

## 2023-11-03 DIAGNOSIS — Z79899 Other long term (current) drug therapy: Secondary | ICD-10-CM | POA: Insufficient documentation

## 2023-11-03 LAB — BASIC METABOLIC PANEL
Anion gap: 8 (ref 5–15)
BUN: 18 mg/dL (ref 8–23)
CO2: 29 mmol/L (ref 22–32)
Calcium: 9.1 mg/dL (ref 8.9–10.3)
Chloride: 102 mmol/L (ref 98–111)
Creatinine, Ser: 0.93 mg/dL (ref 0.44–1.00)
GFR, Estimated: >60 mL/min (ref >60)
Glucose, Bld: 112 mg/dL — ABNORMAL HIGH (ref 70–99)
Potassium: 3.9 mmol/L (ref 3.5–5.1)
Sodium: 139 mmol/L (ref 135–145)

## 2023-11-03 LAB — URINALYSIS, ROUTINE W REFLEX MICROSCOPIC
Bilirubin Urine: NEGATIVE
Glucose, UA: NEGATIVE mg/dL
Hgb urine dipstick: NEGATIVE
Ketones, ur: NEGATIVE mg/dL
Leukocytes,Ua: NEGATIVE
Nitrite: NEGATIVE
Protein, ur: NEGATIVE mg/dL
Specific Gravity, Urine: 1.012 (ref 1.005–1.030)
pH: 5 (ref 5.0–8.0)

## 2023-11-03 LAB — CBC
HCT: 37.2 % (ref 36.0–46.0)
Hemoglobin: 11.9 g/dL — ABNORMAL LOW (ref 12.0–15.0)
MCH: 31.2 pg (ref 26.0–34.0)
MCHC: 32 g/dL (ref 30.0–36.0)
MCV: 97.4 fL (ref 80.0–100.0)
Platelets: 145 10*3/uL — ABNORMAL LOW (ref 150–400)
RBC: 3.82 MIL/uL — ABNORMAL LOW (ref 3.87–5.11)
RDW: 13.8 % (ref 11.5–15.5)
WBC: 5.6 10*3/uL (ref 4.0–10.5)
nRBC: 0 % (ref 0.0–0.2)

## 2023-11-03 LAB — CBG MONITORING, ED: Glucose-Capillary: 108 mg/dL — ABNORMAL HIGH (ref 70–99)

## 2023-11-03 NOTE — Discharge Instructions (Signed)
The Tests today in the ED were reassuring.  Follow-up with your cardiologist to be rechecked.  Return to the ED if you have any recurrent episodes.

## 2023-11-03 NOTE — ED Provider Notes (Signed)
[place] EMERGENCY DEPARTMENT AT [facility] Provider Note   CSN: [id number] Arrival date & time: 11/03/23  1717     History  Chief Complaint  Patient presents with   Near Syncope    Lori Joseph is a 82 y.o. female.   Near Syncope     Patient has a history of hypertension pancreatitis hyperlipidemia atrial fibrillation who presents to the ED for an episode of near syncope.  Patient was at the grocery store.  She was standing in line waiting for rotisserie chicken.  She started to feel clammy faint and lightheaded as if she was going to pass out.  Patient was helped to a chair.  She was given something to drink.  Patient states her symptoms have now resolved.  She is not having any chest pain.  She is not having any abdominal pain.  She is not having fevers or chills.  She does not feel short of breath.  She does not have any focal numbness or weakness.  Patient has not noticed any blood in her stool or dark stools.  Patient has been dealing with a respiratory infection recently.  She has finished her course of antibiotics but overall feels like that is getting better  Home Medications Prior to Admission medications   Medication Sig Start Date End Date Taking? Authorizing Provider  albuterol (VENTOLIN HFA) 108 (90 Base) MCG/ACT inhaler Inhale 1-2 puffs into the lungs every 6 (six) hours as needed for wheezing or shortness of breath. 10/11/23   Gustavus Bryant, FNP  benzonatate (TESSALON) 100 MG capsule Take 1 capsule (100 mg total) by mouth every 8 (eight) hours as needed for cough. 10/11/23   Gustavus Bryant, FNP  calcium carbonate 1250 MG capsule Take 2,500 mg by mouth every morning.     [provider]  cholecalciferol (VITAMIN D) 1000 UNITS tablet Take 2,500 Units by mouth every morning.     [provider]  diazepam (VALIUM) 5 MG tablet Take 0.5-1 tablets (2.5-5 mg total) by mouth daily as needed for anxiety (flying). 06/13/23   Wanda Plump, MD   dorzolamide-timolol (COSOPT) 22.3-6.8 MG/ML ophthalmic solution Place 1 drop into both eyes every morning.     [provider]  hydrochlorothiazide (HYDRODIURIL) 25 MG tablet Take 0.5 tablets (12.5 mg total) by mouth daily. 04/01/23   Wanda Plump, MD  latanoprost (XALATAN) 0.005 % ophthalmic solution PLACE 1 DROP INTO BOTH EYES NIGHTLY. 12/03/16   [provider]  levothyroxine (SYNTHROID) 25 MCG tablet Take 1 tablet (25 mcg total) by mouth daily before breakfast. 05/28/23   Wanda Plump, MD  Melatonin 10 MG TABS Take by mouth at bedtime.    [provider]  metoprolol succinate (TOPROL-XL) 50 MG 24 hr tablet Take 1 tablet (50 mg total) by mouth daily. Take with or immediately following a meal 09/24/23   Wanda Plump, MD  Multiple Vitamin (MULTIVITAMIN) tablet Take 1 tablet by mouth every morning.    [provider]  pravastatin (PRAVACHOL) 40 MG tablet Take 1 tablet (40 mg total) by mouth daily. 04/30/23   Wanda Plump, MD  rivaroxaban (XARELTO) 20 MG TABS tablet Take 1 tablet (20 mg total) by mouth daily with supper. 10/01/23   Wanda Plump, MD      Allergies    Codeine, Hyaluronic acid  [collagen-chond-hyaluronic acid], Carvedilol, Felodipine, Lisinopril, Losartan potassium, Zolpidem, and Radiaplexrx [pyridoxine-zinc picolinate]    Review of Systems   Review of  Systems  Cardiovascular:  Positive for near-syncope.    Physical Exam Updated Vital Signs BP (!) 156/90   Pulse 83   Temp 97.6 F (36.4 C) (Oral)   Resp 14   Ht 1.6 m (5\' 3" )   Wt 86.6 kg   LMP  (LMP Unknown)   SpO2 99%   BMI 33.83 kg/m  Physical Exam Vitals and nursing note reviewed.  Constitutional:      Appearance: She is well-developed. She is not diaphoretic.  HENT:     Head: Normocephalic and atraumatic.     Right Ear: External ear normal.     Left Ear: External ear normal.  Eyes:     General: No scleral icterus.       Right eye: No discharge.        Left eye: No discharge.      Conjunctiva/sclera: Conjunctivae normal.  Neck:     Trachea: No tracheal deviation.  Cardiovascular:     Rate and Rhythm: Normal rate and regular rhythm.  Pulmonary:     Effort: Pulmonary effort is normal. No respiratory distress.     Breath sounds: Normal breath sounds. No stridor. No wheezing or rales.  Abdominal:     General: Bowel sounds are normal. There is no distension.     Palpations: Abdomen is soft.     Tenderness: There is no abdominal tenderness. There is no guarding or rebound.  Musculoskeletal:        General: No tenderness or deformity.     Cervical back: Neck supple.  Skin:    General: Skin is warm and dry.     Findings: No rash.  Neurological:     General: No focal deficit present.     Mental Status: She is alert.     Cranial Nerves: No cranial nerve deficit, dysarthria or facial asymmetry.     Sensory: No sensory deficit.     Motor: No abnormal muscle tone or seizure activity.     Coordination: Coordination normal.  Psychiatric:        Mood and Affect: Mood normal.     ED Results / Procedures / Treatments   Labs (all labs ordered are listed, but only abnormal results are displayed) Labs Reviewed  BASIC METABOLIC PANEL - Abnormal; Notable for the following components:      Result Value   Glucose, Bld 112 (*)    All other components within normal limits  CBC - Abnormal; Notable for the following components:   RBC 3.82 (*)    Hemoglobin 11.9 (*)    Platelets 145 (*)    All other components within normal limits  CBG MONITORING, ED - Abnormal; Notable for the following components:   Glucose-Capillary 108 (*)    All other components within normal limits  URINALYSIS, ROUTINE W REFLEX MICROSCOPIC    EKG EKG Interpretation Date/Time:  Sunday November 03 2023 17:38:44 EST Ventricular Rate:  78 PR Interval:    QRS Duration:  89 QT Interval:  384 QTC Calculation: 438 R Axis:   41  Text Interpretation: Atrial fibrillation Low voltage, precordial leads  No significant change since last tracing Confirmed by Linwood Dibbles [phone]) on 11/03/2023 5:41:17 PM  Radiology DG Chest 2 View  Result Date: 11/03/2023 CLINICAL DATA:  Near syncope EXAM: CHEST - 2 VIEW COMPARISON:  10/11/2023 FINDINGS: Heart a mediastinal contours within normal limits. Biapical scarring. No acute confluent opacities or effusions. No acute bony abnormality. IMPRESSION: No active cardiopulmonary disease. Electronically Signed   By:  Charlett Nose M.D.   On: 11/03/2023 19:03    Procedures Procedures    Medications Ordered in ED Medications - No data to display  ED Course/ Medical Decision Making/ A&P Clinical Course as of 11/03/23 2200  Sun Nov 03, 2023  2132 No significant metabolic abnormalities.  CBC shows hemoglobin slightly decreased at 11.9 but I doubt clinically significant [JK]  2141 UA negative [JK]    Clinical Course User Index [JK] Linwood Dibbles, MD                                 Medical Decision Making Problems Addressed: Near syncope: acute illness or injury that poses a threat to life or bodily functions  Amount and/or Complexity of Data Reviewed Labs: ordered. Decision-making details documented in ED Course. Radiology: ordered and independent interpretation performed.  Patient presented for a near syncopal episode.  This occurred while she had been standing on line.  Patient felt lightheaded nauseated but did not actually lose consciousness.  Patient was asymptomatic here in the ED.  Her ED workup is reassuring.  She has atrial fibrillation but there is no tachycardia or bradycardia noted.  She does not have any significant anemia or dehydration.  Patient has remained asymptomatic.    He is feeling well and would like to go home.  Low suspicion for serious etiology at this time.  Evaluation and diagnostic testing in the emergency department does not suggest an emergent condition requiring admission or immediate intervention beyond what has been performed at  this time.  The patient is safe for discharge and has been instructed to return immediately for worsening symptoms, change in symptoms or any other concerns.         Final Clinical Impression(s) / ED Diagnoses Final diagnoses:  Near syncope    Rx / DC Orders ED Discharge Orders     None         Linwood Dibbles, MD 11/03/23 2202

## 2023-11-03 NOTE — ED Triage Notes (Addendum)
Pt arrived via POV. C/o near syncope while at [facility] waiting for rotisserie chicken. Reports feeling clammy, faint, and dizzy.  Hx afib.   AOx4

## 2023-11-04 ENCOUNTER — Telehealth: Payer: Self-pay | Admitting: *Deleted

## 2023-11-04 NOTE — Transitions of Care (Post Inpatient/ED Visit) (Signed)
   11/04/2023  Name: Lori Joseph MRN: [id number] DOB: 04/18/1941  Today's TOC FU Call Status: Today's TOC FU Call Status:: Unsuccessful Call (1st Attempt) Unsuccessful Call (1st Attempt) Date: 11/04/23  Attempted to reach the patient regarding the most recent Inpatient/ED visit.  Follow Up Plan: Additional outreach attempts will be made to reach the patient to complete the Transitions of Care (Post Inpatient/ED visit) call.   Signature Letisha Yera, [facility]

## 2023-11-13 ENCOUNTER — Ambulatory Visit (INDEPENDENT_AMBULATORY_CARE_PROVIDER_SITE_OTHER): Payer: Medicare Other | Admitting: Internal Medicine

## 2023-11-13 ENCOUNTER — Encounter: Payer: Self-pay | Admitting: Internal Medicine

## 2023-11-13 VITALS — BP 138/80 | HR 81 | Temp 97.4°F | Resp 18 | Ht 63.0 in | Wt 192.0 lb

## 2023-11-13 DIAGNOSIS — R55 Syncope and collapse: Secondary | ICD-10-CM

## 2023-11-13 DIAGNOSIS — I10 Essential (primary) hypertension: Secondary | ICD-10-CM

## 2023-11-13 DIAGNOSIS — R739 Hyperglycemia, unspecified: Secondary | ICD-10-CM

## 2023-11-13 DIAGNOSIS — Z0000 Encounter for general adult medical examination without abnormal findings: Secondary | ICD-10-CM

## 2023-11-13 DIAGNOSIS — I480 Paroxysmal atrial fibrillation: Secondary | ICD-10-CM

## 2023-11-13 DIAGNOSIS — E038 Other specified hypothyroidism: Secondary | ICD-10-CM

## 2023-11-13 DIAGNOSIS — Z0001 Encounter for general adult medical examination with abnormal findings: Secondary | ICD-10-CM

## 2023-11-13 LAB — LIPID PANEL
Cholesterol: 181 mg/dL (ref 0–200)
HDL: 60.2 mg/dL (ref >39.00)
LDL Cholesterol: 104 mg/dL — ABNORMAL HIGH (ref 0–99)
NonHDL: 120.66
Total CHOL/HDL Ratio: 3
Triglycerides: 85 mg/dL (ref 0.0–149.0)
VLDL: 17 mg/dL (ref 0.0–40.0)

## 2023-11-13 LAB — ALT: ALT: 15 U/L (ref 0–35)

## 2023-11-13 LAB — TSH: TSH: 3.79 u[IU]/mL (ref 0.35–5.50)

## 2023-11-13 LAB — AST: AST: 20 U/L (ref 0–37)

## 2023-11-13 LAB — HEMOGLOBIN A1C: Hgb A1c MFr Bld: 6.1 % (ref 4.6–6.5)

## 2023-11-13 NOTE — Progress Notes (Unsigned)
Subjective:    Patient ID: Lori Joseph, female    DOB: November 18, 1941, 82 y.o.   MRN: [id number]  DOS:  11/13/2023 Type of visit - description: CPX  Here for CPX  Went to the ER 11/03/2023: Had a near syncope. Was in line waiting for a rotisserie chicken, she felt clammy and felt he was going to pass out. Presented to the ER when the symptoms were resolved. Potassium 3.5 creatinine normal blood sugar 108, chest x-ray normal, UA normal.  Was released home,  No further presyncope, specifically denies chest pain, palpitations, headache, slurred speech. At the time of the near syncope she was recovering from a cold and she thinks that is the reason. Still has some cough with whitish sputum.  Review of Systems See above   Past Medical History:  Diagnosis Date   Anal condyloma Warts    Complication of anesthesia    slow to awaken after colonscopy yrs ago   Eczema    hands/legs   Family history of anesthesia complication    daughter hard to wake up   H/O acute pancreatitis    yrs ago   H/O retinal detachment    History of Anemia    History of COVID-19 10/2021   congestion, diarrhea, flu like symptoms x 2 weeks al symptoms resolved   History of external beam radiation therapy    11-11-2015  to  12-30-2014   left breast 50.4 gray, lumpectomy cavity boosted to 62.4 gray   Hurthle cell neoplasm of thyroid    Right  [name] thryoid 10-26-2021 epic, biopsy done 12-20-2021   Hyperlipidemia    Hypertension    Hypothyroidism 05/31/2015   Osteopenia    PAF (paroxysmal atrial fibrillation) (HCC) 03/2017   pressure increased in both eyes not glaucoma    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   Seasonal allergies    Vitamin D deficiency    Wears partial dentures upper and lower     Past Surgical History:  Procedure Laterality Date   BREAST BIOPSY Left 08/27/2014   BREAST BIOPSY Left 11/27/2016    BREAST LUMPECTOMY Left 09/15/2014   CHOLECYSTECTOMY  2000   colonscopy  2018   DILATION AND CURETTAGE OF UTERUS     yrs ago   LASER ABLATION CONDOLAMATA N/A 12/22/2021   Procedure: LASER REMOVAL/ABLATION CONDOLAMATA;  Surgeon: Karie Soda, MD;  Location: [facility] [place];  Service: General;  Laterality: N/A;   laser eye surgery, detached retina Left    yrs ago   RADIOACTIVE SEED GUIDED PARTIAL MASTECTOMY WITH AXILLARY SENTINEL LYMPH NODE BIOPSY Left 09/16/2014   Procedure: RADIOACTIVE SEED GUIDED PARTIAL MASTECTOMY WITH AXILLARY SENTINEL LYMPH NODE BIOPSY;  Surgeon: Almond Lint, MD;  Location: [facility];  Service: General;  Laterality: Left;   RECTAL EXAM UNDER ANESTHESIA N/A 12/22/2021   Procedure: ANORECTAL EXAM UNDER ANESTHESIA excision of perirectal mass  Hemorrhoidal ligation and removal;  Surgeon: Karie Soda, MD;  Location: [facility] [place];  Service: General;  Laterality: N/A;   THYROID LOBECTOMY N/A 02/10/2015   Procedure: RIGHT THYROID LOBECTOMY;  Surgeon: Almond Lint, MD;  Location: [facility];  Service: General;  Laterality: N/A;   TONSILLECTOMY     age 33 adenoids remved also   TUBAL LIGATION     yrs ago    Current Outpatient Medications  Medication Instructions   albuterol (VENTOLIN  HFA) 108 (90 Base) MCG/ACT inhaler 1-2 puffs, Inhalation, Every 6 hours PRN   benzonatate (TESSALON) 100 mg, Oral, Every 8 hours PRN   calcium carbonate 2,500 mg, Every morning   cholecalciferol (VITAMIN D) 2,500 Units, Every morning   diazepam (VALIUM) 2.5-5 mg, Oral, Daily PRN   dorzolamide-timolol (COSOPT) 22.3-6.8 MG/ML ophthalmic solution 1 drop, Every morning   hydrochlorothiazide (HYDRODIURIL) 12.5 mg, Oral, Daily   latanoprost (XALATAN) 0.005 % ophthalmic solution PLACE 1 DROP INTO BOTH EYES NIGHTLY.   levothyroxine (SYNTHROID) 25 mcg, Oral, Daily before breakfast   Melatonin 10 MG TABS Daily at bedtime   metoprolol succinate (TOPROL-XL) 50 mg,  Oral, Daily, Take with or immediately following a meal   Multiple Vitamin (MULTIVITAMIN) tablet 1 tablet, Every morning   pravastatin (PRAVACHOL) 40 mg, Oral, Daily   rivaroxaban (XARELTO) 20 mg, Oral, Daily with supper       Objective:   Physical Exam BP 138/80   Pulse 81   Temp (!) 97.4 F (36.3 C) (Oral)   Resp 18   Ht 5\' 3"  (1.6 m)   Wt 192 lb (87.1 kg)   LMP  (LMP Unknown)   SpO2 96%   BMI 34.01 kg/m  General: Well developed, NAD, BMI noted Neck: No lymphadenopathies. HEENT:  Normocephalic . Face symmetric, atraumatic Lungs:  CTA B Normal respiratory effort, no intercostal retractions, no accessory muscle use. Heart: RRR,  no murmur.  Abdomen:  Not distended, soft, non-tender. No rebound or rigidity.   Lower extremities: no pretibial edema bilaterally  Skin: Exposed areas without rash. Not pale. Not jaundice Neurologic:  alert & oriented X3.  Speech normal, gait appropriate for age and unassisted Strength symmetric and appropriate for age.  Psych: Cognition and judgment appear intact.  Cooperative with normal attention span and concentration.  Behavior appropriate. No anxious or depressed appearing.     Assessment   Assessment  Hyperglycemia (a1c= 6.0 11.2019) HTN Hyperlipidemia Hypothyroidism Osteopenia: T score 2010 normal,  T score 05-2012 (-1.2), T score 03/2018 -1.1; Tscore -1.1 (2022) on calcium and vitamin D Morbid Obesity Insomnia: INTOLERANT to zolpidem CV: -A. fib, new onset 03-2017 -Low risk stress test 04-2017 Oncology: --Breast cancer, left, dx  2015, s/p XRT 2016, on Arimidex x 5 years; Oncology release her --Hurthle Cell neoplasm of the thyroid, R thyroidectomy lobe  01-2015 SKIN: ---Eczema ---[facility] 2012  ANEMIA- saw hematology, rx IV iron 10-2015 Chronic right-sided chest pain:  x-rays CT abdomen and pelvis 2016 negative, saw pain mngmt, ortho ---> better with Lidoderm patch and a local injection in the back Glaucoma   H/o acute  pancreatitis H/o retinal  detachment Pulmonary: Abnormal chest x-ray and CT chest: Last CT chest 01-2016: See report. CXR 03/27/2016: No acute. Pulmonary: f/u prn ++ FH Lung Ca (pt is not a smoker)  PLAN:  T d 2018  - PNM 23 2008, 2018 ; Prevnar 2015.  PNM 20: 2023 - zostavax 2012,  shingrix x2  - s/p  RSV     - had a Flu shot   - covid vax rec   -Female care:  sees gyn  H/o breast ca, last MMG  04-2023 ([facility]) -CCS: Cscope @ [facility] 01-2008 : 2 polyps ----> tubular adenomas w/  low grade dysplasia Cscope 08/2010, Dr Marina Goodell, very small polyps Last Cscope 08-2016: Adenomatous polyps, no follow-up due to age per GI letter  --Labs:  AST ALT FLP A1c TSH -Diet and exercise discussed -Advance care planning : documents on file   Near  syncope: Single episode in the context of recovering from a URI, we agreed on observation for now. HTN: BP is very good, continue HCTZ, metoprolol, recent BMP okay. PAF: Saw cardiology 05-2023, on Xarelto, metoprolol, no changes made. High cholesterol: On Pravachol, check FLP AST ALT Hypothyroidism: On Synthroid, check PSA. History of condyloma: Saw surgery 08/21/2023, exam was benign.  No routine follow-up recommended Osteopenia: T-score -1.1 2022 Cough: Recovering from a URI, still coughing, exam is benign, recommend Robitussin DM, call if not gradually better. RTC 6 months   HTN: BP is very good, when checked at home normal, continue HCTZ, metoprolol, check a BMP Hypothyroidism: On Synthroid, check TSH. Hurthle cell neoplasm, R thyroidectomy: Last thyroid ultrasound satisfactory, no further routine ultrasounds. PAF: Anticoagulated, denies any unusual bleeding seen. Preventive care: RTC 5 to 6 months

## 2023-11-13 NOTE — Patient Instructions (Addendum)
 Vaccines I recommend: Covid booster   Check the  blood pressure regularly Blood pressure goal:  between 110/65 and  135/85. If it is consistently higher or lower, let me know     GO TO THE LAB : Get the blood work     Next visit with me in 6 months Please schedule it at the front desk

## 2023-11-14 ENCOUNTER — Encounter: Payer: Self-pay | Admitting: Internal Medicine

## 2023-11-14 DIAGNOSIS — H52203 Unspecified astigmatism, bilateral: Secondary | ICD-10-CM | POA: Diagnosis not present

## 2023-11-14 DIAGNOSIS — H25813 Combined forms of age-related cataract, bilateral: Secondary | ICD-10-CM | POA: Diagnosis not present

## 2023-11-14 DIAGNOSIS — H401131 Primary open-angle glaucoma, bilateral, mild stage: Secondary | ICD-10-CM | POA: Diagnosis not present

## 2023-11-14 DIAGNOSIS — H524 Presbyopia: Secondary | ICD-10-CM | POA: Diagnosis not present

## 2023-11-14 NOTE — Assessment & Plan Note (Signed)
Here for CPX Near syncope: Single episode in the context of recovering from a URI, ER notes reviewed,we agreed on observation for now. HTN: BP is very good, continue HCTZ, metoprolol, recent BMP okay. PAF: Saw cardiology 05-2023, on Xarelto, metoprolol, no changes made. High cholesterol: On Pravachol, check FLP AST ALT Hypothyroidism: On Synthroid, check PSA. History of condyloma: Saw surgery 08/21/2023, exam was benign.  No routine follow-up recommended Osteopenia: T-score -1.1 2022 Cough: Recovering from a URI, still coughing, exam is benign, recommend Robitussin DM, call if not gradually better. RTC 6 months

## 2023-11-14 NOTE — Assessment & Plan Note (Signed)
Here for CPX - T d 2018  - PNM 23 2008, 2018 ; Prevnar 2015.  PNM 20: 2023;  zostavax 2012,  shingrix x2 ;  s/p  RSV     - had a Flu shot   - covid vax rec   -Female care:  sees gyn  H/o breast ca, last MMG  04-2023 ([facility]) -CCS: Cscope @ [facility] 01-2008 : 2 polyps ----> tubular adenomas w/  low grade dysplasia Cscope 08/2010, Dr Marina Goodell, very small polyps Last Cscope 08-2016: Adenomatous polyps, no follow-up due to age per GI letter  --Labs:  AST ALT FLP A1c TSH -Diet and exercise discussed -Advance care planning : documents on file

## 2023-11-18 MED ORDER — LEVOTHYROXINE SODIUM 50 MCG PO TABS: 50.0000 ug | ORAL_TABLET | Freq: Every day | ORAL | 0 refills | Status: DC

## 2023-11-18 NOTE — Addendum Note (Signed)
Addended byConrad Ina D on: 11/18/2023 08:17 AM   Modules accepted: Orders

## 2023-12-21 ENCOUNTER — Other Ambulatory Visit: Payer: Self-pay | Admitting: Internal Medicine

## 2024-01-02 DIAGNOSIS — H409 Unspecified glaucoma: Secondary | ICD-10-CM | POA: Insufficient documentation

## 2024-01-03 ENCOUNTER — Ambulatory Visit: Payer: Medicare Other | Admitting: Internal Medicine

## 2024-01-03 ENCOUNTER — Other Ambulatory Visit (INDEPENDENT_AMBULATORY_CARE_PROVIDER_SITE_OTHER): Payer: Medicare Other

## 2024-01-03 DIAGNOSIS — E038 Other specified hypothyroidism: Secondary | ICD-10-CM

## 2024-01-03 LAB — TSH: TSH: 2.51 u[IU]/mL (ref 0.35–5.50)

## 2024-01-05 ENCOUNTER — Encounter: Payer: Self-pay | Admitting: Internal Medicine

## 2024-01-19 ENCOUNTER — Other Ambulatory Visit: Payer: Self-pay | Admitting: Internal Medicine

## 2024-01-20 ENCOUNTER — Ambulatory Visit (INDEPENDENT_AMBULATORY_CARE_PROVIDER_SITE_OTHER): Payer: Medicare Other | Admitting: Internal Medicine

## 2024-01-20 ENCOUNTER — Other Ambulatory Visit: Payer: Self-pay | Admitting: Internal Medicine

## 2024-01-20 VITALS — BP 133/86 | HR 101 | Temp 98.6°F | Resp 16 | Ht 63.0 in | Wt 189.0 lb

## 2024-01-20 DIAGNOSIS — B349 Viral infection, unspecified: Secondary | ICD-10-CM | POA: Diagnosis not present

## 2024-01-20 DIAGNOSIS — R0981 Nasal congestion: Secondary | ICD-10-CM | POA: Diagnosis not present

## 2024-01-20 LAB — POC INFLUENZA A&B (BINAX/QUICKVUE)

## 2024-01-20 LAB — POC COVID19 BINAXNOW: SARS Coronavirus 2 Ag: NEGATIVE

## 2024-01-20 MED ORDER — AZITHROMYCIN 250 MG PO TABS: ORAL_TABLET | ORAL | 0 refills | Status: DC

## 2024-01-20 NOTE — Assessment & Plan Note (Signed)
 Viral syndrome Flu and COVID test negative. Plan: Conservative treatment, check for COVID one more time tomorrow at home, let me know if +.  Antibiotics if not better in few days, see instructions Oral membrane lesion: see physical exam, it was noted few days ago, recommend to see her dentist,

## 2024-01-20 NOTE — Progress Notes (Signed)
 Subjective:    Patient ID: Lori Joseph, female    DOB: [birth year], 83 y.o.   MRN: [id number]  DOS:  01/20/2024 Type of visit - description: Acute  Symptoms started 3 days ago: Had a low-grade temperature around 100 degrees the first 48 hours. Having bilateral sinus/facial congestion, coughing green mucus, she thinks mucus is from the sinuses and postnasal drip b/c she has no rhonchi or chest congestion.  Denies nausea vomiting. When she gets up she feels weak but no syncope. Had some myalgias at the first 2 days, that is better. Taking Mucinex.   Review of Systems See above   Past Medical History:  Diagnosis Date   Anal condyloma Warts    Complication of anesthesia    slow to awaken after colonscopy yrs ago   Eczema    hands/legs   Family history of anesthesia complication    daughter hard to wake up   Glaucoma    H/O acute pancreatitis    yrs ago   H/O retinal detachment    History of Anemia    History of COVID-19 10/2021   congestion, diarrhea, flu like symptoms x 2 weeks al symptoms resolved   History of external beam radiation therapy    11-11-2015  to  12-30-2014   left breast 50.4 gray, lumpectomy cavity boosted to 62.4 gray   Hurthle cell neoplasm of thyroid    Right  [name] thryoid 10-26-2021 epic, biopsy done 12-20-2021   Hyperlipidemia    Hypertension    Hypothyroidism 05/31/2015   Osteopenia    PAF (paroxysmal atrial fibrillation) (HCC) 03/2017   pressure increased in both eyes not glaucoma    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   Seasonal allergies    Vitamin D deficiency    Wears partial dentures upper and lower     Past Surgical History:  Procedure Laterality Date   BREAST BIOPSY Left 08/27/2014   BREAST BIOPSY Left 11/27/2016   BREAST LUMPECTOMY Left 09/15/2014   CHOLECYSTECTOMY  2000   colonscopy  2018   DILATION AND CURETTAGE OF UTERUS     yrs ago    LASER ABLATION CONDOLAMATA N/A 12/22/2021   Procedure: LASER REMOVAL/ABLATION CONDOLAMATA;  Surgeon: Karie Soda, MD;  Location: [facility];  Service: General;  Laterality: N/A;   laser eye surgery, detached retina Left    yrs ago   RADIOACTIVE SEED GUIDED PARTIAL MASTECTOMY WITH AXILLARY SENTINEL LYMPH NODE BIOPSY Left 09/16/2014   Procedure: RADIOACTIVE SEED GUIDED PARTIAL MASTECTOMY WITH AXILLARY SENTINEL LYMPH NODE BIOPSY;  Surgeon: Almond Lint, MD;  Location: [facility];  Service: General;  Laterality: Left;   RECTAL EXAM UNDER ANESTHESIA N/A 12/22/2021   Procedure: ANORECTAL EXAM UNDER ANESTHESIA excision of perirectal mass  Hemorrhoidal ligation and removal;  Surgeon: Karie Soda, MD;  Location: [facility];  Service: General;  Laterality: N/A;   THYROID LOBECTOMY N/A 02/10/2015   Procedure: RIGHT THYROID LOBECTOMY;  Surgeon: Almond Lint, MD;  Location: [facility];  Service: General;  Laterality: N/A;   TONSILLECTOMY     age 70 adenoids remved also   TUBAL LIGATION     yrs ago    Current Outpatient Medications  Medication Instructions   albuterol (VENTOLIN HFA) 108 (90 Base) MCG/ACT inhaler 1-2 puffs, Inhalation, Every 6 hours PRN   calcium carbonate 2,500 mg, Every morning  cholecalciferol (VITAMIN D) 2,500 Units, Every morning   diazepam (VALIUM) 2.5-5 mg, Oral, Daily PRN   dorzolamide-timolol (COSOPT) 22.3-6.8 MG/ML ophthalmic solution 1 drop, Every morning   hydrochlorothiazide (HYDRODIURIL) 12.5 mg, Oral, Daily   latanoprost (XALATAN) 0.005 % ophthalmic solution PLACE 1 DROP INTO BOTH EYES NIGHTLY.   levothyroxine (SYNTHROID) 50 mcg, Oral, Daily before breakfast   Melatonin 10 MG TABS Daily at bedtime   metoprolol succinate (TOPROL-XL) 50 mg, Oral, Daily, TAKE WITH OR IMMEDIATELY FOLLOWING A MEAL.   Multiple Vitamin (MULTIVITAMIN) tablet 1 tablet, Every morning   pravastatin (PRAVACHOL) 40 mg, Oral, Daily   rivaroxaban (XARELTO)  20 mg, Oral, Daily with supper       Objective:   Physical Exam BP 133/86 (BP Location: Right Arm, Patient Position: Sitting, Cuff Size: Large)   Pulse (!) 101   Temp 98.6 F (37 C) (Oral)   Resp 16   Ht 5\' 3"  (1.6 m)   Wt 189 lb (85.7 kg)   LMP  (LMP Unknown)   SpO2 100%   BMI 33.48 kg/m  General:   Well developed, NAD, BMI noted. HEENT:  Normocephalic . Face symmetric, atraumatic TMs: Not red. Nose, slightly congested. Throat: Symmetric, no red. Oral membranes, she points at the inner lower left lip and she has a 2-3 mm soft lump at the membrane, no ulcer, no hyperpigmentation (recommend to see her dentist) Lungs:  CTA B Normal respiratory effort, no intercostal retractions, no accessory muscle use. Heart: irre irregular.  Lower extremities: no pretibial edema bilaterally  Skin: Not pale. Not jaundice Neurologic:  alert & oriented X3.  Speech normal, gait appropriate for age and unassisted Psych--  Cognition and judgment appear intact.  Cooperative with normal attention span and concentration.  Behavior appropriate. No anxious or depressed appearing.      Assessment   Assessment  Hyperglycemia (a1c= 6.0 11.2019) HTN Hyperlipidemia Hypothyroidism Osteopenia: T score 2010 normal,  T score 05-2012 (-1.2), T score 03/2018 -1.1; Tscore -1.1 (2022) on calcium and vitamin D Morbid Obesity Insomnia: INTOLERANT to zolpidem CV: -A. fib, new onset 03-2017 -Low risk stress test 04-2017 Oncology: --Breast cancer, left, dx  2015, s/p XRT 2016, on Arimidex x 5 years; Oncology release her --Hurthle Cell neoplasm of the thyroid, R thyroidectomy lobe  01-2015 SKIN: ---Eczema ---[facility] 2012  ANEMIA- saw hematology, rx IV iron 10-2015 Chronic right-sided chest pain:  x-rays CT abdomen and pelvis 2016 negative, saw pain mngmt, ortho ---> better with Lidoderm patch and a local injection in the back Glaucoma   H/o acute pancreatitis H/o retinal  detachment Pulmonary: Abnormal  chest x-ray and CT chest: Last CT chest 01-2016: See report. CXR 03/27/2016: No acute. Pulmonary: f/u prn ++ FH Lung Ca (pt is not a smoker)  PLAN: Viral syndrome Flu and COVID test negative. Plan: Conservative treatment, check for COVID one more time tomorrow at home, let me know if +.  Antibiotics if not better in few days, see instructions Oral membrane lesion: see physical exam, it was noted few days ago, recommend to see her dentist,

## 2024-01-20 NOTE — Patient Instructions (Signed)
  Check yourself for COVID at home tomorrow, if the result is positive let me know.  Rest, fluids , tylenol  For cough:  Continue taking Mucinex DM or Robitussin-DM OTC.  Follow the instructions in the box.   For nasal congestion:  -Use OTC Astepro 2 nasal sprays on each side of the nose twice daily until better  Avoid decongestants such as  Pseudoephedrine or phenylephrine    Start antibiotic if you are not gradually better in the next 3 to 4 days  Call if not gradually better over the next  10 days   Call anytime if the symptoms are severe, you have high fever, short of breath, chest pain

## 2024-02-04 ENCOUNTER — Telehealth: Payer: Self-pay | Admitting: Cardiovascular Disease

## 2024-02-04 NOTE — Telephone Encounter (Signed)
 Spoke with patient and shared Dr. Fabio Bering response regarding Xarelto:  Yes hold xarelto today tomorrow and can ask podiatrist what to do after tomorrow depending on what he does    Patient verbalized understanding and expressed appreciation for follow-up.

## 2024-02-04 NOTE — Telephone Encounter (Signed)
 Patient states she stepped on broken glass in her home this past Saturday. She reports there was "quite a lot of bleeding" that has since stopped. No active bleeding at this time. Patient reports she and her husband were unable to remove the glass from her foot.  Patient is scheduled to have glass removed from foot at podiatrist office tomorrow. Patient is asking if she needs to hold her Xarelto.   Will forward to Dr. Eden Emms and his nurse to address holding Xarelto for foreign object removal scheduled for tomorrow.

## 2024-02-04 NOTE — Telephone Encounter (Signed)
 Pt c/o medication issue:  1. Name of Medication: rivaroxaban (XARELTO) 20 MG TABS tablet   2. How are you currently taking this medication (dosage and times per day)? Take 1 tablet (20 mg total) by mouth daily with supper   3. Are you having a reaction (difficulty breathing--STAT)? No  4. What is your medication issue? Pt states that she has a piece of glass stuck in her toe that has to be taken out. Pt would like to know if she is able to stop above medication if need be in order for glass to be taken out. Please advise

## 2024-02-05 ENCOUNTER — Ambulatory Visit: Admitting: Podiatry

## 2024-02-05 ENCOUNTER — Encounter: Payer: Self-pay | Admitting: Podiatry

## 2024-02-05 ENCOUNTER — Ambulatory Visit (INDEPENDENT_AMBULATORY_CARE_PROVIDER_SITE_OTHER)

## 2024-02-05 DIAGNOSIS — L03031 Cellulitis of right toe: Secondary | ICD-10-CM | POA: Diagnosis not present

## 2024-02-05 DIAGNOSIS — S90851A Superficial foreign body, right foot, initial encounter: Secondary | ICD-10-CM

## 2024-02-05 NOTE — Progress Notes (Signed)
  Subjective:  Patient ID: Lori Joseph, female    DOB: 1941/10/21,   MRN: [id number]  No chief complaint on file.   83 y.o. female presents for concern of stepping on a piece of glass this past Saturday. Patient relates stepped on a piece of glass from a shattered picture frame. She relates she was bleeding a lot and tired to get it out but has been very painful and believes some is still in there.  She is on blood thinners. . She stopped her eliquis last night. Denies any other pedal complaints. Denies n/v/f/c.   Past Medical History:  Diagnosis Date   Anal condyloma Warts    Complication of anesthesia    slow to awaken after colonscopy yrs ago   Eczema    hands/legs   Family history of anesthesia complication    daughter hard to wake up   Glaucoma    H/O acute pancreatitis    yrs ago   H/O retinal detachment    History of Anemia    History of COVID-19 10/2021   congestion, diarrhea, flu like symptoms x 2 weeks al symptoms resolved   History of external beam radiation therapy    11-11-2015  to  12-30-2014   left breast 50.4 gray, lumpectomy cavity boosted to 62.4 gray   Hurthle cell neoplasm of thyroid    Right  [name] thryoid 10-26-2021 epic, biopsy done 12-20-2021   Hyperlipidemia    Hypertension    Hypothyroidism 05/31/2015   Osteopenia    PAF (paroxysmal atrial fibrillation) (HCC) 03/2017   pressure increased in both eyes not glaucoma    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 CFT <3 seconds. Normal hair growth on digits. No edema.  Skin. No lacerations or abrasions bilateral feet. Plantar right hallux foreing body noted upon derbidement tiny piece of glass noted. Small abrsion noted to plantar right hallux  Musculoskeletal: MMT  5/5 bilateral lower extremities in DF, PF, Inversion and Eversion. Deceased ROM in DF of ankle joint.  Neurological: Sensation intact to light touch.   Assessment:   1. Foreign body in right foot, initial encounter      Plan:  Patient was evaluated and treated and all questions answered. X-rays reviewed and discussed with patient. No obvious foreing body noted.  Discussed foreign bodies and treatment options with the patient. Patient elected to go awith removal  today.  Procedure note below. Advised patient on postprocedure protocol. Patient to follow-up as needed   Procedure: Excisional Debridement of foreign body  Rationale: Removal of foregin body  Anesthesia: none Type of Debridement: Sharp Excisional Tissue Removed: Non-viable soft tissue and small piece of glas < 0.1 cm  Depth of Debridement: subcutaneous tissue. Technique: Sharp excisional debridement to bleeding skin  Dressing: Dry, sterile, compression dressing. Disposition: Patient tolerated procedure well. Advised on post-op care.   No follow-ups on file.    Louann Sjogren, DPM

## 2024-02-15 ENCOUNTER — Other Ambulatory Visit: Payer: Self-pay | Admitting: Internal Medicine

## 2024-04-10 ENCOUNTER — Other Ambulatory Visit: Payer: Self-pay | Admitting: Internal Medicine

## 2024-04-16 ENCOUNTER — Other Ambulatory Visit: Payer: Self-pay | Admitting: Internal Medicine

## 2024-04-29 ENCOUNTER — Other Ambulatory Visit: Payer: Self-pay | Admitting: Internal Medicine

## 2024-04-29 DIAGNOSIS — Z0000 Encounter for general adult medical examination without abnormal findings: Secondary | ICD-10-CM

## 2024-05-06 DIAGNOSIS — H401131 Primary open-angle glaucoma, bilateral, mild stage: Secondary | ICD-10-CM | POA: Diagnosis not present

## 2024-05-06 DIAGNOSIS — H25813 Combined forms of age-related cataract, bilateral: Secondary | ICD-10-CM | POA: Diagnosis not present

## 2024-05-11 ENCOUNTER — Telehealth: Payer: Self-pay

## 2024-05-11 ENCOUNTER — Other Ambulatory Visit

## 2024-05-11 DIAGNOSIS — R399 Unspecified symptoms and signs involving the genitourinary system: Secondary | ICD-10-CM

## 2024-05-11 NOTE — Telephone Encounter (Signed)
 Pt dropped off urine today, no orders in chart. States she has been having UTI sx's, states she called and someone told her to bring a urine, this is not noted in chart. Okay to place orders this time?

## 2024-05-11 NOTE — Telephone Encounter (Signed)
  Okay to process the sample as long as it was a sterile container.

## 2024-05-11 NOTE — Telephone Encounter (Signed)
 Orders placed.

## 2024-05-13 ENCOUNTER — Ambulatory Visit: Payer: Medicare Other | Admitting: Internal Medicine

## 2024-05-13 LAB — URINE CULTURE
MICRO NUMBER:: 16584515
SPECIMEN QUALITY:: ADEQUATE

## 2024-05-14 ENCOUNTER — Ambulatory Visit: Payer: Self-pay | Admitting: Internal Medicine

## 2024-05-14 MED ORDER — SULFAMETHOXAZOLE-TRIMETHOPRIM 800-160 MG PO TABS: 1.0000 | ORAL_TABLET | Freq: Two times a day (BID) | ORAL | 0 refills | Status: DC

## 2024-05-15 ENCOUNTER — Ambulatory Visit: Admitting: Internal Medicine

## 2024-05-22 ENCOUNTER — Ambulatory Visit
Admission: RE | Admit: 2024-05-22 | Discharge: 2024-05-22 | Disposition: A | Discharge status: Home / Self Care | Source: Ambulatory Visit | Attending: Internal Medicine | Admitting: Internal Medicine

## 2024-05-22 DIAGNOSIS — Z1231 Encounter for screening mammogram for malignant neoplasm of breast: Secondary | ICD-10-CM | POA: Diagnosis not present

## 2024-05-22 DIAGNOSIS — Z0000 Encounter for general adult medical examination without abnormal findings: Secondary | ICD-10-CM

## 2024-06-06 ENCOUNTER — Other Ambulatory Visit: Payer: Self-pay | Admitting: Internal Medicine

## 2024-06-11 ENCOUNTER — Ambulatory Visit
Admission: EM | Admit: 2024-06-11 | Discharge: 2024-06-11 | Disposition: A | Discharge status: Home / Self Care | Attending: Physician Assistant | Admitting: Physician Assistant

## 2024-06-11 ENCOUNTER — Ambulatory Visit: Admitting: Family

## 2024-06-11 ENCOUNTER — Other Ambulatory Visit: Payer: Self-pay

## 2024-06-11 ENCOUNTER — Encounter: Payer: Self-pay | Admitting: *Deleted

## 2024-06-11 ENCOUNTER — Ambulatory Visit: Payer: Self-pay

## 2024-06-11 DIAGNOSIS — R300 Dysuria: Secondary | ICD-10-CM | POA: Diagnosis not present

## 2024-06-11 LAB — POCT URINALYSIS DIP (MANUAL ENTRY)
Bilirubin, UA: NEGATIVE
Blood, UA: NEGATIVE
Glucose, UA: NEGATIVE mg/dL
Ketones, POC UA: NEGATIVE mg/dL
Nitrite, UA: NEGATIVE
Protein Ur, POC: NEGATIVE mg/dL
Spec Grav, UA: 1.02 (ref 1.010–1.025)
Urobilinogen, UA: 0.2 U/dL
pH, UA: 6.5 (ref 5.0–8.0)

## 2024-06-11 MED ORDER — CIPROFLOXACIN HCL 500 MG PO TABS: 500.0000 mg | ORAL_TABLET | Freq: Two times a day (BID) | ORAL | 0 refills | Status: DC

## 2024-06-11 NOTE — Telephone Encounter (Signed)
 FYI Only or Action Required?: Action required by provider: clinical question for provider.  Patient was last seen in primary care on 01/20/2024 by Amon Aloysius BRAVO, MD.  Called Nurse Triage reporting Dysuria.  Symptoms began a week ago.  Interventions attempted: Rest, hydration, or home remedies.  Symptoms are: unchanged.  Triage Disposition: See Physician Within 24 Hours-patient needing a phone call back as she is asking to bring a urine sample into the clinic.   Patient/caregiver understands and will follow disposition?: Yes  Copied from CRM [id number]. Topic: Clinical - Red Word Triage >> Jun 11, 2024  8:36 AM Revonda D wrote: Red Word that prompted transfer to Nurse Triage: Pain when urinating, diarrhea   Pt stated that she thinks she has another bladder infection and would like to send a message to provider asking if she can bring in an urine sample. Pt stated that she has pain when she urinates and also has had diarrhea. Reason for Disposition  Urinating more frequently than usual (i.e., frequency) OR new-onset of the feeling of an urgent need to urinate (i.e., urgency)  Answer Assessment - Initial Assessment Questions 1. SYMPTOM: What's the main symptom you're concerned about? (e.g., frequency, incontinence)     Pain with urination 2. ONSET: When did the  pain with urination  start?     Last week 3. PAIN: Is there any pain? If Yes, ask: How bad is it? (Scale: 1-10; mild, moderate, severe)     mild 4. CAUSE: What do you think is causing the symptoms?     Possible UTI 5. OTHER SYMPTOMS: Do you have any other symptoms? (e.g., blood in urine, fever, flank pain, pain with urination)     One episode of diarrhea  Patient calling to see if she can bring a sample into the clinic-appointment not scheduled due to patient's request. Patient will need a phone call to follow up.  Protocols used: Urinary Symptoms-A-AH

## 2024-06-11 NOTE — Telephone Encounter (Signed)
 Appt scheduled w/ provider today

## 2024-06-11 NOTE — ED Provider Notes (Signed)
 [facility]    CSN: [id number] Arrival date & time: 06/11/24  1042      History   Chief Complaint Chief Complaint  Patient presents with   Dysuria    HPI Lori Joseph is a 83 y.o. female.   Patient presents today for evaluation of dysuria she developed after she had diarrhea while wearing a pad to bed.  She reports that she has not any other symptoms.  She just wanted to be sure she did not have a UTI.  The history is provided by the patient.  Dysuria Associated symptoms: no abdominal pain, no fever, no nausea and no vomiting     Past Medical History:  Diagnosis Date   Anal condyloma Warts    Complication of anesthesia    slow to awaken after colonscopy yrs ago   Eczema    hands/legs   Family history of anesthesia complication    daughter hard to wake up   Glaucoma    H/O acute pancreatitis    yrs ago   H/O retinal detachment    History of Anemia    History of COVID-19 10/2021   congestion, diarrhea, flu like symptoms x 2 weeks al symptoms resolved   History of external beam radiation therapy    11-11-2015  to  12-30-2014   left breast 50.4 gray, lumpectomy cavity boosted to 62.4 gray   Hurthle cell neoplasm of thyroid     Right  us  thryoid 10-26-2021 epic, biopsy done 12-20-2021   Hyperlipidemia    Hypertension    Hypothyroidism 05/31/2015   Osteopenia    PAF (paroxysmal atrial fibrillation) (HCC) 03/2017   pressure increased in both eyes not glaucoma    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   Seasonal allergies    Vitamin D  deficiency    Wears partial dentures upper and lower     Patient Active Problem List   Diagnosis Date Noted   Glaucoma 01/02/2024   Elevated LFTs 09/14/2021   Vitamin D  deficiency 09/14/2021   Prediabetes 09/14/2021   Atrial fibrillation (HCC).Onset 03/2017 04/26/2017   Pulmonary infiltrates on CXR 01/30/2016   Upper airway  cough syndrome 01/30/2016   PCP NOTES >>>>>>>>>>>>>>>> 08/25/2015   Hypothyroidism 05/31/2015   Family history of malignant neoplasm of breast 03/11/2015   Family history of malignant neoplasm of ovary 03/11/2015   PCP comments--R Chest wall and flank pain 12/31/2014   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 , 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   Benign neoplasm of thyroid  12/31/2014   Chest pain 12/31/2014   Breast cancer of upper-outer quadrant of left female breast (HCC) 09/08/2014   Eczema 08/05/2014   Annual physical exam >>>>>>>>>>>>>>>>>>>>>>>> 06/18/2012   BCC (basal cell carcinoma of skin) 06/18/2012   OTHER&UNSPECIFIED DISEASES THE ORAL SOFT TISSUES 02/10/2011   HYPERLIPIDEMIA 01/12/2011   OSTEOPENIA 09/28/2009   COLONIC POLYPS, 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   Morbid obesity (HCC) 05/18/2009   Essential hypertension 10/15/2007   PANCREATITIS, HX OF 03/13/2007    Past Surgical History:  Procedure Laterality Date   BREAST BIOPSY Left 08/27/2014   BREAST BIOPSY Left 11/27/2016   BREAST LUMPECTOMY Left 09/15/2014   CHOLECYSTECTOMY  2000   colonscopy  2018   DILATION AND CURETTAGE OF UTERUS     yrs ago   LASER ABLATION CONDOLAMATA N/A 12/22/2021   Procedure: LASER REMOVAL/ABLATION CONDOLAMATA;  Surgeon: Sheldon Standing, MD;  Location: [facility];  Service: General;  Laterality: N/A;   laser eye surgery, detached retina Left    yrs ago   RADIOACTIVE SEED GUIDED PARTIAL MASTECTOMY WITH AXILLARY SENTINEL LYMPH NODE BIOPSY Left 09/16/2014   Procedure: RADIOACTIVE SEED GUIDED PARTIAL MASTECTOMY WITH AXILLARY SENTINEL LYMPH NODE BIOPSY;  Surgeon: Jina Nephew, MD;  Location: [facility];  Service: General;  Laterality: Left;   RECTAL EXAM UNDER ANESTHESIA N/A 12/22/2021   Procedure: ANORECTAL EXAM UNDER ANESTHESIA excision of perirectal mass  Hemorrhoidal ligation and removal;  Surgeon: Sheldon Standing, MD;  Location: [facility] [place];  Service: General;  Laterality:  N/A;   THYROID  LOBECTOMY N/A 02/10/2015   Procedure: RIGHT THYROID  LOBECTOMY;  Surgeon: Jina Nephew, MD;  Location: [facility];  Service: General;  Laterality: N/A;   TONSILLECTOMY     age 45 adenoids remved also   TUBAL LIGATION     yrs ago    OB History     Gravida  2   Para  2   Term      Preterm      AB      Living         SAB      IAB      Ectopic      Multiple      Live Births               Home Medications    Prior to Admission medications   Medication Sig Start Date End Date Taking? Authorizing Provider  ciprofloxacin  (CIPRO ) 500 MG tablet Take 1 tablet (500 mg total) by mouth every 12 (twelve) hours. 06/11/24  Yes Billy Asberry FALCON, PA-C  XARELTO  20 MG TABS tablet TAKE 1 TABLET BY MOUTH DAILY WITH SUPPER. 04/13/24  Yes Paz, Jose E, MD  albuterol  (VENTOLIN  HFA) 108 [id number] Base) MCG/ACT inhaler Inhale 1-2 puffs into the lungs every 6 (six) hours as needed for wheezing or shortness of breath. 10/11/23   Hazen Darryle BRAVO, FNP  calcium  carbonate 1250 MG capsule Take 2,500 mg by mouth every morning.     [provider]  cholecalciferol  (VITAMIN D ) 1000 UNITS tablet Take 2,500 Units by mouth every morning.     [provider]  diazepam  (VALIUM ) 5 MG tablet Take 0.5-1 tablets (2.5-5 mg total) by mouth daily as needed for anxiety (flying). 06/13/23   Paz, Jose E, MD  dorzolamide -timolol  (COSOPT ) 22.3-6.8 MG/ML ophthalmic solution Place 1 drop into both eyes every morning.     [provider]  hydrochlorothiazide  (HYDRODIURIL ) 25 MG tablet TAKE 1/2 TABLET BY MOUTH EVERY DAY 04/13/24   Paz, Jose E, MD  latanoprost  (XALATAN ) 0.005 % ophthalmic solution PLACE 1 DROP INTO BOTH EYES NIGHTLY. 12/03/16   [provider]  levothyroxine  (SYNTHROID ) 50 MCG tablet Take 1 tablet (50 mcg total) by mouth daily before breakfast. 02/17/24   Paz, Jose E, MD  Melatonin 10 MG TABS Take by mouth at bedtime.    [provider]  metoprolol  succinate  (TOPROL -XL) 50 MG 24 hr tablet Take 1 tablet (50 mg total) by mouth daily. Take with or immediately following a meal 06/08/24   Paz, Jose E, MD  Multiple Vitamin (MULTIVITAMIN) tablet Take 1 tablet by mouth every morning.    [provider]  pravastatin  (PRAVACHOL ) 40 MG tablet TAKE 1 TABLET BY MOUTH EVERY DAY 01/20/24   Paz, Jose E, MD    Family History Family History  Problem Relation Age of Onset  Stroke Mother    Hyperlipidemia Mother    Hypertension Mother    Hypertension Father    Cancer Father        lung cancer ; smoker   Breast cancer Sister 41   Kidney cancer Sister 48   Breast cancer Sister 107       passed 40   Melanoma Sister 69   Cancer Brother        3 brothers with lung cancer, all smokers   Cancer Paternal Uncle        2 pat uncles with lung cancer, smokers   Ovarian cancer Other 29       niece with ovarian cancer (related through sister with breast cancer)   Colon cancer Neg Hx    CAD Neg Hx    Esophageal cancer Neg Hx    Stomach cancer Neg Hx    Rectal cancer Neg Hx     Social History Social History   Tobacco Use   Smoking status: Never   Smokeless tobacco: Never  Vaping Use   Vaping status: Never Used  Substance Use Topics   Alcohol use: No    Alcohol/week: 0.0 standard drinks of alcohol   Drug use: No     Allergies   Codeine, Collagen, Hyaluronic acid  [collagen-chond-hyaluronic acid], Carvedilol, Felodipine, Lisinopril, Losartan potassium, Zolpidem , and Radiaplexrx [pyridoxine-zinc picolinate]   Review of Systems Review of Systems  Constitutional:  Negative for chills and fever.  Eyes:  Negative for discharge and redness.  Respiratory:  Negative for shortness of breath.   Gastrointestinal:  Negative for abdominal pain, nausea and vomiting.  Genitourinary:  Positive for dysuria.     Physical Exam Triage Vital Signs ED Triage Vitals  Encounter Vitals Group     BP 06/11/24 1124 126/87     Girls Systolic BP Percentile --       Girls Diastolic BP Percentile --      Boys Systolic BP Percentile --      Boys Diastolic BP Percentile --      Pulse Rate 06/11/24 1124 83     Resp 06/11/24 1124 16     Temp 06/11/24 1124 98.3 F (36.8 C)     Temp Source 06/11/24 1124 Oral     SpO2 06/11/24 1124 94 %     Weight --      Height --      Head Circumference --      Peak Flow --      Pain Score 06/11/24 1122 3     Pain Loc --      Pain Education --      Exclude from Growth Chart --    No data found.  Updated Vital Signs BP 126/87 (BP Location: Right Arm)   Pulse 83   Temp 98.3 F (36.8 C) (Oral)   Resp 16   LMP  (LMP Unknown)   SpO2 94%   Visual Acuity Right Eye Distance:   Left Eye Distance:   Bilateral Distance:    Right Eye Near:   Left Eye Near:    Bilateral Near:     Physical Exam Vitals and nursing note reviewed.  Constitutional:      General: She is not in acute distress.    Appearance: Normal appearance. She is not ill-appearing.  HENT:     Head: Normocephalic and atraumatic.  Eyes:     Conjunctiva/sclera: Conjunctivae normal.  Cardiovascular:     Rate and Rhythm: Normal rate.  Pulmonary:  Effort: Pulmonary effort is normal. No respiratory distress.  Neurological:     Mental Status: She is alert.  Psychiatric:        Mood and Affect: Mood normal.        Behavior: Behavior normal.        Thought Content: Thought content normal.      UC Treatments / Results  Labs (all labs ordered are listed, but only abnormal results are displayed) Labs Reviewed  POCT URINALYSIS DIP (MANUAL ENTRY) - Abnormal; Notable for the following components:      Result Value   Leukocytes, UA Trace (*)    All other components within normal limits  URINE CULTURE    EKG   Radiology No results found.  Procedures Procedures (including critical care time)  Medications Ordered in UC Medications - No data to display  Initial Impression / Assessment and Plan / UC Course  I have reviewed the triage  vital signs and the nursing notes.  Pertinent labs & imaging results that were available during my care of the patient were reviewed by me and considered in my medical decision making (see chart for details).    Trace LE on UA.  Will treat with antibiotics to cover possible UTI and urine culture ordered.  Will await results from culture for further recommendation but encouraged follow-up if symptoms worsen or fail to improve.  Final Clinical Impressions(s) / UC Diagnoses   Final diagnoses:  Dysuria   Discharge Instructions   None    ED Prescriptions     Medication Sig Dispense Auth. Provider   ciprofloxacin  (CIPRO ) 500 MG tablet Take 1 tablet (500 mg total) by mouth every 12 (twelve) hours. 10 tablet Billy Asberry FALCON, PA-C      PDMP not reviewed this encounter.   Billy Asberry FALCON, PA-C 06/11/24 1529

## 2024-06-11 NOTE — Telephone Encounter (Signed)
 LMOM informing Pt she will need an appt to see provider first, this is her second UTI in less than 1 month

## 2024-06-11 NOTE — ED Triage Notes (Signed)
 Pt reports last week she had diarrhea while she wearing a pad to bed. Since then she has been having some dysuria

## 2024-06-13 LAB — URINE CULTURE: Culture: 30000 — AB

## 2024-06-15 ENCOUNTER — Ambulatory Visit (HOSPITAL_COMMUNITY): Payer: Self-pay

## 2024-07-20 NOTE — Progress Notes (Signed)
 Date:  07/30/2024   ID:  Lori Joseph, DOB 03/10/1941, MRN [id number]   Provider Location: Office  PCP:  Amon Aloysius BRAVO, MD  Cardiologist:  Maude Emmer, MD  Electrophysiologist:  None   Evaluation Performed:  Follow-Up Visit  Chief Complaint:  Atrial fib  History of Present Illness:    Lori Joseph is a 83 y.o. female first seen in June 2018 for new onset atrial fibrillation. She was started on metoprolol  and xaretlto  CHADVASC is 4 Converted spontaneously    June 2018  Echo ordered and reviewed EF 55-60% mild LAE trivial MR Myovue normal EF 56% no ischemia    Monitoring was done 03/31/19  and NSR with PACs no PAF or significant arrhythmia.    She seems to have maintained NSR had laser removal of condylomata anal mass 12/22/21 stopped xarelto  With no issues   Husband is pretty sedentary , obese and has knee issues so she has to do most of the running around   06/03/23  in afib does not notice and rates are fine Flew to [place]  to see grand daughter who is teaching there as part of her education    Past Medical History:  Diagnosis Date   Anal condyloma Warts    Complication of anesthesia    slow to awaken after colonscopy yrs ago   Eczema    hands/legs   Family history of anesthesia complication    daughter hard to wake up   Glaucoma    H/O acute pancreatitis    yrs ago   H/O retinal detachment    History of Anemia    History of COVID-19 10/2021   congestion, diarrhea, flu like symptoms x 2 weeks al symptoms resolved   History of external beam radiation therapy    11-11-2015  to  12-30-2014   left breast 50.4 gray, lumpectomy cavity boosted to 62.4 gray   Hurthle cell neoplasm of thyroid     Right  us  thryoid 10-26-2021 epic, biopsy done 12-20-2021   Hyperlipidemia    Hypertension    Hypothyroidism 05/31/2015   Osteopenia    PAF (paroxysmal atrial fibrillation) (HCC) 03/2017   pressure increased in both eyes not glaucoma    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   Seasonal allergies    Vitamin D  deficiency    Wears partial dentures upper and lower    Past Surgical History:  Procedure Laterality Date   BREAST BIOPSY Left 08/27/2014   BREAST BIOPSY Left 11/27/2016   BREAST LUMPECTOMY Left 09/15/2014   CHOLECYSTECTOMY  2000   colonscopy  2018   DILATION AND CURETTAGE OF UTERUS     yrs ago   LASER ABLATION CONDOLAMATA N/A 12/22/2021   Procedure: LASER REMOVAL/ABLATION CONDOLAMATA;  Surgeon: Sheldon Standing, MD;  Location: [facility];  Service: General;  Laterality: N/A;   laser eye surgery, detached retina Left    yrs ago   RADIOACTIVE SEED GUIDED PARTIAL MASTECTOMY WITH AXILLARY SENTINEL LYMPH NODE BIOPSY Left 09/16/2014   Procedure: RADIOACTIVE SEED GUIDED PARTIAL MASTECTOMY WITH AXILLARY SENTINEL LYMPH NODE BIOPSY;  Surgeon: Jina Nephew, MD;  Location: [facility];  Service: General;  Laterality: Left;   RECTAL EXAM UNDER ANESTHESIA N/A 12/22/2021   Procedure: ANORECTAL EXAM UNDER ANESTHESIA excision of perirectal mass  Hemorrhoidal ligation and removal;  Surgeon: Sheldon Standing, MD;  Location: [facility];  Service: General;  Laterality: N/A;   THYROID  LOBECTOMY N/A 02/10/2015   Procedure: RIGHT THYROID  LOBECTOMY;  Surgeon: Jina Nephew, MD;  Location: [facility];  Service: General;  Laterality: N/A;   TONSILLECTOMY     age 89 adenoids remved also   TUBAL LIGATION     yrs ago     Current Meds  Medication Sig   calcium  carbonate 1250 MG capsule Take 2,500 mg by mouth every morning.    cholecalciferol  (VITAMIN D ) 1000 UNITS tablet Take 2,500 Units by mouth every morning.    dorzolamide -timolol  (COSOPT ) 22.3-6.8 MG/ML ophthalmic solution Place 1 drop into both eyes every morning.    hydrochlorothiazide  (HYDRODIURIL ) 25 MG tablet TAKE 1/2 TABLET BY MOUTH EVERY DAY   latanoprost  (XALATAN ) 0.005 % ophthalmic solution  PLACE 1 DROP INTO BOTH EYES NIGHTLY.   levothyroxine  (SYNTHROID ) 50 MCG tablet Take 1 tablet (50 mcg total) by mouth daily before breakfast.   Melatonin 10 MG TABS Take by mouth at bedtime.   metoprolol  succinate (TOPROL -XL) 50 MG 24 hr tablet Take 1 tablet (50 mg total) by mouth daily. Take with or immediately following a meal   Multiple Vitamin (MULTIVITAMIN) tablet Take 1 tablet by mouth every morning.   pravastatin  (PRAVACHOL ) 40 MG tablet TAKE 1 TABLET BY MOUTH EVERY DAY   XARELTO  20 MG TABS tablet TAKE 1 TABLET BY MOUTH DAILY WITH SUPPER.     Allergies:   Codeine, Collagen, Hyaluronic acid  [collagen-chond-hyaluronic acid], Carvedilol, Felodipine, Lisinopril, Losartan potassium, Zolpidem , and Radiaplexrx [pyridoxine-zinc picolinate]   Social History   Tobacco Use   Smoking status: Never   Smokeless tobacco: Never  Vaping Use   Vaping status: Never Used  Substance Use Topics   Alcohol use: No    Alcohol/week: 0.0 standard drinks of alcohol   Drug use: No     Family Hx: The patient's family history includes Breast cancer (age of onset: 35) in her sister; Breast cancer (age of onset: 48) in her sister; Cancer in her brother, father, and paternal uncle; Hyperlipidemia in her mother; Hypertension in her father and mother; Kidney cancer (age of onset: 44) in her sister; Melanoma (age of onset: 57) in her sister; Ovarian cancer (age of onset: 80) in an other family member; Stroke in her mother. There is no history of Colon cancer, CAD, Esophageal cancer, Stomach cancer, or Rectal cancer.  ROS:   Please see the history of present illness.    General:no colds or fevers, no weight changes Skin:no rashes or ulcers HEENT:no blurred vision, no congestion CV:see HPI PUL:see HPI GI:no diarrhea constipation or melena, no indigestion GU:no hematuria, no dysuria MS:no joint pain, no claudication Neuro:no syncope, no lightheadedness Endo:no diabetes, + thyroid  disease  All other systems  reviewed and are negative.   Prior CV studies:   The following studies were reviewed today:  Cardiac event monitor 4/20  Notes recorded by Delford Maude BROCKS, MD on 03/31/2019 at 12:04 PM EDT NSR rate PAC;s no PAF no significant arrhythmia  Echo 05/08/17 Study Conclusions   - Left ventricle: The cavity size was normal. Wall thickness was   normal. Systolic function was normal. The estimated ejection   fraction was in the range of 55% to 60%. Wall motion was normal;   there were no regional wall motion abnormalities. There was no   evidence of elevated ventricular filling pressure by Doppler   parameters. - Aortic valve: Trileaflet; mildly thickened, mildly calcified   leaflets. - Mitral valve:  Calcified annulus. There was trivial regurgitation. - Left atrium: The atrium was mildly dilated. - Tricuspid valve: There was mild regurgitation.    Labs/Other Tests and Data Reviewed:    EKG:  07/30/2024 afib rate 86 no acute changes    Recent Labs: 07/28/2024: ALT 11; BUN 14; Creatinine, Ser 0.75; Hemoglobin 12.1; Magnesium  1.8; Platelets 192.0; Potassium 3.8; Sodium 142; TSH 1.90   Recent Lipid Panel Lab Results  Component Value Date/Time   CHOL 177 07/28/2024 11:25 AM   CHOL 185 09/14/2021 09:19 AM   TRIG 88.0 07/28/2024 11:25 AM   HDL 56.30 07/28/2024 11:25 AM   HDL 64 09/14/2021 09:19 AM   CHOLHDL 3 07/28/2024 11:25 AM   LDLCALC 103 (H) 07/28/2024 11:25 AM   LDLCALC 105 (H) 09/14/2021 09:19 AM   LDLDIRECT 177.2 08/04/2013 11:47 AM    Wt Readings from Last 3 Encounters:  07/30/24 192 lb (87.1 kg)  07/28/24 192 lb 2 oz (87.1 kg)  01/20/24 189 lb (85.7 kg)     Objective:    Vital Signs:  BP 132/72 (BP Location: Right Arm, Patient Position: Sitting, Cuff Size: Large)   Pulse 79   Ht 5' 3 (1.6 m)   Wt 192 lb (87.1 kg)   LMP  (LMP Unknown)   SpO2 96%   BMI 34.01 kg/m    Affect appropriate Healthy:  appears stated age HEENT: post right thyroidectomy  Neck supple with  no adenopathy JVP normal no bruits no thyromegaly Lungs clear with no wheezing and good diaphragmatic motion Heart:  S1/S2 no murmur, no rub, gallop or click PMI normal Abdomen: benighn, BS positve, no tenderness, no AAA no bruit.  No HSM or HJR Distal pulses intact with no bruits No edema Neuro non-focal Skin warm and dry No muscular weakness   ASSESSMENT & PLAN:    PAF :  On xarelto  and Toprol  asymptomatic given age would not start AAT  Hct 36.9 PLT 192 Cr 0.75 07/28/24  Dose of DOAC still  Hypothyroid on replacement followed by PCP TSH 1.9 07/28/24  post right thyroidectomy for Hurthle cell neoplasm HLD:  On statin labs with primary  Breast Cancer:  remission post XRT and Arimidex  2015   Disposition:  Follow up in a year  Signed, Maude Emmer, MD  07/30/2024 10:49 AM    [facility]

## 2024-07-28 ENCOUNTER — Encounter (HOSPITAL_BASED_OUTPATIENT_CLINIC_OR_DEPARTMENT_OTHER): Payer: Self-pay

## 2024-07-28 ENCOUNTER — Ambulatory Visit: Admitting: Internal Medicine

## 2024-07-28 ENCOUNTER — Encounter: Payer: Self-pay | Admitting: Internal Medicine

## 2024-07-28 VITALS — BP 136/68 | HR 51 | Temp 97.7°F | Resp 16 | Ht 63.0 in | Wt 192.1 lb

## 2024-07-28 DIAGNOSIS — I10 Essential (primary) hypertension: Secondary | ICD-10-CM | POA: Diagnosis not present

## 2024-07-28 DIAGNOSIS — E038 Other specified hypothyroidism: Secondary | ICD-10-CM | POA: Diagnosis not present

## 2024-07-28 DIAGNOSIS — I480 Paroxysmal atrial fibrillation: Secondary | ICD-10-CM

## 2024-07-28 DIAGNOSIS — Z23 Encounter for immunization: Secondary | ICD-10-CM

## 2024-07-28 DIAGNOSIS — Z853 Personal history of malignant neoplasm of breast: Secondary | ICD-10-CM | POA: Diagnosis not present

## 2024-07-28 DIAGNOSIS — E785 Hyperlipidemia, unspecified: Secondary | ICD-10-CM | POA: Diagnosis not present

## 2024-07-28 DIAGNOSIS — G4700 Insomnia, unspecified: Secondary | ICD-10-CM

## 2024-07-28 DIAGNOSIS — R739 Hyperglycemia, unspecified: Secondary | ICD-10-CM | POA: Diagnosis not present

## 2024-07-28 DIAGNOSIS — I4891 Unspecified atrial fibrillation: Secondary | ICD-10-CM | POA: Diagnosis not present

## 2024-07-28 DIAGNOSIS — E66811 Obesity, class 1: Secondary | ICD-10-CM

## 2024-07-28 LAB — CBC WITH DIFFERENTIAL/PLATELET
Basophils Absolute: 0 K/uL (ref 0.0–0.1)
Basophils Relative: 0.5 % (ref 0.0–3.0)
Eosinophils Absolute: 0.1 K/uL (ref 0.0–0.7)
Eosinophils Relative: 1.7 % (ref 0.0–5.0)
HCT: 36.9 % (ref 36.0–46.0)
Hemoglobin: 12.1 g/dL (ref 12.0–15.0)
Lymphocytes Relative: 17.7 % (ref 12.0–46.0)
Lymphs Abs: 1 K/uL (ref 0.7–4.0)
MCHC: 32.8 g/dL (ref 30.0–36.0)
MCV: 92.8 fl (ref 78.0–100.0)
Monocytes Absolute: 0.3 K/uL (ref 0.1–1.0)
Monocytes Relative: 5.1 % (ref 3.0–12.0)
Neutro Abs: 4.4 K/uL (ref 1.4–7.7)
Neutrophils Relative %: 75 % (ref 43.0–77.0)
Platelets: 192 K/uL (ref 150.0–400.0)
RBC: 3.97 Mil/uL (ref 3.87–5.11)
RDW: 13.7 % (ref 11.5–15.5)
WBC: 5.9 K/uL (ref 4.0–10.5)

## 2024-07-28 LAB — HEMOGLOBIN A1C: Hgb A1c MFr Bld: 6.3 % (ref 4.6–6.5)

## 2024-07-28 LAB — COMPREHENSIVE METABOLIC PANEL WITH GFR
ALT: 11 U/L (ref 0–35)
AST: 18 U/L (ref 0–37)
Albumin: 3.9 g/dL (ref 3.5–5.2)
Alkaline Phosphatase: 82 U/L (ref 39–117)
BUN: 14 mg/dL (ref 6–23)
CO2: 28 meq/L (ref 19–32)
Calcium: 9.1 mg/dL (ref 8.4–10.5)
Chloride: 103 meq/L (ref 96–112)
Creatinine, Ser: 0.75 mg/dL (ref 0.40–1.20)
GFR: 73.72 mL/min (ref >60.00)
Glucose, Bld: 99 mg/dL (ref 70–99)
Potassium: 3.8 meq/L (ref 3.5–5.1)
Sodium: 142 meq/L (ref 135–145)
Total Bilirubin: 0.8 mg/dL (ref 0.2–1.2)
Total Protein: 6.5 g/dL (ref 6.0–8.3)

## 2024-07-28 LAB — TSH: TSH: 1.9 u[IU]/mL (ref 0.35–5.50)

## 2024-07-28 LAB — LIPID PANEL
Cholesterol: 177 mg/dL (ref 0–200)
HDL: 56.3 mg/dL (ref >39.00)
LDL Cholesterol: 103 mg/dL — ABNORMAL HIGH (ref 0–99)
NonHDL: 120.49
Total CHOL/HDL Ratio: 3
Triglycerides: 88 mg/dL (ref 0.0–149.0)
VLDL: 17.6 mg/dL (ref 0.0–40.0)

## 2024-07-28 LAB — MAGNESIUM: Magnesium: 1.8 mg/dL (ref 1.5–2.5)

## 2024-07-28 NOTE — Progress Notes (Signed)
 Subjective:    Patient ID: Lori Joseph, female    DOB: 20-Dec-1940, 83 y.o.   MRN: [id number]  DOS:  07/28/2024 Type of visit - description: Follow-up  Chronic medical problems addressed. On Xarelto , denies nausea or vomiting.  No blood in the stools or in the urine. History of A-fib, denies chest pain or difficulty breathing.  No edema, no palpitations. Good compliance with thyroid  medications.   Review of Systems See above   Past Medical History:  Diagnosis Date   Anal condyloma Warts    Complication of anesthesia    slow to awaken after colonscopy yrs ago   Eczema    hands/legs   Family history of anesthesia complication    daughter hard to wake up   Glaucoma    H/O acute pancreatitis    yrs ago   H/O retinal detachment    History of Anemia    History of COVID-19 10/2021   congestion, diarrhea, flu like symptoms x 2 weeks al symptoms resolved   History of external beam radiation therapy    11-11-2015  to  12-30-2014   left breast 50.4 gray, lumpectomy cavity boosted to 62.4 gray   Hurthle cell neoplasm of thyroid     Right  us  thryoid 10-26-2021 epic, biopsy done 12-20-2021   Hyperlipidemia    Hypertension    Hypothyroidism 05/31/2015   Osteopenia    PAF (paroxysmal atrial fibrillation) (HCC) 03/2017   pressure increased in both eyes not glaucoma    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   Seasonal allergies    Vitamin D  deficiency    Wears partial dentures upper and lower     Past Surgical History:  Procedure Laterality Date   BREAST BIOPSY Left 08/27/2014   BREAST BIOPSY Left 11/27/2016   BREAST LUMPECTOMY Left 09/15/2014   CHOLECYSTECTOMY  2000   colonscopy  2018   DILATION AND CURETTAGE OF UTERUS     yrs ago   LASER ABLATION CONDOLAMATA N/A 12/22/2021   Procedure: LASER REMOVAL/ABLATION CONDOLAMATA;  Surgeon: Sheldon Standing, MD;  Location: [facility];  Service: General;  Laterality: N/A;   laser eye surgery, detached retina Left    yrs ago   RADIOACTIVE SEED GUIDED PARTIAL MASTECTOMY WITH AXILLARY SENTINEL LYMPH NODE BIOPSY Left 09/16/2014   Procedure: RADIOACTIVE SEED GUIDED PARTIAL MASTECTOMY WITH AXILLARY SENTINEL LYMPH NODE BIOPSY;  Surgeon: Jina Nephew, MD;  Location: [facility];  Service: General;  Laterality: Left;   RECTAL EXAM UNDER ANESTHESIA N/A 12/22/2021   Procedure: ANORECTAL EXAM UNDER ANESTHESIA excision of perirectal mass  Hemorrhoidal ligation and removal;  Surgeon: Sheldon Standing, MD;  Location: [facility];  Service: General;  Laterality: N/A;   THYROID  LOBECTOMY N/A 02/10/2015   Procedure: RIGHT THYROID  LOBECTOMY;  Surgeon: Jina Nephew, MD;  Location: [facility];  Service: General;  Laterality: N/A;   TONSILLECTOMY     age 81 adenoids remved also   TUBAL LIGATION     yrs ago    Current Outpatient Medications  Medication Instructions   albuterol  (VENTOLIN  HFA) 108 (90 Base) MCG/ACT inhaler 1-2 puffs, Inhalation, Every 6 hours PRN   calcium  carbonate 2,500 mg, Every morning   cholecalciferol  (VITAMIN D ) 2,500 Units, Every morning   dorzolamide -timolol  (COSOPT ) 22.3-6.8 MG/ML ophthalmic solution 1 drop, Every morning   hydrochlorothiazide  (HYDRODIURIL ) 12.5 mg, Oral, Daily  latanoprost  (XALATAN ) 0.005 % ophthalmic solution PLACE 1 DROP INTO BOTH EYES NIGHTLY.   levothyroxine  (SYNTHROID ) 50 mcg, Oral, Daily before breakfast   Melatonin 10 MG TABS Daily at bedtime   metoprolol  succinate (TOPROL -XL) 50 mg, Oral, Daily, Take with or immediately following a meal   Multiple Vitamin (MULTIVITAMIN) tablet 1 tablet, Every morning   pravastatin  (PRAVACHOL ) 40 mg, Oral, Daily   Xarelto  20 mg, Oral, Daily with supper       Objective:   Physical Exam BP 136/68   Pulse (!) 51   Temp 97.7 F (36.5 C) (Oral)   Resp 16   Ht 5' 3 (1.6 m)   Wt 192 lb 2 oz (87.1 kg)   LMP  (LMP Unknown)    SpO2 99%   BMI 34.03 kg/m  General:   Well developed, NAD, BMI noted. HEENT:  Normocephalic . Face symmetric, atraumatic Lungs:  CTA B Normal respiratory effort, no intercostal retractions, no accessory muscle use. Heart: Irregularly irregular.  Lower extremities: no pretibial edema bilaterally  Skin: Not pale. Not jaundice Neurologic:  alert & oriented X3.  Speech normal, gait appropriate for age and unassisted Psych--  Cognition and judgment appear intact.  Cooperative with normal attention span and concentration.  Behavior appropriate. No anxious or depressed appearing.      Assessment   Assessment  Hyperglycemia (a1c= 6.0 11.2019) HTN Hyperlipidemia Hypothyroidism Osteopenia: T score 2010 normal,  T score 05-2012 (-1.2), T score 03/2018 -1.1; Tscore -1.1 (2022) on calcium  and vitamin D  Obesity Insomnia: INTOLERANT to zolpidem  CV: -A. fib, new onset 03-2017 -Low risk stress test 04-2017 Oncology: --Breast cancer, left, dx  2015, s/p XRT 2016, on Arimidex  x 5 years; Oncology release her --Hurthle Cell neoplasm of the thyroid , R thyroidectomy lobe  01-2015 SKIN: ---Eczema ---[facility] 2012  ANEMIA- saw hematology, rx IV iron 10-2015 Chronic right-sided chest pain:  x-rays CT abdomen and pelvis 2016 negative, saw pain mngmt, ortho ---> better with Lidoderm  patch and a local injection in the back Glaucoma   H/o acute pancreatitis H/o retinal  detachment Pulmonary: Abnormal chest x-ray and CT chest: Last CT chest 01-2016: See report. CXR 03/27/2016: No acute. Pulmonary: f/u prn ++ FH Lung Ca (pt is not a smoker)  PLAN: Hyperglycemia: Check A1c HTN: BP today is good, recommend ambulatory BPs, continue HCTZ, metoprolol .  Checking labs. Hyperlipidemia: On Pravachol , check FLP. Hypothyroidism: On Synthroid , check TSH, further advised for results. A-fib: Rate controlled, anticoagulated, seems stable, to see cardiology soon. History of breast cancer: Released by oncology, last  mammogram 05/22/2024. Insomnia: Currently controlled on magnesium .  Checking levels. Obesity, BMI 34.  Diet discussed. Vaccine advised: Flu shot today, consider COVID booster. RTC December or January  for CPX

## 2024-07-28 NOTE — Assessment & Plan Note (Signed)
 Hyperglycemia: Check A1c HTN: BP today is good, recommend ambulatory BPs, continue HCTZ, metoprolol .  Checking labs. Hyperlipidemia: On Pravachol , check FLP. Hypothyroidism: On Synthroid , check TSH, further advised for results. A-fib: Rate controlled, anticoagulated, seems stable, to see cardiology soon. History of breast cancer: Released by oncology, last mammogram 05/22/2024. Insomnia: Currently controlled on magnesium .  Checking levels. Obesity, BMI 34.  Diet discussed. Vaccine advised: Flu shot today, consider COVID booster. RTC December or January  for CPX

## 2024-07-28 NOTE — Patient Instructions (Signed)
 You got flu shot today. Consider a COVID booster   Check the  blood pressure regularly Blood pressure goal:  between 110/65 and  135/85. If it is consistently higher or lower, let me know     GO TO THE LAB :  Get the blood work   Your results will be posted on MyChart with my comments  Go to the front desk for the checkout Please make an appointment for a physical exam by December 2025

## 2024-07-29 ENCOUNTER — Ambulatory Visit: Payer: Self-pay | Admitting: Internal Medicine

## 2024-07-30 ENCOUNTER — Ambulatory Visit: Attending: Cardiovascular Disease | Admitting: Cardiovascular Disease

## 2024-07-30 VITALS — BP 132/72 | HR 79 | Ht 63.0 in | Wt 192.0 lb

## 2024-07-30 DIAGNOSIS — Z5181 Encounter for therapeutic drug level monitoring: Secondary | ICD-10-CM

## 2024-07-30 DIAGNOSIS — I10 Essential (primary) hypertension: Secondary | ICD-10-CM

## 2024-07-30 DIAGNOSIS — Z7901 Long term (current) use of anticoagulants: Secondary | ICD-10-CM

## 2024-07-30 DIAGNOSIS — I482 Chronic atrial fibrillation, unspecified: Secondary | ICD-10-CM

## 2024-07-30 NOTE — Patient Instructions (Signed)

## 2024-08-24 ENCOUNTER — Ambulatory Visit: Payer: Self-pay

## 2024-08-24 NOTE — Telephone Encounter (Signed)
 FYI Only or Action Required?: FYI only for provider.  Patient was last seen in primary care on 07/28/2024 by Lori Aloysius BRAVO, MD.  Called Nurse Triage reporting Dysuria.  Symptoms began 1-2 days ago.  Interventions attempted: Rest, hydration, or home remedies.  Symptoms are: unchanged.  Triage Disposition: See Physician Within 24 Hours  Patient/caregiver understands and will follow disposition?: Yes      Copied from CRM #[id number]. Topic: Clinical - Red Word Triage >> Aug 24, 2024  4:49 PM Shereese L wrote: [facility] that prompted transfer to Nurse Triage: Uti or bladder infection, hurting when using the bathroom, using the bathroom frequently        Reason for Disposition  Age > 50 years  Answer Assessment - Initial Assessment Questions 1. SEVERITY: How bad is the pain?  (e.g., Scale 1-10; mild, moderate, or severe)     Mild to moderate  3. PATTERN: Is pain present every time you urinate or just sometimes?      Every urination  4. ONSET: When did the painful urination start?      1 day ago  5. FEVER: Do you have a fever? If Yes, ask: What is your temperature, how was it measured, and when did it start?     No 6. PAST UTI: Have you had a urine infection before? If Yes, ask: When was the last time? and What happened that time?      Yes 7. CAUSE: What do you think is causing the painful urination?  (e.g., UTI, scratch, Herpes sore)     Possible UTI 8. OTHER SYMPTOMS: Do you have any other symptoms? (e.g., blood in urine, flank pain, genital sores, urgency, vaginal discharge)     Increased urinary frequency  Protocols used: Urination Pain - Female-A-AH

## 2024-08-24 NOTE — Telephone Encounter (Signed)
 Appt scheduled

## 2024-08-25 ENCOUNTER — Ambulatory Visit (INDEPENDENT_AMBULATORY_CARE_PROVIDER_SITE_OTHER): Admitting: Family Medicine

## 2024-08-25 ENCOUNTER — Encounter: Payer: Self-pay | Admitting: Family Medicine

## 2024-08-25 VITALS — BP 128/64 | HR 80 | Temp 96.8°F | Resp 16 | Ht 63.0 in | Wt 189.8 lb

## 2024-08-25 DIAGNOSIS — N3001 Acute cystitis with hematuria: Secondary | ICD-10-CM | POA: Diagnosis not present

## 2024-08-25 LAB — POC URINALSYSI DIPSTICK (AUTOMATED)
Bilirubin, UA: NEGATIVE
Glucose, UA: NEGATIVE
Ketones, UA: NEGATIVE
Nitrite, UA: NEGATIVE
Protein, UA: POSITIVE — AB
Spec Grav, UA: <=1.005 — AB (ref 1.010–1.025)
Urobilinogen, UA: 0.2 U/dL
pH, UA: 6.5 (ref 5.0–8.0)

## 2024-08-25 MED ORDER — CEPHALEXIN 500 MG PO CAPS: 500.0000 mg | ORAL_CAPSULE | Freq: Three times a day (TID) | ORAL | 0 refills | Status: AC

## 2024-08-25 MED ORDER — FLUCONAZOLE 150 MG PO TABS: ORAL_TABLET | ORAL | 0 refills | Status: DC

## 2024-08-25 NOTE — Patient Instructions (Signed)
 Stay hydrated.   Warning signs/symptoms: Uncontrollable nausea/vomiting, fevers, worsening symptoms despite treatment, confusion.  Give [name] around 2 business days to get culture back to you.  Let us know if you need anything.

## 2024-08-25 NOTE — Progress Notes (Signed)
 Chief Complaint  Patient presents with   Dysuria    Dysuria    Lori Joseph is a 83 y.o. female here for possible UTI.  Duration: 3 days. Symptoms: Dysuria, urinary frequency and urgency Denies: hematuria, urinary hesitancy, urinary retention, fever, nausea, vomiting, flank pain, vaginal discharge Hx of recurrent UTI? Has had 2 infections prior to this 2 months apart Denies new sexual partners.  Past Medical History:  Diagnosis Date   Anal condyloma Warts    Complication of anesthesia    slow to awaken after colonscopy yrs ago   Eczema    hands/legs   Family history of anesthesia complication    daughter hard to wake up   Glaucoma    H/O acute pancreatitis    yrs ago   H/O retinal detachment    History of Anemia    History of COVID-19 10/2021   congestion, diarrhea, flu like symptoms x 2 weeks al symptoms resolved   History of external beam radiation therapy    11-11-2015  to  12-30-2014   left breast 50.4 gray, lumpectomy cavity boosted to 62.4 gray   Hurthle cell neoplasm of thyroid     Right  us  thryoid 10-26-2021 epic, biopsy done 12-20-2021   Hyperlipidemia    Hypertension    Hypothyroidism 05/31/2015   Osteopenia    PAF (paroxysmal atrial fibrillation) (HCC) 03/2017   pressure increased in both eyes not glaucoma    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   Seasonal allergies    Vitamin D  deficiency    Wears partial dentures upper and lower      BP 128/64 (BP Location: Left Arm, Patient Position: Sitting)   Pulse 80   Temp (!) 96.8 F (36 C) (Oral)   Resp 16   Ht 5' 3 (1.6 m)   Wt 189 lb 12.8 oz (86.1 kg)   LMP  (LMP Unknown)   SpO2 95%   BMI 33.62 kg/m  General: Awake, alert, appears stated age Heart: RRR Lungs: CTAB, normal respiratory effort, no accessory muscle usage Abd: BS+, soft, NT, ND, no masses or organomegaly MSK: No CVA tenderness, neg Lloyd's  sign Psych: Age appropriate judgment and insight  Acute cystitis with hematuria - Plan: POCT Urinalysis Dipstick (Automated), Urine Culture, cephALEXin  (KEFLEX ) 500 MG capsule  UA suggestive of infection. Ck cx. Stay hydrated. Seek immediate care if pt starts to develop fevers, new/worsening symptoms, uncontrollable N/V. F/u prn. The patient voiced understanding and agreement to the plan.  Mabel [place], DO 08/25/24 1:39 PM

## 2024-08-28 ENCOUNTER — Ambulatory Visit: Payer: Self-pay | Admitting: Family Medicine

## 2024-08-28 LAB — URINE CULTURE
MICRO NUMBER:: 17035759
SPECIMEN QUALITY:: ADEQUATE

## 2024-09-04 ENCOUNTER — Other Ambulatory Visit: Payer: Self-pay | Admitting: Internal Medicine

## 2024-09-10 ENCOUNTER — Ambulatory Visit (INDEPENDENT_AMBULATORY_CARE_PROVIDER_SITE_OTHER)

## 2024-09-10 VITALS — Ht 63.0 in | Wt 189.0 lb

## 2024-09-10 DIAGNOSIS — Z0000 Encounter for general adult medical examination without abnormal findings: Secondary | ICD-10-CM

## 2024-09-10 NOTE — Patient Instructions (Addendum)
 Lori Joseph,  Thank you for taking the time for your Medicare Wellness Visit. I appreciate your continued commitment to your health goals. Please review the care plan we discussed, and feel free to reach out if I can assist you further.  Medicare recommends these wellness visits once per year to help you and your care team stay ahead of potential health issues. These visits are designed to focus on prevention, allowing your provider to concentrate on managing your acute and chronic conditions during your regular appointments.  Please note that Annual Wellness Visits do not include a physical exam. Some assessments may be limited, especially if the visit was conducted virtually. If needed, we may recommend a separate in-person follow-up with your provider.  Ongoing Care Seeing your primary care provider every 3 to 6 months helps us  monitor your health and provide consistent, personalized care.   Referrals If a referral was made during today's visit and you haven't received any updates within two weeks, please contact the referred provider directly to check on the status.  Recommended Screenings:  Health Maintenance  Topic Date Due   COVID-19 Vaccine (4 - 2025-26 season) 07/27/2024   Breast Cancer Screening  05/22/2025   Medicare Annual Wellness Visit  09/10/2025   DTaP/Tdap/Td vaccine (4 - Td or Tdap) 10/28/2029   Pneumococcal Vaccine for age over 36  Completed   Flu Shot  Completed   DEXA scan (bone density measurement)  Completed   Zoster (Shingles) Vaccine  Completed   Meningitis B Vaccine  Aged Out   Hepatitis C Screening  Discontinued       09/10/2024    9:44 AM  Advanced Directives  Does Patient Have a Medical Advance Directive? Yes  Type of Estate agent of [place];Living will  Does patient want to make changes to medical advance directive? No - Patient declined  Copy of Healthcare Power of Attorney in Chart? Yes - validated most recent copy scanned in  chart (See row information)   Advance Care Planning is important because it: Ensures you receive medical care that aligns with your values, goals, and preferences. Provides guidance to your family and loved ones, reducing the emotional burden of decision-making during critical moments.  Vision: Annual vision screenings are recommended for early detection of glaucoma, cataracts, and diabetic retinopathy. These exams can also reveal signs of chronic conditions such as diabetes and high blood pressure.  Dental: Annual dental screenings help detect early signs of oral cancer, gum disease, and other conditions linked to overall health, including heart disease and diabetes.  Please see the attached documents for additional preventive care recommendations.

## 2024-09-10 NOTE — Progress Notes (Signed)
 Subjective:   Lori Joseph is a 83 y.o. who presents for a Medicare Wellness preventive visit.  As a reminder, Annual Wellness Visits don't include a physical exam, and some assessments may be limited, especially if this visit is performed virtually. We may recommend an in-person follow-up visit with your provider if needed.  Visit Complete: Virtual I connected with  Lori Joseph on 09/10/24 by a audio enabled telemedicine application and verified that I am speaking with the correct person using two identifiers.  Patient Location: Home  Provider Location: Home Office  I discussed the limitations of evaluation and management by telemedicine. The patient expressed understanding and agreed to proceed.  Vital Signs: Because this visit was a virtual/telehealth visit, some criteria may be missing or patient reported. Any vitals not documented were not able to be obtained and vitals that have been documented are patient reported.    Persons Participating in Visit: Patient.  AWV Questionnaire: No: Patient Medicare AWV questionnaire was not completed prior to this visit.  Cardiac Risk Factors include: advanced age (>37men, >78 women);hypertension     Objective:    Today's Vitals   09/10/24 0937  Weight: 189 lb (85.7 kg)  Height: 5' 3 (1.6 m)   Body mass index is 33.48 kg/m.     09/10/2024    9:44 AM 11/03/2023    5:43 PM 12/22/2021    6:04 AM 10/29/2018    5:00 PM 01/01/2017    9:17 AM 12/24/2016    2:22 PM 12/24/2016    2:21 PM  Advanced Directives  Does Patient Have a Medical Advance Directive? Yes No Yes No  No  No  No   Type of Estate agent of [place];Living will  Healthcare Power of [place];Living will      Does patient want to make changes to medical advance directive? No - Patient declined  No - Patient declined      Copy of Healthcare Power of Attorney in Chart? Yes - validated most recent copy scanned in chart (See row information)  No - copy  requested      Would patient like information on creating a medical advance directive?    No - Patient declined  No - Patient declined        Data saved with a previous flowsheet row definition    Current Medications (verified) Outpatient Encounter Medications as of 09/10/2024  Medication Sig   albuterol  (VENTOLIN  HFA) 108 (90 Base) MCG/ACT inhaler Inhale 1-2 puffs into the lungs every 6 (six) hours as needed for wheezing or shortness of breath. (Patient not taking: Reported on 07/30/2024)   calcium  carbonate 1250 MG capsule Take 2,500 mg by mouth every morning.    cholecalciferol  (VITAMIN D ) 1000 UNITS tablet Take 2,500 Units by mouth every morning.    dorzolamide -timolol  (COSOPT ) 22.3-6.8 MG/ML ophthalmic solution Place 1 drop into both eyes every morning.    fluconazole (DIFLUCAN) 150 MG tablet Take 1 tab, repeat in 72 hours if no improvement.   hydrochlorothiazide  (HYDRODIURIL ) 25 MG tablet TAKE 1/2 TABLET BY MOUTH EVERY DAY   latanoprost  (XALATAN ) 0.005 % ophthalmic solution PLACE 1 DROP INTO BOTH EYES NIGHTLY.   levothyroxine  (SYNTHROID ) 50 MCG tablet Take 1 tablet (50 mcg total) by mouth daily before breakfast.   Melatonin 10 MG TABS Take by mouth at bedtime.   metoprolol  succinate (TOPROL -XL) 50 MG 24 hr tablet Take 1 tablet (50 mg total) by mouth daily. Take with or immediately following a meal  Multiple Vitamin (MULTIVITAMIN) tablet Take 1 tablet by mouth every morning.   pravastatin  (PRAVACHOL ) 40 MG tablet TAKE 1 TABLET BY MOUTH EVERY DAY   XARELTO  20 MG TABS tablet TAKE 1 TABLET BY MOUTH DAILY WITH SUPPER.   No facility-administered encounter medications on file as of 09/10/2024.    Allergies (verified) Codeine, Collagen, Hyaluronic acid  [collagen-chond-hyaluronic acid], Carvedilol, Felodipine, Lisinopril, Losartan potassium, Zolpidem , and Radiaplexrx [pyridoxine-zinc picolinate]   History: Past Medical History:  Diagnosis Date   Anal condyloma Warts    Complication of  anesthesia    slow to awaken after colonscopy yrs ago   Eczema    hands/legs   Family history of anesthesia complication    daughter hard to wake up   Glaucoma    H/O acute pancreatitis    yrs ago   H/O retinal detachment    History of Anemia    History of COVID-19 10/2021   congestion, diarrhea, flu like symptoms x 2 weeks al symptoms resolved   History of external beam radiation therapy    11-11-2015  to  12-30-2014   left breast 50.4 gray, lumpectomy cavity boosted to 62.4 gray   Hurthle cell neoplasm of thyroid     Right  us  thryoid 10-26-2021 epic, biopsy done 12-20-2021   Hyperlipidemia    Hypertension    Hypothyroidism 05/31/2015   Osteopenia    PAF (paroxysmal atrial fibrillation) (HCC) 03/2017   pressure increased in both eyes not glaucoma    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   Seasonal allergies    Vitamin D  deficiency    Wears partial dentures upper and lower    Past Surgical History:  Procedure Laterality Date   BREAST BIOPSY Left 08/27/2014   BREAST BIOPSY Left 11/27/2016   BREAST LUMPECTOMY Left 09/15/2014   CHOLECYSTECTOMY  2000   colonscopy  2018   DILATION AND CURETTAGE OF UTERUS     yrs ago   LASER ABLATION CONDOLAMATA N/A 12/22/2021   Procedure: LASER REMOVAL/ABLATION CONDOLAMATA;  Surgeon: Sheldon Standing, MD;  Location: [facility] [place];  Service: General;  Laterality: N/A;   laser eye surgery, detached retina Left    yrs ago   RADIOACTIVE SEED GUIDED PARTIAL MASTECTOMY WITH AXILLARY SENTINEL LYMPH NODE BIOPSY Left 09/16/2014   Procedure: RADIOACTIVE SEED GUIDED PARTIAL MASTECTOMY WITH AXILLARY SENTINEL LYMPH NODE BIOPSY;  Surgeon: Jina Nephew, MD;  Location: [facility];  Service: General;  Laterality: Left;   RECTAL EXAM UNDER ANESTHESIA N/A 12/22/2021   Procedure: ANORECTAL EXAM UNDER ANESTHESIA excision of perirectal mass   Hemorrhoidal ligation and removal;  Surgeon: Sheldon Standing, MD;  Location: [name];  Service: General;  Laterality: N/A;   THYROID  LOBECTOMY N/A 02/10/2015   Procedure: RIGHT THYROID  LOBECTOMY;  Surgeon: Jina Nephew, MD;  Location: [facility];  Service: General;  Laterality: N/A;   TONSILLECTOMY     age 76 adenoids remved also   TUBAL LIGATION     yrs ago   Family History  Problem Relation Age of Onset   Stroke Mother    Hyperlipidemia Mother    Hypertension Mother    Hypertension Father    Cancer Father        lung cancer ; smoker   Breast cancer Sister 39   Kidney cancer Sister 46   Breast cancer Sister 59       passed  56   Melanoma Sister 13   Cancer Brother        3 brothers with lung cancer, all smokers   Cancer Paternal Uncle        2 pat uncles with lung cancer, smokers   Ovarian cancer Other 21       niece with ovarian cancer (related through sister with breast cancer)   Colon cancer Neg Hx    CAD Neg Hx    Esophageal cancer Neg Hx    Stomach cancer Neg Hx    Rectal cancer Neg Hx    Social History   Socioeconomic History   Marital status: Married    Spouse name: Layman   Number of children: 2   Years of education: Not on file   Highest education level: Not on file  Occupational History   Occupation: RETIRED-- Leisure centre manager: [employer]  Tobacco Use   Smoking status: Never   Smokeless tobacco: Never  Vaping Use   Vaping status: Never Used  Substance and Sexual Activity   Alcohol use: No    Alcohol/week: 0.0 standard drinks of alcohol   Drug use: No   Sexual activity: Not Currently    Birth control/protection: Post-menopausal  Other Topics Concern   Not on file  Social History Narrative   5 g- children    Lives w/ husband   Social Drivers of Corporate investment banker Strain: Low Risk  (09/10/2024)   Overall Financial Resource Strain (CARDIA)    Difficulty of Paying Living Expenses: Not hard at all   Food Insecurity: No Food Insecurity (09/10/2024)   Hunger Vital Sign    Worried About Running Out of Food in the Last Year: Never true    Ran Out of Food in the Last Year: Never true  Transportation Needs: No Transportation Needs (09/10/2024)   PRAPARE - Administrator, Civil Service (Medical): No    Lack of Transportation (Non-Medical): No  Physical Activity: Inactive (09/10/2024)   Exercise Vital Sign    Days of Exercise per Week: 0 days    Minutes of Exercise per Session: 0 min  Stress: No Stress Concern Present (09/10/2024)   [employer] of Occupational Health - Occupational Stress Questionnaire    Feeling of Stress: Not at all  Social Connections: Socially Integrated (09/10/2024)   Social Connection and Isolation Panel    Frequency of Communication with Friends and Family: More than three times a week    Frequency of Social Gatherings with Friends and Family: More than three times a week    Attends Religious Services: More than 4 times per year    Active Member of [employer] or Organizations: Yes    Attends Engineer, structural: More than 4 times per year    Marital Status: Married    Tobacco Counseling Counseling given: Not Answered    Clinical Intake:  Pre-visit preparation completed: Yes  Pain : No/denies pain     BMI - recorded: 33.48 Nutritional Status: BMI > 30  Obese Nutritional Risks: None Diabetes: No  Lab Results  Component Value Date   HGBA1C 6.3 07/28/2024   HGBA1C 6.1 11/13/2023   HGBA1C 6.2 11/08/2022     How often do you need to have someone help you when you read instructions, pamphlets, or other written materials from your doctor or pharmacy?: 1 - Never  Interpreter Needed?: No  Information entered by :: Rojelio Blush LPN   Activities of Daily Living  09/10/2024    9:42 AM 07/28/2024   10:45 AM  In your present state of health, do you have any difficulty performing the following activities:  Hearing? 0 0   Vision? 0 0  Difficulty concentrating or making decisions? 0 0  Walking or climbing stairs? 0 0  Dressing or bathing? 0 0  Doing errands, shopping? 0 0  Preparing Food and eating ? N   Using the Toilet? N   In the past six months, have you accidently leaked urine? Y   Comment Wears a Pad on occasions.   Do you have problems with loss of bowel control? N   Managing your Medications? N   Managing your Finances? N   Housekeeping or managing your Housekeeping? N     Patient Care Team: Amon Aloysius BRAVO, MD as PCP - General Delford Maude BROCKS, MD as PCP - Cardiology (Cardiology) Odean Potts, MD as Consulting Physician (Hematology and Oncology) Aron Shoulders, MD as Consulting Physician (General Surgery) Latisha Medford, MD as Consulting Physician (Obstetrics and Gynecology) Abran Norleen SAILOR, MD as Consulting Physician (Gastroenterology) Shannon Agent, MD as Consulting Physician (Radiation Oncology) Ivin Kocher, MD as Consulting Physician (Dermatology) Sheldon Standing, MD as Consulting Physician (Colon and Rectal Surgery)  I have updated your Care Teams any recent Medical Services you may have received from other providers in the past year.     Assessment:   This is a routine wellness examination for [place].  Hearing/Vision screen Hearing Screening - Comments:: Denies hearing difficulties   Vision Screening - Comments:: Wears rx glasses - up to date with routine eye exams with  [facility]   Goals Addressed               This Visit's Progress     Increase physical activity (pt-stated)        Remain active.       Depression Screen     09/10/2024    9:41 AM 08/25/2024    1:11 PM 07/28/2024   10:47 AM 11/13/2023    9:43 AM 03/26/2023    1:45 PM 03/08/2023    1:08 PM 11/08/2022    9:25 AM  PHQ 2/9 Scores  PHQ - 2 Score 0 0 0 1 0 0 1  PHQ- 9 Score 0 2 4        Fall Risk     09/10/2024    9:43 AM 08/25/2024    1:11 PM 07/28/2024   10:45 AM 11/13/2023    9:43 AM  03/26/2023    1:44 PM  Fall Risk   Falls in the past year? 0 0 0 1 1  Number falls in past yr: 0 0 0 0 0  Injury with Fall? 0 0 0 0 0  Risk for fall due to : No Fall Risks      Follow up Falls evaluation completed Falls evaluation completed Falls evaluation completed;Education provided Falls evaluation completed;Education provided Falls evaluation completed    MEDICARE RISK AT HOME:  Medicare Risk at Home Any stairs in or around the home?: No If so, are there any without handrails?: No Home free of loose throw rugs in walkways, pet beds, electrical cords, etc?: Yes Adequate lighting in your home to reduce risk of falls?: Yes Life alert?: No Use of a cane, walker or w/c?: No Grab bars in the bathroom?: Yes Shower chair or bench in shower?: Yes Elevated toilet seat or a handicapped toilet?: Yes  TIMED UP AND GO:  Was the  test performed?  No  Cognitive Function: 6CIT completed    01/01/2017    9:22 AM  MMSE - Mini Mental State Exam  Orientation to time 5   Orientation to Place 5   Registration 3   Attention/ Calculation 5   Recall 3   Language- name 2 objects 2   Language- repeat 0  Language- follow 3 step command 3   Language- read & follow direction 1   Write a sentence 1   Copy design 1   Total score 29      Data saved with a previous flowsheet row definition        09/10/2024    9:44 AM  6CIT Screen  What Year? 0 points  What month? 0 points  What time? 0 points  Count back from 20 0 points  Months in reverse 0 points  Repeat phrase 0 points  Total Score 0 points    Immunizations Immunization History  Administered Date(s) Administered   Fluad Quad(high Dose 65+) 08/13/2019, 09/15/2020, 09/12/2021, 09/06/2022   H1N1 11/25/2008   INFLUENZA, HIGH DOSE SEASONAL PF 10/16/2013, 08/24/2015, 09/14/2016, 09/25/2017, 08/19/2018, 07/28/2024   Influenza Whole 10/06/2008, 09/28/2009, 08/22/2010   Influenza,inj,Quad PF,6+ Mos 08/05/2014   Influenza-Unspecified  09/13/2023   PFIZER(Purple Top)SARS-COV-2 Vaccination 12/20/2019, 01/11/2020, 10/09/2020   PNEUMOCOCCAL CONJUGATE-20 11/08/2022   Pneumococcal Conjugate-13 08/05/2014   Pneumococcal Polysaccharide-23 10/15/2007, 11/12/2017   RSV,unspecified 09/13/2023   Td 10/15/2007, 10/15/2017   Tdap 10/29/2019   Zoster Recombinant(Shingrix) 10/29/2019, 01/27/2020, 07/14/2022    Screening Tests Health Maintenance  Topic Date Due   COVID-19 Vaccine (4 - 2025-26 season) 07/27/2024   Mammogram  05/22/2025   Medicare Annual Wellness (AWV)  09/10/2025   DTaP/Tdap/Td (4 - Td or Tdap) 10/28/2029   Pneumococcal Vaccine: 50+ Years  Completed   Influenza Vaccine  Completed   DEXA SCAN  Completed   Zoster Vaccines- Shingrix  Completed   Meningococcal B Vaccine  Aged Out   Hepatitis C Screening  Discontinued    Health Maintenance Items Addressed:   Additional Screening:  Vision Screening: Recommended annual ophthalmology exams for early detection of glaucoma and other disorders of the eye. Is the patient up to date with their annual eye exam?  Yes  Who is the provider or what is the name of the office in which the patient attends annual eye exams? [facility]  Dental Screening: Recommended annual dental exams for proper oral hygiene  Community Resource Referral / Chronic Care Management: CRR required this visit?  No   CCM required this visit?  No   Plan:    I have personally reviewed and noted the following in the patient's chart:   Medical and social history Use of alcohol, tobacco or illicit drugs  Current medications and supplements including opioid prescriptions. Patient is not currently taking opioid prescriptions. Functional ability and status Nutritional status Physical activity Advanced directives List of other physicians Hospitalizations, surgeries, and ER visits in previous 12 months Vitals Screenings to include cognitive, depression, and falls Referrals and  appointments  In addition, I have reviewed and discussed with patient certain preventive protocols, quality metrics, and best practice recommendations. A written personalized care plan for preventive services as well as general preventive health recommendations were provided to patient.   Rojelio LELON Blush, LPN   [id number]/83/[id number]   After Visit Summary: (MyChart) Due to this being a telephonic visit, the after visit summary with patients personalized plan was offered to patient via MyChart    Notes:  Nothing significant to report at this time.

## 2024-10-09 ENCOUNTER — Other Ambulatory Visit: Payer: Self-pay | Admitting: Internal Medicine

## 2024-10-09 NOTE — Telephone Encounter (Unsigned)
 Copied from CRM #[id number]. Topic: Clinical - Prescription Issue >> Oct 09, 2024 11:40 AM Emylou G wrote: Reason for CRM: Patient called.. said rivaroxaban  (XARELTO ) 20 MG TABS tablet Was submitted but she only wants one instead of 3 so she isnt charged 300.SABRA Pls review

## 2024-11-06 ENCOUNTER — Ambulatory Visit: Admitting: Family Medicine

## 2024-12-02 ENCOUNTER — Other Ambulatory Visit: Payer: Self-pay | Admitting: Internal Medicine

## 2024-12-09 ENCOUNTER — Ambulatory Visit (INDEPENDENT_AMBULATORY_CARE_PROVIDER_SITE_OTHER): Admitting: Internal Medicine

## 2024-12-09 ENCOUNTER — Encounter: Payer: Self-pay | Admitting: Internal Medicine

## 2024-12-09 VITALS — BP 122/80 | HR 91 | Temp 98.1°F | Resp 18 | Ht 63.0 in | Wt 192.4 lb

## 2024-12-09 DIAGNOSIS — H348192 Central retinal vein occlusion, unspecified eye, stable: Secondary | ICD-10-CM | POA: Diagnosis not present

## 2024-12-09 DIAGNOSIS — I480 Paroxysmal atrial fibrillation: Secondary | ICD-10-CM | POA: Diagnosis not present

## 2024-12-09 DIAGNOSIS — R739 Hyperglycemia, unspecified: Secondary | ICD-10-CM | POA: Diagnosis not present

## 2024-12-09 DIAGNOSIS — I10 Essential (primary) hypertension: Secondary | ICD-10-CM

## 2024-12-09 DIAGNOSIS — E785 Hyperlipidemia, unspecified: Secondary | ICD-10-CM | POA: Diagnosis not present

## 2024-12-09 DIAGNOSIS — E038 Other specified hypothyroidism: Secondary | ICD-10-CM | POA: Diagnosis not present

## 2024-12-09 DIAGNOSIS — Z0001 Encounter for general adult medical examination with abnormal findings: Secondary | ICD-10-CM | POA: Diagnosis not present

## 2024-12-09 LAB — BASIC METABOLIC PANEL WITH GFR
BUN: 20 mg/dL (ref 6–23)
CO2: 31 meq/L (ref 19–32)
Calcium: 9.1 mg/dL (ref 8.4–10.5)
Chloride: 103 meq/L (ref 96–112)
Creatinine, Ser: 0.83 mg/dL (ref 0.40–1.20)
GFR: 65.11 mL/min
Glucose, Bld: 115 mg/dL — ABNORMAL HIGH (ref 70–99)
Potassium: 4.4 meq/L (ref 3.5–5.1)
Sodium: 139 meq/L (ref 135–145)

## 2024-12-09 LAB — HEMOGLOBIN A1C: Hgb A1c MFr Bld: 5.8 % (ref 4.6–6.5)

## 2024-12-09 LAB — TSH: TSH: 2.15 u[IU]/mL (ref 0.35–5.50)

## 2024-12-09 NOTE — Patient Instructions (Signed)
 Please read your instructions carefully.   GO TO THE LAB :  Get the blood work    Go to the front desk for the checkout Please make an appointment to discuss Zepbound next month. Otherwise come back in 4 months   See your ophthalmologist as recommended. If you need any referrals let me know  Continue checking your blood pressure regularly Blood pressure goal:  between 110/65 and  130/80. If it is consistently higher or lower, let me know  Vaccines I recommend: COVID booster

## 2024-12-09 NOTE — Assessment & Plan Note (Signed)
 Here for CPX - T d 2018  - PNM 23 2008, 2018 ; Prevnar 2015.  PNM 20: 2023;  zostavax 2012,  shingrix x2 ;  s/p  RSV     - had a Flu shot   - Vaccines I recommend: COVID booster -Female care:  sees gyn Dr KANDICE. [place]  H/o breast ca, last MMG  04-2024 ([facility]) -[facility]: Cscope @ [facility] 01-2008 : 2 polyps ----> tubular adenomas w/  low grade dysplasia Cscope 08/2010, Dr Abran, very small polyps Last Cscope 08-2016: Adenomatous polyps, no follow-up due to age per GI letter  --Labs: BMP, A1c, TSH -Diet and exercise discussed -Advance care planning : documents on file

## 2024-12-09 NOTE — Progress Notes (Signed)
 "  Subjective:    Patient ID: Lori Joseph, female    DOB: [birth year], 84 y.o.   MRN: [id number]  DOS:  12/09/2024 CPX  Discussed the use of AI scribe software for clinical note transcription with the patient, who gave verbal consent to proceed.  History of Present Illness Ocular disorders - Glaucoma managed with topical eye drops - Retinal vein occlusion with right-eye hemorrhages - Bilateral cataracts - No vision loss  Atrial fibrillation and anticoagulation - Atrial fibrillation managed with Xarelto  - Ophthalmology aware of anticoagulation in context of retinal vein occlusion and ocular hemorrhages  Breast cancer surveillance - History of breast cancer, currently in remission - Discharged from oncology - Annual mammograms planned - Referral to gynecology for ongoing care  Osteopenia - Takes vitamin D  supplementation - Last bone density scan in 2022  Constitutional and systemic symptoms - No chest pain, shortness of breath, palpitations, hematuria, melena, hematochezia, abdominal pain, or dysuria    Review of Systems  Other than above, a 14 point review of systems is negative     Past Medical History:  Diagnosis Date   Anal condyloma Warts    Complication of anesthesia    slow to awaken after colonscopy yrs ago   Eczema    hands/legs   Family history of anesthesia complication    daughter hard to wake up   Glaucoma    H/O acute pancreatitis    yrs ago   H/O retinal detachment    History of Anemia    History of COVID-19 10/2021   congestion, diarrhea, flu like symptoms x 2 weeks al symptoms resolved   History of external beam radiation therapy    11-11-2015  to  12-30-2014   left breast 50.4 gray, lumpectomy cavity boosted to 62.4 gray   Hurthle cell neoplasm of thyroid     Right  us  thryoid 10-26-2021 epic, biopsy done 12-20-2021   Hyperlipidemia    Hypertension    Hypothyroidism 05/31/2015   Osteopenia    PAF (paroxysmal atrial fibrillation) (HCC)  03/2017   pressure increased in both eyes not glaucoma    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   Seasonal allergies    Vitamin D  deficiency    Wears partial dentures upper and lower     Past Surgical History:  Procedure Laterality Date   BREAST BIOPSY Left 08/27/2014   BREAST BIOPSY Left 11/27/2016   BREAST LUMPECTOMY Left 09/15/2014   CHOLECYSTECTOMY  2000   colonscopy  2018   DILATION AND CURETTAGE OF UTERUS     yrs ago   LASER ABLATION CONDOLAMATA N/A 12/22/2021   Procedure: LASER REMOVAL/ABLATION CONDOLAMATA;  Surgeon: Sheldon Standing, MD;  Location: [facility];  Service: General;  Laterality: N/A;   laser eye surgery, detached retina Left    yrs ago   RADIOACTIVE SEED GUIDED PARTIAL MASTECTOMY WITH AXILLARY SENTINEL LYMPH NODE BIOPSY Left 09/16/2014   Procedure: RADIOACTIVE SEED GUIDED PARTIAL MASTECTOMY WITH AXILLARY SENTINEL LYMPH NODE BIOPSY;  Surgeon: Jina Nephew, MD;  Location: [facility];  Service: General;  Laterality: Left;   RECTAL EXAM UNDER ANESTHESIA N/A 12/22/2021   Procedure: ANORECTAL EXAM UNDER ANESTHESIA excision of perirectal mass  Hemorrhoidal ligation and removal;  Surgeon: Sheldon Standing, MD;  Location: [facility] [place];  Service: General;  Laterality: N/A;   THYROID  LOBECTOMY N/A 02/10/2015   Procedure: RIGHT  THYROID  LOBECTOMY;  Surgeon: Jina Nephew, MD;  Location: [facility];  Service: General;  Laterality: N/A;   TONSILLECTOMY     age 40 adenoids remved also   TUBAL LIGATION     yrs ago    Current Outpatient Medications  Medication Instructions   albuterol  (VENTOLIN  HFA) 108 (90 Base) MCG/ACT inhaler 1-2 puffs, Inhalation, Every 6 hours PRN   calcium  carbonate 2,500 mg, Every morning   cholecalciferol  (VITAMIN D ) 2,500 Units, Every morning   dorzolamide -timolol  (COSOPT ) 22.3-6.8 MG/ML ophthalmic solution 1 drop, Every  morning   hydrochlorothiazide  (HYDRODIURIL ) 12.5 mg, Oral, Daily   latanoprost  (XALATAN ) 0.005 % ophthalmic solution PLACE 1 DROP INTO BOTH EYES NIGHTLY.   levothyroxine  (SYNTHROID ) 50 mcg, Oral, Daily before breakfast   MAGNESIUM  GLYCINATE PO Take by mouth.   Melatonin 10 MG TABS Daily at bedtime   metoprolol  succinate (TOPROL -XL) 50 mg, Oral, Daily, Take with or immediately following a meal   Multiple Vitamin (MULTIVITAMIN) tablet 1 tablet, Every morning   pravastatin  (PRAVACHOL ) 40 mg, Oral, Daily   rivaroxaban  (XARELTO ) 20 mg, Oral, Daily with supper       Objective:   Physical Exam BP 122/80   Pulse 91   Temp 98.1 F (36.7 C) (Oral)   Resp 18   Ht 5' 3 (1.6 m)   Wt 192 lb 6 oz (87.3 kg)   LMP  (LMP Unknown)   SpO2 95%   BMI 34.08 kg/m  General: Well developed, NAD, BMI noted Neck: No  thyromegaly  HEENT:  Normocephalic . Face symmetric, atraumatic Lungs:  CTA B Normal respiratory effort, no intercostal retractions, no accessory muscle use. Heart: Irregularly irregular.  Abdomen:  Not distended, soft, non-tender. No rebound or rigidity.   Lower extremities: no pretibial edema bilaterally  Skin: Exposed areas without rash. Not pale. Not jaundice Neurologic:  alert & oriented X3.  Speech normal, gait appropriate for age and unassisted Strength symmetric and appropriate for age.  Psych: Cognition and judgment appear intact.  Cooperative with normal attention span and concentration.  Behavior appropriate. No anxious or depressed appearing.     Assessment   Assessment  Hyperglycemia (a1c= 6.0 11.2019) HTN Hyperlipidemia Hypothyroidism Osteopenia: T score 2010 normal,  T score 05-2012 (-1.2), T score 03/2018 -1.1; Tscore -1.1 (2022) on calcium  and vitamin D  Obesity Insomnia: INTOLERANT to zolpidem  CV: -A. fib, new onset 03-2017 -Low risk stress test 04-2017 Oncology: --Breast cancer, left, dx  2015, s/p XRT 2016, on Arimidex  x 5 years; Oncology release  her --Hurthle Cell neoplasm of the thyroid , R thyroidectomy lobe  01-2015 SKIN: ---Eczema ---[facility] 2012  ANEMIA- saw hematology, rx IV iron 10-2015 Chronic right-sided chest pain:  x-rays CT abdomen and pelvis 2016 negative, saw pain mngmt, ortho ---> better with Lidoderm  patch and a local injection in the back Glaucoma   H/o acute pancreatitis H/o retinal  detachment Pulmonary: Abnormal chest x-ray and CT chest: Last CT chest 01-2016: See report. CXR 03/27/2016: No acute. Pulmonary: f/u prn ++ FH Lung Ca (pt is not a smoker)  PLAN: Here for CPX - T d 2018  - PNM 23 2008, 2018 ; Prevnar 2015.  PNM 20: 2023;  zostavax 2012,  shingrix x2 ;  s/p  RSV     - had a Flu shot   - Vaccines I recommend: COVID booster -Female care:  sees gyn Dr KANDICE. [place]  H/o breast ca, last MMG  04-2024 ([facility]) -[facility]: Cscope @ [facility] 01-2008 : 2 polyps ----> tubular adenomas w/  low grade dysplasia Cscope 08/2010, Dr Abran, very small polyps Last Cscope 08-2016: Adenomatous polyps, no follow-up due to age per GI letter  --Labs: BMP, A1c, TSH -Diet and exercise discussed -Advance care planning : documents on file   Other issues: Hyperglycemia: Check A1c Obesity: BMI 34, interested on GLP-1's, recommend to come back next months to discuss. Retinal vein occlusion: Recently diagnosed, strongly recommend to follow-up the advice provided by ophthalmology.   Advised to share with them all her medical problems and the fact that she is taking anticoagulants (patient states she did) History of breast cancer: Released by oncology, next mammogram 04/2025.  Patient is aware. PAF: On Xarelto , metoprolol , LOV cardiology 07/30/2024.  No changes made.  Last CBC normal.  No GI symptoms or unusual bleeding. HTN: On metoprolol , HCTZ, reports good ambulatory BPs, check a BMP Hypothyroidism: On Synthroid .  Check a TSH High cholesterol: On pravastatin , last LDL 103, no changes made. RTC next month to discuss GLP-1 otherwise RTC 4  months "

## 2024-12-09 NOTE — Assessment & Plan Note (Signed)
 Here for CPX  Other issues: Hyperglycemia: Check A1c Obesity: BMI 34, interested on GLP-1's, recommend to come back next months to discuss. Retinal vein occlusion: Recently diagnosed, strongly recommend to follow-up the advice provided by ophthalmology.   Advised to share with them all her medical problems and the fact that she is taking anticoagulants (patient states she did) History of breast cancer: Released by oncology, next mammogram 04/2025.  Patient is aware. PAF: On Xarelto , metoprolol , LOV cardiology 07/30/2024.  No changes made.  Last CBC normal.  No GI symptoms or unusual bleeding. HTN: On metoprolol , HCTZ, reports good ambulatory BPs, check a BMP Hypothyroidism: On Synthroid .  Check a TSH High cholesterol: On pravastatin , last LDL 103, no changes made. RTC next month to discuss GLP-1 otherwise RTC 4 months

## 2024-12-11 ENCOUNTER — Ambulatory Visit: Payer: Self-pay | Admitting: Internal Medicine

## 2025-01-18 ENCOUNTER — Ambulatory Visit: Admitting: Internal Medicine

## 2025-09-16 ENCOUNTER — Ambulatory Visit

## 2025-12-10 ENCOUNTER — Encounter: Admitting: Internal Medicine
